# Patient Record
Sex: Female | Born: 1935 | Race: White | Hispanic: No | Smoking: Former smoker
Health system: Southern US, Community
[De-identification: ages and names within clinical notes are randomized; demographics above are authoritative.]

## PROBLEM LIST (undated history)

## (undated) ENCOUNTER — Ambulatory Visit: Admission: EM

## (undated) DIAGNOSIS — H269 Unspecified cataract: Secondary | ICD-10-CM

## (undated) DIAGNOSIS — G709 Myoneural disorder, unspecified: Secondary | ICD-10-CM

## (undated) DIAGNOSIS — G8929 Other chronic pain: Secondary | ICD-10-CM

## (undated) DIAGNOSIS — Z8719 Personal history of other diseases of the digestive system: Secondary | ICD-10-CM

## (undated) DIAGNOSIS — M549 Dorsalgia, unspecified: Secondary | ICD-10-CM

## (undated) DIAGNOSIS — J302 Other seasonal allergic rhinitis: Secondary | ICD-10-CM

## (undated) DIAGNOSIS — I878 Other specified disorders of veins: Secondary | ICD-10-CM

## (undated) DIAGNOSIS — R0600 Dyspnea, unspecified: Secondary | ICD-10-CM

## (undated) DIAGNOSIS — Z8711 Personal history of peptic ulcer disease: Secondary | ICD-10-CM

## (undated) DIAGNOSIS — R1310 Dysphagia, unspecified: Secondary | ICD-10-CM

## (undated) DIAGNOSIS — B379 Candidiasis, unspecified: Secondary | ICD-10-CM

## (undated) DIAGNOSIS — R609 Edema, unspecified: Secondary | ICD-10-CM

## (undated) DIAGNOSIS — R600 Localized edema: Secondary | ICD-10-CM

## (undated) DIAGNOSIS — E039 Hypothyroidism, unspecified: Secondary | ICD-10-CM

## (undated) DIAGNOSIS — I10 Essential (primary) hypertension: Secondary | ICD-10-CM

## (undated) DIAGNOSIS — IMO0001 Reserved for inherently not codable concepts without codable children: Secondary | ICD-10-CM

## (undated) DIAGNOSIS — J439 Emphysema, unspecified: Secondary | ICD-10-CM

## (undated) DIAGNOSIS — M62838 Other muscle spasm: Secondary | ICD-10-CM

## (undated) DIAGNOSIS — I499 Cardiac arrhythmia, unspecified: Secondary | ICD-10-CM

## (undated) DIAGNOSIS — J45909 Unspecified asthma, uncomplicated: Secondary | ICD-10-CM

## (undated) DIAGNOSIS — M2550 Pain in unspecified joint: Secondary | ICD-10-CM

## (undated) DIAGNOSIS — M47817 Spondylosis without myelopathy or radiculopathy, lumbosacral region: Secondary | ICD-10-CM

## (undated) DIAGNOSIS — E7800 Pure hypercholesterolemia, unspecified: Secondary | ICD-10-CM

## (undated) DIAGNOSIS — I4891 Unspecified atrial fibrillation: Secondary | ICD-10-CM

## (undated) DIAGNOSIS — K589 Irritable bowel syndrome without diarrhea: Secondary | ICD-10-CM

## (undated) DIAGNOSIS — H9190 Unspecified hearing loss, unspecified ear: Secondary | ICD-10-CM

## (undated) DIAGNOSIS — K219 Gastro-esophageal reflux disease without esophagitis: Secondary | ICD-10-CM

## (undated) DIAGNOSIS — Z9289 Personal history of other medical treatment: Secondary | ICD-10-CM

## (undated) DIAGNOSIS — C439 Malignant melanoma of skin, unspecified: Secondary | ICD-10-CM

## (undated) DIAGNOSIS — D860 Sarcoidosis of lung: Secondary | ICD-10-CM

## (undated) DIAGNOSIS — J189 Pneumonia, unspecified organism: Secondary | ICD-10-CM

## (undated) DIAGNOSIS — M1990 Unspecified osteoarthritis, unspecified site: Secondary | ICD-10-CM

## (undated) HISTORY — PX: TONSILLECTOMY: SUR1361

## (undated) HISTORY — DX: Essential (primary) hypertension: I10

## (undated) HISTORY — PX: CHOLECYSTECTOMY: SHX55

## (undated) HISTORY — DX: Unspecified osteoarthritis, unspecified site: M19.90

## (undated) HISTORY — PX: COLONOSCOPY: SHX174

## (undated) HISTORY — PX: MELANOMA EXCISION: SHX5266

## (undated) HISTORY — PX: ESOPHAGOGASTRODUODENOSCOPY: SHX1529

## (undated) HISTORY — DX: Pure hypercholesterolemia, unspecified: E78.00

## (undated) HISTORY — PX: TOTAL HIP ARTHROPLASTY: SHX124

## (undated) HISTORY — DX: Sarcoidosis of lung: D86.0

## (undated) HISTORY — DX: Spondylosis without myelopathy or radiculopathy, lumbosacral region: M47.817

## (undated) HISTORY — PX: ROTATOR CUFF REPAIR: SHX139

## (undated) HISTORY — PX: OTHER SURGICAL HISTORY: SHX169

## (undated) HISTORY — DX: Malignant melanoma of skin, unspecified: C43.9

## (undated) HISTORY — DX: Unspecified atrial fibrillation: I48.91

---

## 1980-11-03 HISTORY — PX: ABDOMINAL HYSTERECTOMY: SHX81

## 1984-11-03 HISTORY — PX: APPENDECTOMY: SHX54

## 1994-11-03 HISTORY — PX: ROTATOR CUFF REPAIR: SHX139

## 1995-11-04 HISTORY — PX: BREAST BIOPSY: SHX20

## 1999-08-01 ENCOUNTER — Other Ambulatory Visit: Admission: RE | Admit: 1999-08-01 | Discharge: 1999-08-01 | Payer: Self-pay | Admitting: Obstetrics and Gynecology

## 1999-10-04 HISTORY — PX: LIPOMA EXCISION: SHX5283

## 2000-06-29 ENCOUNTER — Encounter: Payer: Self-pay | Admitting: Orthopedic Surgery

## 2000-06-29 ENCOUNTER — Ambulatory Visit (HOSPITAL_COMMUNITY): Admission: RE | Admit: 2000-06-29 | Discharge: 2000-06-29 | Payer: Self-pay | Admitting: Orthopedic Surgery

## 2000-08-05 ENCOUNTER — Other Ambulatory Visit: Admission: RE | Admit: 2000-08-05 | Discharge: 2000-08-05 | Payer: Self-pay | Admitting: Obstetrics and Gynecology

## 2000-10-03 HISTORY — PX: TOTAL HIP ARTHROPLASTY: SHX124

## 2000-10-14 ENCOUNTER — Inpatient Hospital Stay (HOSPITAL_COMMUNITY): Admission: RE | Admit: 2000-10-14 | Discharge: 2000-10-19 | Payer: Self-pay | Admitting: Orthopedic Surgery

## 2000-10-14 ENCOUNTER — Encounter: Payer: Self-pay | Admitting: Orthopedic Surgery

## 2000-11-05 ENCOUNTER — Ambulatory Visit (HOSPITAL_COMMUNITY): Admission: RE | Admit: 2000-11-05 | Discharge: 2000-11-05 | Payer: Self-pay | Admitting: Orthopedic Surgery

## 2001-02-12 ENCOUNTER — Encounter: Payer: Self-pay | Admitting: Orthopedic Surgery

## 2001-02-12 ENCOUNTER — Encounter: Admission: RE | Admit: 2001-02-12 | Discharge: 2001-02-12 | Payer: Self-pay | Admitting: Orthopedic Surgery

## 2001-04-26 ENCOUNTER — Encounter: Payer: Self-pay | Admitting: Internal Medicine

## 2001-04-26 ENCOUNTER — Encounter: Admission: RE | Admit: 2001-04-26 | Discharge: 2001-04-26 | Payer: Self-pay | Admitting: Internal Medicine

## 2001-04-29 ENCOUNTER — Encounter: Payer: Self-pay | Admitting: Internal Medicine

## 2001-04-29 ENCOUNTER — Encounter: Admission: RE | Admit: 2001-04-29 | Discharge: 2001-04-29 | Payer: Self-pay | Admitting: Internal Medicine

## 2001-05-03 DIAGNOSIS — D860 Sarcoidosis of lung: Secondary | ICD-10-CM

## 2001-05-03 HISTORY — PX: LUNG BIOPSY: SHX232

## 2001-05-03 HISTORY — DX: Sarcoidosis of lung: D86.0

## 2001-05-07 ENCOUNTER — Encounter: Payer: Self-pay | Admitting: Thoracic Surgery

## 2001-05-07 ENCOUNTER — Encounter (INDEPENDENT_AMBULATORY_CARE_PROVIDER_SITE_OTHER): Payer: Self-pay | Admitting: Specialist

## 2001-05-07 ENCOUNTER — Ambulatory Visit (HOSPITAL_COMMUNITY): Admission: RE | Admit: 2001-05-07 | Discharge: 2001-05-07 | Payer: Self-pay | Admitting: Thoracic Surgery

## 2001-06-01 ENCOUNTER — Encounter: Admission: RE | Admit: 2001-06-01 | Discharge: 2001-06-01 | Payer: Self-pay | Admitting: Thoracic Surgery

## 2001-06-01 ENCOUNTER — Encounter: Payer: Self-pay | Admitting: Thoracic Surgery

## 2001-08-03 ENCOUNTER — Encounter: Payer: Self-pay | Admitting: Thoracic Surgery

## 2001-08-03 ENCOUNTER — Encounter: Admission: RE | Admit: 2001-08-03 | Discharge: 2001-08-03 | Payer: Self-pay | Admitting: Thoracic Surgery

## 2001-08-09 ENCOUNTER — Other Ambulatory Visit: Admission: RE | Admit: 2001-08-09 | Discharge: 2001-08-09 | Payer: Self-pay | Admitting: Obstetrics and Gynecology

## 2001-08-30 ENCOUNTER — Encounter: Admission: RE | Admit: 2001-08-30 | Discharge: 2001-08-30 | Payer: Self-pay | Admitting: Internal Medicine

## 2001-08-30 ENCOUNTER — Encounter: Payer: Self-pay | Admitting: Internal Medicine

## 2001-09-23 ENCOUNTER — Encounter: Admission: RE | Admit: 2001-09-23 | Discharge: 2001-09-23 | Payer: Self-pay | Admitting: Internal Medicine

## 2001-09-23 ENCOUNTER — Encounter: Payer: Self-pay | Admitting: Internal Medicine

## 2001-11-02 ENCOUNTER — Encounter: Admission: RE | Admit: 2001-11-02 | Discharge: 2001-11-02 | Payer: Self-pay | Admitting: Thoracic Surgery

## 2001-11-02 ENCOUNTER — Encounter: Payer: Self-pay | Admitting: Thoracic Surgery

## 2002-01-27 ENCOUNTER — Encounter: Admission: RE | Admit: 2002-01-27 | Discharge: 2002-01-27 | Payer: Self-pay | Admitting: Thoracic Surgery

## 2002-01-27 ENCOUNTER — Encounter: Payer: Self-pay | Admitting: Thoracic Surgery

## 2002-04-29 ENCOUNTER — Encounter: Payer: Self-pay | Admitting: Orthopedic Surgery

## 2002-04-29 ENCOUNTER — Encounter: Admission: RE | Admit: 2002-04-29 | Discharge: 2002-04-29 | Payer: Self-pay | Admitting: Orthopedic Surgery

## 2002-06-28 ENCOUNTER — Ambulatory Visit (HOSPITAL_COMMUNITY): Admission: RE | Admit: 2002-06-28 | Discharge: 2002-06-28 | Payer: Self-pay | Admitting: Orthopedic Surgery

## 2002-06-28 ENCOUNTER — Encounter: Payer: Self-pay | Admitting: Orthopedic Surgery

## 2003-03-29 ENCOUNTER — Ambulatory Visit (HOSPITAL_COMMUNITY): Admission: RE | Admit: 2003-03-29 | Discharge: 2003-03-29 | Payer: Self-pay | Admitting: Orthopedic Surgery

## 2003-03-29 ENCOUNTER — Encounter: Payer: Self-pay | Admitting: Orthopedic Surgery

## 2003-08-08 ENCOUNTER — Encounter: Payer: Self-pay | Admitting: Internal Medicine

## 2003-08-08 ENCOUNTER — Encounter: Admission: RE | Admit: 2003-08-08 | Discharge: 2003-08-08 | Payer: Self-pay | Admitting: Internal Medicine

## 2003-11-04 HISTORY — PX: OTHER SURGICAL HISTORY: SHX169

## 2003-12-13 ENCOUNTER — Encounter: Admission: RE | Admit: 2003-12-13 | Discharge: 2003-12-13 | Payer: Self-pay | Admitting: Orthopedic Surgery

## 2005-04-14 ENCOUNTER — Encounter: Admission: RE | Admit: 2005-04-14 | Discharge: 2005-04-14 | Payer: Self-pay | Admitting: Orthopedic Surgery

## 2005-06-03 HISTORY — PX: TOTAL HIP ARTHROPLASTY: SHX124

## 2005-06-27 ENCOUNTER — Inpatient Hospital Stay (HOSPITAL_COMMUNITY): Admission: RE | Admit: 2005-06-27 | Discharge: 2005-07-02 | Payer: Self-pay | Admitting: Orthopedic Surgery

## 2006-01-13 ENCOUNTER — Encounter: Admission: RE | Admit: 2006-01-13 | Discharge: 2006-01-13 | Payer: Self-pay | Admitting: Neurological Surgery

## 2006-07-31 ENCOUNTER — Encounter: Admission: RE | Admit: 2006-07-31 | Discharge: 2006-07-31 | Payer: Self-pay | Admitting: Surgery

## 2006-12-02 ENCOUNTER — Encounter: Admission: RE | Admit: 2006-12-02 | Discharge: 2006-12-02 | Payer: Self-pay | Admitting: Orthopedic Surgery

## 2007-07-28 ENCOUNTER — Encounter: Admission: RE | Admit: 2007-07-28 | Discharge: 2007-07-28 | Payer: Self-pay | Admitting: Orthopedic Surgery

## 2008-03-17 ENCOUNTER — Encounter
Admission: RE | Admit: 2008-03-17 | Discharge: 2008-06-15 | Payer: Self-pay | Admitting: Physical Medicine & Rehabilitation

## 2008-03-21 ENCOUNTER — Ambulatory Visit: Payer: Self-pay | Admitting: Physical Medicine & Rehabilitation

## 2008-04-13 ENCOUNTER — Ambulatory Visit: Payer: Self-pay | Admitting: Physical Medicine & Rehabilitation

## 2008-05-11 ENCOUNTER — Ambulatory Visit: Payer: Self-pay | Admitting: Physical Medicine & Rehabilitation

## 2008-06-14 ENCOUNTER — Encounter
Admission: RE | Admit: 2008-06-14 | Discharge: 2008-09-12 | Payer: Self-pay | Admitting: Physical Medicine & Rehabilitation

## 2008-06-15 ENCOUNTER — Ambulatory Visit: Payer: Self-pay | Admitting: Physical Medicine & Rehabilitation

## 2008-07-13 ENCOUNTER — Ambulatory Visit: Payer: Self-pay | Admitting: Physical Medicine & Rehabilitation

## 2008-08-08 ENCOUNTER — Ambulatory Visit: Payer: Self-pay | Admitting: Physical Medicine & Rehabilitation

## 2008-09-05 ENCOUNTER — Ambulatory Visit: Payer: Self-pay | Admitting: Physical Medicine & Rehabilitation

## 2008-10-09 ENCOUNTER — Encounter
Admission: RE | Admit: 2008-10-09 | Discharge: 2009-01-07 | Payer: Self-pay | Admitting: Physical Medicine & Rehabilitation

## 2008-10-10 ENCOUNTER — Ambulatory Visit: Payer: Self-pay | Admitting: Physical Medicine & Rehabilitation

## 2008-11-07 ENCOUNTER — Ambulatory Visit: Payer: Self-pay | Admitting: Physical Medicine & Rehabilitation

## 2008-12-05 ENCOUNTER — Ambulatory Visit: Payer: Self-pay | Admitting: Physical Medicine & Rehabilitation

## 2009-01-08 ENCOUNTER — Encounter
Admission: RE | Admit: 2009-01-08 | Discharge: 2009-04-08 | Payer: Self-pay | Admitting: Physical Medicine & Rehabilitation

## 2009-01-09 ENCOUNTER — Ambulatory Visit: Payer: Self-pay | Admitting: Physical Medicine & Rehabilitation

## 2009-02-09 ENCOUNTER — Ambulatory Visit: Payer: Self-pay | Admitting: Physical Medicine & Rehabilitation

## 2009-03-05 ENCOUNTER — Ambulatory Visit: Payer: Self-pay | Admitting: Physical Medicine & Rehabilitation

## 2009-04-03 ENCOUNTER — Encounter
Admission: RE | Admit: 2009-04-03 | Discharge: 2009-07-02 | Payer: Self-pay | Admitting: Physical Medicine & Rehabilitation

## 2009-04-06 ENCOUNTER — Ambulatory Visit: Payer: Self-pay | Admitting: Physical Medicine & Rehabilitation

## 2009-05-17 ENCOUNTER — Ambulatory Visit: Payer: Self-pay | Admitting: Physical Medicine & Rehabilitation

## 2009-06-07 ENCOUNTER — Ambulatory Visit: Payer: Self-pay | Admitting: Physical Medicine & Rehabilitation

## 2009-07-06 ENCOUNTER — Encounter
Admission: RE | Admit: 2009-07-06 | Discharge: 2009-10-03 | Payer: Self-pay | Admitting: Physical Medicine & Rehabilitation

## 2009-07-10 ENCOUNTER — Ambulatory Visit: Payer: Self-pay | Admitting: Physical Medicine & Rehabilitation

## 2009-08-08 ENCOUNTER — Ambulatory Visit: Payer: Self-pay | Admitting: Physical Medicine & Rehabilitation

## 2009-09-05 ENCOUNTER — Ambulatory Visit: Payer: Self-pay | Admitting: Physical Medicine & Rehabilitation

## 2009-10-03 ENCOUNTER — Encounter
Admission: RE | Admit: 2009-10-03 | Discharge: 2009-10-31 | Payer: Self-pay | Admitting: Physical Medicine & Rehabilitation

## 2009-10-04 ENCOUNTER — Ambulatory Visit: Payer: Self-pay | Admitting: Physical Medicine & Rehabilitation

## 2009-10-31 ENCOUNTER — Ambulatory Visit: Payer: Self-pay | Admitting: Physical Medicine & Rehabilitation

## 2009-11-21 ENCOUNTER — Encounter
Admission: RE | Admit: 2009-11-21 | Discharge: 2010-02-19 | Payer: Self-pay | Admitting: Physical Medicine & Rehabilitation

## 2009-11-28 ENCOUNTER — Ambulatory Visit: Payer: Self-pay | Admitting: Physical Medicine & Rehabilitation

## 2009-12-27 ENCOUNTER — Ambulatory Visit: Payer: Self-pay | Admitting: Physical Medicine & Rehabilitation

## 2010-01-28 ENCOUNTER — Ambulatory Visit: Payer: Self-pay | Admitting: Physical Medicine & Rehabilitation

## 2010-02-20 ENCOUNTER — Encounter
Admission: RE | Admit: 2010-02-20 | Discharge: 2010-05-21 | Payer: Self-pay | Admitting: Physical Medicine & Rehabilitation

## 2010-02-27 ENCOUNTER — Ambulatory Visit: Payer: Self-pay | Admitting: Physical Medicine & Rehabilitation

## 2010-03-27 ENCOUNTER — Ambulatory Visit: Payer: Self-pay | Admitting: Physical Medicine & Rehabilitation

## 2010-04-12 ENCOUNTER — Ambulatory Visit: Payer: Self-pay | Admitting: Physical Medicine & Rehabilitation

## 2010-05-07 ENCOUNTER — Ambulatory Visit: Payer: Self-pay | Admitting: Physical Medicine & Rehabilitation

## 2010-05-15 ENCOUNTER — Encounter
Admission: RE | Admit: 2010-05-15 | Discharge: 2010-05-15 | Payer: Self-pay | Admitting: Physical Medicine & Rehabilitation

## 2010-06-05 ENCOUNTER — Encounter
Admission: RE | Admit: 2010-06-05 | Discharge: 2010-09-03 | Payer: Self-pay | Admitting: Physical Medicine & Rehabilitation

## 2010-06-10 ENCOUNTER — Ambulatory Visit: Payer: Self-pay | Admitting: Physical Medicine & Rehabilitation

## 2010-07-12 ENCOUNTER — Ambulatory Visit: Payer: Self-pay | Admitting: Physical Medicine & Rehabilitation

## 2010-08-12 ENCOUNTER — Ambulatory Visit: Payer: Self-pay | Admitting: Physical Medicine & Rehabilitation

## 2010-09-18 ENCOUNTER — Encounter
Admission: RE | Admit: 2010-09-18 | Discharge: 2010-10-31 | Payer: Self-pay | Source: Home / Self Care | Attending: Physical Medicine & Rehabilitation | Admitting: Physical Medicine & Rehabilitation

## 2010-09-24 ENCOUNTER — Ambulatory Visit: Payer: Self-pay | Admitting: Physical Medicine & Rehabilitation

## 2010-11-06 ENCOUNTER — Encounter
Admission: RE | Admit: 2010-11-06 | Discharge: 2010-12-03 | Payer: Self-pay | Source: Home / Self Care | Attending: Physical Medicine & Rehabilitation | Admitting: Physical Medicine & Rehabilitation

## 2010-11-08 ENCOUNTER — Ambulatory Visit
Admission: RE | Admit: 2010-11-08 | Discharge: 2010-11-08 | Payer: Self-pay | Source: Home / Self Care | Attending: Physical Medicine & Rehabilitation | Admitting: Physical Medicine & Rehabilitation

## 2010-12-02 ENCOUNTER — Ambulatory Visit
Admission: RE | Admit: 2010-12-02 | Discharge: 2010-12-02 | Payer: Self-pay | Source: Home / Self Care | Attending: Physical Medicine & Rehabilitation | Admitting: Physical Medicine & Rehabilitation

## 2010-12-31 ENCOUNTER — Encounter: Payer: MEDICARE | Attending: Physical Medicine & Rehabilitation

## 2010-12-31 ENCOUNTER — Ambulatory Visit (HOSPITAL_BASED_OUTPATIENT_CLINIC_OR_DEPARTMENT_OTHER): Payer: MEDICARE | Admitting: Physical Medicine & Rehabilitation

## 2010-12-31 DIAGNOSIS — M67919 Unspecified disorder of synovium and tendon, unspecified shoulder: Secondary | ICD-10-CM | POA: Insufficient documentation

## 2010-12-31 DIAGNOSIS — IMO0002 Reserved for concepts with insufficient information to code with codable children: Secondary | ICD-10-CM | POA: Insufficient documentation

## 2010-12-31 DIAGNOSIS — M47817 Spondylosis without myelopathy or radiculopathy, lumbosacral region: Secondary | ICD-10-CM | POA: Insufficient documentation

## 2010-12-31 DIAGNOSIS — M25519 Pain in unspecified shoulder: Secondary | ICD-10-CM | POA: Insufficient documentation

## 2010-12-31 DIAGNOSIS — M719 Bursopathy, unspecified: Secondary | ICD-10-CM

## 2010-12-31 DIAGNOSIS — M48061 Spinal stenosis, lumbar region without neurogenic claudication: Secondary | ICD-10-CM

## 2011-02-13 ENCOUNTER — Encounter: Payer: MEDICARE | Attending: Physical Medicine & Rehabilitation

## 2011-02-13 ENCOUNTER — Ambulatory Visit (HOSPITAL_BASED_OUTPATIENT_CLINIC_OR_DEPARTMENT_OTHER): Payer: MEDICARE | Admitting: Physical Medicine & Rehabilitation

## 2011-02-13 DIAGNOSIS — M67919 Unspecified disorder of synovium and tendon, unspecified shoulder: Secondary | ICD-10-CM | POA: Insufficient documentation

## 2011-02-13 DIAGNOSIS — M719 Bursopathy, unspecified: Secondary | ICD-10-CM | POA: Insufficient documentation

## 2011-02-13 DIAGNOSIS — M47817 Spondylosis without myelopathy or radiculopathy, lumbosacral region: Secondary | ICD-10-CM | POA: Insufficient documentation

## 2011-02-13 DIAGNOSIS — M25519 Pain in unspecified shoulder: Secondary | ICD-10-CM | POA: Insufficient documentation

## 2011-02-13 DIAGNOSIS — IMO0002 Reserved for concepts with insufficient information to code with codable children: Secondary | ICD-10-CM | POA: Insufficient documentation

## 2011-02-13 DIAGNOSIS — M19029 Primary osteoarthritis, unspecified elbow: Secondary | ICD-10-CM

## 2011-02-27 NOTE — Procedures (Signed)
NAMEBRIZZA, Jenna [name]              ACCOUNT NO.:  [id number]  MEDICAL RECORD NO.:  [id number]           PATIENT TYPE:  O  LOCATION:  [facility]                          FACILITY:  [facility]  PHYSICIAN:  Erick Colace, M.D.DATE OF BIRTH:  02/25/1933  DATE OF PROCEDURE: DATE OF DISCHARGE:                              OPERATIVE REPORT  PROCEDURE:  Left intra-articular injection under ultrasound guidance.  INDICATION:  Left shoulder OA.  She has a history of multiple left shoulder procedures with prior rotator cuff repair as well as acromioclavicular resection.  Her pain persists despite medication management including narcotic analgesics.  She has had variable success with shoulder injections in the past.  Informed consent was obtained after describing risks and benefits of the procedure with the patient.  These include bleeding, bruising and infection.  She elects to proceed and has given written consent.  She placed in a seated position.  Posterior lateral approach utilized. Area inferior to the spinous scapula was scanned laterally.  Glenohumeral joint identified.  Area marked and prepped with chlorhexidine.  Then entered with a 22-gauge echo block needle under continuous ultrasound guidance, 1% lidocaine infiltrated into the skin and subcu tissues in route to joint space.  Once needle approximated joint in a oblique view, 1 mL of 40 mg/mL Depo-Medrol and 4 mL of 1% lidocaine were injected. The patient tolerated the procedure well.  Postprocedure instructions given and she will follow up in 3 weeks.     Erick Colace, M.D. Electronically Signed    AEK/MEDQ  D:  02/13/2011 13:17:37  T:  02/14/2011 00:23:56  Job:  [id number]  cc:   Dyke Brackett, M.D. Fax: [phone]

## 2011-03-18 ENCOUNTER — Ambulatory Visit: Payer: MEDICARE | Admitting: Physical Medicine & Rehabilitation

## 2011-03-18 ENCOUNTER — Encounter: Payer: MEDICARE | Attending: Physical Medicine & Rehabilitation

## 2011-03-18 DIAGNOSIS — G8928 Other chronic postprocedural pain: Secondary | ICD-10-CM

## 2011-03-18 DIAGNOSIS — M67919 Unspecified disorder of synovium and tendon, unspecified shoulder: Secondary | ICD-10-CM | POA: Insufficient documentation

## 2011-03-18 DIAGNOSIS — M719 Bursopathy, unspecified: Secondary | ICD-10-CM | POA: Insufficient documentation

## 2011-03-18 DIAGNOSIS — M48061 Spinal stenosis, lumbar region without neurogenic claudication: Secondary | ICD-10-CM

## 2011-03-18 DIAGNOSIS — M25519 Pain in unspecified shoulder: Secondary | ICD-10-CM | POA: Insufficient documentation

## 2011-03-18 DIAGNOSIS — M47817 Spondylosis without myelopathy or radiculopathy, lumbosacral region: Secondary | ICD-10-CM | POA: Insufficient documentation

## 2011-03-18 DIAGNOSIS — IMO0002 Reserved for concepts with insufficient information to code with codable children: Secondary | ICD-10-CM | POA: Insufficient documentation

## 2011-03-18 NOTE — Assessment & Plan Note (Signed)
A 77 year old female last seen by me on August 08, 2008.  She has a  history of lumbar spondylosis without myelopathy with facet syndrome in  the lumbar area.  She had extended relief after bilateral lumbar medial  branch blocks.  She has improved with her overall activity tolerance.  She has had pain around 4/10 for the most part over the last month, but  had a couple of spikes in the 6/10 range  about 2 days, where she  attributes it to weather.  She is independent with all her self-care and  mobility.  Her Oswestry disability index today was 28%.   Her pain level with activity is around 3/10.  In terms of interference  scores, I believe it remains good.  She can walk 20 minutes at a time.  She can climb steps.  She drives.  She is retired.   PHYSICAL EXAMINATION:  VITAL SIGNS:  Her blood pressure is 129/56, pulse  82, respiratory rate 20, O2 sat 98% on room air.  GENERAL:  Well-developed, well-nourished female in no acute distress.  Orientation x3.  Affect is alert.  Gait is normal.  EXTREMITIES:  Without edema.  She has normal strength in bilateral hip  flexion, knee extension, and ankle dorsiflexion.  She has good range of  motion in bilateral lower extremities.  Deep tendon reflexes are 1+ in  bilateral lower extremities.  She has some mild tenderness to palpation  of lumbar paraspinals.  She has good lumbar range of motion.  Forward  flexion and extension gets to about 50% range and mild pain with  extension end range.   IMPRESSION:  1. Lumbar spondylosis without myelopathy.  She has a history of      prolonged efficacy with medial branch blocks.  She has been on a      stable dose of Demerol 50 mg t.i.d. without signs of narcotic abuse      or misuse.  Her activity level has improved overall after      treatment.   PLAN:  We will go ahead and see her back in another month.  Difficult to  predict how long the medical branch blocks will work, they are actually  exceeding usual  duration of the fact.   Monitor signs and symptoms of substance abuse or misuse.      Erick Colace, M.D.  Electronically Signed     AEK/MedQ  D:  09/05/2008 13:40:55  T:  09/06/2008 02:15:58  Job #:  [id number]   cc:   Dyke Brackett, M.D.  Fax: [phone]

## 2011-03-18 NOTE — Procedures (Signed)
Jenna [name], Jenna [name]              ACCOUNT NO.:  [id number]   MEDICAL RECORD NO.:  [id number]          PATIENT TYPE:  REC   LOCATION:  [facility]                          FACILITY:  [facility]   PHYSICIAN:  Erick Colace, M.D.DATE OF BIRTH:  Jul 04, 1933   DATE OF PROCEDURE:  05/11/2008  DATE OF DISCHARGE:                               OPERATIVE REPORT   PROCEDURE:  Bilateral L5 dorsal ramus injection, bilateral L4 medial  branch block, and bilateral L3 medial branch block under lumbar and  sacral fluoroscopic guidance.   INDICATION:  Lumbar spondylosis without myelopathy, axial back pain  which is only partially responsive to narcotic analgesics and limits her  self-care and mobility.   Last injection April 13, 2008, gave a 100% relief of pain x1 week.  Pain  does increase during the day from a moderate level to a moderately  severe level.   Informed consent obtained after describing risks and benefits of the  procedure with the patient include bleeding, bruising, infection, loss  of bowel or bladder function, temporary or permanent paralysis.  She  elects to proceed and has given written consent.  The patient placed  prone on fluoroscopy table.  Betadine prep, sterile drape, 25-gauge a  inch and half needle was used to anesthetize the skin and subcu tissue  with 1% lidocaine x2 mL at each of 6 sites.  Then, a 22-gauge 3.5 inch  spinal needle was inserted first targeting the left S1, SAP, sacral ala  junction.  Bone contact made confirmed with lateral imaging.  Omnipaque  180 x 0.5 mL demonstrated no intravascular uptake and 0.5 mL solution  containing 1 mL of 4 mg/mL dexamethasone and 2 mL of 2% MPF lidocaine.  In the left L5 transverse process, SAP junction targeted, bone contact  made confirmed with lateral imaging.  Omnipaque 180 x 0.5 mL  demonstrated no intravascular uptake and 0.5 mL of the dexamethasone  lidocaine solution was injected.  Then, the left L4 SAP transverse  process  junction targeted, bone contact made, confirmed with lateral  imaging.  Omnipaque 180 x 0.5 mL demonstrated no intravascular uptake  and 0.5 mL of dexamethasone lidocaine solution was injected.  The same  procedure was repeated at the corresponding levels on the right side  using the same needle, injection technique, and injection solution.  The  patient tolerated this procedure well.  Pre- and post-injection vital  stable.  Postinjection instructions given.  We will schedule  radiofrequency neurotomy pending results of medial branch block.  Prescription written for Demerol, Phenergan, as well as some p.r.n.  Flexeril.      Erick Colace, M.D.  Electronically Signed     AEK/MEDQ  D:  05/11/2008 13:53:36  T:  05/12/2008 02:39:43  Job:  [id number]

## 2011-03-18 NOTE — Assessment & Plan Note (Signed)
A 77 year old female last seen on July 13, 2008, history of lumbar  spondylosis without myelopathy with facet syndrome in the lumbar area.  She is having extended relief after bilateral L5 dorsal ramus injection  and bilateral L4 medial branch block and bilateral L3 medial branch  block under fluoroscopic guidance.  She has improved overall in her  activity tolerance.  She is having a bad day today.  Her usual pain is  in the 2-3 range, the worst she has had in the last month was 4/10.  She  is going on a treadmill 30-40 minutes at a time.  She is independent  with all of her self-care and mobility.   She is following up with Dr. Madelon Lips in regards to left shoulder pain.   VITAL SIGNS:  Her blood pressure is 145/66, pulse 84, respirations 20,  and O2 sat 98% on room air.  GENERAL:  No acute distress.  Orientation x3.  Affect is alert.  Gait is  normal.   She has tenderness to palpation bilateral L4 and L5 paraspinal muscles.  She has good forward flexion, but extension causes pain in the lumbar  area.  She has normal sensation and normal lower extremity deep tendon  reflexes.   Gait is normal.   IMPRESSION:  Lumbar spondylosis, myelopathy, prolonged response to  medial branch block about 3 months post injection.  I would imagine that  it starting wearing off soon.  I will see her back in another month or  so, monitor for signs of recurrence.      Erick Colace, M.D.  Electronically Signed     AEK/MedQ  D:  08/08/2008 14:31:06  T:  08/09/2008 03:41:49  Job #:  [id number]   cc:   Dyke Brackett, M.D.  Fax: [phone]

## 2011-03-18 NOTE — Assessment & Plan Note (Signed)
A 77 year old female last seen by me on September 05, 2008, with history  of lumbar spondylosis without myelopathy with facet syndrome of the  lumbar area.  She had extended relief after bilateral lumbar medial  branch blocks last performed in July 2009.  She has had some headache  pain as well as some neck pain on the right side.  She has some pain  when she tries to sleep.  Overall, she has charted her pain scores and  really staying in the 4-5/10 range and some days in the 3/10 range.   CURRENT MEDICATIONS:  Tramadol 50 mg p.o. t.i.d.   She has no signs of narcotic abuse or misuse.  The level of activity has  improved after treatment.   Her pain has flared up a bit with prolonged waking.  She has had some  left shoulder pain and has recently undergone shoulder injection per Dr.  Madelon Lips.  She is able to climb steps.  She is able to drive.   REVIEW OF SYSTEMS:  Otherwise negative.  She has no numbness or tingling  in the hands.  She has no bowel or bladder dysfunction.   PHYSICAL EXAMINATION:  VITAL SIGNS:  Blood pressure 132/80, pulse 82,  respiratory rate 18, and O2 sats 100% on room air.  GENERAL:  In no acute distress.  Orientation x3.  Affect is alert.  Gait  is normal.  EXTREMITIES:  Without edema.  Deep tendon reflexes are normal in the  upper extremities.  Normal strength in the upper extremities.  NECK:  Range of motion 75% range forward flexion and extension, 30%  range lateral bending and rotation.   She has mild tenderness to palpation of the cervical paraspinals  posteriorly.  Lumbar range of motion, there is mild tenderness above the  lumbosacral junction.   Lower extremity strength is normal.  Gait is normal.  Lumbar spine range  of motion is good in forward flexion, but reduced in extension to 25%  range.   IMPRESSION:  1. Lumbar spondylosis without myelopathy.  Prolonged effect of medial      branch blocks.  No need for re-injection at this point.  2. Neck  pain.  No signs of radicular component.  Continue Flexeril for      this on an intermittent basis.  Should this progress or cause more      functional disturbance, consider MRI and possible cervical medial      branch blocks.   Discussed with the patient and she is in agreement with this plan.  I  have written the prescription for her Demerol 30 mg p.o. t.i.d., #30,  for 68-month supply.  I will see her back in 1 month.      Erick Colace, M.D.  Electronically Signed     AEK/MedQ  D:  10/10/2008 13:32:56  T:  10/11/2008 01:03:02  Job #:  [id number]   cc:   Dyke Brackett, M.D.  Fax: [phone]

## 2011-03-18 NOTE — Assessment & Plan Note (Signed)
HISTORY OF PRESENT ILLNESS:  [name] follows up today.  She is here for  back pain.  She has had some recurrence of her back pain and thigh pain,  feels like her lumbar medial branch blocks are finally wearing off.  They were performed in July 2009.  She has had some left shoulder pain,  is considering some surgery, we will discuss this with Dr. Madelon Lips.   Her current pain meds include Demerol 50 p.o. t.i.d.  She takes this in  conjunction with Phenergan 25 t.i.d.   She has had no new medical problems in the interval time.  Her pain is  in the 5-6/10 range.  Her sleep is good.  Pain is worse with walking,  standing, improves with rest and medications.  Relief meds is fair.  She  can walk 20-30 minutes at a time.  She climbs steps.  She drives.   PHYSICAL EXAMINATION:  VITAL SIGNS:  Blood pressure 138/64, pulse 87,  respirations 18, O2 sat 98% room air.  GENERAL:  In no acute stress.  Orientation x3.  Affect is bright.  Gait  is normal.   IMPRESSION:  Lumbar spondylosis without myelopathy causing chronic pain,  having recurrence after a prolonged effect from medial branch block.  We  will schedule for repeat medial branch block.  Continue Demerol 50  t.i.d.      Erick Colace, M.D.  Electronically Signed     AEK/MedQ  D:  02/09/2009 17:17:01  T:  02/10/2009 08:12:52  Job #:  [id number]   cc:   Dyke Brackett, M.D.  Fax: [phone]

## 2011-03-18 NOTE — Assessment & Plan Note (Signed)
Ms. Galano follows up today.  She is here for back pain.  She has a  history of this.  She has no evidence of radicular component.  She has  had a prolonged effect after lumbar medial branch blocks performed in  July 2009.  She has had no change in her status other than some  exacerbation of pain in the similar area due to having to clean up her  mother-in-law's apartment after her mother-in-law passed away.  Her  average pain is 4/10, currently 2.  She can walk 30-40 minutes at a  time, climb steps and drives.   Current pain medicines include Demerol 50 t.i.d., which she has been on  without signs of abuse or misuse.   Her blood pressure is 136/78, pulse 80, respirations 18, and O2 sat 99%  on room air.  Orientation x3.  Affect bright.  Gait is normal.   Her back has some tenderness to palpation, but this is more in the T7-T8  region in the paraspinal muscles mainly on the left side.  Her lower  extremity strength is normal.  Deep tendon reflexes are normal.  Lower  extremity range of motion is normal.   IMPRESSION:  1. Lumbosacral spondylosis without myelopathy.  2. Left parascapular myofascial pain syndrome.   PLAN:  1. We will continue Demerol.  2. We will hold off on further injections, but we will reassess in 1      month given that I would expect her medial branch block started      wearing off.  3. Instructed in Thera-Band exercises for the upper extremity scapular      to stabilize the muscles.      Erick Colace, M.D.  Electronically Signed     AEK/MedQ  D:  01/09/2009 16:25:53  T:  01/10/2009 03:09:03  Job #:  [id number]

## 2011-03-18 NOTE — Assessment & Plan Note (Signed)
Jenna [name] returns today.  She underwent bilateral L5 dorsal ramus  injection, bilateral L4 medial branch block, bilateral L3 medial branch  block under fluoroscopic guidance for her lumbar facet mediated pain.  She had partial relief with this set of injections.  However, still  feels stiff in her low back.  Last year when she underwent injections,  she had monthly x2 and this resulted in a 7-8 months duration of effect.  Her average pain is about 4.  She can walk 30-40 minutes at a time.  She  climbs steps.  She drives.  She is retired.  Her 4/10 pain relief is  with medication.   Her current pain meds; Demerol 50 t.i.d. which she takes with Phenergan  25 b.i.d.   PHYSICAL EXAMINATION:  VITAL SIGNS:  Blood pressure 155/80, pulse 90,  respirations 16, and O2 sat 98% on room air.  GENERAL:  In no acute stress.  Mood and affect appropriate.  BACK:  Mild tenderness to palpation in lumbar paraspinals.  She has good  forward flexion, however, extension is limited to about 50% with normal  range accompanied by pain with extension.  Lower extremity strength and  range of motion are normal.  Deep tendon reflexes in lower extremities  are normal as is sensation.   IMPRESSION:  Lumbosacral spondylosis without myelopathy.  She has had  partial relief with medial branch blocks.  We will repeat x1.  She has  had a long duration of effect last year with a repeat injection.  Should  she not respond that she did last year, I would recommend radiofrequency  neurotomy first on the right than on the left side.      Erick Colace, M.D.  Electronically Signed     AEK/MedQ  D:  04/06/2009 15:21:27  T:  04/07/2009 09:25:58  Job #:  [id number]   cc:   Dyke Brackett, M.D.  Fax: [phone]

## 2011-03-18 NOTE — Assessment & Plan Note (Signed)
Ms. Delgrande is a 77 year old female who I last saw on June 15, 2008.  She  has a history of lumbar spondylosis without myelopathy.  She has a facet  syndrome in the lumbar area with relief that has been rather extended  after bilateral L5 dorsal ramus injections, bilateral L4 medial branch  block, and bilateral L3 medial branch block under fluoroscopic guidance.  Her last injection was done on May 11, 2008, and she has had continued  relief.  She has gone back to playing some golf, although this does  aggravate her pain.   Her medication usage is somewhat less than 3 times per day on the  Demerol 50 mg.   Her Oswestry disability index today is 26% which is in the moderate  disability range.   Her walking tolerance is 30-45 minutes.   PHYSICAL EXAMINATION:  GENERAL:  No acute distress.  Mood and affect  appropriate.  VITAL SIGNS:  Blood pressure 147/73, pulse 80, respiratory rate 18, and  O2 sat 98% on room air.  Her deep tendon reflexes are normal except for decreased left S1.  Sensory exam is normal in bilateral lower extremities.  Range of motion  is good in bilateral hips, knees, and ankles.  EXTREMITIES:  Without edema.   IMPRESSION:  Lumbar spondylosis without myelopathy, prolonged response  to medial branch block.  She is about 2 months since her last test.  We  will see her back in a month and monitor relief cycling.  We can  certainly repeat this again if need be.  Should she have increasing  lower extremity pain or sciatic type discomfort, we cannot repeat  epidural steroid injection.      Erick Colace, M.D.  Electronically Signed     AEK/MedQ  D:  07/13/2008 14:06:19  T:  07/14/2008 00:30:19  Job #:  [id number]   cc:   Dyke Brackett, M.D.  Fax: [phone]

## 2011-03-18 NOTE — Procedures (Signed)
Jenna [name], Jenna [name]              ACCOUNT NO.:  [id number]   MEDICAL RECORD NO.:  [id number]          PATIENT TYPE:  REC   LOCATION:  [facility]                          FACILITY:  [facility]   PHYSICIAN:  Erick Colace, M.D.DATE OF BIRTH:  [birth year]   DATE OF PROCEDURE:  03/05/2009  DATE OF DISCHARGE:                               OPERATIVE REPORT   This is bilateral L5 dorsal ramus injection, bilateral L4 medial branch  block, bilateral L3 medial branch block under fluoroscopic guidance.   INDICATIONS:  Lumbar facet mediated pain.  She has had good results with  prior medial branch blocks.  She has had long-lasting relief as well.  Her pain is only partially responsive to medication management including  narcotic analgesics and interferes with self-care.   Informed consent was obtained after describing risks and benefits of the  procedure with the patient.  These include bleeding, bruising,  infection.  She elects to proceed and has given written consent.  The  patient placed prone on fluoroscopy table.  Betadine prep, sterile drape  25-gauge inch and half needle was used to anesthetize the skin and  subcutaneous tissue 1% lidocaine x2 mL at each of six sites.  Then a 22  gauge 3.5 inch spinal needle was inserted under fluoroscopic guidance  first starting left S1 SAP sacral junction, bone contact made, confirmed  with lateral imaging.  Omnipaque 180 x 0.5 mL demonstrated no  intravascular uptake, then 0.5 mL of solution containing 1 mL 4 mg/mL  dexamethasone and 2 mL 2% MPF lidocaine were injected and left L5 SAP  transverse process junction targeted, bone contact made, confirmed with  lateral imaging.  Omnipaque 180 under live fluoro demonstrated no  intravascular uptake and 0.5 mL dexamethasone lidocaine solution was  injected at the left L4 SAP transverse process junction targeted, bone  contact made, confirmed with lateral imaging.  Omnipaque 180 under live  fluoro demonstrated  no intravascular uptake then 0.5 mL of dexamethasone  lidocaine solution was injected.  The same procedure was repeated on the  right side.  The same needle was injected and technique.  The patient  tolerated the procedure well.  Pre and post injection vitals stable.  Post injection instructions given.      Erick Colace, M.D.  Electronically Signed     AEK/MEDQ  D:  03/05/2009 17:16:16  T:  03/06/2009 07:23:16  Job:  [id number]

## 2011-03-18 NOTE — Group Therapy Note (Signed)
REQUESTING PHYSICIAN:  Dr. Madelon Lips.   REASON FOR CONSULTATION:  Left shoulder pain.   CHIEF COMPLAINT:  Back pain greater than shoulder pain greater than neck  pain.   HISTORY:  Jenna [name] is a pleasant 77 year old female who is a retired  Engineer, civil (consulting) who has had chronic pain in her shoulders as well as her low back  area. She has had orthopedic care and multiple operative procedures for  problems related to her rotator cuff. Despite interventions, she has had  persistent pain left greater than right. She has had hip pain as well,  but this has been well managed with bilateral total hip replacement. In  addition, she has had back pain, insidious onset, that she has had  evaluated by neurosurgery. She has had lumbar injections as well in  2005. She had a synovial cyst right L5-S1 facet. She had a MRI revealing  multi-level degenerative disease as well as advanced osteoarthritis, L5-  S1. She has had attempts at aspiration of the cyst. In terms of her  medications, she has multiple drug allergies and really has just  tolerated Demerol. She has been getting this through Dr. Candise Bowens  office. However, there was an episode where she had been getting some  through a dentist, Dr. Orvan Falconer and Dr. Halina Andreas earlier this year.   In terms of her shoulder pain, she has been given subdeltoid injections  every four months or so. She rates her average pain as 5/10, sharp,  intermittent aching activity at a moderate level. Her back and leg are  worse during the daytime. When she means by leg is actually bilateral  thighs. Shoulder problems bother her more at night when she tries to  sleep and affect her when she does not take her Demerol. Her sleep is  overall good with medications. Inactivity tends to aggravate some of her  back pain. Some extensor exercises have also been painful for her. She  has done well with pacer her activities, heat for the shoulder as well  as her medications, and resting for her  back. She can walk 30-40 minutes  on the treadmill. She climbs steps. She drives. She was last employed in  1984.   Her past surgical history:  1. Left rotator cuff repair October 09, 1980.  2. Arthroscopy left shoulder February 18, 1995.  3. Right rotator cuff repair July 29, 1995.  4. Left rotator cuff repair October 1998.  5. Left total hip October 09, 2008.  6. Total abdominal hysterectomy April 25, 1981.  7. Cholecystectomy and appendectomy June 04, 1985.   SOCIAL HISTORY:  Married to her husband, Mayford Knife. She drinks one glass  of wine with dinner.   FAMILY HISTORY:  Is heart disease, diabetes, high blood pressure.   CURRENT MEDICATIONS:  Include:  1. Levothyroxine.  2. Maxzide.  3. Protonix.  4. Singulair.  5. Calcium.  6. Multivitamin.  7. Aspirin 81 mg.  8. Vitamin D.  9. Estradiol patch.  10.Flonase.  11.Zyrtec.  12.Levsin.  13.Demerol 50 t.i.d.  14.Over-the-counter ibuprofen 200 mg that she takes up to 3 times a      day.   DRUG ALLERGIES:  INCLUDE CODEINE, DARVON, TYLENOL, VICODIN, ULTRAM, ALL  CAUSING SHORTNESS OF BREATH, WEAKNESS AND NAUSEA. DECONGESTANTS SUCH AS  SUDAFED CAUSE NERVOUSNESS. DALMANE CAUSED CONFUSION AND DISORIENTATION.  VIBRAMYCIN CAUSED A RASH. BIAXIN AND KEFLEX CAUSED GI UPSET.   Blood pressure 147/68, pulse 90, respirations 18, O2 saturation 97% on  room air.  Well-developed, well-nourished, elderly  female in no acute distress.  Orientation x3. Affect is bright and alert.  Gait is normal. She has approximately 90% of her shoulder abduction in  forward flexion, only some end-rage limitations. Her motor strength is  5/5 bilateral deltoid, biceps, triceps, grips, as well as hip flexors,  knee extensors, ankle dorsi flexors. Her deep tendon reflexes are normal  bilateral biceps, triceps, brachialis radialis, knee and ankle. Her  coordination is normal. Her extremities show no evidence of edema. She  has good peripheral pulses. Her neck  range of motion is normal. Spine  range of motion is reduced with extension to about 25% range, forward  flexion is 50% range, extension is more painful than flexion. Her back  has no evidence of scoliosis. She has normal tone and range of motion  bilateral upper and lower extremities. No tenderness to palpation  bilateral upper or lower extremities. She does have decreased sensation  left axillary nerve dermatome corresponding to the C6 spinal nerve root  level. Left side only.  The remainder of dermatomes in the upper and  lower extremities are normal.   REVIEW OF IMAGING STUDIES:  MRI last done, left shoulder:  Artifact  limited exam, possible full thickness insertional distal supraspinatus  tear with fatty atrophy. However, the patient does have a negative drop-  arm test. She had severe glenohumeral and AC joint osteoarthritis noted  as well as a subscapularis strain; this was performed July 28, 2007. On December 02, 2006, she had MRI of the right shoulder showing no  rotator cuff tear, chronic avulsion fracture of right greater tuberosity  - mildly displaced, moderately advanced glenohumeral and AC joint  degenerative changes. MRI of the lumbar spine dated January 13, 2006  revealed anterolisthesis L5 on S1 0.6 cm, broad-based disk bulge L2-3 on  the right, facet arthropathy, small facet joint effusion at this level,  mild narrowing at the right L2-3 foramen, left foramen open at L3-4.  There is advanced facet arthropathy, broad-based disk bulge, ligamentum  flavum thickening, facet arthropathy, bulging disk showing moderate to  severe right foraminal narrowing but no significant change compared to  prior study performed December 13, 2003. She also has anterolisthesis  with bulge at L4-5, severe facet arthropathy, moderate central canal  stenosis at L5-S1, severe facet arthropathy, ligamentum flavum  thickening with moderate severe central canal stenosis.   IMPRESSION:  1. Left  shoulder pain, multifactorial. She does have chronic rotator      cuff changes. In addition, she has some numbness over that area,      question whether she may have a neuropathic component. This could      be due to some cervical spinal stenosis versus some more of an      axillary neuropathy associated with multiple surgical procedures.      In either case, I think it is reasonable to trial Lyrica. She had      tried some Neurontin before but had some side effects with this.      Will start a very small dose. Need to monitor.  2. In terms of her low back, she basically has significant facet      arthropathy. May benefit from medial branch block. Discussed with      patient rationale. Will schedule.  May also consider optimizing her      home exercise program.   I will see her back for medial branch block. Will also check how she is  doing with the Lyrica  at that time. Will check urine drug screen and  make sure there are no disclosed opiates indicating multiple prescribers  given her prior history. Discussed this with patient. She does have a  new prescription for Demerol, so she should be good for at least a month  or so with what she has.   Thanks for this interesting consultation. Keep you apprised of her  progress.      Erick Colace, M.D.  Electronically Signed     AEK/MedQ  D:  03/21/2008 17:03:45  T:  03/21/2008 18:40:34  Job #:  [id number]   cc:   Dyke Brackett, M.D.  Fax: [phone]   [place], M.D.  Dentistry

## 2011-03-18 NOTE — Procedures (Signed)
NAMECYBIL, Jenna [name]              ACCOUNT NO.:  [id number]   MEDICAL RECORD NO.:  [id number]           PATIENT TYPE:   LOCATION:                                 FACILITY:   PHYSICIAN:  Erick Colace, M.D.DATE OF BIRTH:  1933-02-02   DATE OF PROCEDURE:  04/13/2008  DATE OF DISCHARGE:                               OPERATIVE REPORT   PROCEDURE:  Bilateral L5 dorsal ramus injection, bilateral L4 medial  branch block, and bilateral L3 medial branch block.   INDICATIONS:  Lumbar spondylosis without myelopathy, axial back pain.   Informed consent obtained after describing the risks and benefits of the  procedure with the patient.  These include bleeding, bruising,  infection, loss of bowel and bladder function, and temporary or  permanent paralysis.  She elects to proceed and has given written  consent.  The patient placed prone on fluoroscopy table.  Betadine prep,  sterile drape.  A 25-gauge 1-1/2 inch needle was used to anesthetize the  skin and subcutaneous tissue with 1% lidocaine of 2 mL and a 22-gauge 3-  1/2 inch spinal needle was inserted, first starting in the left S1 SAP  sacroiliac junction, bone contact made, confirmed with lateral imaging.  Omnipaque 180 x 0.5 mL demonstrated no intravascular uptake, then 0.5 mL  of normal solution containing 1 mL of 4 mL dexamethasone and 2 mL of 2%  MPF lidocaine.  Then the left L5 SAP transverse process junction target  bone contact made confirmed with lateral imaging.  Omnipaque 180 x 0.5  mL demonstrated no intravascular uptake and 0.5 mL of dexamethasone-  lidocaine solution injected.  Then the left L4 SAP transverse process  junction, target bone contact made, confirmed with lateral imaging.  Omnipaque 180 x 0.5 mL demonstrated no intravascular uptake and 0.5 mL  of dexamethasone-lidocaine solution was injected.  The same procedure  was repeated at the corresponding levels on the right side using the  same needle injecting  technique.  The patient tolerated the procedure  well.  Pre and post injection vitals stable.  Post injection  instructions given.  I will see her back in 1 month to repeat.   Pre-injection pain level 4/10, post injection 0/10.      Erick Colace, M.D.  Electronically Signed     AEK/MEDQ  D:  04/13/2008 14:29:31  T:  04/14/2008 05:38:45  Job:  [id number]

## 2011-03-18 NOTE — Assessment & Plan Note (Signed)
Jenna [name] returns today.  I last saw her approximately a month ago.  She  has a history of lumbar spondylosis without myelopathy with good and  prolonged effect from lumbar medial branch blocks around 6 months  duration, continues to have relief.  Her Demerol pill count was 26.  The  last prescription October 10, 2008.  She is appropriate with counts, in  fact prior taking a bit less than 3 times per day.  She could walk 30  minutes at times.  She climbs steps.  She drives.   CURRENT MEDICAL HISTORY:  Had a URI last couple of weeks, pulled a  muscle in her back coughing, but this has improved.  She points to an  area just next to her latissimus dorsi on the right side at the mid  axillary line.   REVIEW OF SYSTEMS:  Otherwise, negative.  Her Oswestry disability index  score today, 32%.   EXAMINATION:  GENERAL:  In no acute distress.  Mood and affect  appropriate.  Orientation is x3.  Gait is normal.  NEUROLOGIC:  She has no edema in the lower extremities.  She has normal  deep tendon reflexes bilateral upper and lower extremities.  Normal  strength in bilateral upper and lower extremities.  Her lumbar range of  motion, mild tenderness at the lumbosacral junction, but range of motion  is full.  Neck range of motion is full.   IMPRESSION:  1. Lumbar spondylosis without myelopathy, prolonged effect medial      branch block, no need for any injection at this time.  2. Neck pain.  Likely cervical spondylosis, but certainly no radicular      component.  No need for further workup at this time.   PLAN:  We will continue Demerol 50 mg 1 p.o. t.i.d., #90 for 1 month  supply.  Nursing visit times in 1 month and I will see her back in 2  months should she refers some back pain that has increased, we will  repeat medial branch blocks.      Erick Colace, M.D.  Electronically Signed     AEK/MedQ  D:  11/07/2008 16:21:29  T:  11/08/2008 06:15:53  Job #:  [id number]

## 2011-03-18 NOTE — Assessment & Plan Note (Signed)
A 77 year old female who I last saw on May 11, 2008.  She underwent  bilateral L5 dorsal ramus injection, bilateral L4 medial branch block,  bilateral L3 medial branch block, and has had some injection-related  soreness after that, however.  She has really been doing well over the  last 2 weeks.  She, in fact, has graphed her pain for me and while she  was started out in the 5-6 range, she is really down to a 2-3 range on  average.  She does continue to take the Demerol 50 mg 3 times a day;  however, has not had to take any Flexeril.  She remains active.  She is  planning to go the beach with her husband and play some golf.  She can  walk 30 minutes at a time and in fact has resumed doing her treadmill.  Her sleep is good.  Her pain is worse with walking and standing, but not  consistently so.  Relief from meds is fair-to-good.  She has occasional  numbness and tingling in her legs.  The remainder of review of systems  is negative.   Pill count reveals 12 tablets left of her Demerol 50-mg prescription  last done on May 11, 2008.  She is, therefore, taking somewhat less than  3 times a day.  Her Phenergan, she has 53 left out of her 60.  She  states she takes these on a more occasional basis.   She recently had a left shoulder injection per Dr. Madelon Lips.   PHYSICAL EXAMINATION:  VITAL SIGNS:  Blood pressure 135/80, pulse 83,  respirations 18, and O2 sat 99% on room air.  GENERAL:  In no acute distress.  Oriented x3.  Affect is good.  Gait is  normal.  MUSCULOSKELETAL:  Strength in the lower extremities is 5/5 in the hip  flexors, knee extensors, and ankle dorsiflexors.  She has good internal  and external rotation in bilateral hips.  No pain with range of motion  of the knees or ankles.  Deep tendon reflexes are normal.  Gait is  normal.  Lumbar spine range of motion is normal; however, she has pain  with extension in the spine.   IMPRESSION:  Lumbar spondylosis without myelopathy.   She has a facet  syndrome and has had relief with medial branch blocks.  A somewhat  paradoxical response last time, where she actually felt worse before she  got better.  The time before, she had 100% relief, which was rather  immediate and lasted for a solid week.  Given that she has had improving  functional status, I would not proceed on to the radiofrequency; in  fact, I may see how long the medial branch blocks last for her.  I will  see her back in another month.  I will refill her Demerol 50 mg t.i.d.  She has had no signs of aberrant drug behavior or request for early  refills and, in fact, is taking somewhat less than prescribed.      Erick Colace, M.D.  Electronically Signed     AEK/MedQ  D:  06/15/2008 13:41:25  T:  06/16/2008 07:34:15  Job #:  [id number]   cc:   Dyke Brackett, M.D.  Fax: [phone]

## 2011-03-19 NOTE — Assessment & Plan Note (Signed)
CHIEF COMPLAINT:  Back and shoulder pain.  Jenna [name] returns today, I last saw her on February 13, 2011.  She had an intraarticular injection of left shoulder under ultrasound guidance. She has very good results with this, pain free per 10 days and now has less than her usual pain 1 month post.  She has had previous non-guided injections which were not helpful for her.  She has had pain that was only partially relieved with narcotic analgesics.  In terms of her back she is doing quite well.  Her last back injection was a transforaminal lumbar epidural steroid injection on July 12, 2010.  Average pain is 3  currently this is with medications.  Her medications consist of Demerol 50 t.i.d. in combination with Phenergan 12.5 t.i.d.  She can walk 30 minutes at a time.  She climbs steps.  She drives.  She is independent with all of her self care.  PHYSICAL EXAMINATION:  VITAL SIGNS:  Her blood pressure 141/86, pulse 68, respirations 18, and O2 sat 97% on room air. GENERAL:  No acute distress.  Mood and affect appropriate.  Her gait is normal.  Her back has no tenderness to palpation.  Her left shoulder has positive impingement testing at 90 degrees.  She has difficulty reaching across, abducting the shoulder as well.  She has a history of AC joint clavicular resection.  IMPRESSION: 1. Chronic postoperative pain.  She has had multiple shoulder     surgeries.  She has had some good relief with her left ultrasound-     guided shoulder injection where she has not had previous relief     with prior non-guided injections. 2. Spinal stenosis, moderately severe, progressive but at this point     without radicular symptoms.  She had a flare-up of radicular pain     last summer, but this improved with conservative care.  I will see her back in two months and nursing visit 1 in month.  I have given her Flexeril prescription that she takes very occasional basis 45 tablets lasting her about a  year.     Erick Colace, M.D. Electronically Signed    AEK/MedQ D:  03/18/2011 13:42:30  T:  03/19/2011 00:44:05  Job #:  [id number]  cc:   Dyke Brackett, M.D. Fax: [phone]

## 2011-03-21 NOTE — Discharge Summary (Signed)
Jenna [name]. [facility]  Patient:    Jenna [name], Jenna [name]                     MRN: [id number] Adm. Date:  [id number] Disc. Date: [id number] Attending:  Teena Dunk Dictator:   Arnoldo Morale, P.A.                           Discharge Summary  ADMISSION DIAGNOSES: 1. Osteoarthritis left hip. 2. Painful right knee. 3. Hypothyroidism. 4. Gastroesophageal reflux disease. 5. Hiatal hernia. 6. Hypercholesterolemia. 7. History of peptic ulcer disease. 8. Meralgia paresthetica.  DISCHARGE DIAGNOSES:  1. Post-hemorrhagic anemia.  2. Tape blisters.  3. Seroma left hip.  4. Osteoarthritis left hip.  5. Painful right knee.  6. Hypothyroidism.  7. Gastroesophageal reflux disease.  8. Hiatal hernia.  9. Hypercholesterolemia. 10. History of peptic ulcer disease. 11. Meralgia paresthetica.  SURGICAL PROCEDURE:  On October 14, 2000, [name] underwent a left total hip arthroplasty by Dr. Marcie Mowers.  COMPLICATIONS:  None.  CONSULTATIONS: 1. Pharmacy consult for Coumadin therapy October 14, 2000. 2. Physical therapy and rehab medicine consult October 15, 2000. 3. Occupational therapy consult October 16, 2000.  HISTORY OF PRESENT ILLNESS: This 77 year old white female presented to Dr. Madelon Lips with a history of intermittent left hip pain since 1991.  She has a history of meralgia paresthetica in the left leg treated in the past with Cortisone injections and had done well until the last year when she had increasing left groin pain.  Pain now is fairly constant, and very sharp in the left groin and buttock with radiation into the thigh and lower leg.  It increases with walking and decreases with Mepergan Fortis.  It awakens her at night and she does have to use a cane p.r.n.  These are a failure with conservative measures to control a pain.  She is presenting for a left total hip arthroplasty.  HOSPITAL COURSE: Jenna [name] tolerated her surgical  procedure well without immediate postoperative complications. She was subsequently transferred to 4700.  On postop day one, T max was 100.7, vital signs stable.  Pain was well controlled with current meds.  Her dressing to the left hip was intact without drainage and her leg was neurovascularly intact.  Her hemoglobin was 7.9 with hematocrit of 22.6 and she was subsequently transfused with two units of packed red blood cells.  She was started on therapy for protocol.  On postop day two, she was feeling better.  T max was 102.4, vital signs stable.  Left hip incision well approximated with staples without drainage and leg neurovascularly intact.  There were two tape blisters noted on her buttocks just proximal to her incision.  Hemoglobin had increased to 10 with hematocrit of 28.1.  She was switched to p.o. pain meds and temperature was monitored.  Postop day three, T max was 99.2, vitals stable.  Hemoglobin 9.4, hematocrit 27.2. She still had a moderate amount of serous drainage from the left hip but otherwise no change.  Postop day four, T max was 98.4, hemoglobin 8.9, hematocrit 25.3.  Serous drainage continued from the left hip and she was doing well with therapy.  On December 17, T max was 99.4, pain well controlled with p.o. Methergan Fortis.  Left hip incision remained intact with staples but moderate serous drainage.  Leg is neurovascularly intact.  She was believed ready for discharge home and was  discharged home that day.  DISCHARGE INSTRUCTIONS: 1. She is to resume all prehospitalization medications and diet except for the    enteric coated aspirin, Celebrex and ibuprofen. 2. New meds include Mepergan Fortis 1-2 tablets p.o. q.4.h. p.r.n. for pain    #50 with no refill, Keflex 500 mg 1 tablet p.o. q.i.d. #28 with no refill,    and Coumadin 1 tablet p.o. q d with the dose determined per pharmacy. 3. She is to be out of bed, partial weight bearing 50% on the left and left     leg with the use of the walker.  Dressing to the left is supposed to be    changed twice a day and p.r.n. and the area cleaned with Betadine and dry    dressing applied. 4. She is to have home health physical therapy and RN arranged per [facility]. 5. She is to follow-up with Dr. Madelon Lips in the office on December 24 and is to    call [phone] to set up that appointment. 6. She is to notify Dr. Madelon Lips if temperature greater than 101.5, chills,    pain unrelieved by pain meds, or foul smelling drainage from the wound. 7. Additional medications include:  Ferrous sulfate 325 mg p.o. b.i.d., Colace    100 mg p.o. b.i.d., and Senokot S 2 tablets p.o. q.h.s. p.r.n. consultation.  She stated that she could understand these instructions and was subsequently discharged home.  LABORATORY AND ACCESSORY DATA:  X-ray taken of her left hip October 14, 2000 showed a satisfactory total hip replacement.  Chest x-ray done at that time showed mild hyperinflation but no active disease.  On December 7, hemoglobin 10.2, hematocrit 29.9.  On December 13, hemoglobin 7.9, hematocrit 22.6.  On December 14, hemoglobin 10, hematocrit 28.1.  On December 15, hemoglobin 9.4, hematocrit 27.2.  On December 16, white count 7.1, hemoglobin 8.9, hematocrit 25.3, and platelets 290,000.  On December 7, her PT was 12.47 with an INR of 0.9 and PTT of 33.  On December 17, her PT had increased to 20.4 with an INR of 2.3.  All other laboratory studies are within normal limits. DD:  11/04/00 TD:  11/04/00 Job: [id number]

## 2011-03-21 NOTE — Discharge Summary (Signed)
Jenna [name], Jenna [name]              ACCOUNT NO.:  [id number]   MEDICAL RECORD NO.:  [id number]          PATIENT TYPE:  INP   LOCATION:  5033                         FACILITY:  [facility]   PHYSICIAN:  Dyke Brackett, M.D.    DATE OF BIRTH:  1933-01-05   DATE OF ADMISSION:  06/27/2005  DATE OF DISCHARGE:  07/02/2005                                 DISCHARGE SUMMARY   ADMISSION DIAGNOSES:  1.  Right hip pain with arthritic changes on MRI.  2.  History of thyroid disease.  3.  History of hiatal hernia.  4.  History of peptic ulcer disease.  5.  History of sarcoidosis.  6.  History of irritable bowel syndrome.  7.  History of lower back pain.   DISCHARGE DIAGNOSES:  1.  Status post right total hip arthroplasty.  2.  Acute blood loss anemia secondary to the surgery with blood transfusion.  3.  Hypokalemia, potassium replaced.  4.  Right hip hematoma.  5.  History of thyroid disease.  6.  History of hiatal hernia.  7.  History of peptic ulcer disease.  8.  History of sarcoidosis.  9.  History of irritable bowel syndrome.  10. History of chronic lower back pain.   HISTORY OF PRESENT ILLNESS:  The patient is a 77 year old white female with  a history of sharp right groin pain for the past three years. The patient  states that the pain is a sharp, stabbing pain in the right groin that does  not radiate up or down the right lower extremity. She has pain in the right  hip with ambulation. She denies any mechanical symptoms. MRI of the right  hip shows loss of joint space and cartilage irregularities with subchondral  cyst of the anterior acetabular roof measuring 1.3 cm in diameter.   ALLERGIES:  CODEINE, OXYCODONE, DARVON, VICODIN, TYLENOL, SUDAFED, MOTRIN,  DALMANE, NSAIDs, PRAVACHOL, ZOCOR, KEFLEX, BIAXIN.   MEDICATIONS:  1.  Levothyroxine 125 mcg p.o. daily.  2.  Nexium 40 mg p.o. q.h.s.  3.  Lipitor 20 mg p.o. q.p.m.  4.  Estradiol patch, two patches each week.  5.  Multivitamin  one tab daily.  6.  Calcium 600 mg p.o. b.i.d.  7.  Aspirin 8 1mg  p.o. daily.  8.  Librax 2.5 mg p.o. b.i.d. p.r.n.  9.  Mepergan Fortis one tablet p.o. b.i.d.   SURGICAL PROCEDURE:  The patient was taken to the operating room on June 27, 2005, by Dr. Madelon Lips, assisted by Legrand Pitts. Duffy, P.A.  The patient was  placed under general anesthesia and underwent a right total hip  arthroplasty. The following components were used: Small stature size 13.5  femoral component; an apex hole eliminator; acetabular liner lipped, 25 mm,  outside diameter 50 mm; size 55-mm acetabular cuff, and a 20-mm plus 5  femoral head. The patient tolerated the procedure well and returned to the  recovery room in good and stable condition.   CONSULTATIONS:  The following consultations were obtained while the patient  was hospitalized: PT, OT, and case management.   HOSPITAL COURSE:  On postoperative  day one, the patient's vital signs were  stable. H&H 9.4/26.9. The patient was switched from Arixtra from Lovenox  which was covered by insurance.   On postoperative day two, the patient was afebrile, vital signs were stable.  Hemoglobin was 9.4, hematocrit 26.9. The patient was asymptomatic.   On postoperative day three, the patient's T-max was 101.3, H&H 8.4 and 23.8.  Patient with increased heart rate and respiratory rate, no dizziness. The  patient deferred transfusion. H&H to be monitored. The right hip wound was  benign with minimal bleeding at that time.   On postoperative day four, the patient was feeling weak, dizzy when first  getting up out of bed. H&H 7.5 and 21.5.  Therefore, the patient was  transfused two units of packed red blood cells. Potassium was 3.1 and  therefore potassium was replaced. MRI of the hip revealed hematoma,  otherwise wound benign __________ to the right hip.   On postoperative day five, the patient was overall felt much improved,  status post two units of packed red blood  cells, T-max 100.3, vital signs  stable, H&H 9.9 and 29.4, potassium increased to 3.5.  The right hip had  mild edema, ecchymosis, minimal serosanguineous drainage. The patient was  progressing well with physical therapy. It was felt that the patient's  Lovenox could be held and resumed on no July 03, 2005, at 10 a.m. The  patient was discharged later that afternoon and she remained stable.   LABORATORY DATA:  Routine labs on admission showed CBC with all values  within normal limits. Coags on admission revealed all values within normal  limits. Routine chemistries revealed all values within normal limits on  admission. Hepatic enzymes revealed all values within normal limits on  admission. UA negative on admission.   Two-view of hip dated June 27, 2005, postoperatively showed well seated  components of right total hip prosthesis. No complicating features. EKG  dated June 23, 2005, showed a  normal sinus rhythm with heart rate of 81  beats per minute, PR interval of 130 milliseconds, PRT axis at 63.6.   MEDICATIONS:  1.  Mepergan Fortis 50/725 mg one to two q.4-6h.  2.  Lovenox 40 mg one injection at 10 a.m. times nine days.  3.  Robaxin 500 mg one to two tablets q.6-8h. for spasm.  4.  Iron 325 mg one tab per day times 28 days.  5.  Resume home meds, except for aspirin while on Lovenox; once finished      with Lovenox resume aspirin.   DIET:  No restrictions.   ACTIVITY:  The patient is partial weightbearing, 50%, right hip and walker.   WOUND CARE:  The patient may shower after two days if no drainage from wound  site, to keep the wound clean and dry, change dressing daily. Call if  temperature greater than 101, foul smelling drainage, or pain not controlled  with pain medications.   FOLLOWUP:  The patient is to follow up with Dr. Madelon Lips in approximately nine days after discharge. The patient is to call [phone] for an  appointment. The patient is to follow up with Dr.  Su Hilt, primary care  physician, in 10-14 days postoperative for anemia and hypokalemia.   SPECIAL INSTRUCTIONS:  1.  Knee immobilizer on __________ bed.  2.  Home health PT, [facility].      Richardean Canal, Arnetha Courser, M.D.  Electronically Signed    GC/MEDQ  D:  09/15/2005  T:  09/15/2005  Job:  [id number]   cc:   Antony Madura, M.D.  Fax: [phone]

## 2011-03-21 NOTE — Op Note (Signed)
[place]  Patient:    Jenna [name], Jenna [name]                     MRN: [id number] Adm. Date:  [id number] Attending:  Cameron Proud CC:         Antony Madura, M.D.   Operative Report  PREOPERATIVE DIAGNOSIS:  Mediastinal and hilar adenopathy.  POSTOPERATIVE DIAGNOSIS:  Mediastinal and hilar adenopathy.  OPERATION PERFORMED:  Fiberoptic bronchoscopy and mediastinoscopy.  SURGEON:  D. Karle Plumber, M.D.  ANESTHESIA:  General anesthesia.  INDICATION:  This patient developed right lower lobe pneumonia and CT scan showed marked mediastinal and hilar adenopathy.  A right infrahilar mass was favored.  DESCRIPTION OF PROCEDURE:  After general anesthesia, the fiberoptic bronchoscope was passed through the endotracheal tube.  The carina was in the midline.  The left upper lobe and left lower lobe orifices were normal.  The right main stem/right upper lobe were normal, although there was some pus in the right upper lobe.  Bronchus intermedius also was normal, with the right middle lobe and right lower lobe orifices normal.  No endobronchial tumor was seen.  Washings and cultures were taken from this area.  The fiberoptic bronchoscope was removed.  The anterior neck was prepped and draped in the usual sterile manner.  A transverse incision was made above the sternal notch and carried down with electrocautery through subcutaneous tissue and fascia. The pretracheal fascia was entered and biopsies of 4-R, 7 and 4-L nodes were done.  Frozen section revealed an inflammatory node.  No tumor was seen. Strap muscles were closed with 2-0 Vicryl and the subcutaneous tissue with 3-0 Vicryl.  Steri-Strips were applied.  Patient was returned to recovery room in stable condition. DD:  05/07/01 TD:  05/07/01 Job: [id number]

## 2011-03-21 NOTE — Op Note (Signed)
Jenna [name], Jenna [name]              ACCOUNT NO.:  [id number]   MEDICAL RECORD NO.:  [id number]          PATIENT TYPE:  INP   LOCATION:  2899                         FACILITY:  [facility]   PHYSICIAN:  Dyke Brackett, M.D.    DATE OF BIRTH:  [birth year]   DATE OF PROCEDURE:  06/27/2005  DATE OF DISCHARGE:                                 OPERATIVE REPORT   PREOPERATIVE DIAGNOSIS:  Osteoarthritis, right hip.   POSTOPERATIVE DIAGNOSIS:  Osteoarthritis, right hip.   OPERATION PERFORMED:  Right total hip replacement, AML, small stature, 13.5  mm with + 5 mm neck length, 28 mm hip ball, 50 mm Pinnacle acetabular cup.   SURGEON:  Dyke Brackett, M.D.   ASSISTANT:  Legrand Pitts. Duffy, P. A.   ESTIMATED BLOOD LOSS:  Blood loss was approximately 150 mL.   DESCRIPTION OF OPERATION:  The patient was sterilely prepped and draped in  the lateral position.  Posterior approach to the hip was made.  There was  careful splitting of the iliotibial band and external rotators with careful  protection of the sciatic nerve.  The acetabulum was inspected and it was  moderately worn as was then head with significant osteoarthritis noted.  A  __________  cut was made, which was later revised to about one fingerbreadth  above the lesser trochanter.  This was followed by reaming the acetabulum up  to 49 mm to accept a 50 mm cup.  The cup liner was placed.  Attention was  next directed back to the femur, which was progressively reamed and rasped  up to a 13 mm size for a 13.5 mm stem.  A small stature was more appropriate  on this hip and it was one size above on the diameter compared to the  opposite hip, which was done several years prior.   The bony chips were irrigated.  The femoral stem was next placed.  Prior to  this the apex __________  screw was placed through the metal cup followed by  the permanent cup with the offset at about 10 o'clock.  Again a trial  reduction was carried out with the final stem with a  trial neck length at  1.5 and  plus 5, and the plus 5 had excellent stability, good restoration of  leg length and no tendency for dislocation.   The wound was again irrigated.  The iliotibial band was closed with #1 Ti-  Cron.  The capsule was approximated with #1 Ti-Cron, the subcutaneous tissue  with 0 and 2-0 Vicryl, and skin with staples,   The patient was taken to the recovery room in stable condition.      Dyke Brackett, M.D.  Electronically Signed    WDC/MEDQ  D:  06/27/2005  T:  06/28/2005  Job:  [id number]

## 2011-03-21 NOTE — Op Note (Signed)
[place]  Patient:    Jenna [name], Jenna [name]                     MRN: [id number] Proc. Date: 10/14/00 Adm. Date:  [id number] Attending:  Teena Dunk                           Operative Report  PREOPERATIVE DIAGNOSIS:  Osteoarthritis, left hip.  POSTOPERATIVE DIAGNOSIS:  Osteoarthritis, left hip.  OPERATION PERFORMED:  Left total hip replacement (Depuy AML 12 mm standard femur with 8.5 mm neck length, 28 mm hip ball with 50 mm 10 degree DuraLock [place] acetabular liner.  SURGEON:  Sharlot Gowda., M.D.  ANESTHESIA:  General.  ASSISTANT:  Dick R. Tresa Res, M.D.  ESTIMATED BLOOD LOSS:  Approximately 150 cc.  DESCRIPTION OF PROCEDURE:  Sterile prep and drape, lateral position. Posterior approach to the hip made with careful identification of the sciatic nerve.  The knee was kept flexed during the whole procedure to minimize any traction on the nerve. The iliotibial band was split as were the short external rotators of the hip.  Capsulotomy was made.  Capsulotomy later repairred with #1 Ethibond.  Hip showed rather severe wear uniformly.  It was delivered out of the acetabulum, reamed up to accept a 12 mm prosthesis followed by rasping to the same size standard.  Attention was next directed to the acetabulum which was reamed up to a 49 to accept a 50 mm acetabulum 1 mm underreaming and 0.5 mm underreaming on the femur.  A portion of the anterior lip of the acetabulum was removed due to impingement from this on the prosthesis.  The acetabulum was reamed to approximately 45 to 50 degrees of abduction with 10 to 15 degrees of anteversion.  Trial cup was placed.  A 48 bottomed out easily, a 50 did not.  A final cup was placed for a trial liner followed by trial reduction using the rasp deemed to be most appropriate with stability and maintenance of the leg lengths at 8.5 mm neck length.  Final components were next inserted with an  Apex hole eliminator on the acetabulum following with a 10 degree lip as well as a +8.5 mm neck length after irrigation of all bony surfaces.  Stability was deemed to be excellent. Closure was effected with T capsulotomy closed with #1 Ethibond on the fascia, #1 Panacryl on the deep subcutaneous tissues, 2-0 Vicryl in the subcutaneous tissues, skin with a stapling device, Marcaine with epinephrine in the skin. Hemovac drain placed deep to the fascia.  A lightly compressive sterile dressing applied.  The patient was taken to the recovery room in stable condition. DD:  10/14/00 TD:  10/14/00 Job: [id number]

## 2011-03-21 NOTE — H&P (Signed)
Jenna [name], Jenna [name]              ACCOUNT NO.:  [id number]   MEDICAL RECORD NO.:  [id number]          PATIENT TYPE:  INP   LOCATION:                               FACILITY:  [facility]   PHYSICIAN:  Dyke Brackett, M.D.    DATE OF BIRTH:  Jun 17, 1933   DATE OF ADMISSION:  06/27/2005  DATE OF DISCHARGE:                                HISTORY & PHYSICAL   HISTORY OF PRESENT ILLNESS:  The patient is a 77 year old white female with  a history of sharp right groin pain for the last 3 years.  The patient  states that she has a sharp stabbing pain in the right groin that does not  radiate anywhere.  It is present with ambulation.  She denies any mechanical  symptoms.  MRI findings are consistent with loss of joint space and  __________ cartilage irregularities with subchondral cysts of the anterior  acetabular roof measuring 1.3 cm in diameter.   ALLERGIES:  1.  CODEINE.  2.  OXYCODONE.  3.  DARVON.  4.  VICODIN.  5.  TYLENOL.  6.  SUDAFED.  7.  ULTRAM.  8.  DALMANE.  9.  NSAID'S.  10. PRAVACHOL.  11. ZOCOR.  12. KEFLEX.  13. BIAXIN.   CURRENT MEDICATIONS:  1.  Levothyroxine 125 mcg p.o. daily.  2.  Nexium 40 mg p.o. q.h.s.  3.  Lipitor 20 mg p.o. q.p.m.  4.  Estradiol patch, two patches q week.  5.  Multivitamins one tablet a day.  6.  Calcium 600 mg p.o. b.i.d.  7.  Aspirin 81 mg p.o. daily.  8.  Librax 2.5 mg p.o. b.i.d. p.r.n.  9.  Mepergan fortis one tablet p.o. b.i.d.   MEDICAL HISTORY:  1.  Thyroid disease.  2.  Hiatal hernia.  3.  Peptic ulcer disease.  4.  History of pulmonary sarcoidosis.  5.  History of irritable bowel.  6.  History of chronic back pain.   SURGICAL PROCEDURES:  The patient has had numerous surgical procedures  including -  1.  Tonsillectomy.  2.  Rotator cuff surgeries multiple times, right and left shoulders.  3.  Cholecystectomy.  4.  Appendectomy.  5.  Biopsy of breast mass.  6.  Lipoma removal.  7.  Hip replacements.   The patient  denies any complications of the above-mentioned surgical  procedures.   SOCIAL HISTORY:  The patient is a 77 year old white female who denies any  history of smoking.  She does have one glass of wine a day.  She is married.  She has one grown son.  She lives in a [place] house.  She is a retired  Astronomer. Secondary school teacher.   FAMILY PHYSICIAN:  Antony Madura, M.D. at [phone].   FAMILY MEDICAL HISTORY:  Mother is alive at 35 years of age with a history  of cardiac disease and emphysema.  Father is deceased at the age of 51 from  a myocardial infarction.  One brother is alive with diabetes.   REVIEW OF SYSTEMS:  Positive for glasses and contact lenses.  She does have  some shortness  of breath with exertion, which she relates to her physical  conditioning and the history of pulmonary sarcoidosis.  She does have  reflux, which is fairly well controlled presently.   PHYSICAL EXAMINATION:  VITAL SIGNS:  Height is 5 feet, 3 inches.  Weight is  150 pounds.  Blood pressure is 136/88, pulse 80 and regular, respirations  12.  The patient is afebrile.  GENERAL:  This is a healthy-appearing 77 year old white female who ambulates  fairly easily.  Gets easily on and off the exam table.  HEENT:  Head was normocephalic.  Pupils were slightly constricted.  Extraocular movements intact.  Sclerae are anicteric.  External ears were  without deformities.  Gross hearing was intact.  NECK:  Supple.  No palpable lymphadenopathy.  The thyroid region was  nontender.  The patient had good range of motion of her cervical spine  without any difficulty or tenderness.  CHEST:  Lung sounds were clear and equal and bilaterally.  No wheezes,  rales, or rhonchi.  HEART:  Regular rate and rhythm.  No murmurs, rubs, or gallops.  ABDOMEN:  Soft, nontender.  Bowel sounds present.  The CVA region was  nontender.  EXTREMITIES:  The upper extremities were symmetrically sized and shaped.  She had good range of motion of her  right shoulder, elbow, and wrist.  Motor  strength was 5/5.  Left shoulder - she was rather slow with extension and  abduction of the shoulder region.  It was slightly sore but she had good  range of motion of her elbows and wrists.  Lower extremities - right and  left hip had full extension, flexion up to 120 degrees with 20 degrees of  internal and external rotation without any mechanical symptoms or complaints  of soreness.  Bilateral knees were symmetrical.  No signs of erythema or  ecchymosis.  No effusions.  She had full extension and flexion.  Calves were  nontender.  The ankles were symmetrical with good dorsi and plantar flexion.  Peripheral vascular - carotid pulses were 2+, no bruits.  Radial pulses were  2+.  Dorsalis pedis pulses were 2+.  She had no edema or venous stasis  changes in the lower extremities.  NEUROLOGIC:  The patient was conscious, alert, and appropriate.  __________.  She had no gross neurologic defects noted.  BREAST/RECTAL/GU EXAMS:  Deferred at this time.   IMPRESSION:  1.  Arthritic changes on MRI with a history of right hip pain.  2.  History of thyroid disease.  3.  History of hiatal hernia.  4.  History of peptic ulcer disease.  5.  History of sarcoidosis.  6.  History of irritable bowel syndrome.  7.  History of chronic lower back pain.   PLAN:  The patient will undergo all other routine labs and tests prior to  having a right total hip arthroplasty by Dr. Madelon Lips at [facility]  on June 27, 2005.      Jamelle Rushing, Arnetha Courser, M.D.  Electronically Signed    RWK/MEDQ  D:  06/16/2005  T:  06/16/2005  Job:  [id number]

## 2011-03-21 NOTE — Assessment & Plan Note (Signed)
HISTORY OF PRESENT ILLNESS:  A 77 year old female with lumbar  spondylosis without myelopathy.  She has had good results again after  medial branch blocks performed on May 17, 2009.  She is approximately 2  months post without any recurrence of her thigh or back pain.  Her pain  is down to about a 4/10, interferes with activity at a 2/10 level.  Sleep is good.  Pain improves with rest and medications.  Relief from  meds is good.   CURRENT PAIN MEDICATIONS:  1. Demerol 50 t.i.d.  2. She takes Phenergan 25 b.i.d. with this.   FUNCTIONAL STATUS:  Independent.  She is able to climb steps.  She is  able to drive.   PHYSICAL EXAMINATION:  Her blood pressure is 142/71, pulse 76,  respirations 18, O2 sat 98% on room air.  A well-developed, well-  nourished female in no acute distress.  Orientation x3.  Affect is  bright, alert.  Gait is normal.  Extremities without edema.  Coordination normal.  Sensation is normal.  Gait is normal.   IMPRESSION:  Lumbosacral spondylosis without myelopathy.  She still has  some pain with hyperextension, but overall is doing well after medial  branch blocks.   PLAN:  1. We will continue current pain medications.  2. I will see her back in about 3 months.  Nursing visit in 1 month.      Should her back pain recur, would repeat medial branch blocks.      Erick Colace, M.D.  Electronically Signed     AEK/MedQ  D:  07/10/2009 14:22:10  T:  07/11/2009 05:07:59  Job #:  [id number]   cc:   Dyke Brackett, M.D.  Fax: [phone]

## 2011-04-16 ENCOUNTER — Encounter: Payer: 59 | Attending: Physical Medicine & Rehabilitation | Admitting: Neurosurgery

## 2011-04-16 DIAGNOSIS — M67919 Unspecified disorder of synovium and tendon, unspecified shoulder: Secondary | ICD-10-CM | POA: Insufficient documentation

## 2011-04-16 DIAGNOSIS — M545 Low back pain: Secondary | ICD-10-CM

## 2011-04-16 DIAGNOSIS — M25529 Pain in unspecified elbow: Secondary | ICD-10-CM

## 2011-04-16 DIAGNOSIS — M719 Bursopathy, unspecified: Secondary | ICD-10-CM | POA: Insufficient documentation

## 2011-04-16 DIAGNOSIS — IMO0002 Reserved for concepts with insufficient information to code with codable children: Secondary | ICD-10-CM | POA: Insufficient documentation

## 2011-04-16 DIAGNOSIS — M47817 Spondylosis without myelopathy or radiculopathy, lumbosacral region: Secondary | ICD-10-CM | POA: Insufficient documentation

## 2011-04-16 DIAGNOSIS — M25519 Pain in unspecified shoulder: Secondary | ICD-10-CM | POA: Insufficient documentation

## 2011-04-17 NOTE — Assessment & Plan Note (Signed)
Jenna [name] follows up today for back and shoulder pain.  She has been seeing Dr. Wynn Banker for this for sometime.  She has had intra-articular injections in left shoulder ultrasound-guided and is planning on repeating that in another month.  She states right now her back pain is not too bad.  She rates her pain at 2-3, but her shoulders tends to hurt a little more.  Activity levels anywhere from 0-4.  Her sleep patterns are good.  Pain is worse during the daytime in the evening.  She walks without assistance.  She walks 3- 4 minutes at time.  She climbs steps and drives.  REVIEW OF SYSTEMS:  Notable for difficulties described above, otherwise within normal limits.  PAST MEDICAL HISTORY:  Unchanged.  SOCIAL HISTORY:  Unchanged.  FAMILY HISTORY:  Unchanged.  PHYSICAL EXAMINATION:  VITAL SIGNS:  Her blood pressure is 145/59, pulse 73, respirations 18, and O2 saturation is 98% on room air. NEUROLOGIC:  Motor strength is good in the upper and lower extremities. She is little weak in the left shoulder due to multiple surgeries.  Her sensation appears to be intact.  Constitutionally, she is within normal limits.  She is alert and oriented x3.  Her affect is bright.  Her gait is normal.  Her Oswestry score today is 34.  IMPRESSION: 1. Chronic postoperative pain in the shoulder. 2. Spinal stenosis.  PLAN: 1. We will go ahead and refill her Demerol 50 mg one p.o. t.i.d. 90     with no refill.  There is some question whether Dr. Wynn Banker want     to reduce that to b.i.d. but the patient states that there was a     misunderstanding and I had been straightened out.  I will leave     that to Dr. Wynn Banker in 1 month. 2. She will return in 1 month to see Dr. Wynn Banker for an injection of     the shoulder under ultrasound guidance.  Her questions were     encouraged and answered.     Sharion Grieves L. Blima Dessert Electronically Signed    RLW/MedQ D:  04/16/2011 14:30:08  T:  04/17/2011  05:56:29  Job #:  [id number]

## 2011-05-05 ENCOUNTER — Encounter (HOSPITAL_BASED_OUTPATIENT_CLINIC_OR_DEPARTMENT_OTHER): Payer: Self-pay | Admitting: Physical Medicine & Rehabilitation

## 2011-05-05 ENCOUNTER — Encounter: Payer: Medicare Other | Attending: Physical Medicine & Rehabilitation

## 2011-05-05 DIAGNOSIS — M67919 Unspecified disorder of synovium and tendon, unspecified shoulder: Secondary | ICD-10-CM | POA: Insufficient documentation

## 2011-05-05 DIAGNOSIS — M19029 Primary osteoarthritis, unspecified elbow: Secondary | ICD-10-CM

## 2011-05-05 DIAGNOSIS — M25519 Pain in unspecified shoulder: Secondary | ICD-10-CM | POA: Insufficient documentation

## 2011-05-05 DIAGNOSIS — M47817 Spondylosis without myelopathy or radiculopathy, lumbosacral region: Secondary | ICD-10-CM | POA: Insufficient documentation

## 2011-05-05 DIAGNOSIS — M719 Bursopathy, unspecified: Secondary | ICD-10-CM | POA: Insufficient documentation

## 2011-05-05 DIAGNOSIS — IMO0002 Reserved for concepts with insufficient information to code with codable children: Secondary | ICD-10-CM | POA: Insufficient documentation

## 2011-05-05 NOTE — Procedures (Signed)
Jenna [name], Jenna [name]              ACCOUNT NO.:  [id number]  MEDICAL RECORD NO.:  [id number]           PATIENT TYPE:  O  LOCATION:  TPC                          FACILITY:  [facility]  PHYSICIAN:  Erick Colace, M.D.DATE OF BIRTH:  1933-04-26  DATE OF PROCEDURE:  05/05/2011 DATE OF DISCHARGE:                              OPERATIVE REPORT  PROCEDURE:  Ultrasound-guided left intra-articular shoulder injection.  INDICATION:  Left shoulder OA, history of multiple rotator cuff procedures, last injection done on February 13, 2011.  Her pain persist despite medication management including narcotic analgesics.  She has had good success with guided intra-articular injection in the left shoulder, but no success with a blinded injections.  Informed consent was obtained after describing risks and benefits of the procedure with the patient.  These include bleeding, bruising, and infection.  She elects to proceed and has given written consent.  The patient placed in a seated position.  Posterolateral approach utilized area inferior to the spinous process of the scapula was scanned laterally.  Glenohumeral joint identified, area marked and prepped with Betadine.  1% lidocaine infiltrated into skin and subcu tissues.  Under direct visualization, 22-gauge echo block needle 50 mm inserted into the left shoulder joint.  1 mL of 40 mg/mL Depo-Medrol and 4 mL of 1% lidocaine injected into the joint after negative drawback for blood. The patient tolerated the procedure well.  Postprocedure instructions given.  I will see her back in about 4-6 weeks for followup.     Erick Colace, M.D. Electronically Signed    AEK/MEDQ  D:  05/05/2011 14:32:52  T:  05/05/2011 17:23:40  Job:  [id number]

## 2011-05-15 ENCOUNTER — Encounter: Payer: MEDICARE | Admitting: Physical Medicine & Rehabilitation

## 2011-06-16 ENCOUNTER — Ambulatory Visit: Payer: 59 | Admitting: Physical Medicine & Rehabilitation

## 2011-06-16 ENCOUNTER — Encounter: Payer: 59 | Attending: Physical Medicine & Rehabilitation

## 2011-06-16 DIAGNOSIS — M719 Bursopathy, unspecified: Secondary | ICD-10-CM | POA: Insufficient documentation

## 2011-06-16 DIAGNOSIS — M25519 Pain in unspecified shoulder: Secondary | ICD-10-CM | POA: Insufficient documentation

## 2011-06-16 DIAGNOSIS — M47817 Spondylosis without myelopathy or radiculopathy, lumbosacral region: Secondary | ICD-10-CM

## 2011-06-16 DIAGNOSIS — M75100 Unspecified rotator cuff tear or rupture of unspecified shoulder, not specified as traumatic: Secondary | ICD-10-CM

## 2011-06-16 DIAGNOSIS — IMO0002 Reserved for concepts with insufficient information to code with codable children: Secondary | ICD-10-CM | POA: Insufficient documentation

## 2011-06-16 DIAGNOSIS — M67919 Unspecified disorder of synovium and tendon, unspecified shoulder: Secondary | ICD-10-CM | POA: Insufficient documentation

## 2011-06-16 NOTE — Assessment & Plan Note (Signed)
CHIEF COMPLAINT:  Back pain, increasing.  HISTORY:  A 77 year old female who I saw on May 05, 2011, for left shoulder injection under ultrasound guidance.  She did get good results with this, although perhaps not quite as good as she did on February 13, 2011.  She has had no falls, no other injuries.  Her back hurts mainly on the right side.  It does not radiate into the right leg.  Her average pain is 4/10, but currently 6.  Pain is described as sharp, intermittent aching, worsening when standing especially on the right leg.  Pain interferes with activity with a moderate level.  Sleep is good.  Pain is worse with walking, standing; improves with rest, medications, injections.  Relief from meds is good.  She can walk 30 minutes at a time.  She can climbs steps.  She drives.  Her current pain medicines: 1. Demerol 50 mg t.i.d. 2. Promethazine 25 b.i.d.  Oswestry score 38% which compares with 36% on May 05, 2011.  PHYSICAL EXAMINATION:  MUSCULOSKELETAL:  She has decreased abduction, left shoulder compared to right side as well as decreased forward flexion, gets about 90 degrees on each of those on the left side and 135 on the right.  She has normal sensation in the upper and lower extremities.  Negative straight leg raising test.  Deep tendon reflexes are mildly reduced, bilateral ankles even with facilitation and normal at the knees.  Upper extremity reflexes are sluggish, 1+ biceps, triceps, brachioradialis bilaterally.  Back has mild tenderness to palpation, lumbosacral junction right side.  She has pain which is worsened with extension and once again on the right side primarily.  IMPRESSION: 1. Lumbar spondylosis.  She has had very good results with medial     branch blocks.  We will need to repeat right side.  She has had the     last set done greater than 1 year ago. 2. Shoulder pain, left chronic rotator cuff tear as well as     osteoarthritis, has had some good relief with  ultrasound-guided     injection, but will not repeat at this point, can repeat perhaps in     October.  Discussed with patient, agrees with plan.  We will     continue the Demerol.     Erick Colace, M.D. Electronically Signed    AEK/MedQ D:  06/16/2011 12:27:59  T:  06/16/2011 21:19:35  Job #:  [id number]

## 2011-07-08 ENCOUNTER — Encounter: Payer: 59 | Attending: Physical Medicine & Rehabilitation

## 2011-07-08 ENCOUNTER — Ambulatory Visit (HOSPITAL_BASED_OUTPATIENT_CLINIC_OR_DEPARTMENT_OTHER): Payer: 59 | Admitting: Physical Medicine & Rehabilitation

## 2011-07-08 DIAGNOSIS — M75100 Unspecified rotator cuff tear or rupture of unspecified shoulder, not specified as traumatic: Secondary | ICD-10-CM

## 2011-07-08 DIAGNOSIS — M47817 Spondylosis without myelopathy or radiculopathy, lumbosacral region: Secondary | ICD-10-CM | POA: Insufficient documentation

## 2011-07-08 DIAGNOSIS — M7520 Bicipital tendinitis, unspecified shoulder: Secondary | ICD-10-CM

## 2011-07-08 DIAGNOSIS — M67919 Unspecified disorder of synovium and tendon, unspecified shoulder: Secondary | ICD-10-CM | POA: Insufficient documentation

## 2011-07-08 DIAGNOSIS — M25519 Pain in unspecified shoulder: Secondary | ICD-10-CM | POA: Insufficient documentation

## 2011-07-08 DIAGNOSIS — IMO0002 Reserved for concepts with insufficient information to code with codable children: Secondary | ICD-10-CM | POA: Insufficient documentation

## 2011-07-08 DIAGNOSIS — M719 Bursopathy, unspecified: Secondary | ICD-10-CM | POA: Insufficient documentation

## 2011-07-08 NOTE — Assessment & Plan Note (Signed)
Ms. Pesch is complaining of increasing right shoulder pain.  She has had one surgery on this shoulder.  She has had multiple shoulder surgeries for rotator cuff repair on the left.  She has had 3/10 pain with her narcotic analgesic.  She can walk 30 minutes at time.  She can climb steps.  She drives.  Her main problem is reaching behind or getting a shirt on.  Her back is doing much better to the point where she does not feel like she needs medial branch blocks.  PHYSICAL EXAMINATION:  VITAL SIGNS:  Her blood pressure 158/78, pulse 66, respirations 18, O2 sat 98% on room air. GENERAL:  No acute distress.  Mood and affect appropriate. MUSCULOSKELETAL:  Her back has no tenderness to palpation.  Her right shoulder is positive for pain with external rotation, appears to be mainly in the bicipital groove area and no clear-cut impingement sign. She has no tenderness over the [facility] joint, no tenderness over the trapezius, no tenderness over the scapula area.  On the left side, she continues to have positive impingement sign.  She has atrophic cuff muscles.  Neck has no pain with range of motion.  IMPRESSION: 1. Left rotator cuff syndrome. 2. Right biceps tenosynovitis. 3. Left glenohumeral arthritis with crepitus.  PLAN: 1. We will inject the left shoulder under ultrasound guidance. 2. Voltaren gel to the right biceps tendon if not much better with     this, we will scan limited shoulder or no do scan on the right     shoulder as well at next visit.     Erick Colace, M.D. Electronically Signed    AEK/MedQ D:  07/08/2011 16:07:02  T:  07/08/2011 21:45:54  Job #:  [id number]

## 2011-07-28 ENCOUNTER — Ambulatory Visit: Payer: 59 | Admitting: Physical Medicine & Rehabilitation

## 2011-07-31 ENCOUNTER — Ambulatory Visit (HOSPITAL_BASED_OUTPATIENT_CLINIC_OR_DEPARTMENT_OTHER): Payer: 59 | Admitting: Physical Medicine & Rehabilitation

## 2011-07-31 DIAGNOSIS — M7520 Bicipital tendinitis, unspecified shoulder: Secondary | ICD-10-CM

## 2011-07-31 NOTE — Procedures (Signed)
NAMEDESEREA, BORDLEY              ACCOUNT NO.:  [id number]  MEDICAL RECORD NO.:  [id number]           PATIENT TYPE:  O  LOCATION:  [facility]                          FACILITY:  [facility]  PHYSICIAN:  Erick Colace, M.D.DATE OF BIRTH:  1932/11/15  DATE OF PROCEDURE: DATE OF DISCHARGE:                              OPERATIVE REPORT  REASON FOR VISIT:  Right shoulder pain.  PROCEDURE:  Right biceps tendon sheath injection long head of the biceps.  INDICATIONS:  Right bicipital tendonitis.  Informed consent was obtained after describing risks and benefits of the procedure with the patient.  These include bleeding, bruising and infection.  She elects to proceed and has given written consent.  The patient placed in a seated position area.  Pre-scant fluid around the biceps tendon sheath was noted.  Area marked and prepped with Betadine under direct ultrasound imaging 22-gauge, 40-mm echo block needle was inserted under direct visualization into the bicipital groove.  A 1 mL of 40 mg/mL Depo-Medrol and 3 mL of 1% lidocaine injected.  The patient tolerated the procedure well.  Postprocedure instructions given.  Return in 1 month possible injection of the left shoulder.  If this was not particularly helpful, would need to do a full shoulder scan.     Erick Colace, M.D. Electronically Signed    AEK/MEDQ  D:  07/31/2011 12:30:00  T:  07/31/2011 12:48:13  Job:  [id number]

## 2011-08-15 ENCOUNTER — Encounter: Payer: Medicare Other | Attending: Physical Medicine & Rehabilitation | Admitting: Neurosurgery

## 2011-08-15 DIAGNOSIS — M67919 Unspecified disorder of synovium and tendon, unspecified shoulder: Secondary | ICD-10-CM

## 2011-08-15 DIAGNOSIS — M719 Bursopathy, unspecified: Secondary | ICD-10-CM | POA: Insufficient documentation

## 2011-08-15 DIAGNOSIS — IMO0002 Reserved for concepts with insufficient information to code with codable children: Secondary | ICD-10-CM | POA: Insufficient documentation

## 2011-08-15 DIAGNOSIS — M25519 Pain in unspecified shoulder: Secondary | ICD-10-CM | POA: Insufficient documentation

## 2011-08-15 DIAGNOSIS — M25529 Pain in unspecified elbow: Secondary | ICD-10-CM

## 2011-08-15 DIAGNOSIS — M47817 Spondylosis without myelopathy or radiculopathy, lumbosacral region: Secondary | ICD-10-CM | POA: Insufficient documentation

## 2011-08-15 NOTE — Assessment & Plan Note (Signed)
This is a patient who was seen for right shoulder pain.  She is followed by Dr. Wynn Banker and he injected her shoulder a couple weeks ago.  Since that time, the patient has had a fall which she states that she did not feel like it really aggravated her current problem.  She did reach quickly for an item in her cabinet and felt a "pop in her right proximal biceps area."  She is now bruised distally and her biceps appears to be retracting head of the humerus.  I tend to believe she has torn proximal biceps tendon.  She has a limited range of motion.  She rates her average pain at an 8.  It is a sharp pain.  General activity level is 0- 6.  Pain is worse during the day.  Sleep patterns are good.  Some activities aggravate.  Rest, medication tend to help.  Mobility, she is independent.  Functionally, she is spine.  REVIEW OF SYSTEMS:  Notable for difficulties described above, otherwise without any other difficulty.  PAST MEDICAL HISTORY, SOCIAL HISTORY AND FAMILY HISTORY:  Unchanged.  PHYSICAL EXAM:  Her blood pressure 170/78, pulse 71, respirations 16, O2 sats 98 on room air.  Motor strength of the lower extremities good. Left upper extremity is about 4+5, right extremity is very limited.  She is having quite a bit of exquisite pain there.  Sensation is intact. Constitutional, she is within normal limits.  She has got normal gait. She is alert and oriented x3.  ASSESSMENT:  History of bilateral shoulder surgeries, osteoarthritis as well as torn rotator cuffs, most recently right biceps tendinitis with injection and acutely question right proximal biceps tendon rupture.  PLAN: 1. She will continue her current medications.  She just got those     filled.  No prescriptions were given today.  She will continue on     Demerol 50 mg t.i.d. as needed, Phenergan, Flexeril and Voltaren     gel. 2. I did speak with Dr. Loleta Books of [facility] who is     going to see her in acute  care clinic today.  We will hold off on     MRI and he will make decision of diagnostics if he needs.  We will     see her back here in 1 month.  Her questions were encouraged and     answered.     Anis Degidio L. Blima Dessert Electronically Signed    RLW/MedQ D:  08/15/2011 14:57:20  T:  08/15/2011 20:45:11  Job #:  [id number]

## 2011-09-02 ENCOUNTER — Ambulatory Visit (HOSPITAL_BASED_OUTPATIENT_CLINIC_OR_DEPARTMENT_OTHER): Payer: 59 | Admitting: Physical Medicine & Rehabilitation

## 2011-09-02 DIAGNOSIS — M19029 Primary osteoarthritis, unspecified elbow: Secondary | ICD-10-CM

## 2011-09-02 DIAGNOSIS — S43429A Sprain of unspecified rotator cuff capsule, initial encounter: Secondary | ICD-10-CM

## 2011-09-03 NOTE — Assessment & Plan Note (Signed)
HISTORY:  This is a patient Dr. Wynn Banker, has been seen for shoulder pain bilaterally.  She was seen last month by me, she has some right shoulder issues as well as some biceps problems and I thought she had a tear.  We referred her over to Dr. Beverely Low, [facility], who evaluated and agreed that she may very well have a biceps tear, although he is not going to try surgically repaired.  He did inject and she states she is feeling better with that though she does still have some spasms with lot of use of that arm.  She also asked about her right shoulder injection today.  She states that Dr. Wynn Banker talked to her about reinject in that shoulder but she feels like it is also somewhat better and she would like to hold off until it will be fine.  She rates her average pain is 3, it is intermittent and aching pain.  General activity level is 0-1.  Pain is worse during the day and evening.  Sleep pattern is good.  Pain is worse with standing and activity.  Medications and injections helped.  She can walk without assistance about 30 minutes at a time.  She climbs steps and drives.  She is retired.  REVIEW OF SYSTEMS:  Notable for difficulties described above, otherwise within normal limits.  No aberrant behaviors.  Pill counts correct. Last UDS was consistent.  PAST MEDICAL HISTORY, SOCIAL HISTORY, AND FAMILY HISTORY:  Unchanged.  PHYSICAL EXAMINATION:  VITAL SIGNS:  Blood pressure 128/73, pulse 68, respirations 14, and O2 sats 98 on room air. MUSCULOSKELETAL:  Motor strength and sensation are intact in upper extremities.  We did not really confrontationally test her due to the fact that she possibly has a new tear on the right side, the pain she is having in the left due to more aggravate.  Sensation is intact. CONSTITUTIONAL:  She is within normal limits.  She is alert and oriented x3.  She has normal gait.  ASSESSMENT:  Right shoulder impingement syndrome/osteoarthritis  with a history of surgeries.  Has seen Dr. [name] and Dr. Beverely Low.  PLAN: 1. We are going to start her on some Relafen 500 mg 1 p.o. once a day     to b.i.d. 60 with 3 refills 2. Refill her Demerol 50 mg 1 p.o. t.i.d. 90 with no refill.  Questions were encouraged and answered.  We will see her back in 3 or 4 weeks, and if she needs injection at that time I will take care for her, she is in agreement with this plan.     Jenna [name] L. Blima Dessert     ______________________________ Erick Colace, M.D.   RLW/MedQ D:  09/02/2011 14:28:37  T:  09/02/2011 23:57:19  Job #:  [id number]

## 2011-09-29 ENCOUNTER — Encounter: Payer: Medicare Other | Admitting: Neurosurgery

## 2011-10-06 ENCOUNTER — Encounter: Payer: Medicare Other | Attending: Neurosurgery | Admitting: Neurosurgery

## 2011-10-06 DIAGNOSIS — G894 Chronic pain syndrome: Secondary | ICD-10-CM

## 2011-10-06 DIAGNOSIS — M25519 Pain in unspecified shoulder: Secondary | ICD-10-CM | POA: Insufficient documentation

## 2011-10-06 DIAGNOSIS — M259 Joint disorder, unspecified: Secondary | ICD-10-CM | POA: Insufficient documentation

## 2011-10-06 DIAGNOSIS — M25529 Pain in unspecified elbow: Secondary | ICD-10-CM

## 2011-10-06 DIAGNOSIS — M719 Bursopathy, unspecified: Secondary | ICD-10-CM | POA: Insufficient documentation

## 2011-10-06 DIAGNOSIS — M67919 Unspecified disorder of synovium and tendon, unspecified shoulder: Secondary | ICD-10-CM | POA: Insufficient documentation

## 2011-10-06 DIAGNOSIS — M19029 Primary osteoarthritis, unspecified elbow: Secondary | ICD-10-CM | POA: Insufficient documentation

## 2011-10-07 NOTE — Procedures (Signed)
Jenna [name], [name]              ACCOUNT NO.:  [id number]  MEDICAL RECORD NO.:  [id number]           PATIENT TYPE:  O  LOCATION:  [facility]                          FACILITY:  [facility]  PHYSICIAN:  Erick Colace, M.D.DATE OF BIRTH:  15-Dec-1932  DATE OF PROCEDURE:  10/06/2011 DATE OF DISCHARGE:                              OPERATIVE REPORT  HISTORY OF PRESENT ILLNESS:  This is a patient of Dr. Wynn Banker who is seen today for left shoulder pain.  She is asking for an injection.  She does have a history of shoulder problems and surgeries, was most recently evaluated by Dr. Malon Kindle for her right biceps tendon problem.  Average pain is 3-5 sharp aching type pain.  General activity level is 1-3.  Pain is worse during the day.  Sleep patterns are good. Standing and activity aggravate.  Rest, medication, and injections help. She walks without assistance.  She cannot drive.  She walk up 30 minutes at a time.  She is retired.  REVIEW OF SYSTEMS:  Notable for difficulties described above, otherwise, within normal limits.  PAST MEDICAL HISTORY:  Unchanged.  SOCIAL HISTORY:  Unchanged.  FAMILY HISTORY:  Unchanged.  PHYSICAL EXAMINATION:  Blood pressure is 136/52, pulse 63, respirations 16, O2 sats 100 on room air.  Motor strength is somewhat diminished in upper extremities due to her shoulder pain.  Sensation is intact. Constitutionally, she is within normal limits.  She is alert and oriented x3.  ASSESSMENT: 1. History of left rotator cuff syndrome. 2. Right biceps tenosynovitis. 3. Left humeral arthritis with crepitus.  PLAN:  After informed consent and the risks and benefits were explained to the patient to include infection, bleeding, and possible nerve damage, I injected the posterior aspect of her AC joint on the left side with 3 mL of lidocaine and 1 mL of 40 mg of Depo-Medrol.  She tolerated it well.  There is no blood drawback.  She knows to ice the area tonight and not  use her arm a whole lot.  I did give her refill for Demerol 50 mg 1 p.o. t.i.d. 90 with no refill.  Dr. Wynn Banker will see her in 1 month to discuss possible epidural steroid injection.  She is in agreement with this plan.     Jenna [name] L. Jenna [name]     ______________________________ Erick Colace, M.D.   RLW/MEDQ  D:  10/06/2011 14:06:11  T:  10/07/2011 02:07:58  Job:  [id number]

## 2011-11-11 ENCOUNTER — Ambulatory Visit (HOSPITAL_BASED_OUTPATIENT_CLINIC_OR_DEPARTMENT_OTHER): Payer: Medicare Other | Admitting: Physical Medicine & Rehabilitation

## 2011-11-11 ENCOUNTER — Encounter: Payer: Medicare Other | Attending: Physical Medicine & Rehabilitation

## 2011-11-11 DIAGNOSIS — M47817 Spondylosis without myelopathy or radiculopathy, lumbosacral region: Secondary | ICD-10-CM | POA: Insufficient documentation

## 2011-11-11 DIAGNOSIS — M67919 Unspecified disorder of synovium and tendon, unspecified shoulder: Secondary | ICD-10-CM | POA: Insufficient documentation

## 2011-11-11 DIAGNOSIS — M25519 Pain in unspecified shoulder: Secondary | ICD-10-CM | POA: Insufficient documentation

## 2011-11-11 DIAGNOSIS — IMO0002 Reserved for concepts with insufficient information to code with codable children: Secondary | ICD-10-CM | POA: Insufficient documentation

## 2011-11-11 DIAGNOSIS — M719 Bursopathy, unspecified: Secondary | ICD-10-CM | POA: Insufficient documentation

## 2011-11-11 NOTE — Assessment & Plan Note (Signed)
This 78 year old female with lumbar spondylosis.  She has had some increasing pain over the last couple weeks and it really started getting worse after she helped her mother move.  She has her pain earlier in the day about 4/10, but by the evening about 6/10.  Her Oswestry is unchanged at 34%.  She has pain that interferes with activity at a moderate level.  Sleep is good.  Pain improves with rest and medications.  Relief from meds is good.  She can walk 20-30 minutes at a time.  She climbs steps.  She drives.  REVIEW OF SYSTEMS:  Her shoulder pain is doing pretty well.  She has seen Dr. Ranell Patrick in the past.  No operative treatment for any type of biceps tear.  No other interventional procedures planned.  MEDICATIONS:  Demerol 50 mg t.i.d. as well as Phenergan 25 b.i.d.  PHYSICAL EXAMINATION:  GENERAL:  No acute distress.  Mood and affect are appropriate. VITAL SIGNS:  Weight is 155 pounds, height 5 feet 2 inches, blood pressure 132/80, pulse 74, respiratory rate is 16 and O2 sat 100% on room air. BACK:  Has some tenderness to palpation in the lumbosacral junction. EXTREMITIES:  She has negative straight leg raising test.  Her shoulder range of motion is normal on the right side.  On the left side, she lacks about 10 degrees from full forward flexion and abduction.  Upper extremity strength is normal.  There is no evidence of a retracted biceps on the right side.  No evidence of ecchymoses.  Her sensation is normal in the upper extremities. Gait is normal.  IMPRESSION: 1. Lumbar spondylosis.  She has pain with extension greater than with     flexion.  No radicular symptomatology.  I think she would benefit     from repeat medial branch block.  Last one was done about a year     and half ago 2. Shoulder pain postoperative chronic left upper extremity and on the     right upper extremity, she has a history of a bicipital tendinitis     as well as biceps tear in addition to rotator  cuff tear.  PLAN: 1. We will repeat injection in the low back as above. 2. Continue her Demerol 50 mg t.i.d. 3. Continue Phenergan 25 b.i.d.     Erick Colace, M.D. Electronically Signed    AEK/MedQ D:  11/11/2011 14:08:01  T:  11/11/2011 22:39:58  Job #:  [id number]

## 2011-11-27 ENCOUNTER — Encounter (HOSPITAL_BASED_OUTPATIENT_CLINIC_OR_DEPARTMENT_OTHER): Payer: Medicare Other | Admitting: Physical Medicine & Rehabilitation

## 2011-11-27 DIAGNOSIS — M47817 Spondylosis without myelopathy or radiculopathy, lumbosacral region: Secondary | ICD-10-CM

## 2011-11-27 NOTE — Procedures (Signed)
[name], Jenna [name]              ACCOUNT NO.:  [id number]  MEDICAL RECORD NO.:  [id number]           PATIENT TYPE:  O  LOCATION:  [facility]                          FACILITY:  [facility]  PHYSICIAN:  Erick Colace, M.D.DATE OF BIRTH:  05-28-1933  DATE OF PROCEDURE:  11/27/2011 DATE OF DISCHARGE:                              OPERATIVE REPORT  REASON FOR VISIT:  Back pain.  PROCEDURE:  Bilateral L5 dorsal ramus injection, bilateral L4 and L3 medial branch blocks.  INDICATION:  Lumbar spondylosis.  She has pain that only partially responsive to medication management including narcotic analgesics and interferes with activities.  Her pain score is currently at a 4.  She has pain that increases with walking and standing.  She is currently sitting.  Informed consent was obtained after describing risks and benefits of the procedure with the patient.  These include bleeding, bruising and infection.  She elects to proceed and has given written consent.  The patient placed prone on fluoroscopy table.  Betadine prep, sterile drape, 25-gauge inch and half needle was used to anesthetize the skin and subcu tissue, 1% lidocaine x2 mL.  Then, a 22-gauge, 3.5-inch spinal needle was inserted under fluoroscopic guidance starting at the left S1 SAP transverse process junction, bone contact made, confirmed with lateral imaging.  Omnipaque 180 x 0.5 mL demonstrated no intravascular uptake.  Then, 0.5 mL of solution containing 1 mL of 4 mg/mL dexamethasone and 3 mL of 1% lidocaine were injected.  Then, the left L5 SAP transverse process junction targeted, bone contact made.  Omnipaque 180 x 0.5 mL demonstrated no intravascular uptake.  Then, 0.5 mL of the dexamethasone lidocaine solution was injected.  Then, the left L4 SAP transverse process junction targeted, bone contact made.  Omnipaque 180 x 0.5 mL demonstrated no intravascular uptake.  Then, 0.5 mL of the dexamethasone lidocaine solution was  injected.  The same procedure was repeated on the right side using same needle, injectate and technique. The patient tolerated the procedure well.  Pre and post injection vitals stable.  Post injection instructions given.  She is to follow up in 2-3 weeks.  I will see how she did with this injection and see whether she needs to proceed on to a radiofrequency procedure.     Erick Colace, M.D. Electronically Signed    AEK/MEDQ  D:  11/27/2011 13:37:31  T:  11/27/2011 18:49:17  Job:  [id number]

## 2011-12-16 ENCOUNTER — Encounter: Payer: Medicare Other | Attending: Physical Medicine & Rehabilitation

## 2011-12-16 ENCOUNTER — Ambulatory Visit (HOSPITAL_BASED_OUTPATIENT_CLINIC_OR_DEPARTMENT_OTHER): Payer: Medicare Other | Admitting: Physical Medicine & Rehabilitation

## 2011-12-16 DIAGNOSIS — M67919 Unspecified disorder of synovium and tendon, unspecified shoulder: Secondary | ICD-10-CM | POA: Insufficient documentation

## 2011-12-16 DIAGNOSIS — M25519 Pain in unspecified shoulder: Secondary | ICD-10-CM | POA: Insufficient documentation

## 2011-12-16 DIAGNOSIS — M47817 Spondylosis without myelopathy or radiculopathy, lumbosacral region: Secondary | ICD-10-CM

## 2011-12-16 DIAGNOSIS — IMO0002 Reserved for concepts with insufficient information to code with codable children: Secondary | ICD-10-CM | POA: Insufficient documentation

## 2011-12-16 DIAGNOSIS — M19029 Primary osteoarthritis, unspecified elbow: Secondary | ICD-10-CM

## 2011-12-16 DIAGNOSIS — M719 Bursopathy, unspecified: Secondary | ICD-10-CM | POA: Insufficient documentation

## 2011-12-19 NOTE — Assessment & Plan Note (Signed)
REASON FOR VISIT:  Left shoulder pain and back pain.  A 78 year old female who has had good response with lumbar medial branch blocks bilateral L5, L4, and L3, last performed on November 27, 2011. She had her prior medial branch blocks done on April 12, 2010 and it did last quite a long time.  She has noticed not only her back pain improved, but also her thigh pain.  Her pain level last time was 4-6, and now she is at 3.  Her walking tolerance is 20-30 minutes.  She can climb steps.  She can drive.  She is independent with her ADLs.  PHYSICAL EXAMINATION:  VITAL SIGNS:  Blood pressure 130/57, pulse 70, respiratory rate is 14, O2 saturation 97% on room air, weight 155 pounds, height 5 feet 2-1/2 inches. GENERAL:  No acute distress.  Orientation x3.  Affect, alert.  Gait is normal. MUSCULOSKELETAL:  Her lumbar spine range of motion is normal with exception of extension, which is accompanied by pain.  It gets about 50% of normal range.  Her hip flexor, knee extensor, ankle dorsiflexor strength is normal.  Deep tendon reflexes are reduced on left lower extremity at the knee and ankle, normal on the right at the knee and reduced in the right ankle.  Gait is normal.  Mood and affect are appropriate.  IMPRESSION: 1. Lumbar spondylosis without myelopathy. 2. Shoulder pain, bilateral, status post total shoulder replacement on     the right and has osteoarthritis on the left with rotator cuff     tear.  PLAN:  We will see her back in 2 months, nursing visit at 1 month.  She is to call if her injection wears off on her low back area to repeat if we need to or consider radiofrequency.     Erick Colace, M.D. Electronically Signed    AEK/MedQ D:  12/16/2011 13:56:23  T:  12/16/2011 15:03:29  Job #:  [id number]

## 2012-01-14 ENCOUNTER — Encounter: Payer: Medicare Other | Attending: Physical Medicine & Rehabilitation | Admitting: *Deleted

## 2012-01-14 ENCOUNTER — Encounter: Payer: Self-pay | Admitting: *Deleted

## 2012-01-14 VITALS — BP 144/70 | HR 71 | Resp 18 | Ht 62.5 in | Wt 156.0 lb

## 2012-01-14 DIAGNOSIS — M25529 Pain in unspecified elbow: Secondary | ICD-10-CM

## 2012-01-14 DIAGNOSIS — M25519 Pain in unspecified shoulder: Secondary | ICD-10-CM | POA: Insufficient documentation

## 2012-01-14 DIAGNOSIS — G8928 Other chronic postprocedural pain: Secondary | ICD-10-CM

## 2012-01-14 DIAGNOSIS — M47817 Spondylosis without myelopathy or radiculopathy, lumbosacral region: Secondary | ICD-10-CM | POA: Insufficient documentation

## 2012-01-14 DIAGNOSIS — Z9889 Other specified postprocedural states: Secondary | ICD-10-CM | POA: Insufficient documentation

## 2012-01-14 DIAGNOSIS — G8929 Other chronic pain: Secondary | ICD-10-CM | POA: Insufficient documentation

## 2012-01-14 MED ORDER — MEPERIDINE HCL 50 MG PO TABS: 50.0000 mg | ORAL_TABLET | Freq: Three times a day (TID) | ORAL | Status: DC

## 2012-01-14 NOTE — Progress Notes (Signed)
Continued c/o left shoulder pain. Contemplating surgery, although she knows her rotator cuff tear is unrepairable. Has been doing some extra activity helping her mother in assisted living. Voiced no questions for MD. Opioid Informed Consent signed today.

## 2012-02-05 ENCOUNTER — Encounter: Payer: Self-pay | Admitting: Physical Medicine & Rehabilitation

## 2012-02-10 ENCOUNTER — Encounter: Payer: Medicare Other | Attending: Physical Medicine & Rehabilitation

## 2012-02-10 ENCOUNTER — Encounter: Payer: Self-pay | Admitting: Physical Medicine & Rehabilitation

## 2012-02-10 ENCOUNTER — Ambulatory Visit (HOSPITAL_BASED_OUTPATIENT_CLINIC_OR_DEPARTMENT_OTHER): Payer: Medicare Other | Admitting: Physical Medicine & Rehabilitation

## 2012-02-10 VITALS — BP 128/56 | HR 74 | Resp 16 | Ht 62.5 in | Wt 153.0 lb

## 2012-02-10 DIAGNOSIS — IMO0002 Reserved for concepts with insufficient information to code with codable children: Secondary | ICD-10-CM | POA: Insufficient documentation

## 2012-02-10 DIAGNOSIS — M67919 Unspecified disorder of synovium and tendon, unspecified shoulder: Secondary | ICD-10-CM | POA: Insufficient documentation

## 2012-02-10 DIAGNOSIS — M47816 Spondylosis without myelopathy or radiculopathy, lumbar region: Secondary | ICD-10-CM | POA: Insufficient documentation

## 2012-02-10 DIAGNOSIS — M75101 Unspecified rotator cuff tear or rupture of right shoulder, not specified as traumatic: Secondary | ICD-10-CM | POA: Insufficient documentation

## 2012-02-10 DIAGNOSIS — M719 Bursopathy, unspecified: Secondary | ICD-10-CM | POA: Insufficient documentation

## 2012-02-10 DIAGNOSIS — M47817 Spondylosis without myelopathy or radiculopathy, lumbosacral region: Secondary | ICD-10-CM

## 2012-02-10 DIAGNOSIS — G8929 Other chronic pain: Secondary | ICD-10-CM | POA: Insufficient documentation

## 2012-02-10 DIAGNOSIS — M25519 Pain in unspecified shoulder: Secondary | ICD-10-CM | POA: Insufficient documentation

## 2012-02-10 DIAGNOSIS — M12519 Traumatic arthropathy, unspecified shoulder: Secondary | ICD-10-CM

## 2012-02-10 DIAGNOSIS — M12811 Other specific arthropathies, not elsewhere classified, right shoulder: Secondary | ICD-10-CM

## 2012-02-10 DIAGNOSIS — G8928 Other chronic postprocedural pain: Secondary | ICD-10-CM

## 2012-02-10 MED ORDER — MEPERIDINE HCL 50 MG PO TABS: 50.0000 mg | ORAL_TABLET | Freq: Three times a day (TID) | ORAL | Status: DC

## 2012-02-10 NOTE — Progress Notes (Signed)
  Subjective:    Patient ID: Jenna [name], female    DOB: 1933-01-10, 78 y.o.   MRN: [id number]  HPI  Left shoulder is hurting more than right shoulder. Last shoulder injection was in December of 2012. Back pain is still doing relatively well after medial branch blocks performed in January of 2013. Pain Inventory Average Pain 4 Pain Right Now 3 My pain is intermittent and aching  In the last 24 hours, has pain interfered with the following? General activity 2 Relation with others 0 Enjoyment of life 2 What TIME of day is your pain at its worst? Daytime and Evening Sleep (in general) Good  Pain is worse with: walking, standing and some activites Pain improves with: rest, medication and injections Relief from Meds: 7  Mobility walk without assistance how many minutes can you walk? 30 ability to climb steps?  yes do you drive?  yes  Function retired  Neuro/Psych No problems in this area  Prior Studies Any changes since last visit?  no  Physicians involved in your care Any changes since last visit?  no  Review of Systems  Constitutional: Negative.   HENT: Negative.   Eyes: Negative.   Respiratory: Negative.   Cardiovascular: Negative.   Gastrointestinal: Negative.   Genitourinary: Negative.   Musculoskeletal: Negative.   Skin: Negative.   Neurological: Negative.   Hematological: Negative.   Psychiatric/Behavioral: Negative.        Objective:   Physical Exam  Constitutional: She appears well-developed and well-nourished.  HENT:  Head: Normocephalic and atraumatic.  Eyes: Conjunctivae are normal. Pupils are equal, round, and reactive to light.  Neck: Normal range of motion.  Musculoskeletal:       Right shoulder: She exhibits decreased range of motion and pain. She exhibits no tenderness.       Left shoulder: She exhibits decreased range of motion and pain. She exhibits no tenderness.       Cervical back: Normal.       Lumbar back: She exhibits  decreased range of motion.       Back has decreased range of motion with lumbar extension Shoulder has positive impingement sign bilaterally at 90. Also pain with a.c. joint adduction on the left side.  Psychiatric: She has a normal mood and affect. Her behavior is normal. Judgment and thought content normal.          Assessment & Plan:  And. Lumbar spondylosis without evidence of radiculopathy. No further medial branch blocks needed at this time but can be repeated if needed in the next month or 2.  2. Chronic shoulder pain status post open rotator cuff surgery times 2 on the left and x1 on the right. She's also had a right bicipital tendon tear in the past. Will hold off on any further injections at this time but if shoulder pain flares up we can do this again either with or without ultrasound guidance

## 2012-02-10 NOTE — Patient Instructions (Signed)
Call for lumbar medial branch block if back pain starts to flareup again

## 2012-03-18 ENCOUNTER — Encounter: Payer: Medicare Other | Attending: Physical Medicine & Rehabilitation

## 2012-03-18 ENCOUNTER — Ambulatory Visit (HOSPITAL_BASED_OUTPATIENT_CLINIC_OR_DEPARTMENT_OTHER): Payer: Medicare Other | Admitting: Physical Medicine & Rehabilitation

## 2012-03-18 ENCOUNTER — Encounter: Payer: Self-pay | Admitting: Physical Medicine & Rehabilitation

## 2012-03-18 VITALS — BP 122/53 | HR 62 | Resp 14 | Ht 62.0 in | Wt 152.0 lb

## 2012-03-18 DIAGNOSIS — IMO0002 Reserved for concepts with insufficient information to code with codable children: Secondary | ICD-10-CM | POA: Insufficient documentation

## 2012-03-18 DIAGNOSIS — M719 Bursopathy, unspecified: Secondary | ICD-10-CM | POA: Insufficient documentation

## 2012-03-18 DIAGNOSIS — M47817 Spondylosis without myelopathy or radiculopathy, lumbosacral region: Secondary | ICD-10-CM | POA: Insufficient documentation

## 2012-03-18 DIAGNOSIS — M67919 Unspecified disorder of synovium and tendon, unspecified shoulder: Secondary | ICD-10-CM | POA: Insufficient documentation

## 2012-03-18 DIAGNOSIS — M25519 Pain in unspecified shoulder: Secondary | ICD-10-CM | POA: Insufficient documentation

## 2012-03-18 DIAGNOSIS — M47816 Spondylosis without myelopathy or radiculopathy, lumbar region: Secondary | ICD-10-CM

## 2012-03-18 MED ORDER — MEPERIDINE HCL 50 MG PO TABS: 50.0000 mg | ORAL_TABLET | Freq: Three times a day (TID) | ORAL | Status: DC

## 2012-03-18 NOTE — Patient Instructions (Signed)
Facet Block A facet block is an injection procedure used to numb nerves near a spinal joint (facet). The injection usually includes a medicine like Novacaine (anesthetic) and a steroid medicine (similar to cortisone). The injections are made directly into the facet joint of the back. They are used for patients with several types of neck or back pain problems (such as worsening arthritis or persistent pain after surgery) that have not been helped with anti-inflammatory medications, exercise programs, physical therapy, and other forms of pain management. Multiple injections may be needed depending on how many joints are involved.  A facet block procedure can be helpful with diagnosis as well as providing therapeutic pain relief. One of three things may happen after the procedure:  The pain does not go away. This can mean that the pain is probably not coming from blocked facet joints. This information is helpful with diagnosis.   The pain goes away and stays away for a few hours but the original pain comes back and does not get better again. This information is also helpful with diagnosis. It can mean that pain is probably coming from the joints; but the steroid was not helpful for longer term pain control.   The pain goes away after the block, then returns later that day, and then gets better again over the next few days. This can mean that the block was helpful for pain control and the steroid had a longer lasting effect.  If there is good, lasting benefit from the injections, the block may be repeated from 3 to 5 times. If there is good relief but it is only of short-term benefit, other procedures (such as radiofrequency lesioning) may be considered.  Note: The procedure cannot be performed if you have an active infection, a lesion on or near the area of injection, flu, cold, fever, very high blood pressure or if you are on blood thinners. Please make your doctor aware of any of these conditions. This is  for your safety!  LET YOUR CAREGIVER KNOW ABOUT:   Allergies.   Medications taken including herbs, eye drops, over the counter medications, and creams   Use of steroids (by mouth or creams).   Possible pregnancy, if applicable.   Previous problems with anesthetics or Novocaine.   History of blood clots.   History of bleeding or blood problems.   Previous surgery, particularly of the neck and/or back   Other health problems.  RISKS AND COMPLICATIONS These are very uncommon but include:  Bleeding.   Injury to a nerve near the injection site.   Weakness or numbness in areas controlled by nerves near the injection site.   Infection.   Pain at the site of the injection.   Temporary fluid retention in those who are prone to this problem.   Allergic reaction to anesthetics or medicines used during the procedure.  Diabetics may have a temporary increase in their blood sugar after any surgical procedure, especially if steroids are used. Stinging/burning of the numbing medicine is the most uncomfortable part of the procedure; however every person's response to any procedure is individual.  BEFORE THE PROCEDURE   Your caregiver will provide instructions about stopping any medication before the procedure.   Unless advised otherwise, if the injections are in your neck, you may take your medications as usual with a sip of water but do not eat or drink for 6 hours before the procedure.   Unless advised otherwise, you may eat, drink and take your medications as   usual on the day of the procedure (both before and after) if the injections are to be in your lower back.   There is no other specific preparation necessary unless advised otherwise.  PROCEDURE After checking your blood pressure, the procedure will be done in the x-ray (fluoroscopy) room while lying on your stomach. For procedures in the neck, an intravenous line is usually started. The back is then cleansed with an antiseptic  soap. Sterile drapes are placed in this area. The skin is numbed with a local anesthetic. This is felt as a stinging or burning sensation. Using x-ray guidance, needles are then advanced to the appropriate locations. Once the needles are in the proper location, the anesthetic and steroid is injected through the needles and the needles are removed. The skin is then cleansed and bandages are applied. Blood pressure will be checked again, and you will be discharged to leave with your ride after your caregiver says it is okay to go.  AFTER THE PROCEDURE  You may not drive for the remainder of the day after your procedure. An adult must be present to drive you home or to go with you in a taxi or on public transportation. The procedure will be canceled if you do not have a responsible adult with you! This is for your safety.  HOME CARE INSTRUCTIONS   The bandages noted above can be removed on the morning after the procedure.   Resume medications according to your caregiver's instructions.   No heat is to be used near or over the injected area(s) for the remainder of the day.   No tub bath or soaking in water (such as a pool, jacuzzi, etc.) for the remainder of the day.   Some local tenderness may be experienced for a couple of days after the injection. Using an ice pack three or four times a day will help this.   Keep track of the amount of pain relief as well as how long the pain relief lasted.  SEEK MEDICAL CARE IF:   There is drainage from the injection site.   Pain is not controlled with medications prescribed.   There is significant bleeding or swelling.  SEEK IMMEDIATE MEDICAL CARE IF:   You develop a fever of 101 F (38.3 C) or greater.   Worsening pain, swelling, and/or red streaking develops in the skin around the injection site.   Severe pain develops and cannot be controlled with medications prescribed.   You develop any headache, stiff neck, nausea, vomiting, or your eyes become  very sensitive to light.   Weakness or paralysis develops in arms or legs not present before the procedure.   You develop difficulty urinating or difficulty breathing.  Document Released: 03/11/2007 Document Revised: 10/09/2011 Document Reviewed: 03/01/2009 [employer] Patient Information 2012 [employer]. 

## 2012-03-18 NOTE — Progress Notes (Signed)
  PROCEDURE RECORD [facility]   Name: JANNIS ATKINS DOB:06-14-1933 MRN: [id number]  Date:03/18/2012  Physician: Claudette Laws, MD    Nurse/CMA: Kelli Churn  Allergies:  Allergies  Allergen Reactions  . Codeine Shortness Of Breath  . Darvon Shortness Of Breath  . Tramadol Shortness Of Breath  . Tylenol (Acetaminophen) Shortness Of Breath  . Vicodin (Hydrocodone-Acetaminophen) Shortness Of Breath  . Dalmane (Flurazepam Hcl) Other (See Comments)    confusion  . Cephalexin Nausea Only  . Clarithromycin Nausea Only  . Doxycycline Calcium Rash    Consent Signed: no  Is patient diabetic? no  CBG today?   Pregnant: no LMP: No LMP recorded. Patient has had a hysterectomy. (age 78-55)  Anticoagulants: no Anti-inflammatory: no Antibiotics: no  Procedure: Medial Branch Block  Position: Prone Start Time:  10:57 am End Time: 11:05 am  Fluoro Time: 19  RN/CMA Brenen Beigel Tychelle Purkey    Time 10:38 am 11:10 am    BP 122/53 136/57    Pulse 62 67    Respirations 14 14    O2 Sat 97 97    S/S 6 6    Pain Level 3 3     D/C home with Bill-husband, [name], D/C instructions reviewed, and sits independently.

## 2012-03-18 NOTE — Progress Notes (Signed)
Right lumbar L3, L4 medial branch blocks and L5 dorsal ramus injection under fluoroscopic guidance  Indication: Right Lumbar pain which is not relieved by medication management or other conservative care and interfering with self-care and mobility.  Informed consent was obtained after describing risks and benefits of the procedure with the patient, this includes bleeding, bruising, infection, paralysis and medication side effects. The patient wishes to proceed and has given written consent. The patient was placed in a prone position. The lumbar area was marked and prepped with Betadine. One ML of 1% lidocaine was injected into each of 3 areas into the skin and subcutaneous tissue. Then a 22-gauge 3.5 inch spinal needle was inserted targeting the junction of the Right S1 superior articular process and sacral ala junction. Needle was advanced under fluoroscopic guidance. Bone contact was made. Omnipaque 180 was injected x0.5 mL demonstrating no intravascular uptake. Then a solution containing one ML of 4 mg per mL dexamethasone and 3 mL of 2% MPF lidocaine was injected x0.5 mL. Then the Right L5 superior articular process in transverse process junction was targeted. Bone contact was made. Omnipaque 180 was injected x0.5 mL demonstrating no intravascular uptake. Then a solution containing one ML of 4 mg per mL dexamethasone and 3 mL of 2% MPF lidocaine was injected x0.5 mL. Then the Right L4 superior articular process in transverse process junction was targeted. Bone contact was made. Omnipaque 180 was injected x0.5 mL demonstrating no intravascular uptake. Then a solution containing one ML of 4 mg per mL dexamethasone and 3 mL of 2% MPF lidocaine was injected x0.5 mL Patient tolerated procedure well. Post procedure instructions were given. Please refer to post procedure form. 

## 2012-04-20 ENCOUNTER — Encounter: Payer: Self-pay | Admitting: Physical Medicine and Rehabilitation

## 2012-04-20 ENCOUNTER — Encounter: Payer: Medicare Other | Attending: Physical Medicine & Rehabilitation | Admitting: Physical Medicine and Rehabilitation

## 2012-04-20 VITALS — BP 137/71 | HR 66 | Resp 14 | Ht 62.0 in | Wt 152.0 lb

## 2012-04-20 DIAGNOSIS — M47817 Spondylosis without myelopathy or radiculopathy, lumbosacral region: Secondary | ICD-10-CM | POA: Insufficient documentation

## 2012-04-20 DIAGNOSIS — M25519 Pain in unspecified shoulder: Secondary | ICD-10-CM | POA: Insufficient documentation

## 2012-04-20 DIAGNOSIS — G8929 Other chronic pain: Secondary | ICD-10-CM | POA: Insufficient documentation

## 2012-04-20 DIAGNOSIS — M545 Low back pain, unspecified: Secondary | ICD-10-CM | POA: Insufficient documentation

## 2012-04-20 DIAGNOSIS — M25512 Pain in left shoulder: Secondary | ICD-10-CM

## 2012-04-20 MED ORDER — CYCLOBENZAPRINE HCL 5 MG PO TABS: 5.0000 mg | ORAL_TABLET | Freq: Every day | ORAL | Status: DC | PRN

## 2012-04-20 MED ORDER — MEPERIDINE HCL 50 MG PO TABS: 50.0000 mg | ORAL_TABLET | Freq: Three times a day (TID) | ORAL | Status: DC

## 2012-04-20 NOTE — Patient Instructions (Addendum)
Continue with exercising. Continue with medication.Start walking program.

## 2012-04-20 NOTE — Progress Notes (Signed)
Subjective:    Patient ID: Jenna [name], female    DOB: 1932-12-12, 78 y.o.   MRN: [id number]  HPI The patient complains about chronic low back pain which radiates into right leg intermittendly.   The problem has  improved.  MBB, has given her relief about 40% or more. Her shoulder pain is unchanged.  Pain Inventory Average Pain 4 Pain Right Now 2 My pain is intermittent, sharp and aching  In the last 24 hours, has pain interfered with the following? General activity 3 Relation with others 0 Enjoyment of life 2 What TIME of day is your pain at its worst? day, evening Sleep (in general) Good  Pain is worse with: standing and some activites Pain improves with: rest, pacing activities and medication Relief from Meds: 7  Mobility walk without assistance how many minutes can you walk? 30 ability to climb steps?  yes do you drive?  yes Do you have any goals in this area?  yes  Function retired Do you have any goals in this area?  no  Neuro/Psych No problems in this area  Prior Studies Any changes since last visit?  no  Physicians involved in your care Any changes since last visit?  no   History reviewed. No pertinent family history. History   Social History  . Marital Status: Married    Spouse Name: N/A    Number of Children: N/A  . Years of Education: N/A   Social History Main Topics  . Smoking status: Former Games developer  . Smokeless tobacco: Former Neurosurgeon    Quit date: 01/14/1984  . Alcohol Use: 2.5 oz/week    5 drink(s) per week     occ wine w/ supper  . Drug Use: No  . Sexually Active: None   Other Topics Concern  . None   Social History Narrative  . None   History reviewed. No pertinent past surgical history. Past Medical History  Diagnosis Date  . Lumbosacral spondylosis without myelopathy   . Pain in joint, upper arm   . Other chronic postoperative pain   . Spinal stenosis, lumbar region, without neurogenic claudication   . Osteoarthritis     BP 137/71  Pulse 66  Resp 14  Ht 5\' 2"  (1.575 m)  Wt 152 lb (68.947 kg)  BMI 27.80 kg/m2     Review of Systems  All other systems reviewed and are negative.       Objective:   Physical Exam  Symmetric normal motor tone is noted throughout. Normal muscle bulk. Muscle testing reveals 5/5 muscle strength of the upper extremity, and 5/5 of the lower extremity. Full range of motion in upper and lower extremities, except external rotation in abduction of the left shoulder, abduction 80, external rotation 20 degrees. ROM of spine is  restricted. Fine motor movements are normal in both hands. Sensory is intact and symmetric to light touch, pinprick and proprioception. DTR in the upper and lower extremity are present and symmetric 2+. No clonus is noted.  Patient arises from chair without difficulty. Narrow based gait with normal arm swing bilateral .      Assessment & Plan:  This is a 78 year old female  with 1.Lumbar spondylosis without evidence of radiculopathy. S/p medial branch blocks,  got 40% or more relief after the injections.   2. Chronic shoulder pain status post open rotator cuff surgery times 2 on the left and x1 on the right. She's also had a right bicipital tendon tear in  the past. Showed and practice exercises for her shoulders and neck at this visit.   Plan : Patient should do those exercises we practice at home, she also showed try to continue with her walking program. We discussed that she showed get new walking shoes, and also might consider getting a new mattress. Follow up in in one month.

## 2012-04-26 ENCOUNTER — Telehealth: Payer: Self-pay | Admitting: *Deleted

## 2012-04-26 NOTE — Telephone Encounter (Signed)
Scheduled for an appointment on 05/10/12. She is wanting to know if she can have her L shoulder injected at that appointment.

## 2012-04-27 NOTE — Telephone Encounter (Signed)
Yes,

## 2012-04-27 NOTE — Telephone Encounter (Signed)
Pt husband aware of Dr. Wynn Banker approval.

## 2012-05-10 ENCOUNTER — Encounter: Payer: Self-pay | Admitting: Physical Medicine & Rehabilitation

## 2012-05-10 ENCOUNTER — Ambulatory Visit (HOSPITAL_BASED_OUTPATIENT_CLINIC_OR_DEPARTMENT_OTHER): Payer: Medicare Other | Admitting: Physical Medicine & Rehabilitation

## 2012-05-10 ENCOUNTER — Encounter: Payer: Medicare Other | Attending: Physical Medicine & Rehabilitation

## 2012-05-10 VITALS — BP 135/62 | HR 67 | Resp 16 | Ht 62.5 in | Wt 150.0 lb

## 2012-05-10 DIAGNOSIS — IMO0002 Reserved for concepts with insufficient information to code with codable children: Secondary | ICD-10-CM | POA: Insufficient documentation

## 2012-05-10 DIAGNOSIS — M25519 Pain in unspecified shoulder: Secondary | ICD-10-CM | POA: Insufficient documentation

## 2012-05-10 DIAGNOSIS — M12519 Traumatic arthropathy, unspecified shoulder: Secondary | ICD-10-CM

## 2012-05-10 DIAGNOSIS — M719 Bursopathy, unspecified: Secondary | ICD-10-CM | POA: Insufficient documentation

## 2012-05-10 DIAGNOSIS — M12811 Other specific arthropathies, not elsewhere classified, right shoulder: Secondary | ICD-10-CM

## 2012-05-10 DIAGNOSIS — M47817 Spondylosis without myelopathy or radiculopathy, lumbosacral region: Secondary | ICD-10-CM | POA: Insufficient documentation

## 2012-05-10 DIAGNOSIS — M67919 Unspecified disorder of synovium and tendon, unspecified shoulder: Secondary | ICD-10-CM | POA: Insufficient documentation

## 2012-05-10 DIAGNOSIS — M75101 Unspecified rotator cuff tear or rupture of right shoulder, not specified as traumatic: Secondary | ICD-10-CM

## 2012-05-10 MED ORDER — MEPERIDINE HCL 50 MG PO TABS: 50.0000 mg | ORAL_TABLET | Freq: Three times a day (TID) | ORAL | Status: DC

## 2012-05-10 NOTE — Patient Instructions (Signed)
Joint Injection Care After Refer to this sheet in the next few days. These instructions provide you with information on caring for yourself after you have had a joint injection. Your caregiver also may give you more specific instructions. Your treatment has been planned according to current medical practices, but problems sometimes occur. Call your caregiver if you have any problems or questions after your procedure. After any type of joint injection, it is not uncommon to experience:  Soreness, swelling, or bruising around the injection site.   Mild numbness, tingling, or weakness around the injection site caused by the numbing medicine used before or with the injection.  It also is possible to experience the following effects associated with the specific agent after injection:  Iodine-based contrast agents:   Allergic reaction (itching, hives, widespread redness, and swelling beyond the injection site).   Corticosteroids (These effects are rare.):   Allergic reaction.   Increased blood sugar levels (If you have diabetes and you notice that your blood sugar levels have increased, notify your caregiver).   Increased blood pressure levels.   Mood swings.   Hyaluronic acid in the use of viscosupplementation.   Temporary heat or redness.   Temporary rash and itching.   Increased fluid accumulation in the injected joint.  These effects all should resolve within a day after your procedure.  HOME CARE INSTRUCTIONS  Limit yourself to light activity the day of your procedure. Avoid lifting heavy objects, bending, stooping, or twisting.   Take prescription or over-the-counter pain medication as directed by your caregiver.   You may apply ice to your injection site to reduce pain and swelling the day of your procedure. Ice may be applied 3 to 4 times:   Put ice in a plastic bag.   Place a towel between your skin and the bag.   Leave the ice on for no longer than 15 to 20 minutes  each time.  SEEK IMMEDIATE MEDICAL CARE IF:   Pain and swelling get worse rather than better or extend beyond the injection site.   Numbness does not go away.   Blood or fluid continues to leak from the injection site.   You have chest pain.   You have swelling of your face or tongue.   You have trouble breathing or you become dizzy.   You develop a fever, chills, or severe tenderness at the injection site that last longer than 1 day.  MAKE SURE YOU:  Understand these instructions.   Watch your condition.   Get help right away if you are not doing well or if you get worse.  Document Released: 07/03/2011 Document Revised: 10/09/2011 Document Reviewed: 07/03/2011 [employer] Patient Information 2012 [employer]. 

## 2012-05-10 NOTE — Progress Notes (Signed)
  Subjective:    Patient ID: Jenna [name], female    DOB: 02/16/1933, 78 y.o.   MRN: [id number] Pain Inventory Average Pain 3 Pain Right Now 2 My pain is intermittent and aching  In the last 24 hours, has pain interfered with the following? General activity 1 Relation with others 0 Enjoyment of life 1 What TIME of day is your pain at its worst? daytime & evening Sleep (in general) Good  Pain is worse with: standing and some activites Pain improves with: rest and medication Relief from Meds: 7  Mobility walk with assistance how many minutes can you walk? 20-30 ability to climb steps?  yes do you drive?  yes Do you have any goals in this area?  yes  Function Do you have any goals in this area?  no  Neuro/Psych No problems in this area  Prior Studies Any changes since last visit?  no  Physicians involved in your care Any changes since last visit?  no   No family history on file. History   Social History  . Marital Status: Married    Spouse Name: N/A    Number of Children: N/A  . Years of Education: N/A   Social History Main Topics  . Smoking status: Former Games developer  . Smokeless tobacco: Former Neurosurgeon    Quit date: 01/14/1984  . Alcohol Use: 2.5 oz/week    5 drink(s) per week     occ wine w/ supper  . Drug Use: No  . Sexually Active: None   Other Topics Concern  . None   Social History Narrative  . None   History reviewed. No pertinent past surgical history. Past Medical History  Diagnosis Date  . Lumbosacral spondylosis without myelopathy   . Pain in joint, upper arm   . Other chronic postoperative pain   . Spinal stenosis, lumbar region, without neurogenic claudication   . Osteoarthritis    BP 135/62  Pulse 67  Resp 16  Ht 5' 2.5" (1.588 m)  Wt 150 lb (68.04 kg)  BMI 27.00 kg/m2  SpO2 100%   HPI    Review of Systems  Constitutional: Negative.   HENT: Negative.   Eyes: Negative.   Respiratory: Negative.   Cardiovascular: Negative.    Gastrointestinal: Negative.   Genitourinary: Negative.   Musculoskeletal: Positive for back pain and arthralgias.  Skin: Negative.   Neurological: Negative.  Negative for weakness.  Hematological: Negative.   Psychiatric/Behavioral: Negative.        Objective:   Physical Exam        Assessment & Plan:  1. Left shoulder pain with osteoarthritis  Ultrasound guided left shoulder joint injection Informed consent was obtained after describing risks and benefits of the procedure with the patient these include bleeding bruising and infection she elects to proceed and has given written consent. Patient placed in a supine position and then rotated slightly  Into a right lateral decubitus position with the arm in internal rotation. Linear transducer utilized. Humerus and acromion identified. 25-gauge inch needle was inserted into the joint space. Images saved. 1 cc of 40 November cc Depo-Medrol 4 cc 1% lidocaine injected. Patient tolerated procedure well.

## 2012-06-08 ENCOUNTER — Encounter
Payer: Medicare Other | Attending: Physical Medicine and Rehabilitation | Admitting: Physical Medicine and Rehabilitation

## 2012-06-08 ENCOUNTER — Encounter: Payer: Self-pay | Admitting: Physical Medicine and Rehabilitation

## 2012-06-08 VITALS — BP 133/71 | HR 67 | Resp 14 | Ht 62.0 in | Wt 151.0 lb

## 2012-06-08 DIAGNOSIS — M25512 Pain in left shoulder: Secondary | ICD-10-CM

## 2012-06-08 DIAGNOSIS — M25519 Pain in unspecified shoulder: Secondary | ICD-10-CM

## 2012-06-08 DIAGNOSIS — M47817 Spondylosis without myelopathy or radiculopathy, lumbosacral region: Secondary | ICD-10-CM | POA: Insufficient documentation

## 2012-06-08 DIAGNOSIS — M542 Cervicalgia: Secondary | ICD-10-CM

## 2012-06-08 MED ORDER — METHOCARBAMOL 500 MG PO TABS: 500.0000 mg | ORAL_TABLET | Freq: Three times a day (TID) | ORAL | Status: AC

## 2012-06-08 MED ORDER — MEPERIDINE HCL 50 MG PO TABS: 50.0000 mg | ORAL_TABLET | Freq: Three times a day (TID) | ORAL | Status: DC

## 2012-06-08 NOTE — Progress Notes (Signed)
Subjective:    Patient ID: Jenna [name], female    DOB: Sep 21, 1933, 78 y.o.   MRN: [id number]  HPI The patient complains about chronic low back pain which radiates into right leg intermittendly.  The problem has improved. MBB, has given her relief about 40% or more. Her shoulder pain is unchanged, it did not improve after the injection at her last visit.She reports that she will see Dr. Ranell Patrick for an evaluation of her left shoulder at the end of this month.  Pain Inventory Average Pain 3 Pain Right Now 6 My pain is sharp and aching  In the last 24 hours, has pain interfered with the following? General activity 2 Relation with others 0 Enjoyment of life 3 What TIME of day is your pain at its worst? day and night Sleep (in general) Good  Pain is worse with: standing and some activites Pain improves with: rest, medication and injections Relief from Meds: 7  Mobility walk without assistance how many minutes can you walk? 30-40 ability to climb steps?  yes do you drive?  yes Do you have any goals in this area?  yes  Function retired Do you have any goals in this area?  no  Neuro/Psych No problems in this area  Prior Studies Any changes since last visit?  no  Physicians involved in your care Any changes since last visit?  no   History reviewed. No pertinent family history. History   Social History  . Marital Status: Married    Spouse Name: N/A    Number of Children: N/A  . Years of Education: N/A   Social History Main Topics  . Smoking status: Former Games developer  . Smokeless tobacco: Former Neurosurgeon    Quit date: 01/14/1984  . Alcohol Use: 2.5 oz/week    5 drink(s) per week     occ wine w/ supper  . Drug Use: No  . Sexually Active: None   Other Topics Concern  . None   Social History Narrative  . None   History reviewed. No pertinent past surgical history. Past Medical History  Diagnosis Date  . Lumbosacral spondylosis without myelopathy   . Pain in  joint, upper arm   . Other chronic postoperative pain   . Spinal stenosis, lumbar region, without neurogenic claudication   . Osteoarthritis    BP 133/71  Pulse 67  Resp 14  Ht 5\' 2"  (1.575 m)  Wt 151 lb (68.493 kg)  BMI 27.62 kg/m2  SpO2 98%     Review of Systems  Musculoskeletal: Positive for myalgias, back pain and arthralgias.  All other systems reviewed and are negative.       Objective:   Physical Exam  Constitutional: She is oriented to person, place, and time. She appears well-developed and well-nourished.  HENT:  Head: Normocephalic.  Neck: Neck supple.  Musculoskeletal: She exhibits tenderness.  Neurological: She is alert and oriented to person, place, and time.  Skin: Skin is warm and dry.  Psychiatric: She has a normal mood and affect.    Symmetric normal motor tone is noted throughout. Normal muscle bulk. Muscle testing reveals 5/5 muscle strength of the upper extremity, and 5/5 of the lower extremity. Full range of motion in upper and lower extremities, except external rotation in abduction of the left shoulder, abduction 80, external rotation 20 degrees. ROM of spine is restricted. Fine motor movements are normal in both hands.  Sensory is intact and symmetric to light touch, pinprick and proprioception.  DTR  in the upper and lower extremity are present and symmetric 2+. No clonus is noted.  Patient arises from chair without difficulty. Narrow based gait with normal arm swing bilateral .        Assessment & Plan:  This is a 78 year old female with  1.Lumbar spondylosis without evidence of radiculopathy. S/p medial branch blocks, got 40% or more relief after the injections.  2. Chronic shoulder pain status post open rotator cuff surgery times 2 on the left and x1 on the right. She's also had a right bicipital tendon tear in the past. Not improved after left shoulder injection at last visit. Showed and practice exercises for her shoulders and neck at this  visit.  3. Left sided neck pain, with increased muscle tone in her left trapezius Plan :  Patient should do those exercises we practice at home, she also showed try to continue with her walking program. Prescribed Robaxin, patient states, that she is a little hesitant to take flexeril with the demerol, it makes her a little drowsy. Educated patient, that she should not take the robaxin and the flexeril.She should d/c the flexeril while trying the Robaxin. We discussed that she should get new walking shoes, and also might consider getting a new mattress.  Follow up in in one month.

## 2012-06-08 NOTE — Patient Instructions (Signed)
Continue with exercising , continue with walking on your treadmill.

## 2012-07-07 ENCOUNTER — Encounter: Payer: Self-pay | Admitting: Physical Medicine and Rehabilitation

## 2012-07-07 ENCOUNTER — Encounter
Payer: Medicare Other | Attending: Physical Medicine and Rehabilitation | Admitting: Physical Medicine and Rehabilitation

## 2012-07-07 VITALS — BP 135/58 | HR 68 | Resp 16 | Ht 62.0 in | Wt 149.0 lb

## 2012-07-07 DIAGNOSIS — M47812 Spondylosis without myelopathy or radiculopathy, cervical region: Secondary | ICD-10-CM

## 2012-07-07 DIAGNOSIS — M25519 Pain in unspecified shoulder: Secondary | ICD-10-CM | POA: Insufficient documentation

## 2012-07-07 DIAGNOSIS — M47817 Spondylosis without myelopathy or radiculopathy, lumbosacral region: Secondary | ICD-10-CM | POA: Insufficient documentation

## 2012-07-07 DIAGNOSIS — G8929 Other chronic pain: Secondary | ICD-10-CM | POA: Insufficient documentation

## 2012-07-07 DIAGNOSIS — M542 Cervicalgia: Secondary | ICD-10-CM | POA: Insufficient documentation

## 2012-07-07 MED ORDER — MEPERIDINE HCL 50 MG PO TABS: 50.0000 mg | ORAL_TABLET | Freq: Three times a day (TID) | ORAL | Status: DC

## 2012-07-07 NOTE — Progress Notes (Signed)
Subjective:    Patient ID: Jenna [name], female    DOB: 1932/11/21, 78 y.o.   MRN: [id number]  HPI The patient complains about chronic low back pain which radiates into right leg intermittendly.  The problem has improved. MBB, has given her relief about 40% or more. Her shoulder pain is unchanged, it did not improve after the injection at her last visit.She reports that she saw Dr. Ranell Patrick, he x-rayed her cervical spine which showed spondylitic changes.He is considering ESI into her lower C-spine.  Pain Inventory Average Pain 3 Pain Right Now 2 My pain is intermittent and aching  In the last 24 hours, has pain interfered with the following? General activity 2 Relation with others 0 Enjoyment of life 3 What TIME of day is your pain at its worst? daytime Sleep (in general) Good  Pain is worse with: standing and some activites Pain improves with: rest, medication and injections Relief from Meds: 8  Mobility walk without assistance ability to climb steps?  yes do you drive?  yes  Function Do you have any goals in this area?  no  Neuro/Psych No problems in this area  Prior Studies Any changes since last visit?  no  Physicians involved in your care Any changes since last visit?  no   History reviewed. No pertinent family history. History   Social History  . Marital Status: Married    Spouse Name: N/A    Number of Children: N/A  . Years of Education: N/A   Social History Main Topics  . Smoking status: Former Games developer  . Smokeless tobacco: Former Neurosurgeon    Quit date: 01/14/1984  . Alcohol Use: 2.5 oz/week    5 drink(s) per week     occ wine w/ supper  . Drug Use: No  . Sexually Active: None   Other Topics Concern  . None   Social History Narrative  . None   History reviewed. No pertinent past surgical history. Past Medical History  Diagnosis Date  . Lumbosacral spondylosis without myelopathy   . Pain in joint, upper arm   . Other chronic postoperative  pain   . Spinal stenosis, lumbar region, without neurogenic claudication   . Osteoarthritis    BP 135/58  Pulse 68  Resp 16  Ht 5\' 2"  (1.575 m)  Wt 149 lb (67.586 kg)  BMI 27.25 kg/m2  SpO2 98%      Review of Systems  Constitutional: Negative.   HENT: Positive for neck stiffness.   Eyes: Negative.   Respiratory: Negative.   Cardiovascular: Negative.   Gastrointestinal: Negative.   Genitourinary: Negative.   Musculoskeletal: Positive for back pain.  Skin: Negative.   Neurological: Negative.   Hematological: Negative.   Psychiatric/Behavioral: Negative.        Objective:   Physical Exam Constitutional: She is oriented to person, place, and time. She appears well-developed and well-nourished.  HENT:  Head: Normocephalic.  Neck: Neck supple.  Musculoskeletal: She exhibits tenderness.  Neurological: She is alert and oriented to person, place, and time.  Skin: Skin is warm and dry.  Psychiatric: She has a normal mood and affect.   Symmetric normal motor tone is noted throughout. Normal muscle bulk. Muscle testing reveals 5/5 muscle strength of the upper extremity, and 5/5 of the lower extremity. Full range of motion in upper and lower extremities, except external rotation in abduction of the left shoulder, abduction 80, external rotation 20 degrees. ROM of spine is restricted. Fine motor movements are normal  in both hands.  Sensory is intact and symmetric to light touch, pinprick and proprioception.  DTR in the upper and lower extremity are present and symmetric 2+. No clonus is noted.  Patient arises from chair without difficulty. Narrow based gait with normal arm swing bilateral .        Assessment & Plan:  This is a 78 year old female with  1.Lumbar spondylosis without evidence of radiculopathy. S/p medial branch blocks, got 40% or more relief after the injections.  2. Chronic shoulder pain status post open rotator cuff surgery times 2 on the left and x1 on the  right. She's also had a right bicipital tendon tear in the past. Not improved after left shoulder injection at last visit. Showed and practice exercises for her shoulders and neck at this visit.  3. Left sided neck pain, X-ray showed spondylosis of C-spine, with increased muscle tone in her left trapezius , Dr. Ranell Patrick is considering epidural injections into lower C-spine. Plan :  Patient should do those exercises we practice at home, she also should try to continue with her walking program. Showed patients some exercises to improve her posture. Consider PT with modalities for her neck and shoulder at next visit. Follow up in in one month.

## 2012-07-07 NOTE — Patient Instructions (Signed)
Try applying heat to your neck, keep a good posture.

## 2012-07-28 ENCOUNTER — Other Ambulatory Visit: Payer: Self-pay | Admitting: Dermatology

## 2012-08-03 ENCOUNTER — Encounter: Payer: Self-pay | Admitting: Physical Medicine and Rehabilitation

## 2012-08-03 ENCOUNTER — Encounter
Payer: Medicare Other | Attending: Physical Medicine and Rehabilitation | Admitting: Physical Medicine and Rehabilitation

## 2012-08-03 VITALS — BP 137/67 | HR 71 | Resp 14 | Ht 62.0 in | Wt 152.0 lb

## 2012-08-03 DIAGNOSIS — M47812 Spondylosis without myelopathy or radiculopathy, cervical region: Secondary | ICD-10-CM | POA: Insufficient documentation

## 2012-08-03 DIAGNOSIS — M47817 Spondylosis without myelopathy or radiculopathy, lumbosacral region: Secondary | ICD-10-CM | POA: Insufficient documentation

## 2012-08-03 DIAGNOSIS — M542 Cervicalgia: Secondary | ICD-10-CM | POA: Insufficient documentation

## 2012-08-03 DIAGNOSIS — M79609 Pain in unspecified limb: Secondary | ICD-10-CM | POA: Insufficient documentation

## 2012-08-03 DIAGNOSIS — G8929 Other chronic pain: Secondary | ICD-10-CM | POA: Insufficient documentation

## 2012-08-03 DIAGNOSIS — M47816 Spondylosis without myelopathy or radiculopathy, lumbar region: Secondary | ICD-10-CM

## 2012-08-03 DIAGNOSIS — M545 Low back pain, unspecified: Secondary | ICD-10-CM | POA: Insufficient documentation

## 2012-08-03 DIAGNOSIS — M25519 Pain in unspecified shoulder: Secondary | ICD-10-CM | POA: Insufficient documentation

## 2012-08-03 MED ORDER — MEPERIDINE HCL 50 MG PO TABS: 50.0000 mg | ORAL_TABLET | Freq: Three times a day (TID) | ORAL | Status: DC

## 2012-08-03 NOTE — Patient Instructions (Signed)
Continue with correcting your posture , continue with your walking program.

## 2012-08-03 NOTE — Progress Notes (Signed)
Subjective:    Patient ID: Jenna [name], female    DOB: [birth year], 78 y.o.   MRN: [id number]  HPI The patient complains about chronic low back pain which radiates into right leg intermittendly.  The problem has improved. MBB, has given her relief about 40% or more. Her shoulder pain is unchanged, it did not improve after the injection at her last visit.She reports that she saw Dr. Ranell Patrick, he x-rayed her cervical spine which showed spondylitic changes.He ordered an ESI into her lower C-spine, which will be done by Dr. Marvia Pickles tomorrow.  Pain Inventory Average Pain 5 Pain Right Now 5 My pain is sharp and aching  In the last 24 hours, has pain interfered with the following? General activity 4 Relation with others 0 Enjoyment of life 3 What TIME of day is your pain at its worst? morning and evening Sleep (in general) Good  Pain is worse with: walking, standing and some activites Pain improves with: rest, medication and injections Relief from Meds: 6  Mobility walk without assistance how many minutes can you walk? 20-25 ability to climb steps?  yes do you drive?  yes Do you have any goals in this area?  yes  Function retired Do you have any goals in this area?  no  Neuro/Psych No problems in this area  Prior Studies x-rays CT/MRI  Physicians involved in your care Any changes since last visit?  no   History reviewed. No pertinent family history. History   Social History  . Marital Status: Married    Spouse Name: N/A    Number of Children: N/A  . Years of Education: N/A   Social History Main Topics  . Smoking status: Former Games developer  . Smokeless tobacco: Former Neurosurgeon    Quit date: 01/14/1984  . Alcohol Use: 2.5 oz/week    5 drink(s) per week     occ wine w/ supper  . Drug Use: No  . Sexually Active: None   Other Topics Concern  . None   Social History Narrative  . None   History reviewed. No pertinent past surgical history. Past Medical History    Diagnosis Date  . Lumbosacral spondylosis without myelopathy   . Pain in joint, upper arm   . Other chronic postoperative pain   . Spinal stenosis, lumbar region, without neurogenic claudication   . Osteoarthritis    BP 137/67  Pulse 71  Resp 14  Ht 5\' 2"  (1.575 m)  Wt 152 lb (68.947 kg)  BMI 27.80 kg/m2  SpO2 100%     Review of Systems  HENT: Positive for neck pain.   Musculoskeletal: Positive for myalgias, back pain and arthralgias.       Objective:   Physical Exam Constitutional: She is oriented to person, place, and time. She appears well-developed and well-nourished.  HENT:  Head: Normocephalic.  Neck: Neck supple.  Musculoskeletal: She exhibits tenderness.  Neurological: She is alert and oriented to person, place, and time.  Skin: Skin is warm and dry.  Psychiatric: She has a normal mood and affect.  Symmetric normal motor tone is noted throughout. Normal muscle bulk. Muscle testing reveals 5/5 muscle strength of the upper extremity, and 5/5 of the lower extremity. Full range of motion in upper and lower extremities, except external rotation in abduction of the left shoulder, abduction 80, external rotation 20 degrees. ROM of spine is restricted. Fine motor movements are normal in both hands.  Sensory is intact and symmetric to light touch, pinprick and  proprioception.  DTR in the upper and lower extremity are present and symmetric 2+. No clonus is noted.  Patient arises from chair without difficulty. Narrow based gait with normal arm swing bilateral .        Assessment & Plan:  This is a 78 year old female with  1.Lumbar spondylosis without evidence of radiculopathy. S/p medial branch blocks, got 40% or more relief after the injections.  2. Chronic shoulder pain status post open rotator cuff surgery times 2 on the left and x1 on the right. She's also had a right bicipital tendon tear in the past. Not improved after left shoulder injection at last visit. Showed  and practice exercises for her shoulders and neck at this visit.  3. Left sided neck pain, X-ray showed spondylosis of C-spine, with increased muscle tone in her left trapezius , Dr. Ranell Patrick is considering epidural injections into lower C-spine, she will have an ESI in her C-spine tomorrow.  Plan :  Patient should do those exercises we practice at home, she also should try to continue with her walking program. Showed patients some exercises to improve her posture. Consider PT with modalities for her neck and shoulder at next visit, after injection.  Follow up in in one month.

## 2012-08-04 NOTE — Addendum Note (Signed)
Addended by: Judd Gaudier on: 08/04/2012 08:42 AM   Modules accepted: Orders

## 2012-08-10 ENCOUNTER — Telehealth: Payer: Self-pay | Admitting: *Deleted

## 2012-08-10 NOTE — Telephone Encounter (Signed)
Pt aware that she shouldn't mix ETOH and narcotics. She admits to drinking the night before her appointment.

## 2012-08-10 NOTE — Telephone Encounter (Signed)
Inconsistent UDS. Positive for ETOH, pt needs counseling on drinking alcohol while on narcotics.  LM for pt to call back.

## 2012-08-31 ENCOUNTER — Encounter
Payer: Medicare Other | Attending: Physical Medicine and Rehabilitation | Admitting: Physical Medicine and Rehabilitation

## 2012-08-31 ENCOUNTER — Encounter: Payer: Self-pay | Admitting: Physical Medicine and Rehabilitation

## 2012-08-31 VITALS — BP 140/62 | HR 71 | Resp 18 | Ht 62.0 in | Wt 151.0 lb

## 2012-08-31 DIAGNOSIS — M47812 Spondylosis without myelopathy or radiculopathy, cervical region: Secondary | ICD-10-CM

## 2012-08-31 DIAGNOSIS — M25519 Pain in unspecified shoulder: Secondary | ICD-10-CM

## 2012-08-31 DIAGNOSIS — M47817 Spondylosis without myelopathy or radiculopathy, lumbosacral region: Secondary | ICD-10-CM

## 2012-08-31 DIAGNOSIS — M47816 Spondylosis without myelopathy or radiculopathy, lumbar region: Secondary | ICD-10-CM

## 2012-08-31 DIAGNOSIS — M542 Cervicalgia: Secondary | ICD-10-CM | POA: Insufficient documentation

## 2012-08-31 DIAGNOSIS — M545 Low back pain, unspecified: Secondary | ICD-10-CM | POA: Insufficient documentation

## 2012-08-31 DIAGNOSIS — G8929 Other chronic pain: Secondary | ICD-10-CM | POA: Insufficient documentation

## 2012-08-31 DIAGNOSIS — M79609 Pain in unspecified limb: Secondary | ICD-10-CM | POA: Insufficient documentation

## 2012-08-31 MED ORDER — MEPERIDINE HCL 50 MG PO TABS: 50.0000 mg | ORAL_TABLET | Freq: Three times a day (TID) | ORAL | Status: DC

## 2012-08-31 NOTE — Patient Instructions (Signed)
Try to be as active as tolerated, continue with keeping a good posture and continue with your exercises.

## 2012-08-31 NOTE — Progress Notes (Signed)
HPI The patient complains about chronic low back pain which radiates into right leg intermittendly.  The problem has improved. MBB, has given her relief about 40% or more. Her shoulder pain is unchanged, it did not improve after the injection at her last visit.She reports that she saw Dr. Ranell Patrick, he x-rayed her cervical spine which showed spondylitic changes.He ordered an ESI into her lower C-spine,C7-T1 which was done by Dr. Marvia Pickles and gave her about 50% relief. She will have a repeat injection in 2 weeks.    Subjective:  Cervical ESI by Dr Ethelene Hal helpful to neck and shoulder pain Pain Inventory Average Pain 3 Pain Right Now 2 My pain is sharp and aching  In the last 24 hours, has pain interfered with the following? General activity 2 Relation with others 0 Enjoyment of life 2 What TIME of day is your pain at its worst? daytime and evening Sleep (in general) Good  Pain is worse with: standing and some activites Pain improves with: rest, medication and injections Relief from Meds: 8  Mobility walk without assistance how many minutes can you walk? 30 ability to climb steps?  yes do you drive?  yes Do you have any goals in this area?  yes  Function retired Do you have any goals in this area?  no  Neuro/Psych No problems in this area  Prior Studies Any changes since last visit?  no  Physicians involved in your care Any changes since last visit?  no   No family history on file. History   Social History  . Marital Status: Married    Spouse Name: N/A    Number of Children: N/A  . Years of Education: N/A   Social History Main Topics  . Smoking status: Former Games developer  . Smokeless tobacco: Former Neurosurgeon    Quit date: 01/14/1984  . Alcohol Use: 2.5 oz/week    5 drink(s) per week     occ wine w/ supper  . Drug Use: No  . Sexually Active: None   Other Topics Concern  . None   Social History Narrative  . None   History reviewed. No pertinent past surgical  history. Past Medical History  Diagnosis Date  . Lumbosacral spondylosis without myelopathy   . Pain in joint, upper arm   . Other chronic postoperative pain   . Spinal stenosis, lumbar region, without neurogenic claudication   . Osteoarthritis    BP 140/62  Pulse 71  Resp 18  Ht 5\' 2"  (1.575 m)  Wt 151 lb (68.493 kg)  BMI 27.62 kg/m2  SpO2 98%     Patient ID: Jenna [name], female    DOB: 1932/11/23, 78 y.o.   MRN: [id number]  HPI    Review of Systems  Constitutional: Negative.   HENT: Negative.   Eyes: Negative.   Respiratory: Negative.   Cardiovascular: Negative.   Gastrointestinal: Negative.   Genitourinary: Negative.   Musculoskeletal: Positive for myalgias and back pain.  Skin: Negative.   Neurological: Negative.   Hematological: Negative.   Psychiatric/Behavioral: Negative.        Objective:   Physical Exam Constitutional: She is oriented to person, place, and time. She appears well-developed and well-nourished.  HENT:  Head: Normocephalic.  Neck: Neck supple.  Musculoskeletal: She exhibits tenderness.  Neurological: She is alert and oriented to person, place, and time.  Skin: Skin is warm and dry.  Psychiatric: She has a normal mood and affect.  Symmetric normal motor tone is noted throughout. Normal  muscle bulk. Muscle testing reveals 5/5 muscle strength of the upper extremity, and 5/5 of the lower extremity. Full range of motion in upper and lower extremities, except external rotation in abduction of the left shoulder, abduction 80, external rotation 20 degrees. ROM of spine is restricted. Fine motor movements are normal in both hands.  Sensory is intact and symmetric to light touch, pinprick and proprioception.  DTR in the upper and lower extremity are present and symmetric 2+. No clonus is noted.  Patient arises from chair without difficulty. Narrow based gait with normal arm swing bilateral .        Assessment & Plan:  This is a 78 year old  female with  1.Lumbar spondylosis without evidence of radiculopathy. S/p medial branch blocks, got 40% or more relief after the injections.  2. Chronic shoulder pain status post open rotator cuff surgery times 2 on the left and x1 on the right. She's also had a right bicipital tendon tear in the past. Not improved after left shoulder injection at last visit. Showed and practice exercises for her shoulders and neck at this visit.  3. Left sided neck pain, X-ray showed spondylosis of C-spine, with increased muscle tone in her left trapezius , epidural injection into lower C-spine,C7-T1, with 50% relief, she will have a repeat ESI in her C-spine in 2 weeks.   Plan :  Patient should do those exercises we practiced, at home, she also should try to continue with her walking program. Showed patients some exercises to improve her posture. Consider PT with modalities for her neck and shoulder at next visit, after next  injection.  Follow up in in one month.

## 2012-09-28 ENCOUNTER — Encounter: Payer: Self-pay | Admitting: Physical Medicine and Rehabilitation

## 2012-09-28 ENCOUNTER — Encounter
Payer: Medicare Other | Attending: Physical Medicine and Rehabilitation | Admitting: Physical Medicine and Rehabilitation

## 2012-09-28 VITALS — BP 149/74 | HR 72 | Resp 14 | Ht 62.0 in | Wt 152.4 lb

## 2012-09-28 DIAGNOSIS — M545 Low back pain, unspecified: Secondary | ICD-10-CM | POA: Insufficient documentation

## 2012-09-28 DIAGNOSIS — M19019 Primary osteoarthritis, unspecified shoulder: Secondary | ICD-10-CM

## 2012-09-28 DIAGNOSIS — M47812 Spondylosis without myelopathy or radiculopathy, cervical region: Secondary | ICD-10-CM

## 2012-09-28 DIAGNOSIS — M12819 Other specific arthropathies, not elsewhere classified, unspecified shoulder: Secondary | ICD-10-CM

## 2012-09-28 DIAGNOSIS — M47817 Spondylosis without myelopathy or radiculopathy, lumbosacral region: Secondary | ICD-10-CM

## 2012-09-28 DIAGNOSIS — M25519 Pain in unspecified shoulder: Secondary | ICD-10-CM | POA: Insufficient documentation

## 2012-09-28 DIAGNOSIS — M47816 Spondylosis without myelopathy or radiculopathy, lumbar region: Secondary | ICD-10-CM

## 2012-09-28 DIAGNOSIS — M79609 Pain in unspecified limb: Secondary | ICD-10-CM | POA: Insufficient documentation

## 2012-09-28 DIAGNOSIS — M542 Cervicalgia: Secondary | ICD-10-CM | POA: Insufficient documentation

## 2012-09-28 DIAGNOSIS — G8929 Other chronic pain: Secondary | ICD-10-CM | POA: Insufficient documentation

## 2012-09-28 MED ORDER — MEPERIDINE HCL 50 MG PO TABS: 50.0000 mg | ORAL_TABLET | Freq: Three times a day (TID) | ORAL | Status: DC

## 2012-09-28 NOTE — Progress Notes (Signed)
Subjective:    Patient ID: Jenna [name], female    DOB: 10-12-1933, 78 y.o.   MRN: [id number]  HPI The patient complains about chronic low back pain which radiates into right leg intermittendly.  The problem has improved. MBB, has given her relief about 40% or more. Her shoulder pain is unchanged, it did not improve after the injection at her last visit.She reports that she saw Dr. Ranell Patrick, he x-rayed her cervical spine which showed spondylitic changes.He ordered an ESI into her lower C-spine,C7-T1 which was done by Dr. Marvia Pickles and gave her about 50% relief. She had a repeat injection 2 weeks ago, which did not have the same positive effect as the first one.   Pain Inventory Average Pain 4 Pain Right Now 4 My pain is intermittent, sharp and aching  In the last 24 hours, has pain interfered with the following? General activity 3 Relation with others 0 Enjoyment of life 2 What TIME of day is your pain at its worst? daytime and evening Sleep (in general) Good  Pain is worse with: walking, standing and some activites Pain improves with: rest, medication and injections Relief from Meds: 7  Mobility walk without assistance how many minutes can you walk? 30 ability to climb steps?  yes do you drive?  yes Do you have any goals in this area?  yes  Function retired Do you have any goals in this area?  no  Neuro/Psych No problems in this area  Prior Studies Any changes since last visit?  no  Physicians involved in your care Any changes since last visit?  no   History reviewed. No pertinent family history. History   Social History  . Marital Status: Married    Spouse Name: N/A    Number of Children: N/A  . Years of Education: N/A   Social History Main Topics  . Smoking status: Former Games developer  . Smokeless tobacco: Former Neurosurgeon    Quit date: 01/14/1984  . Alcohol Use: 2.5 oz/week    5 drink(s) per week     Comment: occ wine w/ supper  . Drug Use: No  . Sexually Active:  None   Other Topics Concern  . None   Social History Narrative  . None   History reviewed. No pertinent past surgical history. Past Medical History  Diagnosis Date  . Lumbosacral spondylosis without myelopathy   . Pain in joint, upper arm   . Other chronic postoperative pain   . Spinal stenosis, lumbar region, without neurogenic claudication   . Osteoarthritis    BP 149/74  Pulse 72  Resp 14  Ht 5\' 2"  (1.575 m)  Wt 152 lb 6.4 oz (69.128 kg)  BMI 27.87 kg/m2  SpO2 99%    Review of Systems  HENT: Positive for neck pain.   Musculoskeletal: Positive for back pain.  All other systems reviewed and are negative.       Objective:   Physical Exam Constitutional: She is oriented to person, place, and time. She appears well-developed and well-nourished.  HENT:  Head: Normocephalic.  Neck: Neck supple.  Musculoskeletal: She exhibits tenderness.  Neurological: She is alert and oriented to person, place, and time.  Skin: Skin is warm and dry.  Psychiatric: She has a normal mood and affect.  Symmetric normal motor tone is noted throughout. Normal muscle bulk. Muscle testing reveals 5/5 muscle strength of the upper extremity, and 5/5 of the lower extremity. Full range of motion in upper and lower extremities, except external  rotation in abduction of the left shoulder, abduction 80, external rotation 20 degrees. ROM of spine is restricted. Fine motor movements are normal in both hands.  Sensory is intact and symmetric to light touch, pinprick and proprioception.  DTR in the upper and lower extremity are present and symmetric 2+. No clonus is noted.  Patient arises from chair without difficulty. Narrow based gait with normal arm swing bilateral .        Assessment & Plan:  This is a 78 year old female with  1.Lumbar spondylosis without evidence of radiculopathy. S/p medial branch blocks, got 40% or more relief after the injections.  2. Chronic shoulder pain status post open  rotator cuff surgery times 2 on the left and x1 on the right. She's also had a right bicipital tendon tear in the past. Not improved after left shoulder injection at last visit. Showed and practice exercises for her shoulders and neck at this visit.  3. Left sided neck pain, X-ray showed spondylosis of C-spine, with increased muscle tone in her left trapezius , epidural injection into lower C-spine,C7-T1, with 50% relief, she will have a repeat ESI in her C-spine in 2 weeks.  Plan :  Patient should do those exercises we practiced, at home, she also should try to continue with her walking program. Showed patients some exercises to improve her posture. Consider PT with modalities for her neck and shoulder at next visit, after next injection. Educated patient about interaction of alcohol and narcotics, and that she should not drink alcohol, when she is taking narcotics. Follow up in in 6 weeks.

## 2012-09-28 NOTE — Patient Instructions (Signed)
Continue with staying as active as tolerated. You could try Arnica cream for your muscle and joint pain.

## 2012-11-08 ENCOUNTER — Encounter: Payer: Self-pay | Admitting: Physical Medicine and Rehabilitation

## 2012-11-08 ENCOUNTER — Encounter
Payer: Medicare Other | Attending: Physical Medicine and Rehabilitation | Admitting: Physical Medicine and Rehabilitation

## 2012-11-08 VITALS — BP 127/71 | HR 70 | Resp 14 | Ht 62.0 in | Wt 152.0 lb

## 2012-11-08 DIAGNOSIS — M25519 Pain in unspecified shoulder: Secondary | ICD-10-CM | POA: Insufficient documentation

## 2012-11-08 DIAGNOSIS — G8929 Other chronic pain: Secondary | ICD-10-CM | POA: Insufficient documentation

## 2012-11-08 DIAGNOSIS — M47816 Spondylosis without myelopathy or radiculopathy, lumbar region: Secondary | ICD-10-CM

## 2012-11-08 DIAGNOSIS — M47817 Spondylosis without myelopathy or radiculopathy, lumbosacral region: Secondary | ICD-10-CM

## 2012-11-08 DIAGNOSIS — M47812 Spondylosis without myelopathy or radiculopathy, cervical region: Secondary | ICD-10-CM

## 2012-11-08 MED ORDER — MEPERIDINE HCL 50 MG PO TABS: 50.0000 mg | ORAL_TABLET | Freq: Three times a day (TID) | ORAL | Status: DC

## 2012-11-08 NOTE — Progress Notes (Signed)
Subjective:    Patient ID: Jenna [name], female    DOB: 1933/05/23, 79 y.o.   MRN: [id number]  HPI The patient complains about chronic low back pain which radiates into right leg intermittendly.  The problem has improved. MBB, has given her relief about 40% or more. Although she complains about mild low back pain after bending forward to pick something up. Her shoulder pain is unchanged, it did not improve after the injection at her last visit.She reports that she saw Dr. Ranell Patrick, he x-rayed her cervical spine which showed spondylitic changes.He ordered an ESI into her lower C-spine,C7-T1 which was done by Dr. Marvia Pickles and gave her about 50% relief. She had a repeat injection 2 weeks ago, which did not have the same positive effect as the first one.   Pain Inventory Average Pain 6 Pain Right Now 6 My pain is sharp and aching  In the last 24 hours, has pain interfered with the following? General activity 7 Relation with others 2 Enjoyment of life 7 What TIME of day is your pain at its worst? day and evening Sleep (in general) Good  Pain is worse with: bending, standing, some activites and getting up and down Pain improves with: rest and medication Relief from Meds: 5  Mobility walk without assistance how many minutes can you walk? 20-30 Do you have any goals in this area?  yes  Function retired Do you have any goals in this area?  no  Neuro/Psych No problems in this area  Prior Studies Any changes since last visit?  no  Physicians involved in your care Any changes since last visit?  no   History reviewed. No pertinent family history. History   Social History  . Marital Status: Married    Spouse Name: N/A    Number of Children: N/A  . Years of Education: N/A   Social History Main Topics  . Smoking status: Former Games developer  . Smokeless tobacco: Former Neurosurgeon    Quit date: 01/14/1984  . Alcohol Use: 2.5 oz/week    5 drink(s) per week     Comment: occ wine w/ supper    . Drug Use: No  . Sexually Active: None   Other Topics Concern  . None   Social History Narrative  . None   History reviewed. No pertinent past surgical history. Past Medical History  Diagnosis Date  . Lumbosacral spondylosis without myelopathy   . Pain in joint, upper arm   . Other chronic postoperative pain   . Spinal stenosis, lumbar region, without neurogenic claudication   . Osteoarthritis    BP 127/71  Pulse 70  Resp 14  Ht 5\' 2"  (1.575 m)  Wt 152 lb (68.947 kg)  BMI 27.80 kg/m2  SpO2 99%     Review of Systems  HENT: Positive for neck pain.   Musculoskeletal: Positive for back pain.  All other systems reviewed and are negative.       Objective:   Physical Exam Constitutional: She is oriented to person, place, and time. She appears well-developed and well-nourished.  HENT:  Head: Normocephalic.  Neck: Neck supple.  Musculoskeletal: She exhibits tenderness.  Neurological: She is alert and oriented to person, place, and time.  Skin: Skin is warm and dry.  Psychiatric: She has a normal mood and affect.  Symmetric normal motor tone is noted throughout. Normal muscle bulk. Muscle testing reveals 5/5 muscle strength of the upper extremity, and 5/5 of the lower extremity. Full range of motion in  upper and lower extremities, except external rotation in abduction of the left shoulder, abduction 80, external rotation 20 degrees. ROM of spine is restricted. Fine motor movements are normal in both hands.  Sensory is intact and symmetric to light touch, pinprick and proprioception.  DTR in the upper and lower extremity are present and symmetric 2+. No clonus is noted.  Patient arises from chair without difficulty. Narrow based gait with normal arm swing bilateral .        Assessment & Plan:  This is a 79 year old female with  1.Lumbar spondylosis without evidence of radiculopathy. S/p medial branch blocks, got 40% or more relief after the injections. Mild  exacerbation after bending forward to pick up something, might consider repeat MBB if it gets worse. 2. Chronic shoulder pain status post open rotator cuff surgery times 2 on the left and x1 on the right. She's also had a right bicipital tendon tear in the past. Not improved after left shoulder injection at last visit. Showed and practice exercises for her shoulders and neck at this visit.  3. Left sided neck pain, X-ray showed spondylosis of C-spine, with increased muscle tone in her left trapezius , epidural injection into lower C-spine,C7-T1, with 50% relief, had one repeat ESI in her C-spine, which helped also, but not quite as much as the first injection.  Plan :  Patient should do those exercises we practiced, at home, she also should try to continue with her walking program. Showed patients some exercises to improve her posture. Consider PT with modalities for her neck and shoulder at next visit, after next injection. Educated patient about interaction of alcohol and narcotics, and that she should not drink alcohol, when she is taking narcotics.  Follow up in in 6 weeks.

## 2012-11-08 NOTE — Patient Instructions (Signed)
Try to stay as active as tolerated, start slowly to walk on your treadmill, progress as pain permits.

## 2012-11-15 ENCOUNTER — Telehealth: Payer: Self-pay

## 2012-11-15 NOTE — Telephone Encounter (Signed)
Yes , she had MBB in the past, we considered this at the last visit, please let her know that she should not have more than 6 injections per year

## 2012-11-15 NOTE — Telephone Encounter (Signed)
Patient is calling requesting a back injection. Is this ok to schedule?  Please advise.

## 2012-12-09 ENCOUNTER — Ambulatory Visit (HOSPITAL_BASED_OUTPATIENT_CLINIC_OR_DEPARTMENT_OTHER): Payer: Medicare Other | Admitting: Physical Medicine & Rehabilitation

## 2012-12-09 ENCOUNTER — Encounter: Payer: Medicare Other | Attending: Physical Medicine and Rehabilitation

## 2012-12-09 ENCOUNTER — Encounter: Payer: Self-pay | Admitting: Physical Medicine & Rehabilitation

## 2012-12-09 VITALS — BP 131/67 | HR 69 | Resp 14 | Ht 62.0 in | Wt 153.0 lb

## 2012-12-09 DIAGNOSIS — G8929 Other chronic pain: Secondary | ICD-10-CM | POA: Insufficient documentation

## 2012-12-09 DIAGNOSIS — M25519 Pain in unspecified shoulder: Secondary | ICD-10-CM | POA: Insufficient documentation

## 2012-12-09 DIAGNOSIS — M47816 Spondylosis without myelopathy or radiculopathy, lumbar region: Secondary | ICD-10-CM

## 2012-12-09 DIAGNOSIS — M47817 Spondylosis without myelopathy or radiculopathy, lumbosacral region: Secondary | ICD-10-CM | POA: Insufficient documentation

## 2012-12-09 DIAGNOSIS — M79609 Pain in unspecified limb: Secondary | ICD-10-CM | POA: Insufficient documentation

## 2012-12-09 DIAGNOSIS — M545 Low back pain, unspecified: Secondary | ICD-10-CM | POA: Insufficient documentation

## 2012-12-09 DIAGNOSIS — M542 Cervicalgia: Secondary | ICD-10-CM | POA: Insufficient documentation

## 2012-12-09 MED ORDER — MEPERIDINE HCL 50 MG PO TABS
50.0000 mg | ORAL_TABLET | Freq: Three times a day (TID) | ORAL | Status: DC
Start: 2012-12-09 — End: 2013-01-10

## 2012-12-09 NOTE — Progress Notes (Signed)
  PROCEDURE RECORD [facility]   Name: Jenna [name] DOB:1932-11-12 MRN: [id number]  Date:12/09/2012  Physician: Claudette Laws, MD    Nurse/CMA: Kelli Churn, CMA  Allergies:  Allergies  Allergen Reactions  . Codeine Shortness Of Breath  . Darvon Shortness Of Breath  . Tramadol Shortness Of Breath  . Tylenol (Acetaminophen) Shortness Of Breath  . Vicodin (Hydrocodone-Acetaminophen) Shortness Of Breath  . Dalmane (Flurazepam Hcl) Other (See Comments)    confusion  . Cephalexin Nausea Only  . Clarithromycin Nausea Only  . Doxycycline Calcium Rash    Consent Signed: yes  Is patient diabetic? no    Pregnant: no LMP: No LMP recorded. Patient has had a hysterectomy. (age 4-55)  Anticoagulants: no Anti-inflammatory: no Antibiotics: no  Procedure: medial branch block  Position: Prone Start Time:  1234 End Time:  1250 Fluoro Time: 44  RN/CMA Levens,CMA Levens, CMA    Time 1221 1254    BP 131/67 145/64    Pulse 69 72    Respirations 14 14    O2 Sat 98 98    S/S 6 6    Pain Level 3/10 3/10     D/C home with Bill-husband, [name], D/C instructions reviewed, and sits independently.

## 2012-12-09 NOTE — Progress Notes (Signed)
Bilateral back pain right greater than left. Some radiation toward the hip. Last medial branch blocks were very helpful on the right side performed in May of 2013. Pain has been increasing once again and now affecting left side as well  Bilateral Lumbar L3, L4  medial branch blocks and L 5 dorsal ramus injection under fluoroscopic guidance  Indication: Lumbar pain which is not relieved by medication management or other conservative care and interfering with self-care and mobility.  Informed consent was obtained after describing risks and benefits of the procedure with the patient, this includes bleeding, infection, paralysis and medication side effects.  The patient wishes to proceed and has given written consent.  The patient was placed in prone position.  The lumbar area was marked and prepped with Betadine.  One mL of 1% lidocaine was injected into each of 6 areas into the skin and subcutaneous tissue.  Then a 22-gauge 3.5 spinal needle was inserted targeting the junction of the left S1 superior articular process and sacral ala junction. Needle was advanced under fluoroscopic guidance.  Bone contact was made.  Omnipaque 180 was injected x 0.5 mL demonstrating no intravascular uptake.  Then a solution containing one mL of 4 mg per mL dexamethasone and 3 mL of 2% MPF lidocaine was injected x 0.5 mL.  Then the left L5 superior articular process in transverse process junction was targeted.  Bone contact was made.  Omnipaque 180 was injected x 0.5 mL demonstrating no intravascular uptake. Then a solution containing one mL of 4 mg per mL dexamethasone and 3 mL of 2% MPF lidocaine was injected x 0.5 mL.  Then the left L4 superior articular process in transverse process junction was targeted.  Bone contact was made.  Omnipaque 180 was injected x 0.5 mL demonstrating no intravascular uptake.  Then a solution containing one mL of 4 mg per mL dexamethasone and 3 mL if 2% MPF lidocaine was injected x 0.5 mL.  This same  procedure was performed on the right side using the same needle, technique and injectate.  Patient tolerated procedure well.  Post procedure instructions were given.

## 2012-12-09 NOTE — Patient Instructions (Addendum)
Facet Block:Last injections done May 2013, L3,4,5, Today we did Bilateral L3-4-5 A facet block is an injection procedure used to numb nerves near a spinal joint (facet). The injection usually includes a medicine like Novacaine (anesthetic) and a steroid medicine (similar to cortisone). The injections are made directly into the facet joint of the back. They are used for patients with several types of neck or back pain problems (such as worsening arthritis or persistent pain after surgery) that have not been helped with anti-inflammatory medications, exercise programs, physical therapy, and other forms of pain management. Multiple injections may be needed depending on how many joints are involved.  A facet block procedure can be helpful with diagnosis as well as providing therapeutic pain relief. One of three things may happen after the procedure:  The pain does not go away. This can mean that the pain is probably not coming from blocked facet joints. This information is helpful with diagnosis.  The pain goes away and stays away for a few hours but the original pain comes back and does not get better again. This information is also helpful with diagnosis. It can mean that pain is probably coming from the joints; but the steroid was not helpful for longer term pain control.  The pain goes away after the block, then returns later that day, and then gets better again over the next few days. This can mean that the block was helpful for pain control and the steroid had a longer lasting effect. If there is good, lasting benefit from the injections, the block may be repeated from 3 to 5 times. If there is good relief but it is only of short-term benefit, other procedures (such as radiofrequency lesioning) may be considered.  Note: The procedure cannot be performed if you have an active infection, a lesion on or near the area of injection, flu, cold, fever, very high blood pressure or if you are on blood thinners.  Please make your doctor aware of any of these conditions. This is for your safety!  LET YOUR CAREGIVER KNOW ABOUT:   Allergies.  Medications taken including herbs, eye drops, over the counter medications, and creams  Use of steroids (by mouth or creams).  Possible pregnancy, if applicable.  Previous problems with anesthetics or Novocaine.  History of blood clots.  History of bleeding or blood problems.  Previous surgery, particularly of the neck and/or back  Other health problems. RISKS AND COMPLICATIONS These are very uncommon but include:  Bleeding.  Injury to a nerve near the injection site.  Weakness or numbness in areas controlled by nerves near the injection site.  Infection.  Pain at the site of the injection.  Temporary fluid retention in those who are prone to this problem.  Allergic reaction to anesthetics or medicines used during the procedure. Diabetics may have a temporary increase in their blood sugar after any surgical procedure, especially if steroids are used. Stinging/burning of the numbing medicine is the most uncomfortable part of the procedure; however every person's response to any procedure is individual.  BEFORE THE PROCEDURE   Your caregiver will provide instructions about stopping any medication before the procedure.  Unless advised otherwise, if the injections are in your neck, you may take your medications as usual with a sip of water but do not eat or drink for 6 hours before the procedure.  Unless advised otherwise, you may eat, drink and take your medications as usual on the day of the procedure (both before and after)  if the injections are to be in your lower back.  There is no other specific preparation necessary unless advised otherwise. PROCEDURE After checking your blood pressure, the procedure will be done in the x-ray (fluoroscopy) room while lying on your stomach. For procedures in the neck, an intravenous line is usually started.  The back is then cleansed with an antiseptic soap. Sterile drapes are placed in this area. The skin is numbed with a local anesthetic. This is felt as a stinging or burning sensation. Using x-ray guidance, needles are then advanced to the appropriate locations. Once the needles are in the proper location, the anesthetic and steroid is injected through the needles and the needles are removed. The skin is then cleansed and bandages are applied. Blood pressure will be checked again, and you will be discharged to leave with your ride after your caregiver says it is okay to go.  AFTER THE PROCEDURE  You may not drive for the remainder of the day after your procedure. An adult must be present to drive you home or to go with you in a taxi or on public transportation. The procedure will be canceled if you do not have a responsible adult with you! This is for your safety.  HOME CARE INSTRUCTIONS   The bandages noted above can be removed on the morning after the procedure.  Resume medications according to your caregiver's instructions.  No heat is to be used near or over the injected area(s) for the remainder of the day.  No tub bath or soaking in water (such as a pool, jacuzzi, etc.) for the remainder of the day.  Some local tenderness may be experienced for a couple of days after the injection. Using an ice pack three or four times a day will help this.  Keep track of the amount of pain relief as well as how long the pain relief lasted. SEEK MEDICAL CARE IF:   There is drainage from the injection site.  Pain is not controlled with medications prescribed.  There is significant bleeding or swelling. SEEK IMMEDIATE MEDICAL CARE IF:   You develop a fever of 101 F (38.3 C) or greater.  Worsening pain, swelling, and/or red streaking develops in the skin around the injection site.  Severe pain develops and cannot be controlled with medications prescribed.  You develop any headache, stiff neck,  nausea, vomiting, or your eyes become very sensitive to light.  Weakness or paralysis develops in arms or legs not present before the procedure.  You develop difficulty urinating or difficulty breathing. Document Released: 03/11/2007 Document Revised: 01/12/2012 Document Reviewed: 03/01/2009 [facility] Patient Information 2013 [place], [place].

## 2012-12-18 ENCOUNTER — Other Ambulatory Visit: Payer: Self-pay

## 2012-12-19 ENCOUNTER — Other Ambulatory Visit: Payer: Self-pay | Admitting: Physical Medicine & Rehabilitation

## 2012-12-21 ENCOUNTER — Ambulatory Visit: Payer: Medicare Other | Admitting: Physical Medicine and Rehabilitation

## 2013-01-04 ENCOUNTER — Ambulatory Visit: Payer: Medicare Other | Admitting: Physical Medicine & Rehabilitation

## 2013-01-10 ENCOUNTER — Encounter: Payer: Self-pay | Admitting: Physical Medicine & Rehabilitation

## 2013-01-10 ENCOUNTER — Encounter: Payer: Medicare Other | Attending: Physical Medicine and Rehabilitation

## 2013-01-10 ENCOUNTER — Ambulatory Visit (HOSPITAL_BASED_OUTPATIENT_CLINIC_OR_DEPARTMENT_OTHER): Payer: Medicare Other | Admitting: Physical Medicine & Rehabilitation

## 2013-01-10 VITALS — BP 121/66 | HR 73 | Resp 14 | Ht 62.0 in | Wt 151.0 lb

## 2013-01-10 DIAGNOSIS — M542 Cervicalgia: Secondary | ICD-10-CM | POA: Insufficient documentation

## 2013-01-10 DIAGNOSIS — M47812 Spondylosis without myelopathy or radiculopathy, cervical region: Secondary | ICD-10-CM

## 2013-01-10 DIAGNOSIS — M47817 Spondylosis without myelopathy or radiculopathy, lumbosacral region: Secondary | ICD-10-CM

## 2013-01-10 DIAGNOSIS — M79609 Pain in unspecified limb: Secondary | ICD-10-CM | POA: Insufficient documentation

## 2013-01-10 DIAGNOSIS — M25519 Pain in unspecified shoulder: Secondary | ICD-10-CM | POA: Insufficient documentation

## 2013-01-10 DIAGNOSIS — G8928 Other chronic postprocedural pain: Secondary | ICD-10-CM

## 2013-01-10 DIAGNOSIS — M12519 Traumatic arthropathy, unspecified shoulder: Secondary | ICD-10-CM

## 2013-01-10 DIAGNOSIS — G8929 Other chronic pain: Secondary | ICD-10-CM | POA: Insufficient documentation

## 2013-01-10 DIAGNOSIS — M545 Low back pain, unspecified: Secondary | ICD-10-CM | POA: Insufficient documentation

## 2013-01-10 MED ORDER — MEPERIDINE HCL 50 MG PO TABS: 50.0000 mg | ORAL_TABLET | Freq: Three times a day (TID) | ORAL | Status: DC

## 2013-01-10 NOTE — Progress Notes (Signed)
Subjective:    Patient ID: Jenna [name], female    DOB: 11-15-1932, 79 y.o.   MRN: [id number]  HPI Right and Left hip pain improved after B MBB. Complains of left sided neck and shoulder pain. Some tingling in that area. Pain occurs when she's been reading for prolonged protime. She's had some results with cervical epidural in the past PMH:  Bilateral THA Pain Inventory Average Pain 2 Pain Right Now 5 My pain is intermittent, sharp and aching  In the last 24 hours, has pain interfered with the following? General activity 1 Relation with others 0 Enjoyment of life 1 What TIME of day is your pain at its worst? day and evening Sleep (in general) Good  Pain is worse with: standing and some activites Pain improves with: rest, medication and injections Relief from Meds: 8  Mobility walk without assistance how many minutes can you walk? 20-30 ability to climb steps?  yes do you drive?  yes Do you have any goals in this area?  yes  Function retired Do you have any goals in this area?  no  Neuro/Psych No problems in this area  Prior Studies Any changes since last visit?  no  Physicians involved in your care Any changes since last visit?  no   History reviewed. No pertinent family history. History   Social History  . Marital Status: Married    Spouse Name: N/A    Number of Children: N/A  . Years of Education: N/A   Social History Main Topics  . Smoking status: Former Games developer  . Smokeless tobacco: Former Neurosurgeon    Quit date: 01/14/1984  . Alcohol Use: 2.5 oz/week    5 drink(s) per week     Comment: occ wine w/ supper  . Drug Use: No  . Sexually Active: None   Other Topics Concern  . None   Social History Narrative  . None   History reviewed. No pertinent past surgical history. Past Medical History  Diagnosis Date  . Lumbosacral spondylosis without myelopathy   . Pain in joint, upper arm   . Other chronic postoperative pain   . Spinal stenosis, lumbar  region, without neurogenic claudication   . Osteoarthritis    BP 121/66  Pulse 73  Resp 14  Ht 5\' 2"  (1.575 m)  Wt 151 lb (68.493 kg)  BMI 27.61 kg/m2  SpO2 96%     Review of Systems  Musculoskeletal: Positive for back pain.  All other systems reviewed and are negative.       Objective:   Physical Exam  Nursing note and vitals reviewed. Constitutional: She is oriented to person, place, and time. She appears well-developed and well-nourished.  HENT:  Head: Normocephalic and atraumatic.  Eyes: Conjunctivae and EOM are normal. Pupils are equal, round, and reactive to light.  Neck: Normal range of motion.  Musculoskeletal:       Right shoulder: She exhibits decreased range of motion.       Left shoulder: She exhibits decreased range of motion.       Cervical back: She exhibits tenderness.  Atrophy left shoulder girdle  Tenderness left trapezius upper fibers  Neurological: She is alert and oriented to person, place, and time. She has normal strength. She displays atrophy. No sensory deficit. She exhibits normal muscle tone. Coordination and gait normal.  Reflex Scores:      Tricep reflexes are 2+ on the right side and 2+ on the left side.  Bicep reflexes are 0 on the right side and 2+ on the left side.      Brachioradialis reflexes are 0 on the right side and 2+ on the left side. Psychiatric: She has a normal mood and affect.          Assessment & Plan:  1. Lumbar spondylosis with good results following medial branch blocks  2. Left shoulder pain as well as neck pain. Overall appears to be radicular since it is based on neck position rather than left arm use Recommend repeat cervical epidural injection If she does not respond to the cervical epidural injection I would recommend further workup. I'll see her back for that. 3. Chronic pain syndrome we'll see PA next month to refill Demerol 50 mg 3 times a day, No signs of aberrant drug behavior, discussed risks and  benefits of Demerol in a geriatric patient. She is not exhibiting any signs of confusion or other adverse reactions. She has medication allergies to other commonly used narcotic analgesics.

## 2013-01-10 NOTE — Patient Instructions (Signed)
Please get another Cervical epidural with Dr.Ramos If it doesn't help you make an appointment to see me to reevaluate You'll see Jenna [name] next month for your medications If your back starts hurting you again we can repeat the medial branch block

## 2013-02-08 ENCOUNTER — Encounter
Payer: Medicare Other | Attending: Physical Medicine and Rehabilitation | Admitting: Physical Medicine and Rehabilitation

## 2013-02-08 ENCOUNTER — Encounter: Payer: Self-pay | Admitting: Physical Medicine and Rehabilitation

## 2013-02-08 VITALS — BP 156/55 | HR 75 | Resp 16 | Ht 62.5 in | Wt 149.2 lb

## 2013-02-08 DIAGNOSIS — M47812 Spondylosis without myelopathy or radiculopathy, cervical region: Secondary | ICD-10-CM | POA: Insufficient documentation

## 2013-02-08 DIAGNOSIS — M47817 Spondylosis without myelopathy or radiculopathy, lumbosacral region: Secondary | ICD-10-CM | POA: Insufficient documentation

## 2013-02-08 DIAGNOSIS — G8929 Other chronic pain: Secondary | ICD-10-CM | POA: Insufficient documentation

## 2013-02-08 DIAGNOSIS — M25519 Pain in unspecified shoulder: Secondary | ICD-10-CM | POA: Insufficient documentation

## 2013-02-08 MED ORDER — MEPERIDINE HCL 50 MG PO TABS: 50.0000 mg | ORAL_TABLET | Freq: Three times a day (TID) | ORAL | Status: DC

## 2013-02-08 NOTE — Patient Instructions (Signed)
Continue with your exercise program and posture training.

## 2013-02-08 NOTE — Progress Notes (Signed)
Subjective:    Patient ID: Jenna [name], female    DOB: July 23, 1933, 79 y.o.   MRN: [id number]  HPI The patient complains about chronic low back pain which radiates into right leg intermittendly.  The problem has improved. MBB, has given her great relief . Her shoulder pain is unchanged, it did not improve after the injection at her last visit.She reports that she will follow up with Dr. Ranell Patrick. He also x-rayed her cervical spine which showed spondylitic changes.He ordered an ESI into her lower C-spine,C7-T1 which was done by Dr. Marvia Pickles and gave her about 50% relief. She had another injection 1 weeks ago, which did not give her much relief yet. Her neck pain , and intermittend numbness in the left side of her neck has not improved.  Pain Inventory Average Pain 2 Pain Right Now 2 My pain is intermittent, sharp and aching  In the last 24 hours, has pain interfered with the following? General activity 1 Relation with others 0 Enjoyment of life 1 What TIME of day is your pain at its worst? day,evening time Sleep (in general) Good  Pain is worse with: standing and some activites Pain improves with: rest, medication and injections Relief from Meds: 8  Mobility walk without assistance how many minutes can you walk? 30 min. ability to climb steps?  yes do you drive?  yes Do you have any goals in this area?  no  Function retired  Neuro/Psych No problems in this area  Prior Studies Any changes since last visit?  no  Physicians involved in your care Any changes since last visit?  no   History reviewed. No pertinent family history. History   Social History  . Marital Status: Married    Spouse Name: N/A    Number of Children: N/A  . Years of Education: N/A   Social History Main Topics  . Smoking status: Former Games developer  . Smokeless tobacco: Former Neurosurgeon    Quit date: 01/14/1984  . Alcohol Use: 2.5 oz/week    5 drink(s) per week     Comment: occ wine w/ supper  . Drug  Use: No  . Sexually Active: None   Other Topics Concern  . None   Social History Narrative  . None   History reviewed. No pertinent past surgical history. Past Medical History  Diagnosis Date  . Lumbosacral spondylosis without myelopathy   . Pain in joint, upper arm   . Other chronic postoperative pain   . Spinal stenosis, lumbar region, without neurogenic claudication   . Osteoarthritis    BP 156/55  Pulse 75  Resp 16  Ht 5' 2.5" (1.588 m)  Wt 149 lb 3.2 oz (67.677 kg)  BMI 26.84 kg/m2  SpO2 98%     Review of Systems  All other systems reviewed and are negative.       Objective:   Physical Exam Constitutional: She is oriented to person, place, and time. She appears well-developed and well-nourished.  HENT:  Head: Normocephalic.  Neck: Neck supple.  Musculoskeletal: She exhibits tenderness.  Neurological: She is alert and oriented to person, place, and time.  Skin: Skin is warm and dry.  Psychiatric: She has a normal mood and affect.  Symmetric normal motor tone is noted throughout. Normal muscle bulk. Muscle testing reveals 5/5 muscle strength of the upper extremity, and 5/5 of the lower extremity. Full range of motion in upper and lower extremities, except external rotation in abduction of the left shoulder, abduction 85, external  rotation 30 degrees. ROM of spine is restricted, rotation to the right 50 degrees, to the left 45 degrees. Fine motor movements are normal in both hands.  Sensory is intact and symmetric to light touch, pinprick and proprioception.  DTR in the upper and lower extremity are present and symmetric 1+. No clonus is noted.  Patient arises from chair without difficulty. Narrow based gait with normal arm swing bilateral .        Assessment & Plan:  This is a 79 year old female with  1.Lumbar spondylosis without evidence of radiculopathy. S/p medial branch blocks, got 40% or more relief after the injections. Mild exacerbation after bending  forward to pick up something, might consider repeat MBB if it gets worse.  2. Chronic shoulder pain status post open rotator cuff surgery times 2 on the left and x1 on the right. She's also had a right bicipital tendon tear in the past. Not improved after left shoulder injection at last visit. Showed and practice exercises for her shoulders and neck at this visit. She wants to follow up with Dr. Ranell Patrick for her left shoulder pain and neck symptoms, the last cervical ESI did not give her much relief, she would like to talk to Dr. Ranell Patrick about a possible referral to a neurosurgeon. 3. Left sided neck pain, X-ray showed spondylosis of C-spine, with increased muscle tone in her left trapezius , epidural injection into lower C-spine,C7-T1, with 50% relief, had one repeat ESI in her C-spine, which helped also, but not quite as much as the first injection. the last cervical ESI, one week ago, did not give her much relief, she would like to talk to Dr. Ranell Patrick about a possible referral to a neurosurgeon. Plan :  Patient should do those exercises we practiced, at home, she also should try to continue with her walking program. Showed patients some exercises to improve her posture. Consider PT with modalities for her neck and shoulder.  Follow up in in 6 weeks.

## 2013-03-08 ENCOUNTER — Encounter: Payer: Self-pay | Admitting: Physical Medicine and Rehabilitation

## 2013-03-08 ENCOUNTER — Encounter
Payer: Medicare Other | Attending: Physical Medicine and Rehabilitation | Admitting: Physical Medicine and Rehabilitation

## 2013-03-08 VITALS — BP 131/45 | HR 66 | Resp 14 | Ht 62.0 in | Wt 151.0 lb

## 2013-03-08 DIAGNOSIS — M542 Cervicalgia: Secondary | ICD-10-CM

## 2013-03-08 DIAGNOSIS — G8929 Other chronic pain: Secondary | ICD-10-CM | POA: Insufficient documentation

## 2013-03-08 DIAGNOSIS — M47817 Spondylosis without myelopathy or radiculopathy, lumbosacral region: Secondary | ICD-10-CM

## 2013-03-08 DIAGNOSIS — M25519 Pain in unspecified shoulder: Secondary | ICD-10-CM | POA: Insufficient documentation

## 2013-03-08 DIAGNOSIS — M47812 Spondylosis without myelopathy or radiculopathy, cervical region: Secondary | ICD-10-CM

## 2013-03-08 MED ORDER — MEPERIDINE HCL 50 MG PO TABS: 50.0000 mg | ORAL_TABLET | Freq: Three times a day (TID) | ORAL | Status: DC

## 2013-03-08 NOTE — Patient Instructions (Signed)
Try to stay as active as tolerated, try to keep a good posture.

## 2013-03-08 NOTE — Progress Notes (Signed)
Subjective:    Patient ID: Jenna [name], female    DOB: October 06, 1933, 79 y.o.   MRN: [id number]  HPI The patient complains about chronic low back pain which radiates into right leg intermittendly.  The problem has improved. MBB, has given her great relief . Her shoulder pain is unchanged, it did not improve after the injection at her last visit.She has followed up with Dr. Ranell Patrick. He also x-rayed her cervical spine which showed moderate to severe spondylitic changes.He ordered an ESI into her lower C-spine,C7-T1 which was done by Dr. Marvia Pickles and gave her about 50% relief. She had another injection , which did not give her much relief . Her neck pain , and intermittend numbness in the left side of her neck has improved, but since yesterday it has flared up some. The patient states, that she is applying heat, and is taking her muscle relaxant, which has helped.  Pain Inventory Average Pain 3 Pain Right Now 3 My pain is intermittent and aching  In the last 24 hours, has pain interfered with the following? General activity 3 Relation with others 0 Enjoyment of life 2 What TIME of day is your pain at its worst? day and evening Sleep (in general) Fair  Pain is worse with: standing and some activites Pain improves with: rest, medication and injections Relief from Meds: 7  Mobility walk without assistance how many minutes can you walk? 30 ability to climb steps?  yes do you drive?  yes Do you have any goals in this area?  yes  Function retired Do you have any goals in this area?  no  Neuro/Psych No problems in this area  Prior Studies Any changes since last visit?  no  Physicians involved in your care Any changes since last visit?  no   History reviewed. No pertinent family history. History   Social History  . Marital Status: Married    Spouse Name: N/A    Number of Children: N/A  . Years of Education: N/A   Social History Main Topics  . Smoking status: Former Games developer   . Smokeless tobacco: Former Neurosurgeon    Quit date: 01/14/1984  . Alcohol Use: 2.5 oz/week    5 drink(s) per week     Comment: occ wine w/ supper  . Drug Use: No  . Sexually Active: None   Other Topics Concern  . None   Social History Narrative  . None   History reviewed. No pertinent past surgical history. Past Medical History  Diagnosis Date  . Lumbosacral spondylosis without myelopathy   . Pain in joint, upper arm   . Other chronic postoperative pain   . Spinal stenosis, lumbar region, without neurogenic claudication   . Osteoarthritis    BP 131/45  Pulse 66  Resp 14  Ht 5\' 2"  (1.575 m)  Wt 151 lb (68.493 kg)  BMI 27.61 kg/m2  SpO2 99%     Review of Systems  HENT: Positive for neck pain.   Musculoskeletal: Positive for back pain.  All other systems reviewed and are negative.       Objective:   Physical Exam  Constitutional: She is oriented to person, place, and time. She appears well-developed and well-nourished.  HENT:  Head: Normocephalic.  Neck: Neck supple.  Musculoskeletal: She exhibits tenderness.  Neurological: She is alert and oriented to person, place, and time.  Skin: Skin is warm and dry.  Psychiatric: She has a normal mood and affect.  Symmetric normal motor tone  is noted throughout. Normal muscle bulk. Muscle testing reveals 5/5 muscle strength of the upper extremity, and 5/5 of the lower extremity. Full range of motion in upper and lower extremities, except external rotation in abduction of the left shoulder, abduction 85, external rotation 30 degrees. ROM of spine is restricted, rotation to the right 50 degrees, to the left 45 degrees. Fine motor movements are normal in both hands.  Sensory is intact and symmetric to light touch, pinprick and proprioception.  DTR in the upper and lower extremity are present and symmetric 1+. No clonus is noted.  Patient arises from chair without difficulty. Narrow based gait with normal arm swing bilateral  .       Assessment & Plan:  This is a 79 year old female with  1.Lumbar spondylosis without evidence of radiculopathy. S/p medial branch blocks, got 40% or more relief after the injections.Stable at this point. 2. Chronic shoulder pain status post open rotator cuff surgery times 2 on the left and x1 on the right. She's also had a right bicipital tendon tear in the past. Not improved after left shoulder injection at last visit. Patient is doing exercises for her shoulders and neck, and also posture training.  3. Left sided neck pain, X-ray showed spondylosis of C-spine, with increased muscle tone in her left trapezius . MRI of C-spine showed moderate to severe spondylosis, with spinal stenosis and foraminal stenosis at multiple levels, also spondylolisthesis, report is in her chart. Patient had epidural injection into lower C-spine,C7-T1, with 50% relief, had one repeat ESI in her C-spine, which helped also, but not quite as much as the first injection. The last cervical ESI, did not give her much relief, she is thinking about a possible referral to a neurosurgeon, she has seen Dr. Danielle Dess in the past. If the recent flare up of her neck pain will not decrease , she might call us for a referral. Plan :  Patient should do those exercises we practiced, at home, she also should try to continue with her walking program. Showed patients some exercises to improve her posture.   Follow up in in 6 weeks.

## 2013-03-31 ENCOUNTER — Telehealth: Payer: Self-pay | Admitting: *Deleted

## 2013-03-31 NOTE — Telephone Encounter (Signed)
[facility] FORM ON YOUR DESK TO FILL OUT FOR PATIENT MEDICAITON ESTRADIOL.

## 2013-03-31 NOTE — Telephone Encounter (Signed)
Forms are completed and being sent back to you.

## 2013-04-05 ENCOUNTER — Telehealth: Payer: Self-pay | Admitting: *Deleted

## 2013-04-05 NOTE — Telephone Encounter (Signed)
PA APPROVAL RECEIVED FROM [facility].  FOR ESTRADIOL PATCH. PHARMACY NOTIFIED AND PATIENT NOTIFIED.

## 2013-04-19 ENCOUNTER — Encounter: Payer: Self-pay | Admitting: Physical Medicine and Rehabilitation

## 2013-04-19 ENCOUNTER — Encounter
Payer: Medicare Other | Attending: Physical Medicine and Rehabilitation | Admitting: Physical Medicine and Rehabilitation

## 2013-04-19 VITALS — BP 138/50 | HR 61 | Resp 16 | Ht 62.0 in | Wt 151.0 lb

## 2013-04-19 DIAGNOSIS — M79609 Pain in unspecified limb: Secondary | ICD-10-CM | POA: Insufficient documentation

## 2013-04-19 DIAGNOSIS — M25519 Pain in unspecified shoulder: Secondary | ICD-10-CM | POA: Insufficient documentation

## 2013-04-19 DIAGNOSIS — M542 Cervicalgia: Secondary | ICD-10-CM

## 2013-04-19 DIAGNOSIS — M47812 Spondylosis without myelopathy or radiculopathy, cervical region: Secondary | ICD-10-CM | POA: Insufficient documentation

## 2013-04-19 DIAGNOSIS — R209 Unspecified disturbances of skin sensation: Secondary | ICD-10-CM | POA: Insufficient documentation

## 2013-04-19 DIAGNOSIS — M47817 Spondylosis without myelopathy or radiculopathy, lumbosacral region: Secondary | ICD-10-CM | POA: Insufficient documentation

## 2013-04-19 DIAGNOSIS — Z79899 Other long term (current) drug therapy: Secondary | ICD-10-CM

## 2013-04-19 DIAGNOSIS — Z5181 Encounter for therapeutic drug level monitoring: Secondary | ICD-10-CM

## 2013-04-19 DIAGNOSIS — G8929 Other chronic pain: Secondary | ICD-10-CM | POA: Insufficient documentation

## 2013-04-19 MED ORDER — MEPERIDINE HCL 50 MG PO TABS: 50.0000 mg | ORAL_TABLET | Freq: Three times a day (TID) | ORAL | Status: DC

## 2013-04-19 NOTE — Progress Notes (Signed)
Subjective:    Patient ID: Jenna [name], female    DOB: 05-29-1933, 79 y.o.   MRN: [id number]  HPI The patient complains about chronic low back pain which radiates into right leg intermittendly.  The problem has improved. MBB, has given her great relief . Her shoulder pain is unchanged, it did not improve after the injection at her last visit.She has followed up with Dr. Ranell Patrick. He also x-rayed her cervical spine which showed moderate to severe spondylitic changes.He ordered an ESI into her lower C-spine,C7-T1 which was done by Dr. Marvia Pickles and gave her about 50% relief. She had another injection , which did not give her much relief . Her neck pain , and intermittend numbness in the left side of her neck has improved, but since yesterday it has flared up some. The patient states, that she is applying heat, and is taking her muscle relaxant, which has helped.  Pain Inventory Average Pain 3 Pain Right Now 2 My pain is intermittent and aching  In the last 24 hours, has pain interfered with the following? General activity 2 Relation with others 0 Enjoyment of life 2 What TIME of day is your pain at its worst? day,evening Sleep (in general) Good  Pain is worse with: walking, standing and some activites Pain improves with: rest, medication and injections Relief from Meds: 7  Mobility walk without assistance how many minutes can you walk? 20-30 Do you have any goals in this area?  yes  Function retired  Neuro/Psych No problems in this area  Prior Studies Any changes since last visit?  no  Physicians involved in your care Any changes since last visit?  no   History reviewed. No pertinent family history. History   Social History  . Marital Status: Married    Spouse Name: N/A    Number of Children: N/A  . Years of Education: N/A   Social History Main Topics  . Smoking status: Former Games developer  . Smokeless tobacco: Former Neurosurgeon    Quit date: 01/14/1984  . Alcohol Use: 2.5  oz/week    5 drink(s) per week     Comment: occ wine w/ supper  . Drug Use: No  . Sexually Active: None   Other Topics Concern  . None   Social History Narrative  . None   Past Surgical History  Procedure Laterality Date  . Rotator cuff repair Left 1981  . Abdominal hysterectomy    . Cholecystectomy    . Appendectomy    . Total hip arthroplasty Left 2001  . Total hip arthroplasty Right 2006  . Rotator cuff repair Right 1998   Past Medical History  Diagnosis Date  . Lumbosacral spondylosis without myelopathy   . Pain in joint, upper arm   . Other chronic postoperative pain   . Spinal stenosis, lumbar region, without neurogenic claudication   . Osteoarthritis    BP 138/50  Pulse 61  Resp 16  Ht 5\' 2"  (1.575 m)  Wt 151 lb (68.493 kg)  BMI 27.61 kg/m2  SpO2 99%     Review of Systems  All other systems reviewed and are negative.       Objective:   Physical Exam Constitutional: She is oriented to person, place, and time. She appears well-developed and well-nourished.  HENT:  Head: Normocephalic.  Neck: Neck supple.  Musculoskeletal: She exhibits tenderness.  Neurological: She is alert and oriented to person, place, and time.  Skin: Skin is warm and dry.  Psychiatric: She has  a normal mood and affect.  Symmetric normal motor tone is noted throughout. Normal muscle bulk. Muscle testing reveals 5/5 muscle strength of the upper extremity, and 5/5 of the lower extremity. Full range of motion in upper and lower extremities, except external rotation in abduction of the left shoulder, abduction 85, external rotation 30 degrees. ROM of spine is restricted, rotation to the right 50 degrees, to the left 45 degrees. Fine motor movements are normal in both hands.  Sensory is intact and symmetric to light touch, pinprick and proprioception.  DTR in the upper and lower extremity are present and symmetric 1+. No clonus is noted.  Patient arises from chair without difficulty.  Narrow based gait with normal arm swing bilateral         Assessment & Plan:  This is a 79 year old female with  1.Lumbar spondylosis without evidence of radiculopathy. S/p medial branch blocks, got 40% or more relief after the injections.Stable at this point.  2. Chronic shoulder pain status post open rotator cuff surgery times 2 on the left and x1 on the right. She's also had a right bicipital tendon tear in the past. Not improved after left shoulder injection at last visit. Patient is doing exercises for her shoulders and neck, and also posture training.  3. Left sided neck pain, X-ray showed spondylosis of C-spine, with increased muscle tone in her left trapezius .  MRI of C-spine showed moderate to severe spondylosis, with spinal stenosis and foraminal stenosis at multiple levels, also spondylolisthesis, report is in her chart.  Patient had epidural injection into lower C-spine,C7-T1, with 50% relief, had one repeat ESI in her C-spine, which helped also, but not quite as much as the first injection. The last cervical ESI, did not give her much relief, she is thinking about a possible referral to a neurosurgeon, she has seen Dr. Danielle Dess in the past. If the recent flare up of her neck pain will not decrease , she might call us for a referral. She has been referred to Dr. Danielle Dess by Dr. Marvia Pickles , and has an appointment on 06/02/2013. Plan :  Patient should do those exercises we practiced, at home, she also should try to continue with her walking program. Showed patients some exercises to improve her posture.  Follow up in in 6 weeks.

## 2013-04-19 NOTE — Patient Instructions (Signed)
Continue with your exercise program and keeping a good posture

## 2013-04-20 ENCOUNTER — Encounter: Payer: Self-pay | Admitting: *Deleted

## 2013-04-28 ENCOUNTER — Other Ambulatory Visit: Payer: Self-pay | Admitting: Gastroenterology

## 2013-04-28 DIAGNOSIS — R1310 Dysphagia, unspecified: Secondary | ICD-10-CM

## 2013-04-29 ENCOUNTER — Other Ambulatory Visit: Payer: Medicare Other

## 2013-05-02 ENCOUNTER — Ambulatory Visit
Admission: RE | Admit: 2013-05-02 | Discharge: 2013-05-02 | Disposition: A | Discharge status: Home / Self Care | Payer: Medicare Other | Source: Ambulatory Visit | Attending: Gastroenterology | Admitting: Gastroenterology

## 2013-05-02 DIAGNOSIS — R1310 Dysphagia, unspecified: Secondary | ICD-10-CM

## 2013-05-03 ENCOUNTER — Ambulatory Visit: Payer: Self-pay | Admitting: Nurse Practitioner

## 2013-05-16 ENCOUNTER — Encounter: Payer: Self-pay | Admitting: Nurse Practitioner

## 2013-05-16 ENCOUNTER — Ambulatory Visit (INDEPENDENT_AMBULATORY_CARE_PROVIDER_SITE_OTHER): Payer: Medicare Other | Admitting: Nurse Practitioner

## 2013-05-16 VITALS — BP 126/62 | HR 72 | Resp 12 | Ht 62.5 in | Wt 152.4 lb

## 2013-05-16 DIAGNOSIS — Z01419 Encounter for gynecological examination (general) (routine) without abnormal findings: Secondary | ICD-10-CM

## 2013-05-16 DIAGNOSIS — N952 Postmenopausal atrophic vaginitis: Secondary | ICD-10-CM

## 2013-05-16 MED ORDER — ESTRADIOL 0.025 MG/24HR TD PTTW: 1.0000 | MEDICATED_PATCH | TRANSDERMAL | Status: DC

## 2013-05-16 NOTE — Patient Instructions (Signed)

## 2013-05-16 NOTE — Progress Notes (Signed)
79 y.o. G1P1 Married Caucasian Fe here for annual exam.  Recent problems with spinal stenosis and affecting nerves and pain in back of neck.   MRI was done and will see  Dr. Danielle Dess on 7/30.  Cervical epidural with minimal help. No other GYN issues.  No LMP recorded. Patient has had a hysterectomy.          Sexually active: yes  The current method of family planning is status post hysterectomy.    Exercising: no  The patient does not participate in regular exercise at present. Smoker:  no  Health Maintenance: Pap:   MMG:  02/2013 normal Colonoscopy:  06/2003 BMD:   02/2013 normal TDaP:  PCP maintains tetanus.   Labs: PCP maintains all labs and urine.    reports that she has quit smoking. She quit smokeless tobacco use about 29 years ago. She reports that she drinks about 3.0 ounces of alcohol per week. She reports that she does not use illicit drugs.  Past Medical History  Diagnosis Date  . Lumbosacral spondylosis without myelopathy   . Pain in joint, upper arm   . Other chronic postoperative pain   . Spinal stenosis, lumbar region, without neurogenic claudication   . Osteoarthritis   . Melanoma     on left leg  . Thyroid disease     hypothyroidism  . Hypercholesteremia   . Cervical spinal stenosis     Past Surgical History  Procedure Laterality Date  . Rotator cuff repair Left 1981  . Abdominal hysterectomy    . Cholecystectomy    . Appendectomy    . Total hip arthroplasty Left 2001  . Total hip arthroplasty Right 2006  . Rotator cuff repair Right 1998  . Vaginal delivery    . Rotator cuff  Bilateral 1996/1998    repair  . Lipoma excision Left 10/1999    left thigh  . Total hip arthroplasty Left     10/2000  . Lung biopsy  05/2001  . Breast biopsy Right 1997  . Lumbosacral cyst  2005    Current Outpatient Prescriptions  Medication Sig Dispense Refill  . aspirin 81 MG tablet Take 160 mg by mouth daily.      [name] atenolol (TENORMIN) 25 MG tablet Take 25 mg by mouth  daily.      [name] atorvastatin (LIPITOR) 40 MG tablet Take 40 mg by mouth daily.      . Biotin 1 MG CAPS Take by mouth.      . calcium carbonate (OS-CAL) 600 MG TABS Take 600 mg by mouth 2 (two) times daily.      . cholecalciferol (VITAMIN D) 1000 UNITS tablet Take 1,000 Units by mouth daily.      [name] estradiol (VIVELLE-DOT) 0.025 MG/24HR Place 1 patch onto the skin once a week.      . fluticasone (FLONASE) 50 MCG/ACT nasal spray Place 2 sprays into the nose 2 (two) times daily.      . hyoscyamine (LEVSIN SL) 0.125 MG SL tablet Place 0.25 mg under the tongue every 4 (four) hours as needed.      [name] ibuprofen (ADVIL,MOTRIN) 200 MG tablet Take 400 mg by mouth 2 (two) times daily.      [name] levothyroxine (SYNTHROID, LEVOTHROID) 137 MCG tablet Take 137 mcg by mouth daily.      . meperidine (DEMEROL) 50 MG tablet Take 1 tablet (50 mg total) by mouth 3 (three) times daily.  90 tablet  0  . methocarbamol (ROBAXIN) 500  MG tablet Take 250 mg by mouth 3 (three) times daily as needed.      . Multiple Vitamin (MULTIVITAMIN) tablet Take 1 tablet by mouth daily.      [name] omeprazole (PRILOSEC) 40 MG capsule Take 40 mg by mouth daily.      . promethazine (PHENERGAN) 25 MG tablet TAKE 1 TABLET BY MOUTH TWICE DAILY  60 tablet  5  . triamterene-hydrochlorothiazide (MAXZIDE) 75-50 MG per tablet Take 1 tablet by mouth daily.       No current facility-administered medications for this visit.    History reviewed. No pertinent family history.  ROS:  Pertinent items are noted in HPI.  Otherwise, a comprehensive ROS was negative.  Exam:   BP 126/62  Pulse 72  Resp 12  Ht 5' 2.5" (1.588 m)  Wt 152 lb 6.4 oz (69.128 kg)  BMI 27.41 kg/m2 Height: 5' 2.5" (158.8 cm)  Ht Readings from Last 3 Encounters:  05/16/13 5' 2.5" (1.588 m)  04/19/13 5\' 2"  (1.575 m)  03/08/13 5\' 2"  (1.575 m)    General appearance: alert, cooperative and appears stated age Head: Normocephalic, without obvious abnormality, atraumatic Neck: no  adenopathy, supple, symmetrical, trachea midline and thyroid normal to inspection and palpation Lungs: clear to auscultation bilaterally Breasts: normal appearance, no masses or tenderness Heart: regular rate and rhythm Abdomen: soft, non-tender; no masses,  no organomegaly Extremities: extremities normal, atraumatic, no cyanosis or edema Skin: Skin color, texture, turgor normal. No rashes or lesions Lymph nodes: Cervical, supraclavicular, and axillary nodes normal. No abnormal inguinal nodes palpated Neurologic: Grossly normal   Pelvic: External genitalia:  no lesions              Urethra:  normal appearing urethra with no masses, tenderness or lesions              Bartholin's and Skene's: normal                 Vagina: normal appearing vagina with normal color and discharge, no lesions              Cervix: absent              Pap taken: no Bimanual Exam:  Uterus:  uterus absent              Adnexa: no mass, fullness, tenderness               Rectovaginal: Confirms               Anus:  normal sphincter tone, no lesions  A:  Well Woman with normal exam  S/P TAH/ BSO 1982 on ERT tapering dose  Chronic neck pain - DDD seeing pain specialist  IBS, hypercholesterolemia, HTN, hypothyroid  P:   Pap smear as per guidelines   Mammogram due 02/2014  Refill on Vivelle dot 0.25 mg 2 times a week - she is only taking 1 per week most  of time - aware to come off med's if she needs to have surgery to reduce risk for  DVT  Counseled on potential risk such as CVA, DVT, cancer, etc.  counseled on breast self exam, adequate intake of calcium and vitamin D,   diet and exercise, Kegel's exercises return annually or prn  An After Visit Summary was printed and given to the patient.

## 2013-05-17 ENCOUNTER — Telehealth: Payer: Self-pay | Admitting: Nurse Practitioner

## 2013-05-17 MED ORDER — ESTRADIOL 10 MCG VA TABS: 10.0000 ug | ORAL_TABLET | VAGINAL | Status: DC

## 2013-05-17 MED ORDER — ESTRADIOL 0.025 MG/24HR TD PTTW: 1.0000 | MEDICATED_PATCH | TRANSDERMAL | Status: DC

## 2013-05-17 NOTE — Telephone Encounter (Signed)
Patient was called back and explained that an error was made and she would like for me to call the other patient and see if she has her form.  I think this is reasonable and will call Ms. Ingle to make sure is she has the wrong form.  I then will call patient back and try to further clarify.    She did want a refill on Vagifem as well which I did not refill yesterday since not on med list - refill order put through today.

## 2013-05-17 NOTE — Telephone Encounter (Signed)
Patient states she received an AVS with another patient's health information given to her by "the girl who put her in took her vitals and put her in the exam room."  Does not want to speak with nurse, wants to speak with Shirlyn Goltz only. Please update office manager after speaking with the patient. Please request again that the patient return the incorrect AVS to our office. Ms. Badon AVS was not printed yesterday, so the other patient did not get hers.

## 2013-05-17 NOTE — Telephone Encounter (Signed)
Patient pharmacy called to give request per patient of Estradiol patch 0.25mg  to apply once weekly. Approval for this given per  Shirlyn Goltz. [facility] Rx sent to [facility].

## 2013-05-19 NOTE — Progress Notes (Signed)
Encounter reviewed by Dr. Lavena Loretto Silva.  

## 2013-06-03 ENCOUNTER — Encounter
Payer: Medicare Other | Attending: Physical Medicine and Rehabilitation | Admitting: Physical Medicine and Rehabilitation

## 2013-06-03 ENCOUNTER — Encounter: Payer: Self-pay | Admitting: Physical Medicine and Rehabilitation

## 2013-06-03 VITALS — BP 148/72 | HR 65 | Resp 15 | Ht 62.0 in | Wt 149.0 lb

## 2013-06-03 DIAGNOSIS — M47812 Spondylosis without myelopathy or radiculopathy, cervical region: Secondary | ICD-10-CM | POA: Insufficient documentation

## 2013-06-03 DIAGNOSIS — M47817 Spondylosis without myelopathy or radiculopathy, lumbosacral region: Secondary | ICD-10-CM | POA: Insufficient documentation

## 2013-06-03 DIAGNOSIS — G8929 Other chronic pain: Secondary | ICD-10-CM | POA: Insufficient documentation

## 2013-06-03 DIAGNOSIS — M25519 Pain in unspecified shoulder: Secondary | ICD-10-CM | POA: Insufficient documentation

## 2013-06-03 MED ORDER — MEPERIDINE HCL 50 MG PO TABS: 50.0000 mg | ORAL_TABLET | Freq: Three times a day (TID) | ORAL | Status: DC

## 2013-06-03 NOTE — Progress Notes (Signed)
Subjective:    Patient ID: Jenna [name], female    DOB: 12-Jul-1933, 79 y.o.   MRN: [id number]  HPI The patient complains about chronic low back pain which radiates into right leg intermittendly.  The problem has improved. MBB, has given her great relief . Her shoulder pain is unchanged, it did not improve after the injection at her last visit.She has followed up with Dr. Ranell Patrick. He also x-rayed her cervical spine which showed moderate to severe spondylitic changes.He ordered an ESI into her lower C-spine,C7-T1 which was done by Dr. Marvia Pickles and gave her about 50% relief. She had another injection , which did not give her much relief . Her neck pain , and intermittend numbness in the left side of her neck has been stable. The patient states, that she is applying heat, and is taking her muscle relaxant, which has helped. She reports that she has followed up with Dr. Danielle Dess, who ordered a new MRI.  Pain Inventory Average Pain 3 Pain Right Now na My pain is intermittent and aching  In the last 24 hours, has pain interfered with the following? General activity 3 Relation with others 0 Enjoyment of life 3 What TIME of day is your pain at its worst? day, evening Sleep (in general) Fair  Pain is worse with: walking, standing and some activites Pain improves with: rest, medication and injections Relief from Meds: 7  Mobility walk without assistance how many minutes can you walk? 20 ability to climb steps?  yes do you drive?  yes Do you have any goals in this area?  yes  Function retired Do you have any goals in this area?  yes  Neuro/Psych numbness tingling  Prior Studies Any changes since last visit?  no  Physicians involved in your care Any changes since last visit?  no   Family History  Problem Relation Age of Onset  . Stroke Mother   . Heart attack Father   . Diabetes Brother   . Heart disease Brother    History   Social History  . Marital Status: Married   Spouse Name: N/A    Number of Children: N/A  . Years of Education: N/A   Social History Main Topics  . Smoking status: Former Smoker -- 1.00 packs/day for 15 years    Quit date: 11/03/1973  . Smokeless tobacco: Never Used  . Alcohol Use: 3.0 oz/week    6 drink(s) per week     Comment: occ wine w/ supper  . Drug Use: No  . Sexually Active: Yes    Birth Control/ Protection: Surgical     Comment: hysterectomy   Other Topics Concern  . None   Social History Narrative  . None   Past Surgical History  Procedure Laterality Date  . Rotator cuff repair Left 1996; 1981  . Cholecystectomy    . Total hip arthroplasty Left 10/2000  . Total hip arthroplasty Right 06/2005  . Rotator cuff repair Right 1996  . Vaginal delivery    . Rotator cuff  Bilateral 1996/1998    repair  . Lipoma excision Left 10/1999    left thigh  . Total hip arthroplasty Left     10/2000  . Lung biopsy  05/2001  . Lumbosacral cyst  2005  . Appendectomy  1986    along with chole  . Breast biopsy Right 1997    sclerosing adenosis -Dr. Ezzard Standing  . Abdominal hysterectomy  1982    BSO   Past Medical History  Diagnosis Date  . Lumbosacral spondylosis without myelopathy   . Pain in joint, upper arm   . Other chronic postoperative pain   . Spinal stenosis, lumbar region, without neurogenic claudication   . Osteoarthritis   . Melanoma     on left leg  . Thyroid disease     hypothyroidism  . Hypercholesteremia   . Cervical spinal stenosis   . Hypertension   . Sarcoidosis of lung 05/2001    resolved within a year   BP 148/72  Pulse 65  Resp 15  Ht 5\' 2"  (1.575 m)  Wt 149 lb (67.586 kg)  BMI 27.25 kg/m2  SpO2 97%     Review of Systems  Neurological: Positive for numbness.       Tingling  All other systems reviewed and are negative.       Objective:   Physical Exam  Constitutional: She is oriented to person, place, and time. She appears well-developed and well-nourished.  HENT:  Head:  Normocephalic.  Neck: Neck supple.  Musculoskeletal: She exhibits tenderness.  Neurological: She is alert and oriented to person, place, and time.  Skin: Skin is warm and dry.  Psychiatric: She has a normal mood and affect.  Symmetric normal motor tone is noted throughout. Normal muscle bulk. Muscle testing reveals 5/5 muscle strength of the upper extremity, and 5/5 of the lower extremity. Full range of motion in upper and lower extremities, except external rotation in abduction of the left shoulder, abduction 85, external rotation 30 degrees. ROM of spine is restricted, rotation to the right 50 degrees, to the left 45 degrees. Fine motor movements are normal in both hands.  Sensory is intact and symmetric to light touch, pinprick and proprioception.  DTR in the upper and lower extremity are present and symmetric 1+. No clonus is noted.  Patient arises from chair without difficulty. Narrow based gait with normal arm swing bilateral        Assessment & Plan:  This is a 79 year old female with  1.Lumbar spondylosis without evidence of radiculopathy. S/p medial branch blocks, got 40% or more relief after the injections.Stable at this point.  2. Chronic shoulder pain status post open rotator cuff surgery times 2 on the left and x1 on the right. She's also had a right bicipital tendon tear in the past. Not improved after left shoulder injection at last visit. Patient is doing exercises for her shoulders and neck, and also posture training.  3. Left sided neck pain, X-ray showed spondylosis of C-spine, with increased muscle tone in her left trapezius .  MRI of C-spine showed moderate to severe spondylosis, with spinal stenosis and foraminal stenosis at multiple levels, also spondylolisthesis, report is in her chart.  Patient had epidural injection into lower C-spine,C7-T1, with 50% relief, had one repeat ESI in her C-spine, which helped also, but not quite as much as the first injection. The last  cervical ESI, did not give her much relief. She has been referred to Dr. Danielle Dess by Dr. Marvia Pickles , and had an appointment on 06/02/2013.Dr. Danielle Dess ordered a new MRI, and the patient stated he might consider surgery.  Plan :  Patient should do those exercises we practiced, at home, she also should try to continue with her walking program and continue with exercises to improve her posture.  Follow up in in 6 weeks.

## 2013-06-03 NOTE — Patient Instructions (Signed)
Try to continue with keeping a good posture.

## 2013-06-07 ENCOUNTER — Other Ambulatory Visit: Payer: Self-pay | Admitting: Neurological Surgery

## 2013-06-07 DIAGNOSIS — M5000 Cervical disc disorder with myelopathy, unspecified cervical region: Secondary | ICD-10-CM

## 2013-06-08 ENCOUNTER — Other Ambulatory Visit: Payer: Self-pay

## 2013-06-10 ENCOUNTER — Telehealth: Payer: Self-pay | Admitting: *Deleted

## 2013-06-10 NOTE — Telephone Encounter (Signed)
Jenna [name] is calling saying that she had a flare up Sunday and would like to know if she can be scheduled for a back injection.

## 2013-06-10 NOTE — Telephone Encounter (Signed)
Yes may schedule as soon as possible for lumbar medial branch blocks

## 2013-06-14 ENCOUNTER — Ambulatory Visit
Admission: RE | Admit: 2013-06-14 | Discharge: 2013-06-14 | Disposition: A | Discharge status: Home / Self Care | Payer: Medicare Other | Source: Ambulatory Visit | Attending: Neurological Surgery | Admitting: Neurological Surgery

## 2013-06-14 DIAGNOSIS — M5000 Cervical disc disorder with myelopathy, unspecified cervical region: Secondary | ICD-10-CM

## 2013-07-05 ENCOUNTER — Ambulatory Visit: Payer: Medicare Other | Admitting: Physical Medicine and Rehabilitation

## 2013-07-12 ENCOUNTER — Encounter: Payer: Self-pay | Admitting: Physical Medicine & Rehabilitation

## 2013-07-12 ENCOUNTER — Encounter: Payer: Medicare Other | Attending: Physical Medicine and Rehabilitation

## 2013-07-12 ENCOUNTER — Ambulatory Visit (HOSPITAL_BASED_OUTPATIENT_CLINIC_OR_DEPARTMENT_OTHER): Payer: Medicare Other | Admitting: Physical Medicine & Rehabilitation

## 2013-07-12 VITALS — BP 81/56 | HR 74 | Resp 14 | Ht 62.0 in | Wt 149.0 lb

## 2013-07-12 DIAGNOSIS — M47812 Spondylosis without myelopathy or radiculopathy, cervical region: Secondary | ICD-10-CM | POA: Insufficient documentation

## 2013-07-12 DIAGNOSIS — M47817 Spondylosis without myelopathy or radiculopathy, lumbosacral region: Secondary | ICD-10-CM

## 2013-07-12 DIAGNOSIS — G8929 Other chronic pain: Secondary | ICD-10-CM | POA: Insufficient documentation

## 2013-07-12 DIAGNOSIS — M47816 Spondylosis without myelopathy or radiculopathy, lumbar region: Secondary | ICD-10-CM

## 2013-07-12 DIAGNOSIS — R209 Unspecified disturbances of skin sensation: Secondary | ICD-10-CM | POA: Insufficient documentation

## 2013-07-12 DIAGNOSIS — M79609 Pain in unspecified limb: Secondary | ICD-10-CM | POA: Insufficient documentation

## 2013-07-12 DIAGNOSIS — M25519 Pain in unspecified shoulder: Secondary | ICD-10-CM | POA: Insufficient documentation

## 2013-07-12 MED ORDER — METHOCARBAMOL 500 MG PO TABS: 250.0000 mg | ORAL_TABLET | Freq: Three times a day (TID) | ORAL | Status: DC | PRN

## 2013-07-12 MED ORDER — MEPERIDINE HCL 50 MG PO TABS: 50.0000 mg | ORAL_TABLET | Freq: Three times a day (TID) | ORAL | Status: DC

## 2013-07-12 NOTE — Progress Notes (Signed)
Bilateral back pain right greater than left. Some radiation toward the hip. Last medial branch blocks were very helpful on the right side performed in May of 2013. Pain has been increasing once again and now affecting left side as well  Bilateral Lumbar L3, L4  medial branch blocks and L 5 dorsal ramus injection under fluoroscopic guidance  Indication: Lumbar pain which is not relieved by medication management or other conservative care and interfering with self-care and mobility.  Informed consent was obtained after describing risks and benefits of the procedure with the patient, this includes bleeding, infection, paralysis and medication side effects.  The patient wishes to proceed and has given written consent.  The patient was placed in prone position.  The lumbar area was marked and prepped with Betadine.  One mL of 1% lidocaine was injected into each of 6 areas into the skin and subcutaneous tissue.  Then a 22-gauge 3.5 spinal needle was inserted targeting the junction of the left S1 superior articular process and sacral ala junction. Needle was advanced under fluoroscopic guidance.  Bone contact was made.  Omnipaque 180 was injected x 0.5 mL demonstrating no intravascular uptake.  Then a solution containing one mL of 4 mg per mL dexamethasone and 3 mL of 2% MPF lidocaine was injected x 0.5 mL.  Then the left L5 superior articular process in transverse process junction was targeted.  Bone contact was made.  Omnipaque 180 was injected x 0.5 mL demonstrating no intravascular uptake. Then a solution containing one mL of 4 mg per mL dexamethasone and 3 mL of 2% MPF lidocaine was injected x 0.5 mL.  Then the left L4 superior articular process in transverse process junction was targeted.  Bone contact was made.  Omnipaque 180 was injected x 0.5 mL demonstrating no intravascular uptake.  Then a solution containing one mL of 4 mg per mL dexamethasone and 3 mL if 2% MPF lidocaine was injected x 0.5 mL.  This same  procedure was performed on the right side using the same needle, technique and injectate.  Patient tolerated procedure well.  Post procedure instructions were given.

## 2013-07-12 NOTE — Patient Instructions (Signed)

## 2013-07-12 NOTE — Progress Notes (Signed)
  PROCEDURE RECORD [facility]   Name: Jenna [name] DOB:31-Aug-1933 MRN: [id number]  Date:07/12/2013  Physician: Claudette Laws, MD    Nurse/CMA: Kelli Churn, CMA  Allergies:  Allergies  Allergen Reactions  . Codeine Shortness Of Breath  . Darvon Shortness Of Breath  . Tylenol [Acetaminophen] Shortness Of Breath  . Ultram [Tramadol] Shortness Of Breath  . Vicodin [Hydrocodone-Acetaminophen] Shortness Of Breath  . Dalmane [Flurazepam Hcl] Other (See Comments)    confusion  . Cephalexin Nausea Only  . Clarithromycin Nausea Only  . Doxycycline Calcium Rash    Consent Signed: yes  Is patient diabetic? no    Pregnant: no LMP: No LMP recorded. Patient has had a hysterectomy. (age 60-55)  Anticoagulants: no Anti-inflammatory: yes (ibuprofen last night) Antibiotics: no  Procedure: Medial branch block  Position: Prone Start Time:  105 End Time: 115  Fluoro Time: 28  RN/CMA Levens, CMA Levens, CMA    Time 1243 119    BP 81/56 152/74    Pulse 74 67    Respirations 14 14    O2 Sat 94 100    S/S 6 6    Pain Level 4/10 1/10     D/C home with Husband Chrissie Noa, [name], D/C instructions reviewed, and sits independently.

## 2013-07-13 ENCOUNTER — Telehealth: Payer: Self-pay

## 2013-07-13 NOTE — Telephone Encounter (Signed)
Patient called to see where her RX for robaxin was e scribed at. Told patient the RX was at [facility] and if she had anymore questions to just give [place] a call back.

## 2013-08-09 ENCOUNTER — Encounter
Payer: Medicare Other | Attending: Physical Medicine and Rehabilitation | Admitting: Physical Medicine and Rehabilitation

## 2013-08-09 ENCOUNTER — Encounter: Payer: Self-pay | Admitting: Physical Medicine and Rehabilitation

## 2013-08-09 VITALS — BP 139/62 | HR 60 | Resp 14 | Ht 62.5 in | Wt 148.0 lb

## 2013-08-09 DIAGNOSIS — M25519 Pain in unspecified shoulder: Secondary | ICD-10-CM | POA: Insufficient documentation

## 2013-08-09 DIAGNOSIS — M545 Low back pain, unspecified: Secondary | ICD-10-CM | POA: Insufficient documentation

## 2013-08-09 DIAGNOSIS — M47812 Spondylosis without myelopathy or radiculopathy, cervical region: Secondary | ICD-10-CM

## 2013-08-09 DIAGNOSIS — Q762 Congenital spondylolisthesis: Secondary | ICD-10-CM | POA: Insufficient documentation

## 2013-08-09 DIAGNOSIS — M4802 Spinal stenosis, cervical region: Secondary | ICD-10-CM | POA: Insufficient documentation

## 2013-08-09 DIAGNOSIS — M47817 Spondylosis without myelopathy or radiculopathy, lumbosacral region: Secondary | ICD-10-CM | POA: Insufficient documentation

## 2013-08-09 DIAGNOSIS — G8929 Other chronic pain: Secondary | ICD-10-CM | POA: Insufficient documentation

## 2013-08-09 MED ORDER — MEPERIDINE HCL 50 MG PO TABS: 50.0000 mg | ORAL_TABLET | Freq: Three times a day (TID) | ORAL | Status: DC

## 2013-08-09 NOTE — Patient Instructions (Addendum)
Stay as active as tolerated, follow up with Dr. Danielle Dess for your weakness in your arms/ deltoid muscle bilateral

## 2013-08-09 NOTE — Progress Notes (Signed)
Subjective:    Patient ID: Jenna [name], female    DOB: 1933/04/23, 79 y.o.   MRN: [id number]  HPI The patient complains about chronic low back pain which radiates into right leg intermittendly.  The problem has improved. MBB, has given her great relief . Her shoulder pain is unchanged, it did not improve after the injection at her last visit.She has followed up with Dr. Ranell Patrick. He also x-rayed her cervical spine which showed moderate to severe spondylitic changes.He ordered an ESI into her lower C-spine,C7-T1 which was done by Dr. Marvia Pickles and gave her about 50% relief. She had another injection , which did not give her much relief . Her neck pain , and intermittend numbness in the left side of her neck has been stable. The patient states, that she is applying heat, and is taking her muscle relaxant, which has helped. She reports that she has followed up with Dr. Danielle Dess, who ordered a new MRI of her C-spine, which did show severe spondylosis and stenosis. The patient states, that he would consider surgery, if her Sx get more severe.Today the patient reports that her arms feel weaker and heavier. She also reports that the last injections she received into her lumbar spine, by Dr. Doroteo Bradford did not give her any relief. She states that she had relief of 30 % for about 3 days, after that her Sx were almost worse than before the injections.  Pain Inventory Average Pain 5 Pain Right Now 5 My pain is intermittent, sharp and aching  In the last 24 hours, has pain interfered with the following? General activity 5 Relation with others 2 Enjoyment of life 6 What TIME of day is your pain at its worst? day, evening and night Sleep (in general) Fair  Pain is worse with: walking, standing and some activites Pain improves with: rest, pacing activities and medication Relief from Meds: 6  Mobility walk without assistance how many minutes can you walk? 10-20 ability to climb steps?  yes do you drive?   yes Do you have any goals in this area?  yes  Function retired Do you have any goals in this area?  no  Neuro/Psych numbness  Prior Studies Any changes since last visit?  no  Physicians involved in your care Any changes since last visit?  no   Family History  Problem Relation Age of Onset  . Stroke Mother   . Heart attack Father   . Diabetes Brother   . Heart disease Brother    History   Social History  . Marital Status: Married    Spouse Name: N/A    Number of Children: N/A  . Years of Education: N/A   Social History Main Topics  . Smoking status: Former Smoker -- 1.00 packs/day for 15 years    Quit date: 11/03/1973  . Smokeless tobacco: Never Used  . Alcohol Use: 3.0 oz/week    6 drink(s) per week     Comment: occ wine w/ supper  . Drug Use: No  . Sexual Activity: Yes    Birth Control/ Protection: Surgical     Comment: hysterectomy   Other Topics Concern  . None   Social History Narrative  . None   Past Surgical History  Procedure Laterality Date  . Rotator cuff repair Left 1996; 1981  . Cholecystectomy    . Total hip arthroplasty Left 10/2000  . Total hip arthroplasty Right 06/2005  . Rotator cuff repair Right 1996  . Vaginal delivery    .  Rotator cuff  Bilateral 1996/1998    repair  . Lipoma excision Left 10/1999    left thigh  . Total hip arthroplasty Left     10/2000  . Lung biopsy  05/2001  . Lumbosacral cyst  2005  . Appendectomy  1986    along with chole  . Breast biopsy Right 1997    sclerosing adenosis -Dr. Ezzard Standing  . Abdominal hysterectomy  1982    BSO   Past Medical History  Diagnosis Date  . Lumbosacral spondylosis without myelopathy   . Pain in joint, upper arm   . Other chronic postoperative pain   . Spinal stenosis, lumbar region, without neurogenic claudication   . Osteoarthritis   . Melanoma     on left leg  . Thyroid disease     hypothyroidism  . Hypercholesteremia   . Cervical spinal stenosis   . Hypertension   .  Sarcoidosis of lung 05/2001    resolved within a year   BP 139/62  Pulse 60  Resp 14  Ht 5' 2.5" (1.588 m)  Wt 148 lb (67.132 kg)  BMI 26.62 kg/m2  SpO2 97%     Review of Systems  HENT: Positive for neck pain.   Musculoskeletal: Positive for back pain.  Neurological: Positive for numbness.  All other systems reviewed and are negative.       Objective:   Physical Exam Constitutional: She is oriented to person, place, and time. She appears well-developed and well-nourished.  HENT:  Head: Normocephalic.  Neck: Neck supple.  Musculoskeletal: She exhibits tenderness.  Neurological: She is alert and oriented to person, place, and time.  Skin: Skin is warm and dry.  Psychiatric: She has a normal mood and affect.  Symmetric normal motor tone is noted throughout. Normal muscle bulk. Muscle testing reveals 5/5 muscle strength of the upper extremity, and 5/5 of the lower extremity, except deltoid on the left 3/5, deltoid on the right 4-/5. Full range of motion in upper and lower extremities, except external rotation in abduction of the left shoulder, abduction 85, external rotation 30 degrees. ROM of spine is restricted, rotation to the right 50 degrees, to the left 45 degrees. Fine motor movements are normal in both hands.  Sensory is intact and symmetric to light touch, pinprick and proprioception.  DTR in the upper and lower extremity are present and symmetric 1+. No clonus is noted.  Patient arises from chair without difficulty. Narrow based gait with normal arm swing bilateral         Assessment & Plan:  This is a 79 year old female with  1.Lumbar spondylosis without evidence of radiculopathy. S/p medial branch blocks, got 40% or more relief after the injections usually, unfortunately the last injections, which she received on 07/12/13, by Dr. Doroteo Bradford did not work that well, she had about 30 % relief for about 3 days, after that the Sx were almost worse. 2. Chronic shoulder  pain status post open rotator cuff surgery times 2 on the left and x1 on the right. She's also had a right bicipital tendon tear in the past. Not improved after left shoulder injection at last visit. Patient is doing exercises for her shoulders and neck, and also posture training.  3. Left sided neck pain, X-ray showed spondylosis of C-spine, with increased muscle tone in her left trapezius .  MRI of C-spine showed moderate to severe spondylosis, with spinal stenosis and foraminal stenosis at multiple levels, also spondylolisthesis :  1. Multilevel cervical  spondylolisthesis with advanced disc and  facet degeneration.  2. Subsequent multilevel cervical spinal stenosis with spinal cord  mass effect, maximal at C3-C4. No associated spinal cord signal  abnormality identified.  3. Subsequent multilevel cervical neural foraminal stenosis,  severe at the bilateral C4, right C5, bilateral C6, bilateral C7,  and to a lesser extent bilateral C8 nerve levels.  4. Chronic degenerative marrow changes in the cervical spine.  Suspect marrow edema at C3-C4 also is degenerative.  Patient had epidural injection into lower C-spine,C7-T1, with 50% relief, had one repeat ESI in her C-spine, which helped also, but not quite as much as the first injection. The last cervical ESI, did not give her much relief. She has been referred to Dr. Danielle Dess by Dr. Marvia Pickles , and had an appointment on 06/02/2013. The patient states, that Dr. Danielle Dess would consider surgery, if her Sx get more severe.Today she is complaining about weakness in both of her arms, more in the right. This has started about 2-3 weeks ago. I advised her to follow up with Dr. Danielle Dess to discuss those new Sx. Asap, to make sure she does not develop permanent symptoms   Plan :  Patient should do those exercises we practiced, at home, she also should try to continue with her walking program and continue with exercises to improve her posture, as tolerated.  Follow up in  in 6 weeks.

## 2013-09-08 ENCOUNTER — Other Ambulatory Visit: Payer: Self-pay

## 2013-09-13 ENCOUNTER — Encounter
Payer: Medicare Other | Attending: Physical Medicine and Rehabilitation | Admitting: Physical Medicine and Rehabilitation

## 2013-09-13 ENCOUNTER — Encounter: Payer: Self-pay | Admitting: Physical Medicine and Rehabilitation

## 2013-09-13 VITALS — BP 144/75 | HR 72 | Resp 14 | Ht 62.0 in | Wt 147.0 lb

## 2013-09-13 DIAGNOSIS — G8929 Other chronic pain: Secondary | ICD-10-CM | POA: Insufficient documentation

## 2013-09-13 DIAGNOSIS — G894 Chronic pain syndrome: Secondary | ICD-10-CM

## 2013-09-13 DIAGNOSIS — M47812 Spondylosis without myelopathy or radiculopathy, cervical region: Secondary | ICD-10-CM

## 2013-09-13 DIAGNOSIS — M47817 Spondylosis without myelopathy or radiculopathy, lumbosacral region: Secondary | ICD-10-CM | POA: Insufficient documentation

## 2013-09-13 DIAGNOSIS — Z5181 Encounter for therapeutic drug level monitoring: Secondary | ICD-10-CM

## 2013-09-13 DIAGNOSIS — M25519 Pain in unspecified shoulder: Secondary | ICD-10-CM | POA: Insufficient documentation

## 2013-09-13 DIAGNOSIS — Z79899 Other long term (current) drug therapy: Secondary | ICD-10-CM

## 2013-09-13 DIAGNOSIS — M4310 Spondylolisthesis, site unspecified: Secondary | ICD-10-CM | POA: Insufficient documentation

## 2013-09-13 DIAGNOSIS — M5030 Other cervical disc degeneration, unspecified cervical region: Secondary | ICD-10-CM | POA: Insufficient documentation

## 2013-09-13 MED ORDER — MEPERIDINE HCL 50 MG PO TABS: 50.0000 mg | ORAL_TABLET | Freq: Three times a day (TID) | ORAL | Status: DC

## 2013-09-13 MED ORDER — PROMETHAZINE HCL 25 MG PO TABS: ORAL_TABLET | ORAL | Status: DC

## 2013-09-13 MED ORDER — METHOCARBAMOL 500 MG PO TABS: 250.0000 mg | ORAL_TABLET | Freq: Three times a day (TID) | ORAL | Status: DC | PRN

## 2013-09-13 NOTE — Progress Notes (Signed)
Subjective:    Patient ID: Jenna [name], female    DOB: Sep 21, 1933, 79 y.o.   MRN: [id number]  HPI The patient complains about chronic low back pain which radiates into right leg intermittendly.  The problem has improved. MBB, has given her great relief . Her shoulder pain is unchanged, it did not improve after the injection at her last visit.She has followed up with Dr. Ranell Patrick. He also x-rayed her cervical spine which showed moderate to severe spondylitic changes.He ordered an ESI into her lower C-spine,C7-T1 which was done by Dr. Marvia Pickles and gave her about 50% relief. She had another injection , which did not give her much relief . Her neck pain , and intermittend numbness in the left side of her neck has been stable. The patient states, that she is applying heat, and is taking her muscle relaxant, which has helped. She reports that she has followed up with Dr. Danielle Dess, who ordered a new MRI of her C-spine, which did show severe spondylosis and stenosis. The patient states, that he would consider surgery, if her Sx get more severe. She also reports that the last injections she received into her lumbar spine, by Dr. Doroteo Bradford did not give her any relief. She states that she had relief of 30 % for about 3 days, after that her Sx were almost worse than before the injections.   Pain Inventory Average Pain 3 Pain Right Now 1 My pain is intermittent and aching  In the last 24 hours, has pain interfered with the following? General activity 2 Relation with others 0 Enjoyment of life 1 What TIME of day is your pain at its worst? daytime and evening Sleep (in general) Fair  Pain is worse with: walking, standing and some activites Pain improves with: rest and medication Relief from Meds: 7  Mobility walk without assistance how many minutes can you walk? 10 ability to climb steps?  yes do you drive?  yes  Function retired  Neuro/Psych weakness numbness trouble walking  Prior  Studies Any changes since last visit?  no  Physicians involved in your care Any changes since last visit?  no   Family History  Problem Relation Age of Onset  . Stroke Mother   . Heart attack Father   . Diabetes Brother   . Heart disease Brother    History   Social History  . Marital Status: Married    Spouse Name: N/A    Number of Children: N/A  . Years of Education: N/A   Social History Main Topics  . Smoking status: Former Smoker -- 1.00 packs/day for 15 years    Quit date: 11/03/1973  . Smokeless tobacco: Never Used  . Alcohol Use: 3.0 oz/week    6 drink(s) per week     Comment: occ wine w/ supper  . Drug Use: No  . Sexual Activity: Yes    Birth Control/ Protection: Surgical     Comment: hysterectomy   Other Topics Concern  . None   Social History Narrative  . None   Past Surgical History  Procedure Laterality Date  . Rotator cuff repair Left 1996; 1981  . Cholecystectomy    . Total hip arthroplasty Left 10/2000  . Total hip arthroplasty Right 06/2005  . Rotator cuff repair Right 1996  . Vaginal delivery    . Rotator cuff  Bilateral 1996/1998    repair  . Lipoma excision Left 10/1999    left thigh  . Total hip arthroplasty Left  10/2000  . Lung biopsy  05/2001  . Lumbosacral cyst  2005  . Appendectomy  1986    along with chole  . Breast biopsy Right 1997    sclerosing adenosis -Dr. Ezzard Standing  . Abdominal hysterectomy  1982    BSO   Past Medical History  Diagnosis Date  . Lumbosacral spondylosis without myelopathy   . Pain in joint, upper arm   . Other chronic postoperative pain   . Spinal stenosis, lumbar region, without neurogenic claudication   . Osteoarthritis   . Melanoma     on left leg  . Thyroid disease     hypothyroidism  . Hypercholesteremia   . Cervical spinal stenosis   . Hypertension   . Sarcoidosis of lung 05/2001    resolved within a year   There were no vitals taken for this visit.   Review of Systems   Musculoskeletal: Positive for back pain, gait problem and neck pain.  Neurological: Positive for weakness and numbness.  All other systems reviewed and are negative.  BP 144/75  P 72  R 14 WT 147 lb HT 62" O2 sat 100%     Objective:   Physical Exam Constitutional: She is oriented to person, place, and time. She appears well-developed and well-nourished.  HENT:  Head: Normocephalic.  Neck: Neck supple.  Musculoskeletal: She exhibits tenderness.  Neurological: She is alert and oriented to person, place, and time.  Skin: Skin is warm and dry.  Psychiatric: She has a normal mood and affect.  Symmetric normal motor tone is noted throughout. Normal muscle bulk. Muscle testing reveals 5/5 muscle strength of the upper extremity, and 5/5 of the lower extremity, except deltoid on the left 3/5, deltoid on the right 4-/5. Full range of motion in upper and lower extremities, except external rotation in abduction of the left shoulder, abduction 85, external rotation 30 degrees. ROM of spine is restricted, rotation to the right 50 degrees, to the left 45 degrees. Fine motor movements are normal in both hands.  Sensory is intact and symmetric to light touch, pinprick and proprioception.  DTR in the upper and lower extremity are present and symmetric 1+. No clonus is noted.  Patient arises from chair without difficulty. Narrow based gait with normal arm swing bilateral         Assessment & Plan:  This is a 79 year old female with  1.Lumbar spondylosis without evidence of radiculopathy. S/p medial branch blocks, got 40% or more relief after the injections usually, unfortunately the last injections, which she received on 07/12/13, by Dr. Doroteo Bradford did not work that well, she had about 30 % relief for about 3 days, after that the Sx were almost worse.  2. Chronic shoulder pain status post open rotator cuff surgery times 2 on the left and x1 on the right. She's also had a right bicipital tendon tear in the  past. Not improved after left shoulder injection at last visit. Patient is doing exercises for her shoulders and neck, and also posture training.  3. Left sided neck pain, X-ray showed spondylosis of C-spine, with increased muscle tone in her left trapezius .  MRI of C-spine showed moderate to severe spondylosis, with spinal stenosis and foraminal stenosis at multiple levels, also spondylolisthesis :  1. Multilevel cervical spondylolisthesis with advanced disc and  facet degeneration.  2. Subsequent multilevel cervical spinal stenosis with spinal cord  mass effect, maximal at C3-C4. No associated spinal cord signal  abnormality identified.  3. Subsequent  multilevel cervical neural foraminal stenosis,  severe at the bilateral C4, right C5, bilateral C6, bilateral C7,  and to a lesser extent bilateral C8 nerve levels.  4. Chronic degenerative marrow changes in the cervical spine.  Suspect marrow edema at C3-C4 also is degenerative.  Patient had epidural injection into lower C-spine,C7-T1, with 50% relief, had one repeat ESI in her C-spine, which helped also, but not quite as much as the first injection. The last cervical ESI, did not give her much relief. She has been referred to Dr. Danielle Dess by Dr. Marvia Pickles , and had an appointment on 06/02/2013. The patient states, that Dr. Danielle Dess would consider surgery, if her Sx get more severe.Today she is complaining about weakness in both of her arms, more in the right. This has started about 2-3 weeks ago. I advised her to follow up with Dr. Danielle Dess to discuss those new Sx. Asap,which she did, they still decided to wait with surgery at this point. Plan :  Patient should do those exercises we practiced, at home, she also should try to continue with her walking program and continue with exercises to improve her posture, as tolerated.  Follow up in in 6 weeks.

## 2013-09-13 NOTE — Patient Instructions (Signed)
Stay as active as tolerated. 

## 2013-09-20 ENCOUNTER — Ambulatory Visit: Payer: Medicare Other | Admitting: Physical Medicine and Rehabilitation

## 2013-10-21 ENCOUNTER — Encounter: Payer: Self-pay | Admitting: Physical Medicine & Rehabilitation

## 2013-10-21 ENCOUNTER — Encounter: Payer: Medicare Other | Attending: Physical Medicine and Rehabilitation

## 2013-10-21 ENCOUNTER — Ambulatory Visit (HOSPITAL_BASED_OUTPATIENT_CLINIC_OR_DEPARTMENT_OTHER): Payer: Medicare Other | Admitting: Physical Medicine & Rehabilitation

## 2013-10-21 VITALS — BP 135/66 | HR 76 | Resp 14 | Ht 62.0 in | Wt 144.0 lb

## 2013-10-21 DIAGNOSIS — M47817 Spondylosis without myelopathy or radiculopathy, lumbosacral region: Secondary | ICD-10-CM

## 2013-10-21 DIAGNOSIS — G8929 Other chronic pain: Secondary | ICD-10-CM | POA: Insufficient documentation

## 2013-10-21 DIAGNOSIS — M25519 Pain in unspecified shoulder: Secondary | ICD-10-CM | POA: Insufficient documentation

## 2013-10-21 DIAGNOSIS — R209 Unspecified disturbances of skin sensation: Secondary | ICD-10-CM | POA: Insufficient documentation

## 2013-10-21 DIAGNOSIS — M47812 Spondylosis without myelopathy or radiculopathy, cervical region: Secondary | ICD-10-CM | POA: Insufficient documentation

## 2013-10-21 DIAGNOSIS — M12519 Traumatic arthropathy, unspecified shoulder: Secondary | ICD-10-CM

## 2013-10-21 DIAGNOSIS — M47816 Spondylosis without myelopathy or radiculopathy, lumbar region: Secondary | ICD-10-CM

## 2013-10-21 DIAGNOSIS — M12811 Other specific arthropathies, not elsewhere classified, right shoulder: Secondary | ICD-10-CM

## 2013-10-21 DIAGNOSIS — G8928 Other chronic postprocedural pain: Secondary | ICD-10-CM

## 2013-10-21 DIAGNOSIS — M79609 Pain in unspecified limb: Secondary | ICD-10-CM | POA: Insufficient documentation

## 2013-10-21 DIAGNOSIS — M5412 Radiculopathy, cervical region: Secondary | ICD-10-CM

## 2013-10-21 MED ORDER — MEPERIDINE HCL 50 MG PO TABS: 50.0000 mg | ORAL_TABLET | Freq: Two times a day (BID) | ORAL | Status: DC

## 2013-10-21 NOTE — Patient Instructions (Signed)
Reduce demerol to twice a day

## 2013-10-21 NOTE — Progress Notes (Signed)
Subjective:    Patient ID: Jenna [name], female    DOB: Jan 06, 1933, 79 y.o.   MRN: [id number] Bilateral Lumbar L3, L4 medial branch blocks and L 5 dorsal ramus injection under fluoroscopic guidance 07/12/2013 HPI Pain with standing.  Had a delayed improvement with the medial branch blocks but has continued benefit Neck pain and Right thumb numbness Set of 3 cervical epidurals with Dr Roseanna Rainbow Demerol twice a day  Pain Inventory Average Pain 3 Pain Right Now 1 My pain is intermittent and aching  In the last 24 hours, has pain interfered with the following? General activity 3 Relation with others 0 Enjoyment of life 2 What TIME of day is your pain at its worst? day and evening Sleep (in general) Fair  Pain is worse with: walking and standing Pain improves with: rest and medication Relief from Meds: 6  Mobility walk without assistance how many minutes can you walk? 20 ability to climb steps?  yes do you drive?  yes Do you have any goals in this area?  yes  Function retired Do you have any goals in this area?  no  Neuro/Psych weakness numbness  Prior Studies Any changes since last visit?  no MRI of C-spine showed moderate to severe spondylosis, with spinal stenosis and foraminal stenosis at multiple levels, also spondylolisthesis :  1. Multilevel cervical spondylolisthesis with advanced disc and  facet degeneration.  2. Subsequent multilevel cervical spinal stenosis with spinal cord  mass effect, maximal at C3-C4. No associated spinal cord signal  abnormality identified.  3. Subsequent multilevel cervical neural foraminal stenosis,  severe at the bilateral C4, right C5, bilateral C6, bilateral C7,  and to a lesser extent bilateral C8 nerve levels.  4. Chronic degenerative marrow changes in the cervical spine.  Suspect marrow edema at C3-C4 also is degenerative.   Physicians involved in your care Any changes since last visit?  no   Family History    Problem Relation Age of Onset  . Stroke Mother   . Heart attack Father   . Diabetes Brother   . Heart disease Brother    History   Social History  . Marital Status: Married    Spouse Name: N/A    Number of Children: N/A  . Years of Education: N/A   Social History Main Topics  . Smoking status: Former Smoker -- 1.00 packs/day for 15 years    Quit date: 11/03/1973  . Smokeless tobacco: Never Used  . Alcohol Use: 3.0 oz/week    6 drink(s) per week     Comment: occ wine w/ supper  . Drug Use: No  . Sexual Activity: Yes    Birth Control/ Protection: Surgical     Comment: hysterectomy   Other Topics Concern  . None   Social History Narrative  . None   Past Surgical History  Procedure Laterality Date  . Rotator cuff repair Left 1996; 1981  . Cholecystectomy    . Total hip arthroplasty Left 10/2000  . Total hip arthroplasty Right 06/2005  . Rotator cuff repair Right 1996  . Vaginal delivery    . Rotator cuff  Bilateral 1996/1998    repair  . Lipoma excision Left 10/1999    left thigh  . Total hip arthroplasty Left     10/2000  . Lung biopsy  05/2001  . Lumbosacral cyst  2005  . Appendectomy  1986    along with chole  . Breast biopsy Right 1997  sclerosing adenosis -Dr. Ezzard Standing  . Abdominal hysterectomy  1982    BSO   Past Medical History  Diagnosis Date  . Lumbosacral spondylosis without myelopathy   . Pain in joint, upper arm   . Other chronic postoperative pain   . Spinal stenosis, lumbar region, without neurogenic claudication   . Osteoarthritis   . Melanoma     on left leg  . Thyroid disease     hypothyroidism  . Hypercholesteremia   . Cervical spinal stenosis   . Hypertension   . Sarcoidosis of lung 05/2001    resolved within a year   BP 135/66  Pulse 76  Resp 14  Ht 5\' 2"  (1.575 m)  Wt 144 lb (65.318 kg)  BMI 26.33 kg/m2  SpO2 97%     Review of Systems  Musculoskeletal: Positive for neck pain.  Neurological: Positive for weakness and  numbness.  All other systems reviewed and are negative.       Objective:   Physical Exam  Nursing note and vitals reviewed. Constitutional: She is oriented to person, place, and time. She appears well-developed and well-nourished.  HENT:  Head: Normocephalic and atraumatic.  Eyes: Conjunctivae and EOM are normal. Pupils are equal, round, and reactive to light.  Neurological: She is alert and oriented to person, place, and time.  Psychiatric: She has a normal mood and affect.   Decreased C6 sensation on the right side only. Her strength is 5 over 5 Bi lateral biceps triceps grip as well as arm external rotators as well as bilateral lower extremities. Deltoids are limited with range of motion secondary to glenohumeral arthritic changes and rotator cuff tears.         Assessment & Plan:  1. Chronic lumbar pain lumbar spondylosis improved after medial branch blocks. We'll continue Demerol but reduced to twice a day with eventual goal of discontinuing 2. Chronic bilateral shoulder pain secondary to rotator cuff arthropathy 3. Cervical stenosis causing right C6 radiculopathy. Also may be contributing to shoulder pain as well  Overall seems to be improving but still a significant issue next month would start gabapentin

## 2013-10-25 ENCOUNTER — Ambulatory Visit: Payer: Medicare Other

## 2013-11-29 ENCOUNTER — Ambulatory Visit: Payer: Medicare Other | Admitting: Physical Medicine & Rehabilitation

## 2013-12-01 ENCOUNTER — Ambulatory Visit (HOSPITAL_BASED_OUTPATIENT_CLINIC_OR_DEPARTMENT_OTHER): Payer: Medicare Other | Admitting: Physical Medicine & Rehabilitation

## 2013-12-01 ENCOUNTER — Encounter: Payer: Self-pay | Admitting: Physical Medicine & Rehabilitation

## 2013-12-01 ENCOUNTER — Encounter: Payer: Medicare Other | Attending: Physical Medicine and Rehabilitation

## 2013-12-01 VITALS — BP 138/79 | HR 67 | Resp 14 | Ht 62.0 in | Wt 148.0 lb

## 2013-12-01 DIAGNOSIS — M47812 Spondylosis without myelopathy or radiculopathy, cervical region: Secondary | ICD-10-CM

## 2013-12-01 DIAGNOSIS — M47816 Spondylosis without myelopathy or radiculopathy, lumbar region: Secondary | ICD-10-CM

## 2013-12-01 DIAGNOSIS — M12811 Other specific arthropathies, not elsewhere classified, right shoulder: Secondary | ICD-10-CM

## 2013-12-01 DIAGNOSIS — M75101 Unspecified rotator cuff tear or rupture of right shoulder, not specified as traumatic: Secondary | ICD-10-CM

## 2013-12-01 DIAGNOSIS — M47817 Spondylosis without myelopathy or radiculopathy, lumbosacral region: Secondary | ICD-10-CM | POA: Insufficient documentation

## 2013-12-01 DIAGNOSIS — M19019 Primary osteoarthritis, unspecified shoulder: Secondary | ICD-10-CM

## 2013-12-01 DIAGNOSIS — G8929 Other chronic pain: Secondary | ICD-10-CM | POA: Insufficient documentation

## 2013-12-01 DIAGNOSIS — M25519 Pain in unspecified shoulder: Secondary | ICD-10-CM | POA: Insufficient documentation

## 2013-12-01 DIAGNOSIS — M12812 Other specific arthropathies, not elsewhere classified, left shoulder: Secondary | ICD-10-CM

## 2013-12-01 DIAGNOSIS — R209 Unspecified disturbances of skin sensation: Secondary | ICD-10-CM | POA: Insufficient documentation

## 2013-12-01 DIAGNOSIS — M75102 Unspecified rotator cuff tear or rupture of left shoulder, not specified as traumatic: Secondary | ICD-10-CM

## 2013-12-01 DIAGNOSIS — M79609 Pain in unspecified limb: Secondary | ICD-10-CM | POA: Insufficient documentation

## 2013-12-01 MED ORDER — MEPERIDINE HCL 50 MG PO TABS: 50.0000 mg | ORAL_TABLET | Freq: Two times a day (BID) | ORAL | Status: DC

## 2013-12-01 NOTE — Progress Notes (Signed)
Subjective:    Patient ID: Jenna [name], female    DOB: [birth year], 80 y.o.   MRN: [id number]  HPI Bilateral Lumbar L3, L4 medial branch blocks and L 5 dorsal ramus injection under fluoroscopic guidance  07/12/2013 Starting to feel achy again Chronic left hip pain.  calcium deposit noted on the previous x-ray at orthopedic surgeons office, has been able to sleep on her back or on the right hip instead  Sees neurosurgery for cervical stenosis Pain Inventory Average Pain 3 Pain Right Now 3 My pain is intermittent and aching  In the last 24 hours, has pain interfered with the following? General activity 2 Relation with others 0 Enjoyment of life 3 What TIME of day is your pain at its worst? day and evening  Sleep (in general) Good  Pain is worse with: standing and some activites Pain improves with: rest, medication and injections Relief from Meds: 7  Mobility walk without assistance how many minutes can you walk? 20 ability to climb steps?  yes do you drive?  yes Do you have any goals in this area?  yes  Function retired Do you have any goals in this area?  no  Neuro/Psych weakness numbness tingling  Prior Studies Any changes since last visit?  no  Physicians involved in your care Any changes since last visit?  no   Family History  Problem Relation Age of Onset  . Stroke Mother   . Heart attack Father   . Diabetes Brother   . Heart disease Brother    History   Social History  . Marital Status: Married    Spouse Name: N/A    Number of Children: N/A  . Years of Education: N/A   Social History Main Topics  . Smoking status: Former Smoker -- 1.00 packs/day for 15 years    Quit date: 11/03/1973  . Smokeless tobacco: Never Used  . Alcohol Use: 3.0 oz/week    6 drink(s) per week     Comment: occ wine w/ supper  . Drug Use: No  . Sexual Activity: Yes    Birth Control/ Protection: Surgical     Comment: hysterectomy   Other Topics Concern  . None     Social History Narrative  . None   Past Surgical History  Procedure Laterality Date  . Rotator cuff repair Left 1996; 1981  . Cholecystectomy    . Total hip arthroplasty Left 10/2000  . Total hip arthroplasty Right 06/2005  . Rotator cuff repair Right 1996  . Vaginal delivery    . Rotator cuff  Bilateral 1996/1998    repair  . Lipoma excision Left 10/1999    left thigh  . Total hip arthroplasty Left     10/2000  . Lung biopsy  05/2001  . Lumbosacral cyst  2005  . Appendectomy  1986    along with chole  . Breast biopsy Right 1997    sclerosing adenosis -Dr. Lucia Gaskins  . Abdominal hysterectomy  1982    BSO   Past Medical History  Diagnosis Date  . Lumbosacral spondylosis without myelopathy   . Pain in joint, upper arm   . Other chronic postoperative pain   . Spinal stenosis, lumbar region, without neurogenic claudication   . Osteoarthritis   . Melanoma     on left leg  . Thyroid disease     hypothyroidism  . Hypercholesteremia   . Cervical spinal stenosis   . Hypertension   . Sarcoidosis of lung 05/2001  resolved within a year   BP 138/79  Pulse 67  Resp 14  Ht 5\' 2"  (1.575 m)  Wt 148 lb (67.132 kg)  BMI 27.06 kg/m2  SpO2 98%  Opioid Risk Score:   Fall Risk Score: Moderate Fall Risk (6-13 points) (patient educated/handout declined)   Review of Systems  Musculoskeletal: Positive for back pain.  Neurological: Positive for weakness and numbness.  All other systems reviewed and are negative.       Objective:   Physical Exam  Nursing note and vitals reviewed. Constitutional: She is oriented to person, place, and time. She appears well-developed and well-nourished.  Musculoskeletal:  Pain with extension lumbar  No pain to palpation lumbar  Neurological: She is alert and oriented to person, place, and time. She has normal strength.  Psychiatric: She has a normal mood and affect.          Assessment & Plan:  1. Lumbar spondylosis with chronic low  back pain. Starting to have recurrent symptoms. Last lumbar injection in September 2014. Will schedule for another injection in one month but if pain is stable compared to this visit may not needed quite yet  Rec:  Walking 52min per day  Will continue Demerol 50 mg twice a day. Was on TID dosing Overall goal would be to reduce this medication. We discussed other alternatives including Butrans patch  2. Cervical stenosis will followup with neurosurgeon on this. Has some numbness in the thumbs no balance problems.  Instructed pt to contact NS if increased weakness of arms or legs or if balance problems occur.

## 2013-12-01 NOTE — Patient Instructions (Signed)
Please call neurosurgery for a early appointment should the arms and hands get increasingly weak

## 2014-01-04 ENCOUNTER — Other Ambulatory Visit (HOSPITAL_COMMUNITY): Payer: Self-pay | Admitting: Gastroenterology

## 2014-01-04 DIAGNOSIS — R1310 Dysphagia, unspecified: Secondary | ICD-10-CM

## 2014-01-05 ENCOUNTER — Encounter: Payer: Medicare Other | Attending: Physical Medicine and Rehabilitation

## 2014-01-05 ENCOUNTER — Ambulatory Visit: Payer: Medicare Other | Admitting: Physical Medicine & Rehabilitation

## 2014-01-05 DIAGNOSIS — M47817 Spondylosis without myelopathy or radiculopathy, lumbosacral region: Secondary | ICD-10-CM | POA: Insufficient documentation

## 2014-01-05 DIAGNOSIS — M79609 Pain in unspecified limb: Secondary | ICD-10-CM | POA: Insufficient documentation

## 2014-01-05 DIAGNOSIS — M25519 Pain in unspecified shoulder: Secondary | ICD-10-CM | POA: Insufficient documentation

## 2014-01-05 DIAGNOSIS — M47812 Spondylosis without myelopathy or radiculopathy, cervical region: Secondary | ICD-10-CM | POA: Insufficient documentation

## 2014-01-05 DIAGNOSIS — R209 Unspecified disturbances of skin sensation: Secondary | ICD-10-CM | POA: Insufficient documentation

## 2014-01-05 DIAGNOSIS — G8929 Other chronic pain: Secondary | ICD-10-CM | POA: Insufficient documentation

## 2014-01-09 ENCOUNTER — Ambulatory Visit (HOSPITAL_COMMUNITY)
Admission: RE | Admit: 2014-01-09 | Discharge: 2014-01-09 | Disposition: A | Discharge status: Home / Self Care | Payer: Medicare Other | Source: Ambulatory Visit | Attending: Gastroenterology | Admitting: Gastroenterology

## 2014-01-09 DIAGNOSIS — R1310 Dysphagia, unspecified: Secondary | ICD-10-CM | POA: Insufficient documentation

## 2014-01-09 NOTE — Procedures (Addendum)
Objective Swallowing Evaluation: Modified Barium Swallowing Study  Patient Details  Name: Jenna [name] MRN: [id number] Date of Birth: 06-18-1933  Today's Date: 01/09/2014 Time: 8416-6063 SLP Time Calculation (min): 25 min  Past Medical History:  Past Medical History  Diagnosis Date  . Lumbosacral spondylosis without myelopathy   . Pain in joint, upper arm   . Other chronic postoperative pain   . Spinal stenosis, lumbar region, without neurogenic claudication   . Osteoarthritis   . Melanoma     on left leg  . Thyroid disease     hypothyroidism  . Hypercholesteremia   . Cervical spinal stenosis   . Hypertension   . Sarcoidosis of lung 05/2001    resolved within a year   Past Surgical History:  Past Surgical History  Procedure Laterality Date  . Rotator cuff repair Left 1996; 1981  . Cholecystectomy    . Total hip arthroplasty Left 10/2000  . Total hip arthroplasty Right 06/2005  . Rotator cuff repair Right 1996  . Vaginal delivery    . Rotator cuff  Bilateral 1996/1998    repair  . Lipoma excision Left 10/1999    left thigh  . Total hip arthroplasty Left     10/2000  . Lung biopsy  05/2001  . Lumbosacral cyst  2005  . Appendectomy  1986    along with chole  . Breast biopsy Right 1997    sclerosing adenosis -Dr. Lucia Gaskins  . Abdominal hysterectomy  1982    BSO   HPI:  80 yo female referred by GI MD for MBS due to pt complaint of choking sensation with "thin liquids" sensing it "go down the wrong way" causing her "airway to close" per pt.  Pt report occurrence x1 year with worsening recently.  She reports choking episode where she became "foggy" and was worried she may pass out.  Pt PMH + for arthritis, spinal stenosis cervical with cord compression, (right arm and hand weakness, numbness right hand), GERD, hiatal hernia, esophageal stricture requiring stretching- per pt stretching was a "long time ago".  Medication list inclues several including Prilosec, Flonase,  Phenergan.   Cervical spine imaging:  Subsequent multilevel cervical spinal stenosis with spinal cord mass effect, maximal at C3-C4. No associated spinal cord signal abnormality identified,   Subsequent multilevel cervical neural foraminal stenosis, severe at the bilateral C4, right C5, bilateral C6, bilateral C7, and to a lesser extent bilateral C8 nerve levels, Chronic degenerative marrow changes in the cervical spine, Suspect marrow edema at C3-C4 also is degenerative.     Assessment / Plan / Recommendation Clinical Impression  Clinical impression: Pt with functional oropharyngeal swallow without aspiration or laryngeal penetration nor significant stasis.  Pt appeared with minimal stasis x2 with pudding and nectar liquids below UES that effectively cleared with cued dry swallows.    Pt did not demonstrate choking sensation during testing.  Question if pt's symptoms may be consistent with spasm *? laryngo vs cricopharyngeus.  Educated pt to findings and reviewed possible measures to use to dissipate her "choking" sensation when it occurs.  Pt does report choking is worse with water/tea and less with thicker or warm drinks.    Pt did appear with cervical spine issues as seen on previous imaging, ? If this may contribute to her dysphagia symptoms.    Pt with referrant sensation of stasis to pharynx when actually appeared in distal esophagus x1.    Thanks for this referral.      Treatment Recommendation  No treatment recommended at this time    Diet Recommendation Regular;Thin liquid   Liquid Administration via: Straw;Cup Medication Administration: Whole meds with liquid Supervision: Patient able to self feed Compensations: Slow rate;Small sips/bites (intermittent dry swallow, consider consumption of warm or room temp liquids) Postural Changes and/or Swallow Maneuvers: Seated upright 90 degrees;Upright 30-60 min after meal    Other  Recommendations Oral Care Recommendations: Oral care BID    Follow Up Recommendations    n/a        General Date of Onset: 01/09/14 HPI: 80 yo female referred by GI MD for MBS due to pt complaint of choking sensation with "thin liquids" sensing it "go down the wrong way" causing her "airway to close" per pt.  Pt report occurrence x1 year with worsening recently.  She reports choking episode where she became "foggy" and was worried she may pass out.  Pt PMH + for arthritis, spinal stenosis cervical with cord compression, (right arm and hand weakness, numbness right hand), GERD, hiatal hernia, esophageal stricture requiring stretching- per pt stretching was a "long time ago".  Medication list inclues several including Prilosec, Flonase, Phenergan.   Cervical spine imaging:  Subsequent multilevel cervical spinal stenosis with spinal cord mass effect, maximal at C3-C4. No associated spinal cord signal abnormality identified,   Subsequent multilevel cervical neural foraminal stenosis, severe at the bilateral C4, right C5, bilateral C6, bilateral C7, and to a lesser extent bilateral C8 nerve levels, Chronic degenerative marrow changes in the cervical spine, Suspect marrow edema at C3-C4 also is degenerative. Type of Study: Modified Barium Swallowing Study Reason for Referral: Objectively evaluate swallowing function Previous Swallow Assessment: esophagram WNL 04/2013 Diet Prior to this Study: Regular;Thin liquids Temperature Spikes Noted: No Respiratory Status: Room air Behavior/Cognition: Alert;Cooperative;Pleasant mood Oral Cavity - Dentition: Adequate natural dentition Oral Motor / Sensory Function: Impaired - see Bedside swallow eval Self-Feeding Abilities: Able to feed self Patient Positioning: Upright in chair Baseline Vocal Quality: Clear Volitional Cough: Strong Volitional Swallow: Able to elicit Anatomy: Within functional limits    Reason for Referral Objectively evaluate swallowing function   Oral Phase Oral Preparation/Oral Phase Oral Phase:  WFL Oral - Nectar Oral - Nectar Cup: Within functional limits Oral - Thin Oral - Thin Cup: Within functional limits Oral - Thin Straw: Within functional limits Oral - Solids Oral - Puree: Within functional limits Oral - Regular: Within functional limits Oral - Pill: Within functional limits   Pharyngeal Phase Pharyngeal Phase Pharyngeal Phase: Within functional limits Pharyngeal - Nectar Pharyngeal - Nectar Cup: Within functional limits Pharyngeal - Thin Pharyngeal - Thin Cup: Within functional limits Pharyngeal - Thin Straw: Within functional limits Pharyngeal - Solids Pharyngeal - Puree: Within functional limits Pharyngeal - Regular: Within functional limits Pharyngeal - Pill: Within functional limits  Cervical Esophageal Phase    GO    Cervical Esophageal Phase Cervical Esophageal Phase: Impaired Cervical Esophageal Phase - Nectar Nectar Cup: Prominent cricopharyngeal segment Cervical Esophageal Phase - Thin Thin Cup: Prominent cricopharyngeal segment Thin Straw: Prominent cricopharyngeal segment Cervical Esophageal Phase - Solids Puree: Prominent cricopharyngeal segment Regular: Prominent cricopharyngeal segment Pill: Prominent cricopharyngeal segment Cervical Esophageal Phase - Comment Cervical Esophageal Comment: Appearance of trace stasis below UES x2 with pudding and nectar thick liquid - cleared adequately with dry swallow, barium tablet taken with thin liquid appeared to easily clear through esophagus.      Functional Assessment Tool Used: MBS, clinical judgement Functional Limitations: Swallowing Swallow Current Status ([id number]): At least 1 percent but  less than 20 percent impaired, limited or restricted Swallow Goal Status [phone]): At least 1 percent but less than 20 percent impaired, limited or restricted Swallow Discharge Status [phone]): At least 1 percent but less than 20 percent impaired, limited or restricted    Luanna Salk, [facility]  SLP [phone]

## 2014-01-16 ENCOUNTER — Telehealth: Payer: Self-pay

## 2014-01-16 MED ORDER — PROMETHAZINE HCL 25 MG PO TABS: ORAL_TABLET | ORAL | Status: DC

## 2014-01-16 MED ORDER — MEPERIDINE HCL 50 MG PO TABS: 50.0000 mg | ORAL_TABLET | Freq: Two times a day (BID) | ORAL | Status: DC

## 2014-01-16 NOTE — Telephone Encounter (Signed)
Patient is requesting a refill on Demerol and phenergan. Phenergan escribed to pharmacy. Demerol RX printed for Dr. Letta Pate to sign. Will contact patient when ready for pick up.

## 2014-01-16 NOTE — Telephone Encounter (Signed)
Patient aware her demerol rx is ready for pick up.

## 2014-01-23 ENCOUNTER — Other Ambulatory Visit: Payer: Self-pay | Admitting: Physical Medicine & Rehabilitation

## 2014-02-07 ENCOUNTER — Ambulatory Visit (HOSPITAL_BASED_OUTPATIENT_CLINIC_OR_DEPARTMENT_OTHER): Payer: Medicare Other | Admitting: Physical Medicine & Rehabilitation

## 2014-02-07 ENCOUNTER — Encounter: Payer: Medicare Other | Attending: Physical Medicine and Rehabilitation

## 2014-02-07 ENCOUNTER — Encounter: Payer: Self-pay | Admitting: Physical Medicine & Rehabilitation

## 2014-02-07 VITALS — BP 138/50 | HR 64 | Resp 14 | Ht 62.0 in | Wt 148.0 lb

## 2014-02-07 DIAGNOSIS — M47817 Spondylosis without myelopathy or radiculopathy, lumbosacral region: Secondary | ICD-10-CM | POA: Insufficient documentation

## 2014-02-07 DIAGNOSIS — M47812 Spondylosis without myelopathy or radiculopathy, cervical region: Secondary | ICD-10-CM | POA: Insufficient documentation

## 2014-02-07 DIAGNOSIS — G8929 Other chronic pain: Secondary | ICD-10-CM | POA: Insufficient documentation

## 2014-02-07 DIAGNOSIS — R209 Unspecified disturbances of skin sensation: Secondary | ICD-10-CM | POA: Insufficient documentation

## 2014-02-07 DIAGNOSIS — M47816 Spondylosis without myelopathy or radiculopathy, lumbar region: Secondary | ICD-10-CM

## 2014-02-07 DIAGNOSIS — M25519 Pain in unspecified shoulder: Secondary | ICD-10-CM | POA: Insufficient documentation

## 2014-02-07 DIAGNOSIS — M79609 Pain in unspecified limb: Secondary | ICD-10-CM | POA: Insufficient documentation

## 2014-02-07 MED ORDER — MEPERIDINE HCL 50 MG PO TABS: 50.0000 mg | ORAL_TABLET | Freq: Two times a day (BID) | ORAL | Status: DC

## 2014-02-07 NOTE — Patient Instructions (Signed)

## 2014-02-07 NOTE — Progress Notes (Signed)
  PROCEDURE RECORD [facility]   Name: Jenna [name] DOB:[birth year] MRN: [id number]  Date:02/07/2014  Physician: Alysia Penna, MD    Nurse/CMA: Redgie Grayer  Allergies:  Allergies  Allergen Reactions  . Codeine Shortness Of Breath  . Darvon Shortness Of Breath  . Tylenol [Acetaminophen] Shortness Of Breath  . Ultram [Tramadol] Shortness Of Breath  . Vicodin [Hydrocodone-Acetaminophen] Shortness Of Breath  . Dalmane [Flurazepam Hcl] Other (See Comments)    confusion  . Cephalexin Nausea Only  . Clarithromycin Nausea Only  . Doxycycline Calcium Rash    Consent Signed: yes  Is patient diabetic? no    Pregnant: no LMP: No LMP recorded. Patient has had a hysterectomy. (age 39-55)  Anticoagulants: no Anti-inflammatory: no Antibiotics: no  Procedure: Medial Branch Block  Position: Prone Start Time:  1111 End Time: 1124  Fluoro Time: 37  RN/CMA Levens,CMA Levens,CMA    Time 1050 1128    BP 138/50 132/72    Pulse 64 68    Respirations 14 14    O2 Sat 99 85    S/S 6 6    Pain Level 3/10 0/10     D/C home with Husband Bill, [name], D/C instructions reviewed, and sits independently.

## 2014-02-07 NOTE — Progress Notes (Signed)
Bilateral back pain right greater than left. Some radiation toward the hip. Last medial branch blocks were very helpful on the right side performed in 07/12/2013. Pain has been increasing once again and now affecting left side as well  Bilateral Lumbar L3, L4  medial branch blocks and L 5 dorsal ramus injection under fluoroscopic guidance  Indication: Lumbar pain which is not relieved by medication management or other conservative care and interfering with self-care and mobility.  Informed consent was obtained after describing risks and benefits of the procedure with the patient, this includes bleeding, infection, paralysis and medication side effects.  The patient wishes to proceed and has given written consent.  The patient was placed in prone position.  The lumbar area was marked and prepped with Betadine.  One mL of 1% lidocaine was injected into each of 6 areas into the skin and subcutaneous tissue.  Then a 22-gauge 3.5 spinal needle was inserted targeting the junction of the left S1 superior articular process and sacral ala junction. Needle was advanced under fluoroscopic guidance.  Bone contact was made.  Omnipaque 180 was injected x 0.5 mL demonstrating no intravascular uptake.  Then a solution containing one mL of 4 mg per mL dexamethasone and 3 mL of 2% MPF lidocaine was injected x 0.5 mL.  Then the left L5 superior articular process in transverse process junction was targeted.  Bone contact was made.  Omnipaque 180 was injected x 0.5 mL demonstrating no intravascular uptake. Then a solution containing one mL of 4 mg per mL dexamethasone and 3 mL of 2% MPF lidocaine was injected x 0.5 mL.  Then the left L4 superior articular process in transverse process junction was targeted.  Bone contact was made.  Omnipaque 180 was injected x 0.5 mL demonstrating no intravascular uptake.  Then a solution containing one mL of 4 mg per mL dexamethasone and 3 mL if 2% MPF lidocaine was injected x 0.5 mL.  This same  procedure was performed on the right side using the same needle, technique and injectate.  Patient tolerated procedure well.  Post procedure instructions were given.

## 2014-03-21 ENCOUNTER — Ambulatory Visit (HOSPITAL_BASED_OUTPATIENT_CLINIC_OR_DEPARTMENT_OTHER): Payer: Medicare Other | Admitting: Physical Medicine & Rehabilitation

## 2014-03-21 ENCOUNTER — Encounter: Payer: Self-pay | Admitting: Physical Medicine & Rehabilitation

## 2014-03-21 ENCOUNTER — Encounter: Payer: Medicare Other | Attending: Physical Medicine and Rehabilitation

## 2014-03-21 VITALS — BP 152/79 | HR 90 | Resp 14 | Ht 62.5 in | Wt 150.0 lb

## 2014-03-21 DIAGNOSIS — M79609 Pain in unspecified limb: Secondary | ICD-10-CM | POA: Insufficient documentation

## 2014-03-21 DIAGNOSIS — M47817 Spondylosis without myelopathy or radiculopathy, lumbosacral region: Secondary | ICD-10-CM

## 2014-03-21 DIAGNOSIS — M47812 Spondylosis without myelopathy or radiculopathy, cervical region: Secondary | ICD-10-CM | POA: Insufficient documentation

## 2014-03-21 DIAGNOSIS — M48061 Spinal stenosis, lumbar region without neurogenic claudication: Secondary | ICD-10-CM

## 2014-03-21 DIAGNOSIS — R209 Unspecified disturbances of skin sensation: Secondary | ICD-10-CM | POA: Insufficient documentation

## 2014-03-21 DIAGNOSIS — M19019 Primary osteoarthritis, unspecified shoulder: Secondary | ICD-10-CM

## 2014-03-21 DIAGNOSIS — M12811 Other specific arthropathies, not elsewhere classified, right shoulder: Secondary | ICD-10-CM

## 2014-03-21 DIAGNOSIS — G8928 Other chronic postprocedural pain: Secondary | ICD-10-CM

## 2014-03-21 DIAGNOSIS — M75101 Unspecified rotator cuff tear or rupture of right shoulder, not specified as traumatic: Principal | ICD-10-CM

## 2014-03-21 DIAGNOSIS — M75102 Unspecified rotator cuff tear or rupture of left shoulder, not specified as traumatic: Principal | ICD-10-CM

## 2014-03-21 DIAGNOSIS — M25519 Pain in unspecified shoulder: Secondary | ICD-10-CM | POA: Insufficient documentation

## 2014-03-21 DIAGNOSIS — G8929 Other chronic pain: Secondary | ICD-10-CM | POA: Insufficient documentation

## 2014-03-21 DIAGNOSIS — M47816 Spondylosis without myelopathy or radiculopathy, lumbar region: Secondary | ICD-10-CM

## 2014-03-21 DIAGNOSIS — M12812 Other specific arthropathies, not elsewhere classified, left shoulder: Principal | ICD-10-CM

## 2014-03-21 MED ORDER — MEPERIDINE HCL 50 MG PO TABS: 50.0000 mg | ORAL_TABLET | Freq: Two times a day (BID) | ORAL | Status: DC

## 2014-03-21 NOTE — Progress Notes (Signed)
Subjective:    Patient ID: Jenna [name], female    DOB: 07/23/1933, 80 y.o.   MRN: [id number]  HPI Bilateral L3-L4 medial branch blocks in bilateral L5 dorsal ramus injection performed 02/07/2014. Patient had relief immediately after the procedure but this was not prolonged at all. Has had previous good relief that lasted several months after injections performed September 2014 as well as February 2014. Patient does not feel like the pain has shifted to a new location. No numbness tingling or shooting pains into the feet. Reviewed last MRI from 2011 which showed facet arthropathy at L4-5 and L5-S1 levels. Pain Inventory Average Pain 5 Pain Right Now 3 My pain is intermittent, sharp and aching  In the last 24 hours, has pain interfered with the following? General activity 4 Relation with others 4 Enjoyment of life 4 What TIME of day is your pain at its worst? day and evening Sleep (in general) Fair  Pain is worse with: walking, standing and some activites Pain improves with: rest, pacing activities and medication Relief from Meds: 4  Mobility walk without assistance how many minutes can you walk? 10-15 ability to climb steps?  yes do you drive?  yes Do you have any goals in this area?  yes  Function retired Do you have any goals in this area?  no  Neuro/Psych weakness numbness trouble walking  Prior Studies Any changes since last visit?  no  Physicians involved in your care Any changes since last visit?  no   Family History  Problem Relation Age of Onset  . Stroke Mother   . Heart attack Father   . Diabetes Brother   . Heart disease Brother    History   Social History  . Marital Status: Married    Spouse Name: N/A    Number of Children: N/A  . Years of Education: N/A   Social History Main Topics  . Smoking status: Former Smoker -- 1.00 packs/day for 15 years    Quit date: 11/03/1973  . Smokeless tobacco: Never Used  . Alcohol Use: 3.0 oz/week   6 drink(s) per week     Comment: occ wine w/ supper  . Drug Use: No  . Sexual Activity: Yes    Birth Control/ Protection: Surgical     Comment: hysterectomy   Other Topics Concern  . None   Social History Narrative  . None   Past Surgical History  Procedure Laterality Date  . Rotator cuff repair Left 1996; 1981  . Cholecystectomy    . Total hip arthroplasty Left 10/2000  . Total hip arthroplasty Right 06/2005  . Rotator cuff repair Right 1996  . Vaginal delivery    . Rotator cuff  Bilateral 1996/1998    repair  . Lipoma excision Left 10/1999    left thigh  . Total hip arthroplasty Left     10/2000  . Lung biopsy  05/2001  . Lumbosacral cyst  2005  . Appendectomy  1986    along with chole  . Breast biopsy Right 1997    sclerosing adenosis -Dr. Lucia Gaskins  . Abdominal hysterectomy  1982    BSO   Past Medical History  Diagnosis Date  . Lumbosacral spondylosis without myelopathy   . Pain in joint, upper arm   . Other chronic postoperative pain   . Spinal stenosis, lumbar region, without neurogenic claudication   . Osteoarthritis   . Melanoma     on left leg  . Thyroid disease  hypothyroidism  . Hypercholesteremia   . Cervical spinal stenosis   . Hypertension   . 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  Neurological: Positive for weakness and numbness.  All other systems reviewed and are negative.      Objective:   Physical Exam  Normal lumbar flexion Limited lumbar extension to 25% of normal limited lateral bending to 50% of normal Hip range of motion is normal Negative straight leg raising Normal strength in both lower  extremities Normal sensation to pinprick in both lower extremities Mood and affect are appropriate General no acute distress  Nursing notes and vital signs reviewed  Ambulates without evidence of foot drop      Assessment & Plan:  1.  lumbar spondylosis chronic low back pain. She has had good relief with medial branch blocks L3-4 as well as L5 dorsal ramus injection done on multiple occasions with results lasting 6 months or more at times. Her last set of injections was performed 02/07/2014 which gave a shorter term relief hours rather than months. We discussed that she still has justification for radiofrequency ablation of the same levels. She does have facet joint arthritis at corresponding levels noted on MRI as well as on fluoroscopy imaging. She would like to think about this. She would like to try another set of medial branch blocks  We also discussed her Demerol. This was reduced to twice a day several months ago since she was doing quite well with her pain however her pain has crept up over time. If the shoulder injection is not particularly helpful for her pain consider going back up to 3 times per day.  2.  Left postoperative shoulder pain history of osteoarthritis. Did not do well with a palpation guided shoulder injection at the orthopedic office. Has had previous relief with left ultrasound guided shoulder injection. Will do this today  Ultrasound guided left shoulder joint injection Informed consent was obtained after describing risks and benefits of the procedure with the patient these include bleeding bruising and infection she elects to proceed and has given written consent. Patient placed in a supine position and then rotated slightly  Into a right lateral decubitus position with the arm in internal rotation. Linear transducer utilized. Humerus and acromion identified. 25-gauge inch needle was inserted into the joint space. Images saved. 1 cc of 40 November cc Depo-Medrol 4 cc  1% lidocaine injected. Patient tolerated procedure well.

## 2014-03-21 NOTE — Patient Instructions (Signed)

## 2014-04-07 ENCOUNTER — Encounter: Payer: Self-pay | Admitting: Physical Medicine & Rehabilitation

## 2014-04-19 ENCOUNTER — Other Ambulatory Visit: Payer: Self-pay

## 2014-04-19 ENCOUNTER — Encounter: Payer: Medicare Other | Admitting: Registered Nurse

## 2014-04-19 MED ORDER — MEPERIDINE HCL 50 MG PO TABS: 50.0000 mg | ORAL_TABLET | Freq: Two times a day (BID) | ORAL | Status: DC

## 2014-04-19 NOTE — Telephone Encounter (Signed)
Per Johnette printed Demerol RX for patient to pickup due to air being out in clinic. Patient scheduled to see Dr. Letta Pate in July.

## 2014-05-16 ENCOUNTER — Ambulatory Visit (HOSPITAL_BASED_OUTPATIENT_CLINIC_OR_DEPARTMENT_OTHER): Payer: Medicare Other | Admitting: Physical Medicine & Rehabilitation

## 2014-05-16 ENCOUNTER — Encounter: Payer: Medicare Other | Attending: Physical Medicine and Rehabilitation

## 2014-05-16 ENCOUNTER — Encounter: Payer: Self-pay | Admitting: Physical Medicine & Rehabilitation

## 2014-05-16 VITALS — BP 143/55 | HR 83 | Resp 16 | Ht 62.0 in | Wt 146.0 lb

## 2014-05-16 DIAGNOSIS — M79609 Pain in unspecified limb: Secondary | ICD-10-CM | POA: Diagnosis not present

## 2014-05-16 DIAGNOSIS — M75102 Unspecified rotator cuff tear or rupture of left shoulder, not specified as traumatic: Secondary | ICD-10-CM

## 2014-05-16 DIAGNOSIS — M47816 Spondylosis without myelopathy or radiculopathy, lumbar region: Secondary | ICD-10-CM

## 2014-05-16 DIAGNOSIS — M19019 Primary osteoarthritis, unspecified shoulder: Secondary | ICD-10-CM

## 2014-05-16 DIAGNOSIS — M47812 Spondylosis without myelopathy or radiculopathy, cervical region: Secondary | ICD-10-CM | POA: Diagnosis not present

## 2014-05-16 DIAGNOSIS — M75101 Unspecified rotator cuff tear or rupture of right shoulder, not specified as traumatic: Secondary | ICD-10-CM

## 2014-05-16 DIAGNOSIS — G8928 Other chronic postprocedural pain: Secondary | ICD-10-CM

## 2014-05-16 DIAGNOSIS — R209 Unspecified disturbances of skin sensation: Secondary | ICD-10-CM | POA: Diagnosis not present

## 2014-05-16 DIAGNOSIS — M47817 Spondylosis without myelopathy or radiculopathy, lumbosacral region: Secondary | ICD-10-CM | POA: Insufficient documentation

## 2014-05-16 DIAGNOSIS — M12811 Other specific arthropathies, not elsewhere classified, right shoulder: Secondary | ICD-10-CM

## 2014-05-16 DIAGNOSIS — M12812 Other specific arthropathies, not elsewhere classified, left shoulder: Secondary | ICD-10-CM

## 2014-05-16 DIAGNOSIS — G8929 Other chronic pain: Secondary | ICD-10-CM | POA: Insufficient documentation

## 2014-05-16 DIAGNOSIS — M25519 Pain in unspecified shoulder: Secondary | ICD-10-CM | POA: Insufficient documentation

## 2014-05-16 MED ORDER — MEPERIDINE HCL 50 MG PO TABS: 50.0000 mg | ORAL_TABLET | Freq: Two times a day (BID) | ORAL | Status: DC

## 2014-05-16 NOTE — Progress Notes (Signed)
Subjective:    Patient ID: Jenna [name], female    DOB: [birth year], 80 y.o.   MRN: [id number]  HPI Left shoulder pain better since injection  Pain in back ok Arm and thumb/ index finger numbness Sees Dr Ellene Route for this Pain Inventory Average Pain 3 Pain Right Now 2 My pain is intermittent and aching  In the last 24 hours, has pain interfered with the following? General activity 3 Relation with others 1 Enjoyment of life 2 What TIME of day is your pain at its worst? daytime, evening Sleep (in general) Fair  Pain is worse with: standing and some activites Pain improves with: rest, pacing activities and medication Relief from Meds: 5  Mobility walk without assistance how many minutes can you walk? 10-15 ability to climb steps?  yes do you drive?  yes transfers alone Do you have any goals in this area?  yes  Function retired Do you have any goals in this area?  no  Neuro/Psych weakness numbness  Prior Studies Any changes since last visit?  no  Physicians involved in your care Any changes since last visit?  no   Family History  Problem Relation Age of Onset  . Stroke Mother   . Heart attack Father   . Diabetes Brother   . Heart disease Brother    History   Social History  . Marital Status: Married    Spouse Name: N/A    Number of Children: N/A  . Years of Education: N/A   Social History Main Topics  . Smoking status: Former Smoker -- 1.00 packs/day for 15 years    Quit date: 11/03/1973  . Smokeless tobacco: Never Used  . Alcohol Use: 3.0 oz/week    6 drink(s) per week     Comment: occ wine w/ supper  . Drug Use: No  . Sexual Activity: Yes    Birth Control/ Protection: Surgical     Comment: hysterectomy   Other Topics Concern  . None   Social History Narrative  . None   Past Surgical History  Procedure Laterality Date  . Rotator cuff repair Left 1996; 1981  . Cholecystectomy    . Total hip arthroplasty Left 10/2000  . Total hip  arthroplasty Right 06/2005  . Rotator cuff repair Right 1996  . Vaginal delivery    . Rotator cuff  Bilateral 1996/1998    repair  . Lipoma excision Left 10/1999    left thigh  . Total hip arthroplasty Left     10/2000  . Lung biopsy  05/2001  . Lumbosacral cyst  2005  . Appendectomy  1986    along with chole  . Breast biopsy Right 1997    sclerosing adenosis -Dr. Lucia Gaskins  . Abdominal hysterectomy  1982    BSO   Past Medical History  Diagnosis Date  . Lumbosacral spondylosis without myelopathy   . Pain in joint, upper arm   . Other chronic postoperative pain   . Spinal stenosis, lumbar region, without neurogenic claudication   . Osteoarthritis   . Melanoma     on left leg  . Thyroid disease     hypothyroidism  . Hypercholesteremia   . Cervical spinal stenosis   . Hypertension   . 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  Neurological: Positive for weakness and numbness.  All other systems reviewed and are negative.      Objective:   Physical Exam  Nursing note and vitals reviewed. Constitutional: She appears well-developed and well-nourished.  HENT:  Head: Normocephalic and atraumatic.  Eyes: Conjunctivae and EOM are normal. Pupils are equal, round, and reactive to light.  Psychiatric: She has a normal mood and affect.    Pain with right lateral bending Reduced Lt touch R C6 > L C6 dermatome Reduced Shoulder ROM ext rotation > Int rotation Reduced c spine ROM Gait without spasticity or imbalance Mood/affect approp    Assessment & Plan:  1.  Chronic low back pain with lumbar spondylosis.  Medial branch blocks 02/07/2014 still  effective, monitor for worsening  2.  Cervical stenosis with radiculitis followed by NS, no sign of myelopathy  3.  Chronic bilateral shoulder pain >3 mo response to image guided injection but not to palpation guided Left side performed 03/21/2014

## 2014-06-16 ENCOUNTER — Encounter: Payer: Medicare Other | Attending: Physical Medicine and Rehabilitation | Admitting: Registered Nurse

## 2014-06-16 ENCOUNTER — Encounter: Payer: Self-pay | Admitting: Registered Nurse

## 2014-06-16 VITALS — BP 125/72 | HR 65 | Resp 13 | Wt 148.0 lb

## 2014-06-16 DIAGNOSIS — M75102 Unspecified rotator cuff tear or rupture of left shoulder, not specified as traumatic: Secondary | ICD-10-CM

## 2014-06-16 DIAGNOSIS — M47817 Spondylosis without myelopathy or radiculopathy, lumbosacral region: Secondary | ICD-10-CM | POA: Diagnosis not present

## 2014-06-16 DIAGNOSIS — G8929 Other chronic pain: Secondary | ICD-10-CM | POA: Insufficient documentation

## 2014-06-16 DIAGNOSIS — Z79899 Other long term (current) drug therapy: Secondary | ICD-10-CM

## 2014-06-16 DIAGNOSIS — Z5181 Encounter for therapeutic drug level monitoring: Secondary | ICD-10-CM

## 2014-06-16 DIAGNOSIS — M12811 Other specific arthropathies, not elsewhere classified, right shoulder: Secondary | ICD-10-CM

## 2014-06-16 DIAGNOSIS — M79609 Pain in unspecified limb: Secondary | ICD-10-CM | POA: Diagnosis not present

## 2014-06-16 DIAGNOSIS — M25519 Pain in unspecified shoulder: Secondary | ICD-10-CM | POA: Diagnosis not present

## 2014-06-16 DIAGNOSIS — M47812 Spondylosis without myelopathy or radiculopathy, cervical region: Secondary | ICD-10-CM | POA: Diagnosis not present

## 2014-06-16 DIAGNOSIS — R209 Unspecified disturbances of skin sensation: Secondary | ICD-10-CM | POA: Diagnosis not present

## 2014-06-16 DIAGNOSIS — M75101 Unspecified rotator cuff tear or rupture of right shoulder, not specified as traumatic: Secondary | ICD-10-CM

## 2014-06-16 DIAGNOSIS — M19019 Primary osteoarthritis, unspecified shoulder: Secondary | ICD-10-CM

## 2014-06-16 DIAGNOSIS — M47816 Spondylosis without myelopathy or radiculopathy, lumbar region: Secondary | ICD-10-CM

## 2014-06-16 DIAGNOSIS — M12812 Other specific arthropathies, not elsewhere classified, left shoulder: Secondary | ICD-10-CM

## 2014-06-16 MED ORDER — MEPERIDINE HCL 50 MG PO TABS: 50.0000 mg | ORAL_TABLET | Freq: Two times a day (BID) | ORAL | Status: DC

## 2014-06-16 NOTE — Progress Notes (Signed)
Subjective:    Patient ID: Jenna [name], female    DOB: May 06, 1933, 80 y.o.   MRN: [id number]  HPI: Ms. DORA SIMEONE is a 80 year old female who returns for follow up for chronic pain and medication refill. She says her pain is located in her neck, left shoulder and lower back. She rates her pain 3. Her current exercise regime is walking short distances and she says she is very active with outside activities.    Pain Inventory Average Pain 3 Pain Right Now 3 My pain is intermittent and aching  In the last 24 hours, has pain interfered with the following? General activity 3 Relation with others 0 Enjoyment of life 3 What TIME of day is your pain at its worst? daytime and evening Sleep (in general) Fair  Pain is worse with: standing and some activites Pain improves with: rest and medication Relief from Meds: 6  Mobility walk without assistance ability to climb steps?  yes do you drive?  yes  Function retired  Neuro/Psych weakness tingling  Prior Studies Any changes since last visit?  no  Physicians involved in your care Any changes since last visit?  no   Family History  Problem Relation Age of Onset  . Stroke Mother   . Heart attack Father   . Diabetes Brother   . Heart disease Brother    History   Social History  . Marital Status: Married    Spouse Name: N/A    Number of Children: N/A  . Years of Education: N/A   Social History Main Topics  . Smoking status: Former Smoker -- 1.00 packs/day for 15 years    Quit date: 11/03/1973  . Smokeless tobacco: Never Used  . Alcohol Use: 3.0 oz/week    6 drink(s) per week     Comment: occ wine w/ supper  . Drug Use: No  . Sexual Activity: Yes    Birth Control/ Protection: Surgical     Comment: hysterectomy   Other Topics Concern  . None   Social History Narrative  . None   Past Surgical History  Procedure Laterality Date  . Rotator cuff repair Left 1996; 1981  . Cholecystectomy    . Total  hip arthroplasty Left 10/2000  . Total hip arthroplasty Right 06/2005  . Rotator cuff repair Right 1996  . Vaginal delivery    . Rotator cuff  Bilateral 1996/1998    repair  . Lipoma excision Left 10/1999    left thigh  . Total hip arthroplasty Left     10/2000  . Lung biopsy  05/2001  . Lumbosacral cyst  2005  . Appendectomy  1986    along with chole  . Breast biopsy Right 1997    sclerosing adenosis -Dr. Lucia Gaskins  . Abdominal hysterectomy  1982    BSO   Past Medical History  Diagnosis Date  . Lumbosacral spondylosis without myelopathy   . Pain in joint, upper arm   . Other chronic postoperative pain   . Spinal stenosis, lumbar region, without neurogenic claudication   . Osteoarthritis   . Melanoma     on left leg  . Thyroid disease     hypothyroidism  . Hypercholesteremia   . Cervical spinal stenosis   . Hypertension   . 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       Tingling  All other systems reviewed and are negative.      Objective:   Physical Exam  Nursing note and vitals reviewed. Constitutional: She is oriented to person, place, and time. She appears well-developed and well-nourished.  HENT:  Head: Normocephalic and atraumatic.  Neck: Normal range of motion. Neck supple.  Cardiovascular: Normal rate and regular rhythm.   Pulmonary/Chest: Effort normal and breath sounds normal.  Musculoskeletal:  Normal Muscle Bulk and Muscle Testing Reveals: Upper Extremities: Full ROM and Muscle Strength 5/5 on the Right. Left Arm with Decreased ROM 90 Degrees Spinal Forward Flexion 90 Degrees and Extension 10 Degrees Lumbar Paraspinal Tenderness: L-5 - S-1 Lower Extremities: Full ROM and Muscle strength 5/5 Arises from  chair with ease Narrow Based Gait  Neurological: She is alert and oriented to person, place, and time.  Skin: Skin is warm and dry.  Psychiatric: She has a normal mood and affect.          Assessment & Plan:  1. Chronic low back pain with lumbar spondylosis. Refilled: Meperidine 50 mg one tablet twice a day #60 2. Cervical stenosis with radiculitis : Neurosurgeon Following 3. Chronic bilateral shoulder pain: Continue Current Medication regime and exercise regime  20 minutes of face to face patient care time was spent during this visit. All questions was encouraged and answered.  F/U in 1 month

## 2014-06-30 ENCOUNTER — Ambulatory Visit (INDEPENDENT_AMBULATORY_CARE_PROVIDER_SITE_OTHER): Payer: Medicare Other | Admitting: Nurse Practitioner

## 2014-06-30 ENCOUNTER — Encounter: Payer: Self-pay | Admitting: Nurse Practitioner

## 2014-06-30 VITALS — BP 126/76 | HR 64 | Ht 62.0 in | Wt 148.0 lb

## 2014-06-30 DIAGNOSIS — Z1211 Encounter for screening for malignant neoplasm of colon: Secondary | ICD-10-CM

## 2014-06-30 DIAGNOSIS — Z01419 Encounter for gynecological examination (general) (routine) without abnormal findings: Secondary | ICD-10-CM

## 2014-06-30 MED ORDER — ESTROGENS, CONJUGATED 0.625 MG/GM VA CREA: TOPICAL_CREAM | VAGINAL | Status: DC

## 2014-06-30 MED ORDER — ESTRADIOL 0.025 MG/24HR TD PTTW: MEDICATED_PATCH | TRANSDERMAL | Status: DC

## 2014-06-30 NOTE — Patient Instructions (Signed)

## 2014-06-30 NOTE — Progress Notes (Signed)
Patient ID: Jenna [name], female   DOB: 1933-05-20, 80 y.o.   MRN: [id number] 80 y.o. G1P1 Married Caucasian Fe here for annual exam.  She is trying to taper ERT by using Vivelle dot only 1 time a week.  sometimes forgets.  She does not want to completely stop at this time.  She was using Vagifem and the cost is so high she wants to change to a cheaper drug that may be covered by her insurance better.  She feels the vaginal dryness has gotten worse and more pain with SA.  She is doing well otherwise except for persistent neck pain.  MRI shows cervical neck cord pressure.  Will see Dr. Ellene Route. Still numbness of right thumb and finger.  Some low back pain.  Patient's last menstrual period was 11/03/1980.          Sexually active: yes  The current method of family planning is status post hysterectomy. TAH/BSO Exercising: no The patient does not participate in regular exercise at present.  Smoker: no   Health Maintenance:  Pap: 2002 per paper chart, no report MMG: 03/28/14, Bi-Rads 2: benign findings Colonoscopy: 06/2003, spoke with GI and due to inability to swallow prep pt no longer getting colonoscopy, but has been advised to continue IFOB testing BMD: 02/2013 normal  TDaP: PCP maintains tetanus.  Shingles:  06/2013 Labs: PCP maintains all labs and urine.    reports that she quit smoking about 40 years ago. She has never used smokeless tobacco. She reports that she drinks about 3 ounces of alcohol per week. She reports that she does not use illicit drugs.  Past Medical History  Diagnosis Date  . Lumbosacral spondylosis without myelopathy   . Pain in joint, upper arm   . Other chronic postoperative pain   . Spinal stenosis, lumbar region, without neurogenic claudication   . Osteoarthritis   . Melanoma     on left leg  . Thyroid disease     hypothyroidism  . Hypercholesteremia   . Cervical spinal stenosis   . Hypertension   . Sarcoidosis of lung 05/2001    resolved within a year     Past Surgical History  Procedure Laterality Date  . Rotator cuff repair Left 1996; 1981  . Cholecystectomy    . Total hip arthroplasty Left 10/2000  . Total hip arthroplasty Right 06/2005  . Rotator cuff repair Right 1996  . Vaginal delivery    . Rotator cuff  Bilateral 1996/1998    repair  . Lipoma excision Left 10/1999    left thigh  . Total hip arthroplasty Left     10/2000  . Lung biopsy  05/2001  . Lumbosacral cyst  2005  . Appendectomy  1986    along with chole  . Breast biopsy Right 1997    sclerosing adenosis -Dr. Lucia Gaskins  . Abdominal hysterectomy  1982    BSO    Current Outpatient Prescriptions  Medication Sig Dispense Refill  . aspirin 81 MG tablet Take 160 mg by mouth daily.      [name] atenolol (TENORMIN) 25 MG tablet Take 25 mg by mouth 2 (two) times daily.       [name] atorvastatin (LIPITOR) 40 MG tablet Take 40 mg by mouth daily.      . Biotin 1 MG CAPS Take 5,000 mcg by mouth.       . calcium carbonate (OS-CAL) 600 MG TABS Take 600 mg by mouth 2 (two) times daily.      [name]  cholecalciferol (VITAMIN D) 1000 UNITS tablet Take 1,000 Units by mouth daily.      . diazepam (VALIUM) 5 MG tablet       . estradiol (VIVELLE-DOT) 0.025 MG/24HR Per patient request may have Estradiol patch 0.25mg  apply transdermal once weekly  24 patch  3  . fluticasone (FLONASE) 50 MCG/ACT nasal spray Place 2 sprays into the nose 2 (two) times daily.      . hyoscyamine (LEVSIN SL) 0.125 MG SL tablet Place 0.25 mg under the tongue every 4 (four) hours as needed.      [name] ibuprofen (ADVIL,MOTRIN) 200 MG tablet Take 400 mg by mouth 2 (two) times daily.      [name] levothyroxine (SYNTHROID, LEVOTHROID) 137 MCG tablet Take 112 mcg by mouth daily.       . meperidine (DEMEROL) 50 MG tablet Take 1 tablet (50 mg total) by mouth 2 (two) times daily.  60 tablet  0  . methocarbamol (ROBAXIN) 500 MG tablet Take 0.5 tablets (250 mg total) by mouth 3 (three) times daily as needed.  45 tablet  2  . Multiple Vitamin  (MULTIVITAMIN) tablet Take 1 tablet by mouth daily.      [name] omeprazole (PRILOSEC) 40 MG capsule Take 40 mg by mouth 2 (two) times daily.       . promethazine (PHENERGAN) 25 MG tablet TAKE 1 TABLET BY MOUTH TWICE DAILY  60 tablet  2  . triamterene-hydrochlorothiazide (MAXZIDE) 75-50 MG per tablet Take 1 tablet by mouth daily.      [name] conjugated estrogens (PREMARIN) vaginal cream Use 1/2 g vaginally twice weekly  42.5 g  3   No current facility-administered medications for this visit.    Family History  Problem Relation Age of Onset  . Stroke Mother   . Heart attack Father   . Diabetes Brother   . Heart disease Brother     ROS:  Pertinent items are noted in HPI.  Otherwise, a comprehensive ROS was negative.  Exam:   BP 126/76  Pulse 64  Ht 5\' 2"  (1.575 m)  Wt 148 lb (67.132 kg)  BMI 27.06 kg/m2  LMP 11/03/1980 Height: 5\' 2"  (157.5 cm)  Ht Readings from Last 3 Encounters:  06/30/14 5\' 2"  (1.575 m)  05/16/14 5\' 2"  (1.575 m)  03/21/14 5' 2.5" (1.588 m)    General appearance: alert, cooperative and appears stated age Head: Normocephalic, without obvious abnormality, atraumatic Neck: no adenopathy, supple, symmetrical, trachea midline and thyroid normal to inspection and palpation Lungs: clear to auscultation bilaterally Breasts: normal appearance, no masses or tenderness Heart: regular rate and rhythm Abdomen: soft, non-tender; no masses,  no organomegaly Extremities: extremities normal, atraumatic, no cyanosis or edema Skin: Skin color, texture, turgor normal. No rashes or lesions Lymph nodes: Cervical, supraclavicular, and axillary nodes normal. No abnormal inguinal nodes palpated Neurologic: Grossly normal   Pelvic: External genitalia:  no lesions              Urethra:  normal appearing urethra with no masses, tenderness or lesions              Bartholin's and Skene's: normal                 Vagina: normal appearing vagina with normal color and discharge, no lesions               Cervix: absent              Pap taken: No. Bimanual Exam:  Uterus:  uterus absent              Adnexa: no mass, fullness, tenderness               Rectovaginal: Confirms               Anus:  normal sphincter tone, no lesions  A:  Well Woman with normal exam  S/P TAH/ BSO secondary to fibroids on ERT 1982 trying to taper dose  Chronic neck pain - DDD seeing pain specialist   IBS, hypercholesterolemia, HTN, hypothyroid  P:   Reviewed health and wellness pertinent to exam  Pap smear not taken today  Mammogram is due 03/2015  IFOB is given  Refill on Vivelle dot 0.025 mg 1-2 times a week  New RX for Premarin vaginal cream instead of Vagifem since that is not covered by insurance  Counseled with risk of DVT, CVA, and cancer   Counseled on breast self exam, mammography screening, use and side effects of HRT, osteoporosis, adequate intake of calcium and vitamin D, diet and exercise, Kegel's exercises return annually or prn  An After Visit Summary was printed and given to the patient.

## 2014-07-03 NOTE — Progress Notes (Signed)
Encounter reviewed by Dr. Silver Achey Silva.  

## 2014-07-04 ENCOUNTER — Telehealth: Payer: Self-pay | Admitting: Emergency Medicine

## 2014-07-04 ENCOUNTER — Telehealth: Payer: Self-pay | Admitting: Nurse Practitioner

## 2014-07-04 NOTE — Telephone Encounter (Signed)
Received incoming call from technician, [place] at [facility] [employer] [employer].  They are calling to clarify order for patient from prior authorization.  Chi states that patient has been getting Climara patches at the pharmacy. Chi asked why the patient has been recieving Climara patches instead of Vivelle.  I advised Chi that I cannot speak to why she was getting Climara and not Vivelle as ordered.  Advised caller that patient is ordered for Vivelle dot 0.025 mg/day patch, apply one patch two times per week. This is in co-ordinance with note from provider, Milford Cage, [place] on annual exam 06/30/14 and the order that is on the prior authorization that was faxed today.   Routing to provider for final review. Patient agreeable to disposition. Will close encounter

## 2014-07-04 NOTE — Telephone Encounter (Signed)
Spoke to patient and she got the Vivelle dot and not the Climara.  She did get the Premarin cream today.

## 2014-07-13 ENCOUNTER — Encounter: Payer: Medicare Other | Attending: Physical Medicine and Rehabilitation | Admitting: Registered Nurse

## 2014-07-13 ENCOUNTER — Encounter: Payer: Self-pay | Admitting: Registered Nurse

## 2014-07-13 VITALS — BP 139/54 | HR 64 | Resp 14 | Wt 149.8 lb

## 2014-07-13 DIAGNOSIS — M47812 Spondylosis without myelopathy or radiculopathy, cervical region: Secondary | ICD-10-CM | POA: Diagnosis not present

## 2014-07-13 DIAGNOSIS — M47817 Spondylosis without myelopathy or radiculopathy, lumbosacral region: Secondary | ICD-10-CM | POA: Insufficient documentation

## 2014-07-13 DIAGNOSIS — M25519 Pain in unspecified shoulder: Secondary | ICD-10-CM | POA: Diagnosis not present

## 2014-07-13 DIAGNOSIS — R209 Unspecified disturbances of skin sensation: Secondary | ICD-10-CM | POA: Diagnosis not present

## 2014-07-13 DIAGNOSIS — M12812 Other specific arthropathies, not elsewhere classified, left shoulder: Secondary | ICD-10-CM

## 2014-07-13 DIAGNOSIS — G8929 Other chronic pain: Secondary | ICD-10-CM | POA: Diagnosis present

## 2014-07-13 DIAGNOSIS — Z5181 Encounter for therapeutic drug level monitoring: Secondary | ICD-10-CM

## 2014-07-13 DIAGNOSIS — M75102 Unspecified rotator cuff tear or rupture of left shoulder, not specified as traumatic: Secondary | ICD-10-CM

## 2014-07-13 DIAGNOSIS — M75101 Unspecified rotator cuff tear or rupture of right shoulder, not specified as traumatic: Secondary | ICD-10-CM

## 2014-07-13 DIAGNOSIS — M47816 Spondylosis without myelopathy or radiculopathy, lumbar region: Secondary | ICD-10-CM

## 2014-07-13 DIAGNOSIS — Z79899 Other long term (current) drug therapy: Secondary | ICD-10-CM

## 2014-07-13 DIAGNOSIS — M19019 Primary osteoarthritis, unspecified shoulder: Secondary | ICD-10-CM

## 2014-07-13 DIAGNOSIS — M12811 Other specific arthropathies, not elsewhere classified, right shoulder: Secondary | ICD-10-CM

## 2014-07-13 DIAGNOSIS — M79609 Pain in unspecified limb: Secondary | ICD-10-CM | POA: Diagnosis not present

## 2014-07-13 MED ORDER — MEPERIDINE HCL 50 MG PO TABS: 50.0000 mg | ORAL_TABLET | Freq: Two times a day (BID) | ORAL | Status: DC

## 2014-07-13 NOTE — Progress Notes (Signed)
Subjective:    Patient ID: Jenna [name], female    DOB: [birth year], 80 y.o.   MRN: [id number]  HPI: [name] is a 80 year old female who returns for follow up for chronic pain and medication refill. She says her pain is located in her left shoulder and lower back. At times she has numbness in her right hand. She rates her pain 2. Her current exercise regime is walking short distances.  Pain Inventory Average Pain 4 Pain Right Now 2 My pain is intermittent and aching  In the last 24 hours, has pain interfered with the following? General activity 3 Relation with others 2 Enjoyment of life 3 What TIME of day is your pain at its worst? morning and evening Sleep (in general) Fair  Pain is worse with: walking, standing and some activites Pain improves with: rest and medication Relief from Meds: 6  Mobility walk without assistance how many minutes can you walk? 10-15 ability to climb steps?  yes do you drive?  yes  Function retired  Neuro/Psych weakness numbness  Prior Studies Any changes since last visit?  no  Physicians involved in your care Any changes since last visit?  no   Family History  Problem Relation Age of Onset  . Stroke Mother   . Heart attack Father   . Diabetes Brother   . Heart disease Brother    History   Social History  . Marital Status: Married    Spouse Name: N/A    Number of Children: N/A  . Years of Education: N/A   Social History Main Topics  . Smoking status: Former Smoker -- 1.00 packs/day for 15 years    Quit date: 11/03/1973  . Smokeless tobacco: Never Used  . Alcohol Use: 3.0 oz/week    6 drink(s) per week     Comment: occ wine w/ supper  . Drug Use: No  . Sexual Activity: Yes    Birth Control/ Protection: Surgical     Comment: hysterectomy   Other Topics Concern  . None   Social History Narrative  . None   Past Surgical History  Procedure Laterality Date  . Rotator cuff repair Left 1996; 1981  .  Cholecystectomy    . Total hip arthroplasty Left 10/2000  . Total hip arthroplasty Right 06/2005  . Rotator cuff repair Right 1996  . Vaginal delivery    . Rotator cuff  Bilateral 1996/1998    repair  . Lipoma excision Left 10/1999    left thigh  . Total hip arthroplasty Left     10/2000  . Lung biopsy  05/2001  . Lumbosacral cyst  2005  . Appendectomy  1986    along with chole  . Breast biopsy Right 1997    sclerosing adenosis -Dr. Lucia Gaskins  . Abdominal hysterectomy  1982    BSO   Past Medical History  Diagnosis Date  . Lumbosacral spondylosis without myelopathy   . Pain in joint, upper arm   . Other chronic postoperative pain   . Spinal stenosis, lumbar region, without neurogenic claudication   . Osteoarthritis   . Melanoma     on left leg  . Thyroid disease     hypothyroidism  . Hypercholesteremia   . Cervical spinal stenosis   . Hypertension   . 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  All other systems reviewed and are negative.      Objective:   Physical Exam  Nursing note and vitals reviewed. Constitutional: She is oriented to person, place, and time. She appears well-developed and well-nourished.  HENT:  Head: Normocephalic and atraumatic.  Neck: Normal range of motion. Neck supple.  Cardiovascular: Normal rate and regular rhythm.   Pulmonary/Chest: Effort normal and breath sounds normal.  Musculoskeletal:  Normal Muscle Bulk and Muscle testing Reveals:  Upper Extremities: Right Full ROM and Muscle Strength 5/5. Left Decreased ROM 90 Degrees and Muscle strength 5/5 Back without Spinal or Paraspinal Tenderness Lower Extremities: Full ROM and Muscle strength 5/5 Arises  from chair with ease Narrow based gait   Neurological: She is alert and oriented to person, place, and time.  Skin: Skin is warm and dry.  Psychiatric: She has a normal mood and affect.          Assessment & Plan:  1. Chronic low back pain with lumbar spondylosis.  Refilled: Meperidine 50 mg one tablet twice a day #60  2. Cervical stenosis with radiculitis : Neurosurgeon Following  3. Chronic bilateral shoulder pain: Schedule Next appointment with Dr. Letta Pate for Cortisone Injection. Continue Current Medication regime and exercise regime   20 minutes of face to face patient care time was spent during this visit. All questions was encouraged and answered.   F/U in 1 month

## 2014-07-19 LAB — FECAL OCCULT BLOOD, IMMUNOCHEMICAL: IFOBT: NEGATIVE

## 2014-07-19 NOTE — Addendum Note (Signed)
Addended by: Abelino Derrick C on: 07/19/2014 11:04 AM   Modules accepted: Orders

## 2014-08-02 ENCOUNTER — Other Ambulatory Visit: Payer: Self-pay | Admitting: Neurological Surgery

## 2014-08-02 DIAGNOSIS — M5000 Cervical disc disorder with myelopathy, unspecified cervical region: Secondary | ICD-10-CM

## 2014-08-09 ENCOUNTER — Encounter: Payer: Medicare Other | Attending: Physical Medicine and Rehabilitation | Admitting: Registered Nurse

## 2014-08-09 ENCOUNTER — Encounter: Payer: Self-pay | Admitting: Registered Nurse

## 2014-08-09 VITALS — BP 150/72 | HR 72 | Resp 14 | Ht 62.5 in | Wt 148.0 lb

## 2014-08-09 DIAGNOSIS — M47812 Spondylosis without myelopathy or radiculopathy, cervical region: Secondary | ICD-10-CM | POA: Diagnosis present

## 2014-08-09 DIAGNOSIS — Z5181 Encounter for therapeutic drug level monitoring: Secondary | ICD-10-CM | POA: Diagnosis present

## 2014-08-09 DIAGNOSIS — Z79899 Other long term (current) drug therapy: Secondary | ICD-10-CM | POA: Diagnosis present

## 2014-08-09 DIAGNOSIS — M75102 Unspecified rotator cuff tear or rupture of left shoulder, not specified as traumatic: Secondary | ICD-10-CM

## 2014-08-09 DIAGNOSIS — M75101 Unspecified rotator cuff tear or rupture of right shoulder, not specified as traumatic: Secondary | ICD-10-CM

## 2014-08-09 DIAGNOSIS — M12812 Other specific arthropathies, not elsewhere classified, left shoulder: Secondary | ICD-10-CM | POA: Diagnosis present

## 2014-08-09 DIAGNOSIS — M47816 Spondylosis without myelopathy or radiculopathy, lumbar region: Secondary | ICD-10-CM | POA: Diagnosis not present

## 2014-08-09 DIAGNOSIS — M12811 Other specific arthropathies, not elsewhere classified, right shoulder: Secondary | ICD-10-CM | POA: Diagnosis present

## 2014-08-09 MED ORDER — MEPERIDINE HCL 50 MG PO TABS: 50.0000 mg | ORAL_TABLET | Freq: Two times a day (BID) | ORAL | Status: DC

## 2014-08-09 NOTE — Progress Notes (Addendum)
Subjective:    Patient ID: Jenna [name], female    DOB: 12-Dec-1932, 80 y.o.   MRN: [id number]  HPI: [name] is a 80 year old female who returns for follow up for chronic pain and medication refill. She says her pain is located in her lower back. Occasionally has muscle spasms during the night. On Robaxin.  At times she has numbness and tingling in her right hand. Dr. Ellene Route following and she is scheduled for a MRI on 08/15/14 and F/U appointment with Dr. Ellene Route on 10/ 15/15. She rates her pain 2. Her current exercise regime is walking short distances.  She seen Dr. Veverly Fells PA two weeks ago for a steroid injection in her left shoulder, relief was noted.  Pain Inventory Average Pain 2 Pain Right Now 2 My pain is intermittent and aching  In the last 24 hours, has pain interfered with the following? General activity 3 Relation with others 0 Enjoyment of life 2 What TIME of day is your pain at its worst? daytime and evening Sleep (in general) Fair  Pain is worse with: standing and some activites Pain improves with: rest, medication and injections Relief from Meds: 7  Mobility walk without assistance  Function retired  Neuro/Psych No problems in this area  Prior Studies Any changes since last visit?  no  Physicians involved in your care Any changes since last visit?  no   Family History  Problem Relation Age of Onset  . Stroke Mother   . Heart attack Father   . Diabetes Brother   . Heart disease Brother    History   Social History  . Marital Status: Married    Spouse Name: N/A    Number of Children: N/A  . Years of Education: N/A   Social History Main Topics  . Smoking status: Former Smoker -- 1.00 packs/day for 15 years    Quit date: 11/03/1973  . Smokeless tobacco: Never Used  . Alcohol Use: 3.0 oz/week    6 drink(s) per week     Comment: occ wine w/ supper  . Drug Use: No  . Sexual Activity: Yes    Birth Control/ Protection: Surgical   Comment: hysterectomy   Other Topics Concern  . None   Social History Narrative  . None   Past Surgical History  Procedure Laterality Date  . Rotator cuff repair Left 1996; 1981  . Cholecystectomy    . Total hip arthroplasty Left 10/2000  . Total hip arthroplasty Right 06/2005  . Rotator cuff repair Right 1996  . Vaginal delivery    . Rotator cuff  Bilateral 1996/1998    repair  . Lipoma excision Left 10/1999    left thigh  . Total hip arthroplasty Left     10/2000  . Lung biopsy  05/2001  . Lumbosacral cyst  2005  . Appendectomy  1986    along with chole  . Breast biopsy Right 1997    sclerosing adenosis -Dr. Lucia Gaskins  . Abdominal hysterectomy  1982    BSO   Past Medical History  Diagnosis Date  . Lumbosacral spondylosis without myelopathy   . Pain in joint, upper arm   . Other chronic postoperative pain   . Spinal stenosis, lumbar region, without neurogenic claudication   . Osteoarthritis   . Melanoma     on left leg  . Thyroid disease     hypothyroidism  . Hypercholesteremia   . Cervical spinal stenosis   . Hypertension   .  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 Constitutional: She is oriented to person, place, and time. She appears well-developed and well-nourished.  HENT:  Head: Normocephalic and atraumatic.  Neck: Normal range of motion. Neck supple.  Cardiovascular: Normal rate and regular rhythm.   Pulmonary/Chest: Effort normal and breath sounds normal.  Abdominal:      Musculoskeletal:  Normal Muscle Bulk and Muscle Testing Reveals: Upper Extremities: Full ROM and Muscle strength 5/5 Back without spinal or paraspinal Tenderness Lower Extremities: Full  ROM and Muscle strength 5/5   Neurological: She is alert and oriented to person, place, and time.  Skin: Skin is warm and dry.  Psychiatric: She has a normal mood and affect.          Assessment & Plan:  1. Chronic low back pain with lumbar spondylosis.  Refilled: Meperidine 50 mg one tablet twice a day #60  2. Cervical stenosis with radiculitis : MRI Scheduled/ Dr. Ellene Route Following Neurosurgeon Following  3. Chronic bilateral shoulder pain: S/P Steroid Injection at Dr. Veverly Fells Office? PA Relief Noted.  20 minutes of face to face patient care time was spent during this visit. All questions was encouraged and answered.   F/U in 1 month

## 2014-08-10 ENCOUNTER — Other Ambulatory Visit: Payer: Self-pay | Admitting: Nurse Practitioner

## 2014-08-10 ENCOUNTER — Telehealth: Payer: Self-pay | Admitting: Nurse Practitioner

## 2014-08-10 MED ORDER — ESTRADIOL 0.025 MG/24HR TD PTTW: MEDICATED_PATCH | TRANSDERMAL | Status: DC

## 2014-08-10 NOTE — Telephone Encounter (Signed)
Patient calling with questions about the medication below. She says she "is a little confused."  estradiol (VIVELLE-DOT) 0.025 MG/24HR  Per patient request may have Estradiol patch 0.25mg  apply transdermal once weekly, Print, Last Dose: Not Recorded  Refills: 3 ordered Pharmacy: [facility], [place]

## 2014-08-10 NOTE — Telephone Encounter (Signed)
Spoke with patient. Patient states that she spoke with the pharmacist and was advised that to get the same amount of estrogen that she was getting before she would need to be using this patch twice a week. Patient states that since rx was written for once per week she is unable to do this. Patient states that previous patch she was on she was only using once per week. "I am a little confused." For three month supply of vivelle dot with using one patch per week it is 192.55. Patient states that the patch she was on before for a three month supply which was written to use 2 times per week was 162 a month and she was really getting 6 months out of the rx. Patient would like to know what Milford Cage, FNP would like for her to do. Advised would route to [employer], FNP and return call with further instructions. Patient is agreeable.

## 2014-08-10 NOTE — Telephone Encounter (Signed)
I have called the patient and have discussed her Vivelle dot.  A new order was placed that hopefully will clarify andy confusion with the pharmacist.  She will follow up with [facility] drug and if further questions to call back.

## 2014-08-15 ENCOUNTER — Ambulatory Visit
Admission: RE | Admit: 2014-08-15 | Discharge: 2014-08-15 | Disposition: A | Discharge status: Home / Self Care | Payer: Medicare Other | Source: Ambulatory Visit | Attending: Neurological Surgery | Admitting: Neurological Surgery

## 2014-08-15 DIAGNOSIS — M5000 Cervical disc disorder with myelopathy, unspecified cervical region: Secondary | ICD-10-CM

## 2014-08-22 ENCOUNTER — Ambulatory Visit: Payer: Medicare Other | Admitting: Physical Medicine & Rehabilitation

## 2014-08-22 ENCOUNTER — Other Ambulatory Visit: Payer: Self-pay | Admitting: Neurological Surgery

## 2014-09-04 ENCOUNTER — Encounter: Payer: Self-pay | Admitting: Registered Nurse

## 2014-09-07 ENCOUNTER — Encounter: Payer: Medicare Other | Attending: Physical Medicine and Rehabilitation | Admitting: Registered Nurse

## 2014-09-07 ENCOUNTER — Encounter: Payer: Self-pay | Admitting: Registered Nurse

## 2014-09-07 VITALS — BP 143/78 | HR 69 | Resp 14 | Ht 62.5 in | Wt 150.0 lb

## 2014-09-07 DIAGNOSIS — M12811 Other specific arthropathies, not elsewhere classified, right shoulder: Secondary | ICD-10-CM | POA: Diagnosis present

## 2014-09-07 DIAGNOSIS — M75101 Unspecified rotator cuff tear or rupture of right shoulder, not specified as traumatic: Secondary | ICD-10-CM

## 2014-09-07 DIAGNOSIS — Z79899 Other long term (current) drug therapy: Secondary | ICD-10-CM | POA: Diagnosis present

## 2014-09-07 DIAGNOSIS — M75102 Unspecified rotator cuff tear or rupture of left shoulder, not specified as traumatic: Secondary | ICD-10-CM

## 2014-09-07 DIAGNOSIS — M12812 Other specific arthropathies, not elsewhere classified, left shoulder: Secondary | ICD-10-CM | POA: Diagnosis present

## 2014-09-07 DIAGNOSIS — M47812 Spondylosis without myelopathy or radiculopathy, cervical region: Secondary | ICD-10-CM | POA: Diagnosis present

## 2014-09-07 DIAGNOSIS — M47816 Spondylosis without myelopathy or radiculopathy, lumbar region: Secondary | ICD-10-CM | POA: Diagnosis not present

## 2014-09-07 DIAGNOSIS — Z5181 Encounter for therapeutic drug level monitoring: Secondary | ICD-10-CM | POA: Insufficient documentation

## 2014-09-07 MED ORDER — MEPERIDINE HCL 50 MG PO TABS: 50.0000 mg | ORAL_TABLET | Freq: Two times a day (BID) | ORAL | Status: DC

## 2014-09-07 NOTE — Progress Notes (Signed)
Subjective:    Patient ID: Jenna [name], female    DOB: 1933-02-14, 80 y.o.   MRN: [id number]  HPI: [name] is a 80 year old female who returns for follow up for chronic pain and medication refill. She says her pain is located in her left shoulder and mid- lower back.. She rates her pain 2. Her current exercise regime is walking short distances. She is scheduled for anterior cervical fusion with Dr. Ellene Route on 10/03/2014  Pain Inventory Average Pain 4 Pain Right Now 2 My pain is no pain descriptors  In the last 24 hours, has pain interfered with the following? General activity 4 Relation with others 0 Enjoyment of life 3 What TIME of day is your pain at its worst? daytime, evening Sleep (in general) Good  Pain is worse with: standing and some activites Pain improves with: rest, medication and injections Relief from Meds: 6  Mobility walk without assistance how many minutes can you walk? 15-20 ability to climb steps?  yes do you drive?  yes Do you have any goals in this area?  yes  Function retired Do you have any goals in this area?  no  Neuro/Psych weakness numbness  Prior Studies Any changes since last visit?  no  Physicians involved in your care Any changes since last visit?  no   Family History  Problem Relation Age of Onset  . Stroke Mother   . Heart attack Father   . Diabetes Brother   . Heart disease Brother    History   Social History  . Marital Status: Married    Spouse Name: N/A    Number of Children: N/A  . Years of Education: N/A   Social History Main Topics  . Smoking status: Former Smoker -- 1.00 packs/day for 15 years    Quit date: 11/03/1973  . Smokeless tobacco: Never Used  . Alcohol Use: 3.0 oz/week    6 drink(s) per week     Comment: occ wine w/ supper  . Drug Use: No  . Sexual Activity: Yes    Birth Control/ Protection: Surgical     Comment: hysterectomy   Other Topics Concern  . None   Social History  Narrative   Past Surgical History  Procedure Laterality Date  . Rotator cuff repair Left 1996; 1981  . Cholecystectomy    . Total hip arthroplasty Left 10/2000  . Total hip arthroplasty Right 06/2005  . Rotator cuff repair Right 1996  . Vaginal delivery    . Rotator cuff  Bilateral 1996/1998    repair  . Lipoma excision Left 10/1999    left thigh  . Total hip arthroplasty Left     10/2000  . Lung biopsy  05/2001  . Lumbosacral cyst  2005  . Appendectomy  1986    along with chole  . Breast biopsy Right 1997    sclerosing adenosis -Dr. Lucia Gaskins  . Abdominal hysterectomy  1982    BSO   Past Medical History  Diagnosis Date  . Lumbosacral spondylosis without myelopathy   . Pain in joint, upper arm   . Other chronic postoperative pain   . Spinal stenosis, lumbar region, without neurogenic claudication   . Osteoarthritis   . Melanoma     on left leg  . Thyroid disease     hypothyroidism  . Hypercholesteremia   . Cervical spinal stenosis   . Hypertension   . 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 She appears well-developed and well-nourished.  HENT:  Head: Normocephalic and atraumatic.  Neck: Normal range of motion. Neck supple.  Cardiovascular: Normal rate and regular rhythm.   Pulmonary/Chest: Effort normal and breath sounds normal.  Musculoskeletal:  Normal Muscle Bulk and Muscle Testing Reveals: Upper Extremities: Right: Full ROM and MUscle Strength 5/5 Left Decreased ROM and Muscle Strength 5/5 Back without spinal or paraspinal tenderness Lower Extremities: Full ROM and Muscle strength 5/5 Arises from chair with ease Narrow Based gait Arises  from chair with ease  Neurological: She is alert and oriented to person, place, and time.  Skin: Skin is warm and dry.  Psychiatric: She has a normal mood and affect.          Assessment & Plan:  1. Chronic low back pain with lumbar spondylosis.  Refilled: Meperidine 50 mg one tablet twice a day #60  2. Cervical stenosis with radiculitis : Schedule for Anterior Cervical Fusion C3-C4 with Dr. Ellene Route on 10/03/2014 3. Chronic bilateral shoulder pain: Continue with Current medication regime. Surgery Pending  20 minutes of face to face patient care time was spent during this visit. All questions was encouraged and answered.   F/U in 1 month

## 2014-09-26 ENCOUNTER — Encounter (HOSPITAL_COMMUNITY): Payer: Self-pay

## 2014-09-26 ENCOUNTER — Encounter (HOSPITAL_COMMUNITY)
Admission: RE | Admit: 2014-09-26 | Discharge: 2014-09-26 | Disposition: A | Discharge status: Home / Self Care | Payer: Medicare Other | Source: Ambulatory Visit | Attending: Neurological Surgery | Admitting: Neurological Surgery

## 2014-09-26 DIAGNOSIS — Z01812 Encounter for preprocedural laboratory examination: Secondary | ICD-10-CM | POA: Insufficient documentation

## 2014-09-26 HISTORY — DX: Myoneural disorder, unspecified: G70.9

## 2014-09-26 HISTORY — DX: Pneumonia, unspecified organism: J18.9

## 2014-09-26 HISTORY — DX: Cardiac arrhythmia, unspecified: I49.9

## 2014-09-26 HISTORY — DX: Personal history of other medical treatment: Z92.89

## 2014-09-26 LAB — CBC
HCT: 38.2 % (ref 36.0–46.0)
Hemoglobin: 13.4 g/dL (ref 12.0–15.0)
MCH: 33.5 pg (ref 26.0–34.0)
MCHC: 35.1 g/dL (ref 30.0–36.0)
MCV: 95.5 fL (ref 78.0–100.0)
Platelets: 295 10*3/uL (ref 150–400)
RBC: 4 MIL/uL (ref 3.87–5.11)
RDW: 12.2 % (ref 11.5–15.5)
WBC: 7 10*3/uL (ref 4.0–10.5)

## 2014-09-26 LAB — BASIC METABOLIC PANEL
Anion gap: 13 (ref 5–15)
BUN: 11 mg/dL (ref 6–23)
CHLORIDE: 97 meq/L (ref 96–112)
CO2: 25 mEq/L (ref 19–32)
Calcium: 9.1 mg/dL (ref 8.4–10.5)
Creatinine, Ser: 0.7 mg/dL (ref 0.50–1.10)
GFR calc non Af Amer: 80 mL/min — ABNORMAL LOW (ref >90)
GLUCOSE: 91 mg/dL (ref 70–99)
Potassium: 3.9 mEq/L (ref 3.7–5.3)
Sodium: 135 mEq/L — ABNORMAL LOW (ref 137–147)

## 2014-09-26 LAB — SURGICAL PCR SCREEN
MRSA, PCR: NEGATIVE
Staphylococcus aureus: NEGATIVE

## 2014-09-26 NOTE — Pre-Procedure Instructions (Signed)
Jenna [name]  09/26/2014   Your procedure is scheduled on:  10/03/2014  Report to [facility] Admitting at   5:30 AM.  Call this number if you have problems the morning of surgery: [phone]   Remember:   Do not eat food or drink liquids after midnight. On Monday night   Take these medicines the morning of surgery with A SIP OF WATER: Atenolol, Allegra, Flonase, Thyroid medicine, Demerol, Phenergan, Prilosec   Do not wear jewelry, make-up or nail polish.   Do not wear lotions, powders, or perfumes. You may wear deodorant.   Do not shave 48 hours prior to surgery.   Do not bring valuables to the hospital.  [facility] is not responsible                  for any belongings or valuables.               Contacts, dentures or bridgework may not be worn into surgery.   Leave suitcase in the car. After surgery it may be brought to your room.   For patients admitted to the hospital, discharge time is determined by your                treatment team.               Patients discharged the day of surgery will not be allowed to drive  home.  Name and phone number of your driver: with spouse  Special Instructions: Special Instructions: [facility] - Preparing for Surgery  Before surgery, you can play an important role.  Because skin is not sterile, your skin needs to be as free of germs as possible.  You can reduce the number of germs on you skin by washing with CHG (chlorahexidine gluconate) soap before surgery.  CHG is an antiseptic cleaner which kills germs and bonds with the skin to continue killing germs even after washing.  Please DO NOT use if you have an allergy to CHG or antibacterial soaps.  If your skin becomes reddened/irritated stop using the CHG and inform your nurse when you arrive at Short Stay.  Do not shave (including legs and underarms) for at least 48 hours prior to the first CHG shower.  You may shave your face.  Please follow these instructions  carefully:   1.  Shower with CHG Soap the night before surgery and the  morning of Surgery.  2.  If you choose to wash your hair, wash your hair first as usual with your  normal shampoo.  3.  After you shampoo, rinse your hair and body thoroughly to remove the  Shampoo.  4.  Use CHG as you would any other liquid soap.  You can apply chg directly to the skin and wash gently with scrungie or a clean washcloth.  5.  Apply the CHG Soap to your body ONLY FROM THE NECK DOWN.    Do not use on open wounds or open sores.  Avoid contact with your eyes, ears, mouth and genitals (private parts).  Wash genitals (private parts)   with your normal soap.  6.  Wash thoroughly, paying special attention to the area where your surgery will be performed.  7.  Thoroughly rinse your body with warm water from the neck down.  8.  DO NOT shower/wash with your normal soap after using and rinsing off   the CHG Soap.  9.  Pat yourself dry with a clean towel.  10.  Wear clean pajamas.            11.  Place clean sheets on your bed the night of your first shower and do not sleep with pets.  Day of Surgery  Do not apply any lotions/deodorants the morning of surgery.  Please wear clean clothes to the hospital/surgery center.   Please read over the following fact sheets that you were given: Pain Booklet, Coughing and Deep Breathing, MRSA Information and Surgical Site Infection Prevention

## 2014-09-27 NOTE — Progress Notes (Signed)
Call to Dr. Mancel Bale office for last EKG, office note, following up from fax request sent yesterday.  There office is closed for the holiday until Mon. 10/02/2014.

## 2014-10-02 MED ORDER — VANCOMYCIN HCL IN DEXTROSE 1-5 GM/200ML-% IV SOLN
1000.0000 mg | INTRAVENOUS | Status: AC
  Administered 2014-10-03: 1000 mg via INTRAVENOUS
  Filled 2014-10-02: qty 200

## 2014-10-03 ENCOUNTER — Encounter (HOSPITAL_COMMUNITY)
Admission: RE | Disposition: A | Discharge status: Home / Self Care | Payer: Self-pay | Source: Ambulatory Visit | Attending: Neurological Surgery

## 2014-10-03 ENCOUNTER — Inpatient Hospital Stay (HOSPITAL_COMMUNITY): Payer: Medicare Other

## 2014-10-03 ENCOUNTER — Ambulatory Visit (HOSPITAL_COMMUNITY)
Admission: RE | Admit: 2014-10-03 | Discharge: 2014-10-04 | Disposition: A | Discharge status: Home / Self Care | Payer: Medicare Other | Source: Ambulatory Visit | Attending: Neurological Surgery | Admitting: Neurological Surgery

## 2014-10-03 ENCOUNTER — Inpatient Hospital Stay (HOSPITAL_COMMUNITY): Payer: Medicare Other | Admitting: Anesthesiology

## 2014-10-03 DIAGNOSIS — Z881 Allergy status to other antibiotic agents status: Secondary | ICD-10-CM | POA: Diagnosis not present

## 2014-10-03 DIAGNOSIS — E780 Pure hypercholesterolemia: Secondary | ICD-10-CM | POA: Diagnosis not present

## 2014-10-03 DIAGNOSIS — Z87891 Personal history of nicotine dependence: Secondary | ICD-10-CM | POA: Insufficient documentation

## 2014-10-03 DIAGNOSIS — M1990 Unspecified osteoarthritis, unspecified site: Secondary | ICD-10-CM | POA: Insufficient documentation

## 2014-10-03 DIAGNOSIS — Z7982 Long term (current) use of aspirin: Secondary | ICD-10-CM | POA: Diagnosis not present

## 2014-10-03 DIAGNOSIS — Z886 Allergy status to analgesic agent status: Secondary | ICD-10-CM | POA: Diagnosis not present

## 2014-10-03 DIAGNOSIS — M5000 Cervical disc disorder with myelopathy, unspecified cervical region: Secondary | ICD-10-CM

## 2014-10-03 DIAGNOSIS — Z888 Allergy status to other drugs, medicaments and biological substances status: Secondary | ICD-10-CM | POA: Insufficient documentation

## 2014-10-03 DIAGNOSIS — Z79899 Other long term (current) drug therapy: Secondary | ICD-10-CM | POA: Insufficient documentation

## 2014-10-03 DIAGNOSIS — M4712 Other spondylosis with myelopathy, cervical region: Principal | ICD-10-CM | POA: Insufficient documentation

## 2014-10-03 DIAGNOSIS — E039 Hypothyroidism, unspecified: Secondary | ICD-10-CM | POA: Insufficient documentation

## 2014-10-03 DIAGNOSIS — Z885 Allergy status to narcotic agent status: Secondary | ICD-10-CM | POA: Diagnosis not present

## 2014-10-03 DIAGNOSIS — M5023 Other cervical disc displacement, cervicothoracic region: Secondary | ICD-10-CM | POA: Insufficient documentation

## 2014-10-03 DIAGNOSIS — I10 Essential (primary) hypertension: Secondary | ICD-10-CM | POA: Diagnosis not present

## 2014-10-03 HISTORY — PX: ANTERIOR CERVICAL DECOMP/DISCECTOMY FUSION: SHX1161

## 2014-10-03 SURGERY — ANTERIOR CERVICAL DECOMPRESSION/DISCECTOMY FUSION 1 LEVEL
Anesthesia: General | Site: Spine Cervical

## 2014-10-03 MED ORDER — HYDROMORPHONE HCL 1 MG/ML IJ SOLN
0.2500 mg | INTRAMUSCULAR | Status: DC | PRN
  Administered 2014-10-03 (×2): 0.5 mg via INTRAVENOUS

## 2014-10-03 MED ORDER — SODIUM CHLORIDE 0.9 % IV SOLN: 250.0000 mL | INTRAVENOUS | Status: DC

## 2014-10-03 MED ORDER — CILIDINIUM-CHLORDIAZEPOXIDE 2.5-5 MG PO CAPS
1.0000 | ORAL_CAPSULE | Freq: Three times a day (TID) | ORAL | Status: DC | PRN
  Filled 2014-10-03: qty 1

## 2014-10-03 MED ORDER — SUCCINYLCHOLINE CHLORIDE 20 MG/ML IJ SOLN
INTRAMUSCULAR | Status: AC
  Filled 2014-10-03: qty 1

## 2014-10-03 MED ORDER — LACTATED RINGERS IV SOLN
INTRAVENOUS | Status: DC
  Administered 2014-10-03: 08:00:00 via INTRAVENOUS

## 2014-10-03 MED ORDER — ACETAMINOPHEN 650 MG RE SUPP: 650.0000 mg | RECTAL | Status: DC | PRN

## 2014-10-03 MED ORDER — MORPHINE SULFATE 2 MG/ML IJ SOLN
1.0000 mg | INTRAMUSCULAR | Status: DC | PRN
  Administered 2014-10-03: 2 mg via INTRAVENOUS
  Filled 2014-10-03: qty 1

## 2014-10-03 MED ORDER — SODIUM CHLORIDE 0.9 % IR SOLN
Status: DC | PRN
  Administered 2014-10-03: 500 mL

## 2014-10-03 MED ORDER — SODIUM CHLORIDE 0.9 % IJ SOLN
3.0000 mL | Freq: Two times a day (BID) | INTRAMUSCULAR | Status: DC
  Administered 2014-10-03: 3 mL via INTRAVENOUS

## 2014-10-03 MED ORDER — PHENOL 1.4 % MT LIQD: 1.0000 | OROMUCOSAL | Status: DC | PRN

## 2014-10-03 MED ORDER — MENTHOL 3 MG MT LOZG: 1.0000 | LOZENGE | OROMUCOSAL | Status: DC | PRN

## 2014-10-03 MED ORDER — PROMETHAZINE HCL 25 MG/ML IJ SOLN: 6.2500 mg | INTRAMUSCULAR | Status: DC | PRN

## 2014-10-03 MED ORDER — METHOCARBAMOL 500 MG PO TABS
500.0000 mg | ORAL_TABLET | Freq: Four times a day (QID) | ORAL | Status: DC | PRN
  Administered 2014-10-03 – 2014-10-04 (×3): 500 mg via ORAL
  Filled 2014-10-03 (×3): qty 1

## 2014-10-03 MED ORDER — ONDANSETRON HCL 4 MG/2ML IJ SOLN
4.0000 mg | INTRAMUSCULAR | Status: DC | PRN
Start: 2014-10-03 — End: 2014-10-04

## 2014-10-03 MED ORDER — PROMETHAZINE HCL 25 MG PO TABS
25.0000 mg | ORAL_TABLET | Freq: Two times a day (BID) | ORAL | Status: DC
Start: 2014-10-03 — End: 2014-10-04
  Administered 2014-10-03: 25 mg via ORAL
  Filled 2014-10-03 (×3): qty 1

## 2014-10-03 MED ORDER — ALUM & MAG HYDROXIDE-SIMETH 200-200-20 MG/5ML PO SUSP: 30.0000 mL | Freq: Four times a day (QID) | ORAL | Status: DC | PRN

## 2014-10-03 MED ORDER — ATENOLOL 25 MG PO TABS
25.0000 mg | ORAL_TABLET | Freq: Two times a day (BID) | ORAL | Status: DC
  Filled 2014-10-03: qty 1

## 2014-10-03 MED ORDER — LIDOCAINE HCL (CARDIAC) 20 MG/ML IV SOLN
INTRAVENOUS | Status: AC
  Filled 2014-10-03: qty 5

## 2014-10-03 MED ORDER — GLYCOPYRROLATE 0.2 MG/ML IJ SOLN
INTRAMUSCULAR | Status: AC
  Filled 2014-10-03: qty 3

## 2014-10-03 MED ORDER — HYOSCYAMINE SULFATE 0.125 MG SL SUBL
0.1250 mg | SUBLINGUAL_TABLET | SUBLINGUAL | Status: DC | PRN
  Filled 2014-10-03: qty 2

## 2014-10-03 MED ORDER — NEOSTIGMINE METHYLSULFATE 10 MG/10ML IV SOLN
INTRAVENOUS | Status: DC | PRN
  Administered 2014-10-03: 1 mg via INTRAVENOUS
  Administered 2014-10-03: 3 mg via INTRAVENOUS
  Administered 2014-10-03: 1 mg via INTRAVENOUS

## 2014-10-03 MED ORDER — DEXAMETHASONE SODIUM PHOSPHATE 4 MG/ML IJ SOLN
INTRAMUSCULAR | Status: DC | PRN
  Administered 2014-10-03: 8 mg via INTRAVENOUS

## 2014-10-03 MED ORDER — DEXTROSE 5 % IV SOLN
500.0000 mg | Freq: Four times a day (QID) | INTRAVENOUS | Status: DC | PRN
  Filled 2014-10-03: qty 5

## 2014-10-03 MED ORDER — POLYETHYLENE GLYCOL 3350 17 G PO PACK
17.0000 g | PACK | Freq: Every day | ORAL | Status: DC | PRN
  Filled 2014-10-03: qty 1

## 2014-10-03 MED ORDER — 0.9 % SODIUM CHLORIDE (POUR BTL) OPTIME
TOPICAL | Status: DC | PRN
  Administered 2014-10-03: 1000 mL

## 2014-10-03 MED ORDER — THROMBIN 5000 UNITS EX SOLR
CUTANEOUS | Status: DC | PRN
  Administered 2014-10-03 (×2): 5000 [IU] via TOPICAL

## 2014-10-03 MED ORDER — LORATADINE 10 MG PO TABS
10.0000 mg | ORAL_TABLET | Freq: Every day | ORAL | Status: DC
  Filled 2014-10-03: qty 1

## 2014-10-03 MED ORDER — PROPOFOL 10 MG/ML IV BOLUS
INTRAVENOUS | Status: AC
  Filled 2014-10-03: qty 20

## 2014-10-03 MED ORDER — ONDANSETRON HCL 4 MG/2ML IJ SOLN
INTRAMUSCULAR | Status: AC
  Filled 2014-10-03: qty 2

## 2014-10-03 MED ORDER — ROCURONIUM BROMIDE 50 MG/5ML IV SOLN
INTRAVENOUS | Status: AC
  Filled 2014-10-03: qty 1

## 2014-10-03 MED ORDER — ACETAMINOPHEN 325 MG PO TABS: 650.0000 mg | ORAL_TABLET | ORAL | Status: DC | PRN

## 2014-10-03 MED ORDER — PROPOFOL 10 MG/ML IV BOLUS
INTRAVENOUS | Status: DC | PRN
  Administered 2014-10-03: 150 mg via INTRAVENOUS

## 2014-10-03 MED ORDER — GLYCOPYRROLATE 0.2 MG/ML IJ SOLN
INTRAMUSCULAR | Status: DC | PRN
  Administered 2014-10-03: 0.4 mg via INTRAVENOUS
  Administered 2014-10-03: 0.2 mg via INTRAVENOUS

## 2014-10-03 MED ORDER — HEMOSTATIC AGENTS (NO CHARGE) OPTIME
TOPICAL | Status: DC | PRN
  Administered 2014-10-03: 1 via TOPICAL

## 2014-10-03 MED ORDER — MIDAZOLAM HCL 2 MG/2ML IJ SOLN
INTRAMUSCULAR | Status: AC
  Filled 2014-10-03: qty 2

## 2014-10-03 MED ORDER — ATORVASTATIN CALCIUM 40 MG PO TABS
40.0000 mg | ORAL_TABLET | Freq: Every day | ORAL | Status: DC
  Administered 2014-10-03: 40 mg via ORAL
  Filled 2014-10-03 (×2): qty 1

## 2014-10-03 MED ORDER — AMLODIPINE BESYLATE 2.5 MG PO TABS
2.5000 mg | ORAL_TABLET | Freq: Every day | ORAL | Status: DC
  Filled 2014-10-03 (×2): qty 1

## 2014-10-03 MED ORDER — ROCURONIUM BROMIDE 100 MG/10ML IV SOLN
INTRAVENOUS | Status: DC | PRN
  Administered 2014-10-03: 30 mg via INTRAVENOUS

## 2014-10-03 MED ORDER — LACTATED RINGERS IV SOLN
INTRAVENOUS | Status: DC | PRN
  Administered 2014-10-03 (×2): via INTRAVENOUS

## 2014-10-03 MED ORDER — METHOCARBAMOL 500 MG PO TABS
500.0000 mg | ORAL_TABLET | Freq: Three times a day (TID) | ORAL | Status: DC | PRN
  Administered 2014-10-03: 500 mg via ORAL
  Filled 2014-10-03: qty 1

## 2014-10-03 MED ORDER — BUPIVACAINE HCL (PF) 0.25 % IJ SOLN
INTRAMUSCULAR | Status: DC | PRN
  Administered 2014-10-03: 2.5 mL

## 2014-10-03 MED ORDER — FLUTICASONE PROPIONATE 50 MCG/ACT NA SUSP
2.0000 | Freq: Two times a day (BID) | NASAL | Status: DC
  Filled 2014-10-03: qty 16

## 2014-10-03 MED ORDER — PANTOPRAZOLE SODIUM 40 MG PO TBEC
80.0000 mg | DELAYED_RELEASE_TABLET | Freq: Every day | ORAL | Status: DC
  Administered 2014-10-03: 80 mg via ORAL
  Filled 2014-10-03: qty 2

## 2014-10-03 MED ORDER — FENTANYL CITRATE 0.05 MG/ML IJ SOLN
INTRAMUSCULAR | Status: AC
  Filled 2014-10-03: qty 5

## 2014-10-03 MED ORDER — SENNA 8.6 MG PO TABS
1.0000 | ORAL_TABLET | Freq: Two times a day (BID) | ORAL | Status: DC
  Administered 2014-10-03: 8.6 mg via ORAL
  Filled 2014-10-03 (×3): qty 1

## 2014-10-03 MED ORDER — OXYCODONE HCL 5 MG PO TABS
5.0000 mg | ORAL_TABLET | ORAL | Status: DC | PRN
  Administered 2014-10-03 – 2014-10-04 (×2): 5 mg via ORAL
  Filled 2014-10-03 (×3): qty 1

## 2014-10-03 MED ORDER — HYDROMORPHONE HCL 1 MG/ML IJ SOLN
INTRAMUSCULAR | Status: AC
  Filled 2014-10-03: qty 1

## 2014-10-03 MED ORDER — MIDAZOLAM HCL 5 MG/5ML IJ SOLN
INTRAMUSCULAR | Status: DC | PRN
  Administered 2014-10-03 (×2): 1 mg via INTRAVENOUS

## 2014-10-03 MED ORDER — FENTANYL CITRATE 0.05 MG/ML IJ SOLN
INTRAMUSCULAR | Status: DC | PRN
  Administered 2014-10-03 (×3): 50 ug via INTRAVENOUS

## 2014-10-03 MED ORDER — OXYCODONE-ACETAMINOPHEN 5-325 MG PO TABS: 1.0000 | ORAL_TABLET | ORAL | Status: DC | PRN

## 2014-10-03 MED ORDER — ARTIFICIAL TEARS OP OINT
TOPICAL_OINTMENT | OPHTHALMIC | Status: DC | PRN
  Administered 2014-10-03: 1 via OPHTHALMIC

## 2014-10-03 MED ORDER — LIDOCAINE HCL 4 % MT SOLN
OROMUCOSAL | Status: DC | PRN
  Administered 2014-10-03: 4 mL via TOPICAL

## 2014-10-03 MED ORDER — LIDOCAINE-EPINEPHRINE 1 %-1:100000 IJ SOLN
INTRAMUSCULAR | Status: DC | PRN
  Administered 2014-10-03: 2.5 mL

## 2014-10-03 MED ORDER — LIDOCAINE HCL (CARDIAC) 20 MG/ML IV SOLN
INTRAVENOUS | Status: DC | PRN
  Administered 2014-10-03: 80 mg via INTRAVENOUS

## 2014-10-03 MED ORDER — EPHEDRINE SULFATE 50 MG/ML IJ SOLN
INTRAMUSCULAR | Status: DC | PRN
  Administered 2014-10-03 (×2): 5 mg via INTRAVENOUS

## 2014-10-03 MED ORDER — METHOCARBAMOL 500 MG PO TABS
ORAL_TABLET | ORAL | Status: AC
  Filled 2014-10-03: qty 1

## 2014-10-03 MED ORDER — LEVOTHYROXINE SODIUM 112 MCG PO TABS
112.0000 ug | ORAL_TABLET | Freq: Every day | ORAL | Status: DC
  Administered 2014-10-04: 112 ug via ORAL
  Filled 2014-10-03 (×2): qty 1

## 2014-10-03 MED ORDER — PANTOPRAZOLE SODIUM 40 MG PO TBEC: 80.0000 mg | DELAYED_RELEASE_TABLET | Freq: Every day | ORAL | Status: DC

## 2014-10-03 MED ORDER — AMLODIPINE BESYLATE 2.5 MG PO TABS
2.5000 mg | ORAL_TABLET | Freq: Every day | ORAL | Status: DC
  Filled 2014-10-03: qty 1

## 2014-10-03 MED ORDER — AMLODIPINE BESYLATE 2.5 MG PO TABS: 2.5000 mg | ORAL_TABLET | Freq: Every day | ORAL | Status: DC

## 2014-10-03 MED ORDER — SODIUM CHLORIDE 0.9 % IJ SOLN: 3.0000 mL | INTRAMUSCULAR | Status: DC | PRN

## 2014-10-03 MED ORDER — ATENOLOL 25 MG PO TABS
25.0000 mg | ORAL_TABLET | Freq: Two times a day (BID) | ORAL | Status: DC
  Administered 2014-10-03: 25 mg via ORAL
  Filled 2014-10-03 (×3): qty 1

## 2014-10-03 MED ORDER — HYDROCODONE-ACETAMINOPHEN 5-325 MG PO TABS: 1.0000 | ORAL_TABLET | ORAL | Status: DC | PRN

## 2014-10-03 MED ORDER — ONDANSETRON HCL 4 MG/2ML IJ SOLN
INTRAMUSCULAR | Status: DC | PRN
  Administered 2014-10-03: 4 mg via INTRAVENOUS

## 2014-10-03 MED ORDER — ESTRADIOL 0.05 MG/24HR TD PTWK: 0.0500 mg | MEDICATED_PATCH | TRANSDERMAL | Status: DC

## 2014-10-03 MED ORDER — NEOSTIGMINE METHYLSULFATE 10 MG/10ML IV SOLN
INTRAVENOUS | Status: AC
  Filled 2014-10-03: qty 1

## 2014-10-03 MED ORDER — DOCUSATE SODIUM 100 MG PO CAPS
100.0000 mg | ORAL_CAPSULE | Freq: Two times a day (BID) | ORAL | Status: DC
  Filled 2014-10-03 (×3): qty 1

## 2014-10-03 SURGICAL SUPPLY — 56 items
ALLOGRAFT LORDOTIC CC 7X11X14 (Bone Implant) ×2 IMPLANT
BAG DECANTER FOR FLEXI CONT (MISCELLANEOUS) ×3 IMPLANT
BIT DRILL NEURO 2X3.1 SFT TUCH (MISCELLANEOUS) ×1 IMPLANT
BNDG GAUZE ELAST 4 BULKY (GAUZE/BANDAGES/DRESSINGS) ×4 IMPLANT
BUR BARREL STRAIGHT FLUTE 4.0 (BURR) ×3 IMPLANT
CANISTER SUCT 3000ML (MISCELLANEOUS) ×3 IMPLANT
CONT SPEC 4OZ CLIKSEAL STRL BL (MISCELLANEOUS) ×4 IMPLANT
DECANTER SPIKE VIAL GLASS SM (MISCELLANEOUS) ×3 IMPLANT
DRAPE LAPAROTOMY 100X72 PEDS (DRAPES) ×3 IMPLANT
DRAPE MICROSCOPE LEICA (MISCELLANEOUS) IMPLANT
DRAPE POUCH INSTRU U-SHP 10X18 (DRAPES) ×3 IMPLANT
DRILL NEURO 2X3.1 SOFT TOUCH (MISCELLANEOUS) ×3
DRSG OPSITE 4X5.5 SM (GAUZE/BANDAGES/DRESSINGS) ×1 IMPLANT
DRSG TELFA 3X8 NADH (GAUZE/BANDAGES/DRESSINGS) IMPLANT
DURAPREP 6ML APPLICATOR 50/CS (WOUND CARE) ×3 IMPLANT
ELECT REM PT RETURN 9FT ADLT (ELECTROSURGICAL) ×3
ELECTRODE REM PT RTRN 9FT ADLT (ELECTROSURGICAL) ×1 IMPLANT
GAUZE SPONGE 4X4 16PLY XRAY LF (GAUZE/BANDAGES/DRESSINGS) IMPLANT
GLOVE BIO SURGEON STRL SZ7.5 (GLOVE) IMPLANT
GLOVE BIOGEL PI IND STRL 7.5 (GLOVE) IMPLANT
GLOVE BIOGEL PI IND STRL 8.5 (GLOVE) ×1 IMPLANT
GLOVE BIOGEL PI INDICATOR 7.5 (GLOVE) ×2
GLOVE BIOGEL PI INDICATOR 8.5 (GLOVE) ×2
GLOVE ECLIPSE 7.0 STRL STRAW (GLOVE) ×2 IMPLANT
GLOVE ECLIPSE 8.5 STRL (GLOVE) ×3 IMPLANT
GLOVE EXAM NITRILE LRG STRL (GLOVE) IMPLANT
GLOVE EXAM NITRILE MD LF STRL (GLOVE) IMPLANT
GLOVE EXAM NITRILE XL STR (GLOVE) IMPLANT
GLOVE EXAM NITRILE XS STR PU (GLOVE) IMPLANT
GOWN STRL REUS W/ TWL LRG LVL3 (GOWN DISPOSABLE) IMPLANT
GOWN STRL REUS W/ TWL XL LVL3 (GOWN DISPOSABLE) ×1 IMPLANT
GOWN STRL REUS W/TWL 2XL LVL3 (GOWN DISPOSABLE) ×3 IMPLANT
GOWN STRL REUS W/TWL LRG LVL3 (GOWN DISPOSABLE) ×3
GOWN STRL REUS W/TWL XL LVL3 (GOWN DISPOSABLE) ×6
HALTER HD/CHIN CERV TRACTION D (MISCELLANEOUS) ×3 IMPLANT
KIT BASIN OR (CUSTOM PROCEDURE TRAY) ×3 IMPLANT
KIT ROOM TURNOVER OR (KITS) ×3 IMPLANT
LIQUID BAND (GAUZE/BANDAGES/DRESSINGS) ×3 IMPLANT
NDL SPNL 22GX3.5 QUINCKE BK (NEEDLE) ×1 IMPLANT
NEEDLE HYPO 22GX1.5 SAFETY (NEEDLE) ×3 IMPLANT
NEEDLE SPNL 22GX3.5 QUINCKE BK (NEEDLE) ×3 IMPLANT
NS IRRIG 1000ML POUR BTL (IV SOLUTION) ×3 IMPLANT
PACK LAMINECTOMY NEURO (CUSTOM PROCEDURE TRAY) ×3 IMPLANT
PAD ARMBOARD 7.5X6 YLW CONV (MISCELLANEOUS) ×9 IMPLANT
PAD DRESSING TELFA 3X8 NADH (GAUZE/BANDAGES/DRESSINGS) ×1 IMPLANT
PLATE ARCHON 1-LEVEL 22MM (Plate) ×2 IMPLANT
RUBBERBAND STERILE (MISCELLANEOUS) IMPLANT
SCREW ARCHON SELFTAP 4.0X13 (Screw) ×8 IMPLANT
SPONGE INTESTINAL PEANUT (DISPOSABLE) ×3 IMPLANT
SPONGE SURGIFOAM ABS GEL SZ50 (HEMOSTASIS) ×3 IMPLANT
SUT VIC AB 3-0 SH 8-18 (SUTURE) ×3 IMPLANT
SYR 20ML ECCENTRIC (SYRINGE) ×3 IMPLANT
SYR BULB IRRIGATION 50ML (SYRINGE) ×2 IMPLANT
TOWEL OR 17X24 6PK STRL BLUE (TOWEL DISPOSABLE) ×3 IMPLANT
TOWEL OR 17X26 10 PK STRL BLUE (TOWEL DISPOSABLE) ×3 IMPLANT
WATER STERILE IRR 1000ML POUR (IV SOLUTION) ×3 IMPLANT

## 2014-10-03 NOTE — Anesthesia Postprocedure Evaluation (Signed)
  Anesthesia Post-op Note  Patient: Jenna [name]  Procedure(s) Performed: Procedure(s): ANTERIOR CERVICAL DECOMPRESSION/DISCECTOMY FUSION 1 LEVEL,CERVICAL THREE-FOUR (N/A)  Patient Location: PACU  Anesthesia Type:General  Level of Consciousness: awake and alert   Airway and Oxygen Therapy: Patient Spontanous Breathing  Post-op Pain: mild  Post-op Assessment: Post-op Vital signs reviewed, Patient's Cardiovascular Status Stable and Respiratory Function Stable  Post-op Vital Signs: stable  Last Vitals:  Filed Vitals:   10/03/14 1237  BP: 144/61  Pulse: 61  Temp: 37.1 C  Resp: 15    Complications: No apparent anesthesia complications

## 2014-10-03 NOTE — Anesthesia Preprocedure Evaluation (Addendum)
Anesthesia Evaluation  Patient identified by MRN, date of birth, ID band  Reviewed: Allergy & Precautions, H&P , NPO status , Patient's Chart, lab work & pertinent test results, reviewed documented beta blocker date and time   Airway Mallampati: I  TM Distance: <3 FB Neck ROM: Full    Dental  (+) Teeth Intact, Dental Advisory Given   Pulmonary former smoker,  breath sounds clear to auscultation        Cardiovascular hypertension, + dysrhythmias Rhythm:Regular Rate:Normal     Neuro/Psych  Neuromuscular disease    GI/Hepatic negative GI ROS, Neg liver ROS,   Endo/Other  negative endocrine ROS  Renal/GU negative Renal ROS     Musculoskeletal  (+) Arthritis -,   Abdominal   Peds  Hematology   Anesthesia Other Findings   Reproductive/Obstetrics                           Anesthesia Physical Anesthesia Plan  ASA: II  Anesthesia Plan: General   Post-op Pain Management:    Induction: Intravenous  Airway Management Planned: Oral ETT  Additional Equipment:   Intra-op Plan:   Post-operative Plan: Extubation in OR  Informed Consent: I have reviewed the patients History and Physical, chart, labs and discussed the procedure including the risks, benefits and alternatives for the proposed anesthesia with the patient or authorized representative who has indicated his/her understanding and acceptance.   Dental advisory given  Plan Discussed with: CRNA and Surgeon  Anesthesia Plan Comments:        Anesthesia Quick Evaluation

## 2014-10-03 NOTE — Progress Notes (Signed)
Patient ID: Jenna [name], female   DOB: 1933-07-30, 80 y.o.   MRN: [id number] Vital signs are stable. Motor function is good in both upper extremities station and gait is intact. Patient will remain overnight.

## 2014-10-03 NOTE — Progress Notes (Signed)
Pt stating she is very short of breath, pts IV infiltrated, pt given more reversal at bedside per CRNA, lungs clear, does not appear to be SOB, sats 98-100

## 2014-10-03 NOTE — Op Note (Signed)
Date of surgery: 10/03/2014 Preoperative diagnosis: Cervical spondylosis and herniated nucleus pulposus with spondylolisthesis C3-4, myelopathy Postoperative diagnosis: Cervical spondylosis and herniated nucleus pulposus with spondylolisthesis C3-4, myelopathy Procedure: Anterior cervical decompression C3-C4 arthrodesis with structural allograft anterior plate fixation with nuvasive plate C3-C4. Surgeon: [name] Asst.:Neelesh Kathyrn Sheriff M.D. Anesthesia: Gen. endotracheal Indications: Jenna [name] injured is a 80 year old individual who's had some progressive weakness in her left and right hands in addition to neck and shoulder pain on the left side. She has had advancing spondylosis and now has cord compression at the level of C3-C4 secondary to degeneration of the disc with central herniation in addition to a spondylolisthesis that is formed at C3-C4.  Procedure: The patient was brought to the operating room supine on a stretcher. She is placed onto the operating table in the supine position. After the smooth induction of general endotracheal anesthesia, she was placed in 5 pounds of halter traction. The neck was prepped and draped with alcohol and DuraPrep and then sterile draping was performed. Transverse incision was made in the left side of the neck and carried down through the platysma. The plane between the sternocleidomastoid and strap muscles dissected bluntly until the first prevertebral space was reached. This was identified positively at C3-4. Then the dissection was continued to expose the longus coli muscle on either side of midline. A self-retaining retractor was placed in the wound. The anterior border of the disc was then opened with a 15 blade and a combination of curettes and rongeurs was used to remove ventral osteophytes in addition to markedly degenerated and desiccated disc material. The disc space was then opened further with a high-speed drill and 2 mm drill bit. Substantial  osteophytes in the lateral recesses from the uncinate spurs were drilled down. The lateral recesses were then decompressed. Disc material and redundant ligamentous material was decompressed centrally and the dissection was carried down to the dura. Once this area was decompressed from side to side hemostasis from substantial bleeding epidural veins was obtained using some small pledgets of Gelfoam which were later irrigated away. A 7 mm bone graft was sized appropriately and then fitted into the interspace. Traction was removed. 20 mm anterior plate was then fitted to the ventral aspect of the vertebral bodies and secured with 4 variable angle 13 mm screws. Hemostasis was then checked meticulously and the wound a final radio graph identified good position of the hardware. Wound was then closed with 3-0 Vicryl in the platysma and 3-0 Vicryl subcutaneous take her tissues. Dermabond was used on the skin. Blood loss was estimated about 50 mL.

## 2014-10-03 NOTE — Progress Notes (Signed)
pts IV infiltrated and red in left arm, pts IV dced and hot pack applied

## 2014-10-03 NOTE — Progress Notes (Signed)
Utilization review completed.  

## 2014-10-03 NOTE — Transfer of Care (Signed)
Immediate Anesthesia Transfer of Care Note  Patient: Jenna [name]  Procedure(s) Performed: Procedure(s): ANTERIOR CERVICAL DECOMPRESSION/DISCECTOMY FUSION 1 LEVEL,CERVICAL THREE-FOUR (N/A)  Patient Location: PACU  Anesthesia Type:General  Level of Consciousness: awake and alert   Airway & Oxygen Therapy: Patient Spontanous Breathing and Patient connected to nasal cannula oxygen  Post-op Assessment: Report given to PACU RN, Post -op Vital signs reviewed and stable and Patient moving all extremities  Post vital signs: Reviewed and stable  Complications: No apparent anesthesia complications.  Patient feels subjective shortness of breath.  Taking deep breaths and sats 100%.  Left PIV d/c'd due to redness and possible infiltration.  Elevated and heat pack applied.

## 2014-10-03 NOTE — H&P (Signed)
Jenna [name] is an 80 y.o. female.   Chief Complaint: Neck shoulder pain weakness in the right arm HPI: Patient is a 80 year old individual who's had significant cervical spondylosis for a number of years. In 2011 she underwent an MRI that demonstrated diffuse spondylosis without cord compression. She has had increasing problems with her penmanship in the right hand and intermittent neck pain. An MRI demonstrates now that she has evidence of advanced spondylosis with severe stenosis at C3-C4 with a canal that measures 5 mm. This is a substantial change from her films 4 years ago which demonstrated that she had an 38mm canal at C3-4 no spondylolisthesis. He's been advised regarding the need for surgical intervention. She is now admitted for an anterior decompression at C3-4.  Past Medical History  Diagnosis Date  . Lumbosacral spondylosis without myelopathy   . Pain in joint, upper arm   . Other chronic postoperative pain   . Spinal stenosis, lumbar region, without neurogenic claudication   . Osteoarthritis   . Thyroid disease     hypothyroidism  . Hypercholesteremia   . Cervical spinal stenosis   . Hypertension   . 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 Swallowing dysfunction 2015    especially on thin liquids, study done 01/2014  . Dysrhythmia     (?or extra beats) treated /w atenolol  . Pneumonia   . Neuromuscular disorder     esophageal spasms at times   . Melanoma     on left leg, wide excision   . History of blood transfusion     Past Surgical History  Procedure Laterality Date  . Rotator cuff repair Left 1996; 1981  . Cholecystectomy    . Total hip arthroplasty Left 10/2000  . Total hip arthroplasty Right 06/2005  . Rotator cuff repair Right 1996  . Vaginal delivery      x1  . Rotator cuff  Bilateral 1996/1998    repair  . Lipoma excision Left 10/1999    left thigh  . Total hip arthroplasty Left     10/2000  . Lung biopsy   05/2001    /w Dr.Burney- bronchoscopy, mediastinoscopy  . Lumbosacral cyst  2005    seen on injection for low back pain   . Appendectomy  1986    along with chole  . Breast biopsy Right 1997    sclerosing adenosis -Dr. Lucia Gaskins  . Abdominal hysterectomy  1982    BSO  . Tonsillectomy      Family History  Problem Relation Age of Onset  . Stroke Mother   . Heart attack Father   . Diabetes Brother   . Heart disease Brother    Social History:  reports that she quit smoking about 40 years ago. She has never used smokeless tobacco. She reports that she drinks about 3.0 oz of alcohol per week. She reports that she does not use illicit drugs.  Allergies:  Allergies  Allergen Reactions  . Codeine Shortness Of Breath  . Darvon Shortness Of Breath  . Pseudoephedrine Hcl Shortness Of Breath  . Tylenol [Acetaminophen] Shortness Of Breath and Palpitations  . Ultram [Tramadol] Shortness Of Breath  . Vicodin [Hydrocodone-Acetaminophen] Shortness Of Breath  . Dalmane [Flurazepam Hcl] Other (See Comments)    confusion  . Cephalexin Nausea Only  . Clarithromycin Nausea Only  . Doxycycline Calcium Rash    Medications Prior to Admission  Medication Sig Dispense Refill  . amLODipine (NORVASC)  2.5 MG tablet Take 2.5 mg by mouth daily.    [name] aspirin 81 MG tablet Take 81 mg by mouth daily.     [name] atenolol (TENORMIN) 25 MG tablet Take 25 mg by mouth 2 (two) times daily.     [name] atorvastatin (LIPITOR) 80 MG tablet Take 40 mg by mouth daily.    . Biotin 2500 MCG CAPS Take 2,500 mcg by mouth daily.    . calcium carbonate (OS-CAL) 600 MG TABS Take 600 mg by mouth 2 (two) times daily.    . cholecalciferol (VITAMIN D) 1000 UNITS tablet Take 1,000 Units by mouth daily.    [name] conjugated estrogens (PREMARIN) vaginal cream Use 1/2 g vaginally twice weekly (Patient taking differently: Place 1 Applicatorful vaginally every Sunday. Use 1/2 g vaginally) 42.5 g 3  . estradiol (VIVELLE-DOT) 0.025 MG/24HR Use 1 patch  twice weekly (Patient taking differently: Place 1 patch onto the skin every Sunday. ) 24 patch 3  . fexofenadine (ALLEGRA) 180 MG tablet Take 180 mg by mouth daily.    . fluticasone (FLONASE) 50 MCG/ACT nasal spray Place 2 sprays into both nostrils 2 (two) times daily.    . hyoscyamine (LEVSIN SL) 0.125 MG SL tablet Place 0.125-0.25 mg under the tongue every 4 (four) hours as needed.     [name] ibuprofen (ADVIL,MOTRIN) 200 MG tablet Take 400 mg by mouth every 6 (six) hours as needed (for pain).     [name] levothyroxine (SYNTHROID, LEVOTHROID) 112 MCG tablet Take 112 mcg by mouth daily before breakfast.    . meperidine (DEMEROL) 50 MG tablet Take 1 tablet (50 mg total) by mouth 2 (two) times daily. 60 tablet 0  . Multiple Vitamin (MULTIVITAMIN) tablet Take 1 tablet by mouth daily.    [name] omeprazole (PRILOSEC) 40 MG capsule Take 40 mg by mouth 2 (two) times daily.     . promethazine (PHENERGAN) 25 MG tablet TAKE 1 TABLET BY MOUTH TWICE DAILY 60 tablet 2  . clidinium-chlordiazePOXIDE (LIBRAX) 5-2.5 MG per capsule Take 1 capsule by mouth 3 (three) times daily as needed (for IBS).    [name] methocarbamol (ROBAXIN) 500 MG tablet Take 0.5 tablets (250 mg total) by mouth 3 (three) times daily as needed. (Patient not taking: Reported on 09/19/2014) 45 tablet 2    No results found for this or any previous visit (from the past 48 hour(s)). No results found.  Review of Systems  Eyes: Negative.   Respiratory: Negative.   Cardiovascular: Negative.   Gastrointestinal: Negative.   Genitourinary: Negative.   Musculoskeletal: Positive for neck pain.  Skin: Negative.   Neurological: Positive for sensory change, focal weakness and weakness.  Endo/Heme/Allergies: Negative.   Psychiatric/Behavioral: Negative.     Blood pressure 155/63, pulse 66, temperature 98.7 F (37.1 C), temperature source Oral, resp. rate 18, weight 68.04 kg (150 lb), last menstrual period 11/03/1980, SpO2 99 %. Physical Exam  Constitutional: She is  oriented to person, place, and time. She appears well-developed and well-nourished.  HENT:  Head: Normocephalic and atraumatic.  Eyes: Conjunctivae and EOM are normal. Pupils are equal, round, and reactive to light.  Neck: Normal range of motion. Neck supple.  Cardiovascular: Normal rate and regular rhythm.   Respiratory: Effort normal and breath sounds normal.  GI: Soft. Bowel sounds are normal.  Musculoskeletal: Normal range of motion.  Neurological: She is alert and oriented to person, place, and time. She has normal reflexes.  Motor function in upper extremities reveals good strength to confrontation deltoid bicep tricep  grip and intrinsics the patient has a however noted deterioration in her fine motor coordination involving the right upper extremity. It is felt to represent some myelopathic changes from her cord compression at C3-4. Her station and gait however have remained stable.  Skin: Skin is dry.  Psychiatric: She has a normal mood and affect. Her behavior is normal. Judgment and thought content normal.     Assessment/Plan Spondylosis with stenosis and myelopathy C3-4.  Anterior cervical decompression C3-4.  Fumi Guadron J 10/03/2014, 9:41 AM

## 2014-10-03 NOTE — Anesthesia Procedure Notes (Signed)
Procedure Name: Intubation Date/Time: 10/03/2014 10:03 AM Performed by: Suzy Bouchard Pre-anesthesia Checklist: Patient identified, Emergency Drugs available, Suction available, Patient being monitored and Timeout performed Patient Re-evaluated:Patient Re-evaluated prior to inductionOxygen Delivery Method: Circle system utilized Preoxygenation: Pre-oxygenation with 100% oxygen Intubation Type: IV induction Ventilation: Mask ventilation without difficulty Laryngoscope Size: Miller and 1 Grade View: Grade I Tube type: Oral Tube size: 7.0 mm Number of attempts: 1 Placement Confirmation: ETT inserted through vocal cords under direct vision,  positive ETCO2 and breath sounds checked- equal and bilateral Secured at: 21 cm Tube secured with: Tape Dental Injury: Teeth and Oropharynx as per pre-operative assessment

## 2014-10-03 NOTE — Evaluation (Addendum)
Occupational Therapy Evaluation Patient Details Name: Jenna [name] MRN: [id number] DOB: 1933-09-04 Today's Date: 10/03/2014    History of Present Illness 79 y.o. s/p ANTERIOR CERVICAL DECOMPRESSION/DISCECTOMY FUSION 1 LEVEL,CERVICAL THREE-FOUR.   Clinical Impression   Pt s/p above. Pt independent with ADLs, PTA. Feel pt will benefit from acute OT to increase independence with BADLs prior to d/c and to address RUE decreased strength/coordination.    Follow Up Recommendations  No OT follow up;Supervision - Intermittent    Equipment Recommendations  None recommended by OT    Recommendations for Other Services       Precautions / Restrictions Precautions Precautions: Cervical;Fall (no extreme neck movements) Precaution Comments: educated on precautions Restrictions Weight Bearing Restrictions: No      Mobility Bed Mobility Overal bed mobility: Needs Assistance Bed Mobility: Rolling;Sidelying to Sit;Sit to Sidelying Rolling: Supervision Sidelying to sit: Modified independent (Device/Increase time)     Sit to sidelying: Min guard General bed mobility comments: cues for log roll technique.  Transfers Overall transfer level: Needs assistance   Transfers: Sit to/from Stand Sit to Stand: Min guard         General transfer comment: Min guard for safety    Balance                                            ADL Overall ADL's : Needs assistance/impaired     Grooming: Wash/dry hands;Set up;Supervision/safety;Standing               Lower Body Dressing: Sit to/from stand;Minimal assistance   Toilet Transfer: Min guard;Ambulation;Regular Toilet;Grab bars   Toileting- Clothing Manipulation and Hygiene: Min guard;Sit to/from stand       Functional mobility during ADLs: Min guard General ADL Comments: Discussed incorporating cervical precautions with functional activities/ADLs. Educated on fine motor coordination activities/exercises for  right hand. Educated on Stage manager (rugs, pet, recommended spouse be with pt for shower transfer). Initially pt having difficult time crossing one leg over knee, OT assisted with donning panties. Later, pt did better with crossing left leg over knee. Educated on AE.  Educated on use of cup for oral care and using straw for liquids.     Vision                     Perception     Praxis      Pertinent Vitals/Pain Pain Assessment: Faces Faces Pain Scale: Hurts a little bit Pain Location: neck Pain Intervention(s): Monitored during session     Hand Dominance Right   Extremity/Trunk Assessment Upper Extremity Assessment Upper Extremity Assessment: RUE deficits/detail;LUE deficits/detail RUE Deficits / Details: weakness in Rt shoulder-able to almost achieve full shoulder AROM in supine. Previous rotator cuff injury  RUE Coordination: decreased fine motor LUE Deficits / Details: limited ROM due to rotator cuff injury in past   Lower Extremity Assessment Lower Extremity Assessment: Defer to PT evaluation       Communication Communication Communication: No difficulties   Cognition Arousal/Alertness: Awake/alert Behavior During Therapy: WFL for tasks assessed/performed Overall Cognitive Status: Within Functional Limits for tasks assessed                     General Comments       Exercises       Shoulder Instructions      Home Living Family/patient expects to be  discharged to:: Private residence Living Arrangements: Spouse/significant other Available Help at Discharge: Family;Available 24 hours/day Type of Home: House Home Access: Stairs to enter [employer] of Steps: 5 Entrance Stairs-Rails: Right;Left;Can reach both Home Layout: One level     Bathroom Shower/Tub: Walk-in shower         Home Equipment: Environmental consultant - 2 wheels;Cane - single point;Shower seat - built Investment banker, operational (has a stool she can use for shower chair) Adaptive  Equipment: Reacher        Prior Functioning/Environment Level of Independence: Independent             OT Diagnosis: Acute pain   OT Problem List:     OT Treatment/Interventions: Self-care/ADL training;DME and/or AE instruction;Therapeutic activities;Patient/family education;Balance training;Therapeutic exercise    OT Goals(Current goals can be found in the care plan section) Acute Rehab OT Goals Patient Stated Goal: not stated OT Goal Formulation: With patient Time For Goal Achievement: 10/10/14 Potential to Achieve Goals: Good ADL Goals Pt Will Perform Lower Body Dressing: with modified independence;with caregiver independent in assisting;sit to/from stand Pt Will Transfer to Toilet: with modified independence;ambulating Additional ADL Goal #1: Pt will be independent with HEP for RUE to increase strength/coordination.  OT Frequency: Min 2X/week   Barriers to D/C:            Co-evaluation              End of Session Equipment Utilized During Treatment: Gait belt Nurse Communication: Other (comment) (notified tech that pt wanted nausea meds)  Activity Tolerance: Other (comment) (nauseous) Patient left: in bed;with call bell/phone within reach;with family/visitor present   Time: [phone] (approximately 7 minutes on toilet trying to urinate) OT Time Calculation (min): 32 min Charges:  OT General Charges $OT Visit: 1 Procedure OT Evaluation $Initial OT Evaluation Tier I: 1 Procedure OT Treatments $Self Care/Home Management : 8-22 mins G-Codes: OT G-codes **NOT FOR INPATIENT CLASS** Functional Assessment Tool Used: clinical judgment Functional Limitation: Self care Self Care Current Status (E0814): At least 1 percent but less than 20 percent impaired, limited or restricted Self Care Goal Status (G8185): 0 percent impaired, limited or restricted  Benito Mccreedy OTR/L [phone] 10/03/2014, 4:11 PM

## 2014-10-04 ENCOUNTER — Encounter (HOSPITAL_COMMUNITY): Payer: Self-pay | Admitting: Neurological Surgery

## 2014-10-04 DIAGNOSIS — M4712 Other spondylosis with myelopathy, cervical region: Secondary | ICD-10-CM | POA: Diagnosis not present

## 2014-10-04 MED ORDER — MEPERIDINE HCL 50 MG PO TABS: 50.0000 mg | ORAL_TABLET | ORAL | Status: DC | PRN

## 2014-10-04 MED ORDER — METHOCARBAMOL 500 MG PO TABS: 500.0000 mg | ORAL_TABLET | Freq: Four times a day (QID) | ORAL | Status: DC | PRN

## 2014-10-04 NOTE — Progress Notes (Signed)
Pt and husband given D/C instructions with Rx's, verbal understanding was provided. Pt's incision was clean and dry with no sign of infection. Pt's IV was removed prior to D/C. Pt D/C'd home via wheelchair per @ 1110 per MD order. Pt is stable @ D/C and has no other needs at this time. Holli Humbles, RN

## 2014-10-04 NOTE — Evaluation (Signed)
Physical Therapy Evaluation Patient Details Name: Jenna [name] MRN: [id number] DOB: 1932-11-26 Today's Date: 10/04/2014   History of Present Illness  80 y.o. s/p ANTERIOR CERVICAL DECOMPRESSION/DISCECTOMY FUSION 1 LEVEL,CERVICAL THREE-FOUR.  Clinical Impression  Pt is mobilizing well at supervision level, which she has at home with her husband. Required HHA for safe ambulation, recommended pt use cane upon initial return home. No further acute PT needs.     Follow Up Recommendations No PT follow up    Equipment Recommendations  None recommended by PT    Recommendations for Other Services       Precautions / Restrictions Precautions Precautions: Cervical;Fall (no extreme neck movements) Precaution Comments: educated on precautions Restrictions Weight Bearing Restrictions: No      Mobility  Bed Mobility Overal bed mobility: Modified Independent Bed Mobility: Rolling;Sidelying to Sit;Sit to Sidelying Rolling: Modified independent (Device/Increase time) Sidelying to sit: Modified independent (Device/Increase time)     Sit to sidelying: Modified independent (Device/Increase time) General bed mobility comments: not assessed  Transfers Overall transfer level: Needs assistance Equipment used: None Transfers: Sit to/from Stand Sit to Stand: Supervision            Ambulation/Gait Ambulation/Gait assistance: Supervision Ambulation Distance (Feet): 175 Feet Assistive device: 1 person hand held assist Gait Pattern/deviations: Step-through pattern;Decreased weight shift to left Gait velocity: decreased, guarded   General Gait Details: instructed pt to use cane upon initial return home, used HHA  on left side on eval. No LOB, fatigue with increased distance.  Stairs Stairs: Yes Stairs assistance: Supervision Stair Management: Two rails;Step to pattern;Forwards Number of Stairs: 5 General stair comments: cues for up with right and down with left due to weakness left  knee. Pt required use of both rails. Will have sufficient supervision of hudband at home  Wheelchair Mobility    Modified Rankin (Stroke Patients Only)       Balance Overall balance assessment: Needs assistance Sitting-balance support: No upper extremity supported;Feet supported Sitting balance-Leahy Scale: Good     Standing balance support: No upper extremity supported;During functional activity Standing balance-Leahy Scale: Fair Standing balance comment: requires UE support for dynamic activity                             Pertinent Vitals/Pain Pain Assessment: 0-10 Pain Score: 5  Faces Pain Scale: Hurts a little bit Pain Location: neck Pain Intervention(s): Monitored during session    Home Living Family/patient expects to be discharged to:: Private residence Living Arrangements: Spouse/significant other Available Help at Discharge: Family;Available 24 hours/day Type of Home: House Home Access: Stairs to enter Entrance Stairs-Rails: Right;Left;Can reach both Entrance Stairs-Number of Steps: 5 Home Layout: One level Home Equipment: Walker - 2 wheels;Cane - single point;Shower seat - built Investment banker, operational      Prior Function Level of Independence: Independent               Hand Dominance   Dominant Hand: Right    Extremity/Trunk Assessment   Upper Extremity Assessment: Defer to OT evaluation           Lower Extremity Assessment: LLE deficits/detail   LLE Deficits / Details: 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 Pt received handout yesterday.      Assessment/Plan    PT Assessment Patent does not need any further PT services  PT Diagnosis Abnormality of gait;Acute pain   PT Problem List    PT Treatment Interventions     PT Goals (Current goals can be found in the Care Plan section) Acute Rehab PT Goals Patient Stated Goal: not stated PT Goal Formulation: All assessment and education complete, DC therapy    Frequency     Barriers to discharge        Co-evaluation               End of Session   Activity Tolerance: Patient tolerated treatment well Patient left: in bed;with call bell/phone within reach;with family/visitor present Nurse Communication: Mobility status    Functional Assessment Tool Used: clinical judgement Functional Limitation: Mobility: Walking and moving around Mobility: Walking and Moving Around Current Status (C3762): At least 1 percent but less than 20 percent impaired, limited or restricted Mobility: Walking and Moving Around Goal Status [phone]): At least 1 percent but less than 20 percent impaired, limited or restricted Mobility: Walking and Moving Around Discharge Status [phone]): At least 1 percent but less than 20 percent impaired, limited or restricted    Time: 1013-1030 PT Time Calculation (min) (ACUTE ONLY): 17 min   Charges:   PT Evaluation $Initial PT Evaluation Tier I: 1 Procedure PT Treatments $Gait Training: 8-22 mins   PT G Codes:   Functional Assessment Tool Used: clinical judgement Functional Limitation: Mobility: Walking and moving around  [facility], [place]   Leighton Roach 10/04/2014, 12:24 PM

## 2014-10-04 NOTE — Progress Notes (Signed)
Occupational Therapy Treatment Patient Details Name: Jenna [name] MRN: [id number] DOB: 03/06/1933 Today's Date: 10/04/2014    History of present illness 80 y.o. s/p ANTERIOR CERVICAL DECOMPRESSION/DISCECTOMY FUSION 1 LEVEL,CERVICAL THREE-FOUR.   OT comments  Education provided on fine motor activities. Pt performed ADLs in session.   Follow Up Recommendations  No OT follow up;Supervision - Intermittent    Equipment Recommendations  None recommended by OT    Recommendations for Other Services      Precautions / Restrictions Precautions Precautions: Cervical;Fall (no extreme neck movements) Precaution Comments: educated on precautions Restrictions Weight Bearing Restrictions: No       Mobility Bed Mobility  General bed mobility comments: not assessed  Transfers Overall transfer level: Needs assistance Equipment used: None Transfers: Sit to/from Stand Sit to Stand: Supervision              Balance                                   ADL Overall ADL's : Needs assistance/impaired                 Upper Body Dressing : Set up;Supervision/safety;Sitting   Lower Body Dressing: Sit to/from stand;Minimal assistance   Toilet Transfer: Supervision/safety (sit <> stand to/from bed)             General ADL Comments: Reviewed safety tips with pt/spouse and limiting neck motion. Pt performed UB/LB dressing. Discussed types of UB clothing that will probably be easier to manage and limit neck motion. Recommended spouse be with pt for shower transfer.      Vision                     Perception     Praxis      Cognition  Awake/Alert Behavior During Therapy: WFL for tasks assessed/performed Overall Cognitive Status: Within Functional Limits for tasks assessed                       Extremity/Trunk Assessment               Exercises Other Exercises Other Exercises: performed fine motor activities with both hands  (left hand appeared to have decreased fine motor coordination today as well). Pt received handout yesterday. Strength WFL in hands.   Shoulder Instructions       General Comments      Pertinent Vitals/ Pain       Pain Assessment: 0-10 Pain Score: 5  Pain Location: neck Pain Intervention(s): Monitored during session  Home Living                                          Prior Functioning/Environment              Frequency Min 2X/week     Progress Toward Goals  OT Goals(current goals can now be found in the care plan section)  Progress towards OT goals: Progressing toward goals  Acute Rehab OT Goals Patient Stated Goal: not stated OT Goal Formulation: With patient Time For Goal Achievement: 10/10/14 Potential to Achieve Goals: Good ADL Goals Pt Will Perform Lower Body Dressing: with modified independence;with caregiver independent in assisting;sit to/from stand Pt Will Transfer to Toilet: with modified independence;ambulating Additional ADL Goal #1: Pt will be independent  with HEP for RUE to increase strength/coordination.  Plan Discharge plan remains appropriate    Co-evaluation                 End of Session     Activity Tolerance Patient tolerated treatment well   Patient Left in bed;with family/visitor present   Nurse Communication          Time: 7356-7014 OT Time Calculation (min): 16 min  Charges: OT General Charges $OT Visit: 1 Procedure OT Treatments $Self Care/Home Management : 8-22 mins  Benito Mccreedy OTR/L [phone] 10/04/2014, 12:19 PM

## 2014-10-04 NOTE — Discharge Instructions (Signed)
Wound Care Leave incision open to air. You may shower. Do not scrub directly on incision.  Do not put any creams, lotions, or ointments on incision. Activity Walk each and every day, increasing distance each day. No lifting greater than 5 lbs.  Avoid excessive neck motion. No driving for 2 weeks; may ride as a passenger locally. Wear neck brace at all times except when showering.  If provided soft collar, may wear for comfort unless otherwise instructed. Diet Resume your normal diet.  Return to Work Will be discussed at you follow up appointment. Call Your Doctor If Any of These Occur Redness, drainage, or swelling at the wound.  Temperature greater than 101 degrees. Severe pain not relieved by pain medication. Increased difficulty swallowing. Incision starts to come apart. Follow Up Appt Call today for appointment in 4 weeks ([phone]) or for problems.  If you have any hardware placed in your spine, you will need an x-ray before your appointment.    Anterior and Posterior Decompressions with Fusions Decompression is a procedure performed to relieve symptoms caused by pressure or compression on the spinal cord and nerve roots. The spinal cord is a cord of nervous tissue. It extends from the brain and passes through the spinal canal (passage formed by the openings in the backbone). Nerves branch out from the spinal cord to different parts of the body. Vertebrae (backbones) are a group of individual bones. They form the spinal column. Each vertebra is made up of lamina (bony arches of the spinal canal), spine, and foramen (opening in the backbone). A disc is a soft, gel-like cushion between each vertebra.  Decompression can be carried out as an anterior or posterior procedure. Anterior decompression is where the surgeon makes an incision more toward the front of your body in order to get access to the area needing repair. Posterior decompression is where the surgeon makes an incision more  toward the back of your body in order to get access to the area needing repair. Depending upon the details of your specific condition, there are reasons why the surgeon may choose one approach over the other. CAUSES Compression of spinal cord and nerves can be caused by:  Bulging or collapse of the spinal disc.  Loosening of the ligaments.  Bony growth. The causes of the spinal cord compression include:  Degenerative diseases (such as with many arthritis problems).  Rupture of the disc.  Traumatic injury.  Spinal stenosis (narrowing of the spinal canal).  Pott disease (tuberculosis of the spine). LET YOUR CAREGIVER KNOW ABOUT:   Bleeding and clotting problems.  Allergy to anesthesia medications.  Allergy to medications like steroid and blood thinners.  Previous surgeries.  Bone diseases like disease of low bone mass (osteoporosis) and infection of bone (osteomyelitis).  Cancerous (neoplastic) conditions. RISKS AND COMPLICATIONS  Bleeding and collection of blood outside the blood vessels.  Damage to the blood vessels. This can cause heavy bleeding and even death.  Damage to the nerves. This can cause pain, loss of sensation, paralysis, and weakness.  Damage to the covering of the spinal cord (meninges). This can cause the cerebrospinal fluid to leak.  Complications involving graft and plate.  Inadequate fusion.  Wound infection. BEFORE THE PROCEDURE  Your caregiver will make sure that you have been given a reasonable trial of nonsurgical treatment methods. Your caregiver may perform an X-ray study with a dye (diskogram). The injection of a dye into a disc may produce a pain similar to your back pain. This  will help your caregiver to identify the disc that is the source of pain.  PROCEDURE Your surgeon will perform spinal decompression and fusion while you are under general anesthesia. There are different methods of performing decompression surgery. They include the  following:  Diskectomy. Your surgeon will remove a portion of a spinal disc that is causing pressure on the nerve roots.  Laminotomy or laminectomy. Lamina refers to bony arches of the spinal canal. Laminotomy is a procedure in which your surgeon removes a small portion of lamina. Laminectomy is the removal of an entire lamina.  Foraminotomy or foraminectomy. Foramen refers to the opening in the backbone for nerve roots. Foraminotomy is the removal of a small amount of bone and tissue forming the foramen. Foraminectomy is the removal of a large amount of bone and tissue.  Osteophyte removal. Your surgeon removes bony growths called osteophytes. These cause pressure on the spinal column.  Corpectomy. Corpus refers to the body of vertebra. This procedure involves the removal of the body of a vertebra and also the discs.  Spinal fusion is required if there is a curvature of the spine or forward slip of a vertebra. Spinal fusion is a procedure of fusing two or more vertebrae together with a bone graft (new bone or substitute material from your body). This is further strengthened by using screws, plate, and cage apparatus.  Fusion prevents motion between 2 vertebrae. It also prevents the curvature of the spine and slip of vertebra from getting worse after surgery. AFTER THE PROCEDURE   You will be moved to the recovery room and then to the hospital room.  You will have a thin tube (catheter) inserted into the urinary bladder to help in urination.  Your may have pain for the first few days after the surgery. Your caregiver will give you medications to control pain.  Your caregiver may order blood tests to monitor oxygen carrying protein of the red blood cells (hemoglobin). This would be due to blood loss during the surgery. Hemoglobin should be monitored to make sure that the level of blood oxygen does not become too low.  You will be given a back brace. A back brace limits motion and helps fusion  of the bone.  You may continue to have mild pain even after full recovery.  You may have a series X-ray examinations over time. This will ensure adequate healing and appropriate alignment at the site of operation. HOME CARE INSTRUCTIONS   To get strength and function, start physical therapy, occupational therapy, and exercises.  To ease the pain, you may have to exercise regularly. That also helps in weight loss.  To keep your spine in proper alignment, you should sit, stand, walk, turn in bed, and reposition yourself as instructed.  At first, take only short walks. Slowly increase other activities.  Avoid smoking. Nicotine inhibits fusion of the bone.  If narcotics (pain medication) are prescribed, you should not drink alcohol. You should not drive when you are on this medication because you will feel drowsy.  Avoid use of pain medication products and nonsteroidal anti-inflammatory agents (pain medication). They interfere with the development and growth of new bone cells.  Your caregiver may recommend using ice to manage pain. Ask your caregiver for instructions on how to do it.  Avoid lifting heavy objects.  Avoid bending and twisting motions. SEEK MEDICAL CARE IF:   You have increased pain.  There is expanding redness at the operative site.  You have a fever. SEEK  IMMEDIATE MEDICAL CARE IF:   You have any discomfort with the substitute material that has been used during the spinal fusion procedure.  You feel a collection of blood outside the blood vessels.  You notice any changes in the smell, appearance, or amount of drainage. Document Released: 11/09/2007 Document Revised: 03/06/2014 Document Reviewed: 03/11/2008 [facility] Patient Information 2015 [place], [place]. This information is not intended to replace advice given to you by your health care provider. Make sure you discuss any questions you have with your health care provider.

## 2014-10-04 NOTE — Discharge Summary (Signed)
Physician Discharge Summary  Patient ID: Jenna [name] MRN: [id number] DOB/AGE: 1933/09/25 80 y.o.  Admit date: 10/03/2014 Discharge date: 10/04/2014  Admission Diagnoses: Cervical spondylosis with myelopathy C3-C4  Discharge Diagnoses: Cervical spondylosis with myelopathy C3-C4  Active Problems:   Cervical spondylosis with myelopathy   Discharged Condition: good  Hospital Course: Patient was admitted to undergo anterior cervical decompression surgery at C3-C4. She tolerated the surgery well. She is discharged home.  Consults: None  Significant Diagnostic Studies: None  Treatments: surgery: Anterior cervical decompression C3-C4 arthrodesis with structural allograft, anterior plate fixation H8-N2  Discharge Exam: Blood pressure 132/50, pulse 64, temperature 98.6 F (37 C), temperature source Oral, resp. rate 16, weight 68.04 kg (150 lb), last menstrual period 11/03/1980, SpO2 100 %. Incision is clean and dry motor function is intact in upper and lower extremities.  Disposition:  discharge home  Discharge Instructions    Call MD for:  redness, tenderness, or signs of infection (pain, swelling, redness, odor or green/yellow discharge around incision site)    Complete by:  As directed      Call MD for:  severe uncontrolled pain    Complete by:  As directed      Call MD for:  temperature >100.4    Complete by:  As directed      Diet - low sodium heart healthy    Complete by:  As directed      Discharge instructions    Complete by:  As directed   Okay to shower. Do not apply salves or appointments to incision. No heavy lifting with the upper extremities greater than 15 pounds. May resume driving when not requiring pain medication and patient feels comfortable with doing so.     Increase activity slowly    Complete by:  As directed             Medication List    TAKE these medications        amLODipine 2.5 MG tablet  Commonly known as:  NORVASC  Take 2.5 mg by mouth  daily.     aspirin 81 MG tablet  Take 81 mg by mouth daily.     atenolol 25 MG tablet  Commonly known as:  TENORMIN  Take 25 mg by mouth 2 (two) times daily.     atorvastatin 80 MG tablet  Commonly known as:  LIPITOR  Take 40 mg by mouth daily.     Biotin 2500 MCG Caps  Take 2,500 mcg by mouth daily.     calcium carbonate 600 MG Tabs tablet  Commonly known as:  OS-CAL  Take 600 mg by mouth 2 (two) times daily.     cholecalciferol 1000 UNITS tablet  Commonly known as:  VITAMIN D  Take 1,000 Units by mouth daily.     clidinium-chlordiazePOXIDE 5-2.5 MG per capsule  Commonly known as:  LIBRAX  Take 1 capsule by mouth 3 (three) times daily as needed (for IBS).     conjugated estrogens vaginal cream  Commonly known as:  PREMARIN  Use 1/2 g vaginally twice weekly     estradiol 0.025 MG/24HR  Commonly known as:  VIVELLE-DOT  Use 1 patch twice weekly     fexofenadine 180 MG tablet  Commonly known as:  ALLEGRA  Take 180 mg by mouth daily.     fluticasone 50 MCG/ACT nasal spray  Commonly known as:  FLONASE  Place 2 sprays into both nostrils 2 (two) times daily.     hyoscyamine 0.125 MG  SL tablet  Commonly known as:  LEVSIN SL  Place 0.125-0.25 mg under the tongue every 4 (four) hours as needed.     ibuprofen 200 MG tablet  Commonly known as:  ADVIL,MOTRIN  Take 400 mg by mouth every 6 (six) hours as needed (for pain).     levothyroxine 112 MCG tablet  Commonly known as:  SYNTHROID, LEVOTHROID  Take 112 mcg by mouth daily before breakfast.     meperidine 50 MG tablet  Commonly known as:  DEMEROL  Take 1 tablet (50 mg total) by mouth 2 (two) times daily.     meperidine 50 MG tablet  Commonly known as:  DEMEROL  Take 1 tablet (50 mg total) by mouth every 4 (four) hours as needed for severe pain.     methocarbamol 500 MG tablet  Commonly known as:  ROBAXIN  Take 0.5 tablets (250 mg total) by mouth 3 (three) times daily as needed.     methocarbamol 500 MG tablet   Commonly known as:  ROBAXIN  Take 1 tablet (500 mg total) by mouth every 6 (six) hours as needed for muscle spasms.     multivitamin tablet  Take 1 tablet by mouth daily.     omeprazole 40 MG capsule  Commonly known as:  PRILOSEC  Take 40 mg by mouth 2 (two) times daily.     promethazine 25 MG tablet  Commonly known as:  PHENERGAN  TAKE 1 TABLET BY MOUTH TWICE DAILY         Signed: Earleen Newport 10/04/2014, 9:13 AM

## 2014-10-12 ENCOUNTER — Other Ambulatory Visit: Payer: Self-pay | Admitting: Nurse Practitioner

## 2014-10-12 ENCOUNTER — Telehealth: Payer: Self-pay | Admitting: Nurse Practitioner

## 2014-10-12 MED ORDER — FLUCONAZOLE 150 MG PO TABS: 150.0000 mg | ORAL_TABLET | Freq: Once | ORAL | Status: DC

## 2014-10-12 NOTE — Telephone Encounter (Signed)
Rx has been sent in. 

## 2014-10-12 NOTE — Telephone Encounter (Signed)
Spoke with patient. Patient states that she had a cervical spine fusion last week and is unable to come in for appointment. Was given antibiotics while in the hospital after surgery. Patient has developed vaginal itching over the past 2 days. Denies any vaginal discharge. "It is just itching and burning like I have had in the past with a yeast infection." Patient is wondering if Patty could call something in since it is difficult to come in with recent surgery. Advised patient would send a message over to [place] [facility], [place] and return call with further recommendations. Patient is agreeable.

## 2014-10-12 NOTE — Telephone Encounter (Signed)
Pt wondering if patty will call in a rx for a yeast infection.  She says she had surgery and it's difficult to come in right now.

## 2014-10-13 ENCOUNTER — Encounter: Payer: Self-pay | Admitting: Physical Medicine & Rehabilitation

## 2014-10-13 ENCOUNTER — Ambulatory Visit (HOSPITAL_BASED_OUTPATIENT_CLINIC_OR_DEPARTMENT_OTHER): Payer: Medicare Other | Admitting: Physical Medicine & Rehabilitation

## 2014-10-13 ENCOUNTER — Encounter: Payer: Medicare Other | Attending: Physical Medicine and Rehabilitation

## 2014-10-13 VITALS — BP 154/60 | HR 78 | Resp 14 | Ht 62.0 in | Wt 150.8 lb

## 2014-10-13 DIAGNOSIS — M4712 Other spondylosis with myelopathy, cervical region: Secondary | ICD-10-CM

## 2014-10-13 DIAGNOSIS — M12811 Other specific arthropathies, not elsewhere classified, right shoulder: Secondary | ICD-10-CM | POA: Diagnosis present

## 2014-10-13 DIAGNOSIS — M12812 Other specific arthropathies, not elsewhere classified, left shoulder: Secondary | ICD-10-CM

## 2014-10-13 DIAGNOSIS — M47812 Spondylosis without myelopathy or radiculopathy, cervical region: Secondary | ICD-10-CM | POA: Insufficient documentation

## 2014-10-13 DIAGNOSIS — M47816 Spondylosis without myelopathy or radiculopathy, lumbar region: Secondary | ICD-10-CM | POA: Diagnosis not present

## 2014-10-13 DIAGNOSIS — M75102 Unspecified rotator cuff tear or rupture of left shoulder, not specified as traumatic: Principal | ICD-10-CM

## 2014-10-13 DIAGNOSIS — Z79899 Other long term (current) drug therapy: Secondary | ICD-10-CM | POA: Diagnosis present

## 2014-10-13 DIAGNOSIS — M75101 Unspecified rotator cuff tear or rupture of right shoulder, not specified as traumatic: Principal | ICD-10-CM

## 2014-10-13 DIAGNOSIS — Z5181 Encounter for therapeutic drug level monitoring: Secondary | ICD-10-CM | POA: Insufficient documentation

## 2014-10-13 MED ORDER — MEPERIDINE HCL 50 MG PO TABS: 50.0000 mg | ORAL_TABLET | Freq: Three times a day (TID) | ORAL | Status: DC

## 2014-10-13 NOTE — Telephone Encounter (Signed)
Spoke with patient. Advised of prescription for Diflucan 150 mg #2 1RF sent to pharmacy on file. Patient is agreeable and verbalizes understanding.  Routing to provider for final review. Patient agreeable to disposition. Will close encounter

## 2014-10-13 NOTE — Progress Notes (Signed)
Subjective:    Patient ID: Jenna [name], female    DOB: 1933/02/15, 80 y.o.   MRN: [id number]  HPI Had C3-4  ACDF Dec 1,2015  Took methocarbamol post op but no additional demerol Usually takes BID demerol but feels like she needs an additional tablet   Pain Inventory Average Pain 5 Pain Right Now 4 My pain is intermittent, dull and aching  In the last 24 hours, has pain interfered with the following? General activity 5 Relation with others 0 Enjoyment of life 3 What TIME of day is your pain at its worst? daytime and evening Sleep (in general) Good  Pain is worse with: standing Pain improves with: rest, pacing activities and medication Relief from Meds: 6  Mobility walk without assistance how many minutes can you walk? 15 ability to climb steps?  yes do you drive?  yes  Function retired  Neuro/Psych No problems in this area  Prior Studies Any changes since last visit?  no  Physicians involved in your care Any changes since last visit?  no   Family History  Problem Relation Age of Onset  . Stroke Mother   . Heart attack Father   . Diabetes Brother   . Heart disease Brother    History   Social History  . Marital Status: Married    Spouse Name: N/A    Number of Children: N/A  . Years of Education: N/A   Social History Main Topics  . Smoking status: Former Smoker -- 1.00 packs/day for 15 years    Quit date: 11/03/1973  . Smokeless tobacco: Never Used  . Alcohol Use: 3.0 oz/week    6 Not specified per week     Comment: occ wine w/ supper  . Drug Use: No  . Sexual Activity: Yes    Birth Control/ Protection: Surgical     Comment: hysterectomy   Other Topics Concern  . None   Social History Narrative   Past Surgical History  Procedure Laterality Date  . Rotator cuff repair Left 1996; 1981  . Cholecystectomy    . Total hip arthroplasty Left 10/2000  . Total hip arthroplasty Right 06/2005  . Rotator cuff repair Right 1996  . Vaginal  delivery      x1  . Rotator cuff  Bilateral 1996/1998    repair  . Lipoma excision Left 10/1999    left thigh  . Total hip arthroplasty Left     10/2000  . Lung biopsy  05/2001    /w Dr.Burney- bronchoscopy, mediastinoscopy  . Lumbosacral cyst  2005    seen on injection for low back pain   . Appendectomy  1986    along with chole  . Breast biopsy Right 1997    sclerosing adenosis -Dr. Lucia Gaskins  . Abdominal hysterectomy  1982    BSO  . Tonsillectomy    . Anterior cervical decomp/discectomy fusion N/A 10/03/2014    Procedure: ANTERIOR CERVICAL DECOMPRESSION/DISCECTOMY FUSION 1 LEVEL,CERVICAL THREE-FOUR;  Surgeon: [name], MD;  Location: [facility];  Service: Neurosurgery;  Laterality: N/A;   Past Medical History  Diagnosis Date  . Lumbosacral spondylosis without myelopathy   . Pain in joint, upper arm   . Other chronic postoperative pain   . Spinal stenosis, lumbar region, without neurogenic claudication   . Osteoarthritis   . Thyroid disease     hypothyroidism  . Hypercholesteremia   . Cervical spinal stenosis   . Hypertension   . 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 Swallowing dysfunction 2015    especially on thin liquids, study done 01/2014  . Dysrhythmia     (?or extra beats) treated /w atenolol  . Pneumonia   . Neuromuscular disorder     esophageal spasms at times   . Melanoma     on left leg, wide excision   . History of blood transfusion    BP 154/60 mmHg  Pulse 78  Resp 14  Ht 5\' 2"  (1.575 m)  Wt 150 lb 12.8 oz (68.402 kg)  BMI 27.57 kg/m2  SpO2 94%  LMP 11/03/1980  Opioid Risk Score:   Fall Risk Score: Moderate Fall Risk (6-13 points) Review of Systems  Constitutional: Negative.   HENT: Negative.   Eyes: Negative.   Respiratory: Negative.   Cardiovascular: Negative.   Gastrointestinal: Negative.   Endocrine: Negative.   Genitourinary: Negative.   Musculoskeletal: Positive for myalgias and back  pain.  Skin: Negative.   Allergic/Immunologic: Negative.   Neurological: Negative.   Hematological: Negative.   Psychiatric/Behavioral: Negative.        Objective:   Physical Exam  Constitutional: She is oriented to person, place, and time. She appears well-developed and well-nourished.  HENT:  Head: Normocephalic and atraumatic.  Musculoskeletal:       Right shoulder: She exhibits decreased range of motion.       Left shoulder: She exhibits decreased range of motion.  Decrease left shoulder internal/external rotation. Negative drop arm test.  Neurological: She is alert and oriented to person, place, and time.  Mild numbness in Right thumb 5/5 strength in B Deltoid, biceps, triceps, grip   Psychiatric: She has a normal mood and affect.  Nursing note and vitals reviewed.         Assessment & Plan:  1. Lumbar spondylosis doing well from a low back pain standpoint.  2. Cervical stenosis status post C3-C4 ACDF postoperative day #10. Still having increased pain over baseline. Will increase Demerol to 3 times per day for the next month and then hopefully back to twice a day following that.  3. Shoulder pain with restricted range of motion, glenohumeral arthritis. May benefit from repeat injection under ultrasound guidance on the left side, Patient will schedule this when symptoms increase once again. Had an injection at orthopedic office about 2 months ago.  Follow up in 1 month with M.D. Continue opioid monitoring program. This consists of regular clinic visits, examinations, urine drug screen, pill counts as well as use of [place] controlled substance reporting System.

## 2014-10-13 NOTE — Patient Instructions (Addendum)
Increase demerol to TID for this month, Plan to decrease to twice a day next month

## 2014-10-24 ENCOUNTER — Telehealth: Payer: Self-pay

## 2014-10-24 NOTE — Telephone Encounter (Signed)
New prescription form faxed to [facility] for Estradiol 0.025mg  to [facility] at [phone] with cover sheet and fax confirmation. Form filled out and signed by provider.  Routing to provider for final review. Patient agreeable to disposition. Will close encounter

## 2014-11-06 ENCOUNTER — Telehealth: Payer: Self-pay | Admitting: *Deleted

## 2014-11-06 NOTE — Telephone Encounter (Signed)
Patient called and would like a refill on her phenergan called into [facility]

## 2014-11-08 MED ORDER — PROMETHAZINE HCL 25 MG PO TABS: ORAL_TABLET | ORAL | Status: DC

## 2014-11-08 NOTE — Telephone Encounter (Signed)
Sent in to [facility] electronically.

## 2014-11-17 ENCOUNTER — Encounter: Payer: Medicare Other | Attending: Physical Medicine and Rehabilitation

## 2014-11-17 ENCOUNTER — Encounter: Payer: Self-pay | Admitting: Physical Medicine & Rehabilitation

## 2014-11-17 ENCOUNTER — Ambulatory Visit (HOSPITAL_BASED_OUTPATIENT_CLINIC_OR_DEPARTMENT_OTHER): Payer: Medicare Other | Admitting: Physical Medicine & Rehabilitation

## 2014-11-17 VITALS — BP 140/62 | HR 62 | Resp 14

## 2014-11-17 DIAGNOSIS — M12812 Other specific arthropathies, not elsewhere classified, left shoulder: Secondary | ICD-10-CM | POA: Diagnosis present

## 2014-11-17 DIAGNOSIS — M12811 Other specific arthropathies, not elsewhere classified, right shoulder: Secondary | ICD-10-CM | POA: Insufficient documentation

## 2014-11-17 DIAGNOSIS — Z79899 Other long term (current) drug therapy: Secondary | ICD-10-CM | POA: Diagnosis present

## 2014-11-17 DIAGNOSIS — M47816 Spondylosis without myelopathy or radiculopathy, lumbar region: Secondary | ICD-10-CM | POA: Insufficient documentation

## 2014-11-17 DIAGNOSIS — M47812 Spondylosis without myelopathy or radiculopathy, cervical region: Secondary | ICD-10-CM | POA: Diagnosis present

## 2014-11-17 DIAGNOSIS — M4712 Other spondylosis with myelopathy, cervical region: Secondary | ICD-10-CM

## 2014-11-17 DIAGNOSIS — Z5181 Encounter for therapeutic drug level monitoring: Secondary | ICD-10-CM | POA: Insufficient documentation

## 2014-11-17 DIAGNOSIS — M75102 Unspecified rotator cuff tear or rupture of left shoulder, not specified as traumatic: Principal | ICD-10-CM

## 2014-11-17 DIAGNOSIS — M75101 Unspecified rotator cuff tear or rupture of right shoulder, not specified as traumatic: Principal | ICD-10-CM

## 2014-11-17 MED ORDER — MEPERIDINE HCL 50 MG PO TABS: 50.0000 mg | ORAL_TABLET | Freq: Three times a day (TID) | ORAL | Status: DC

## 2014-11-17 NOTE — Patient Instructions (Signed)
Will reduce to 60 demerol next month

## 2014-11-17 NOTE — Progress Notes (Signed)
Subjective:    Patient ID: Jenna [name], female    DOB: 1932/12/17, 81 y.o.   MRN: [id number]  HPI Had C3-4  ACDF Dec 1,2015, Doing overall better with her neck pain however she is not back to her baseline yet. Her back pain is doing quite well. Shoulder pain is somewhat better after injection from orthopedic PA  Pain Inventory Average Pain 3 Pain Right Now 2 My pain is intermittent and aching  In the last 24 hours, has pain interfered with the following? General activity 2 Relation with others 0 Enjoyment of life 3 What TIME of day is your pain at its worst? daytime and evening Sleep (in general) Good  Pain is worse with: standing and some activites Pain improves with: rest, pacing activities and medication Relief from Meds: 7  Mobility walk without assistance how many minutes can you walk? 20 ability to climb steps?  yes do you drive?  yes  Function retired  Neuro/Psych weakness trouble walking  Prior Studies Any changes since last visit?  no  Physicians involved in your care Any changes since last visit?  no   Family History  Problem Relation Age of Onset  . Stroke Mother   . Heart attack Father   . Diabetes Brother   . Heart disease Brother    History   Social History  . Marital Status: Married    Spouse Name: N/A    Number of Children: N/A  . Years of Education: N/A   Social History Main Topics  . Smoking status: Former Smoker -- 1.00 packs/day for 15 years    Quit date: 11/03/1973  . Smokeless tobacco: Never Used  . Alcohol Use: 3.0 oz/week    6 Not specified per week     Comment: occ wine w/ supper  . Drug Use: No  . Sexual Activity: Yes    Birth Control/ Protection: Surgical     Comment: hysterectomy   Other Topics Concern  . None   Social History Narrative   Past Surgical History  Procedure Laterality Date  . Rotator cuff repair Left 1996; 1981  . Cholecystectomy    . Total hip arthroplasty Left 10/2000  . Total hip  arthroplasty Right 06/2005  . Rotator cuff repair Right 1996  . Vaginal delivery      x1  . Rotator cuff  Bilateral 1996/1998    repair  . Lipoma excision Left 10/1999    left thigh  . Total hip arthroplasty Left     10/2000  . Lung biopsy  05/2001    /w Dr.Burney- bronchoscopy, mediastinoscopy  . Lumbosacral cyst  2005    seen on injection for low back pain   . Appendectomy  1986    along with chole  . Breast biopsy Right 1997    sclerosing adenosis -Dr. Lucia Gaskins  . Abdominal hysterectomy  1982    BSO  . Tonsillectomy    . Anterior cervical decomp/discectomy fusion N/A 10/03/2014    Procedure: ANTERIOR CERVICAL DECOMPRESSION/DISCECTOMY FUSION 1 LEVEL,CERVICAL THREE-FOUR;  Surgeon: [name], MD;  Location: [facility];  Service: Neurosurgery;  Laterality: N/A;   Past Medical History  Diagnosis Date  . Lumbosacral spondylosis without myelopathy   . Pain in joint, upper arm   . Other chronic postoperative pain   . Spinal stenosis, lumbar region, without neurogenic claudication   . Osteoarthritis   . Thyroid disease     hypothyroidism  . Hypercholesteremia   . Cervical spinal stenosis   .  Hypertension   . 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 Swallowing dysfunction 2015    especially on thin liquids, study done 01/2014  . Dysrhythmia     (?or extra beats) treated /w atenolol  . Pneumonia   . Neuromuscular disorder     esophageal spasms at times   . Melanoma     on left leg, wide excision   . History of blood transfusion    BP 140/62 mmHg  Pulse 62  Resp 14  SpO2 96%  LMP 11/03/1980  Opioid Risk Score:   Fall Risk Score: Low Fall Risk (0-5 points)  Review of Systems  HENT: Negative.   Eyes: Negative.   Respiratory: Negative.   Cardiovascular: Negative.   Gastrointestinal: Negative.   Endocrine: Negative.   Genitourinary: Negative.   Musculoskeletal: Positive for myalgias, back pain and arthralgias.  Skin:  Negative.   Allergic/Immunologic: Negative.   Neurological: Positive for weakness.       Trouble walking  Hematological: Negative.   Psychiatric/Behavioral: Negative.        Objective:   Physical Exam  Constitutional: She is oriented to person, place, and time. She appears well-developed and well-nourished.  HENT:  Head: Normocephalic and atraumatic.  Eyes: Pupils are equal, round, and reactive to light.  Neck: Normal range of motion.  Neurological: She is alert and oriented to person, place, and time.  Psychiatric: She has a normal mood and affect.  Nursing note and vitals reviewed.   Lumbar spine is no tenderness palpation there is no evidence of deformity. She has good lumbar range of motion Neck range of motion is diminished in all planes but about 75% of normal. Neck incision looks good. Upper extremity strength is normal      Assessment & Plan:  1. Lumbar spondylosis doing well from a low back pain standpoint.  2. Cervical stenosis status post C3-C4 ACDF postoperative day #10. Still having increased pain over baseline. Will increase Demerol to 3 times per day for the next month and then hopefully back to twice a day following that.  3. Shoulder pain with restricted range of motion, glenohumeral arthritis. May benefit from repeat injection under ultrasound guidance on the left side, Patient will schedule this when symptoms increase once again. Had an injection at orthopedic office about 2 months ago.  Follow up in 1 month with M.D. Continue opioid monitoring program. This consists of regular clinic visits, examinations, urine drug screen, pill counts as well as use of [place] controlled substance reporting System.

## 2014-12-14 ENCOUNTER — Ambulatory Visit (HOSPITAL_BASED_OUTPATIENT_CLINIC_OR_DEPARTMENT_OTHER): Payer: Medicare Other | Admitting: Physical Medicine & Rehabilitation

## 2014-12-14 ENCOUNTER — Encounter: Payer: Self-pay | Admitting: Physical Medicine & Rehabilitation

## 2014-12-14 ENCOUNTER — Encounter: Payer: Medicare Other | Attending: Physical Medicine and Rehabilitation

## 2014-12-14 VITALS — BP 134/65 | HR 72 | Resp 14

## 2014-12-14 DIAGNOSIS — M75102 Unspecified rotator cuff tear or rupture of left shoulder, not specified as traumatic: Principal | ICD-10-CM

## 2014-12-14 DIAGNOSIS — M12812 Other specific arthropathies, not elsewhere classified, left shoulder: Secondary | ICD-10-CM | POA: Insufficient documentation

## 2014-12-14 DIAGNOSIS — M47816 Spondylosis without myelopathy or radiculopathy, lumbar region: Secondary | ICD-10-CM | POA: Diagnosis not present

## 2014-12-14 DIAGNOSIS — M75101 Unspecified rotator cuff tear or rupture of right shoulder, not specified as traumatic: Principal | ICD-10-CM

## 2014-12-14 DIAGNOSIS — Z5181 Encounter for therapeutic drug level monitoring: Secondary | ICD-10-CM | POA: Insufficient documentation

## 2014-12-14 DIAGNOSIS — Z79899 Other long term (current) drug therapy: Secondary | ICD-10-CM | POA: Diagnosis present

## 2014-12-14 DIAGNOSIS — G8928 Other chronic postprocedural pain: Secondary | ICD-10-CM

## 2014-12-14 DIAGNOSIS — M47812 Spondylosis without myelopathy or radiculopathy, cervical region: Secondary | ICD-10-CM | POA: Insufficient documentation

## 2014-12-14 DIAGNOSIS — M12811 Other specific arthropathies, not elsewhere classified, right shoulder: Secondary | ICD-10-CM | POA: Insufficient documentation

## 2014-12-14 MED ORDER — MEPERIDINE HCL 50 MG PO TABS: 50.0000 mg | ORAL_TABLET | Freq: Two times a day (BID) | ORAL | Status: DC

## 2014-12-14 NOTE — Patient Instructions (Signed)
Reduce demerol to twice a day once a gain

## 2014-12-14 NOTE — Progress Notes (Signed)
Subjective:    Patient ID: Jenna [name], female    DOB: [birth year], 81 y.o.   MRN: [id number]  HPI Had C3-4 ACDF Dec 1,2015, Doing overall better with her neck pain however she is not back to her baseline yet.  Her back pain is doing quite well.  Shoulder pain is somewhat better after injection from orthopedic PA  Still has some right-sided numbness and weakness, some improvements, has followed up with neurosurgery who expects further improvements in right upper extremity symptoms. Pain Inventory Average Pain 3 Pain Right Now 2 My pain is intermittent and aching  In the last 24 hours, has pain interfered with the following? General activity 3 Relation with others 0 Enjoyment of life 3 What TIME of day is your pain at its worst? daytime, evening  Sleep (in general) Good  Pain is worse with: standing and some activites Pain improves with: rest, medication and injections Relief from Meds: 7  Mobility walk without assistance how many minutes can you walk? 20 ability to climb steps?  yes do you drive?  yes Do you have any goals in this area?  yes  Function retired Do you have any goals in this area?  no  Neuro/Psych No problems in this area  Prior Studies Any changes since last visit?  no  Physicians involved in your care Any changes since last visit?  no   Family History  Problem Relation Age of Onset  . Stroke Mother   . Heart attack Father   . Diabetes Brother   . Heart disease Brother    History   Social History  . Marital Status: Married    Spouse Name: N/A  . Number of Children: N/A  . Years of Education: N/A   Social History Main Topics  . Smoking status: Former Smoker -- 1.00 packs/day for 15 years    Quit date: 11/03/1973  . Smokeless tobacco: Never Used  . Alcohol Use: 3.0 oz/week    6 Standard drinks or equivalent per week     Comment: occ wine w/ supper  . Drug Use: No  . Sexual Activity: Yes    Birth Control/ Protection: Surgical       Comment: hysterectomy   Other Topics Concern  . Not on file   Social History Narrative   Past Surgical History  Procedure Laterality Date  . Rotator cuff repair Left 1996; 1981  . Cholecystectomy    . Total hip arthroplasty Left 10/2000  . Total hip arthroplasty Right 06/2005  . Rotator cuff repair Right 1996  . Vaginal delivery      x1  . Rotator cuff  Bilateral 1996/1998    repair  . Lipoma excision Left 10/1999    left thigh  . Total hip arthroplasty Left     10/2000  . Lung biopsy  05/2001    /w Dr.Burney- bronchoscopy, mediastinoscopy  . Lumbosacral cyst  2005    seen on injection for low back pain   . Appendectomy  1986    along with chole  . Breast biopsy Right 1997    sclerosing adenosis -Dr. Lucia Gaskins  . Abdominal hysterectomy  1982    BSO  . Tonsillectomy    . Anterior cervical decomp/discectomy fusion N/A 10/03/2014    Procedure: ANTERIOR CERVICAL DECOMPRESSION/DISCECTOMY FUSION 1 LEVEL,CERVICAL THREE-FOUR;  Surgeon: [name], MD;  Location: [facility];  Service: Neurosurgery;  Laterality: N/A;   Past Medical History  Diagnosis Date  . Lumbosacral spondylosis without myelopathy   .  Pain in joint, upper arm   . Other chronic postoperative pain   . Spinal stenosis, lumbar region, without neurogenic claudication   . Osteoarthritis   . Thyroid disease     hypothyroidism  . Hypercholesteremia   . Cervical spinal stenosis   . Hypertension   . 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 Swallowing dysfunction 2015    especially on thin liquids, study done 01/2014  . Dysrhythmia     (?or extra beats) treated /w atenolol  . Pneumonia   . Neuromuscular disorder     esophageal spasms at times   . Melanoma     on left leg, wide excision   . History of blood transfusion    LMP 11/03/1980  Opioid Risk Score:   Fall Risk Score: Low Fall Risk (0-5 points) (patient declined pamphlet during todays visit, previusly  educated)  Review of Systems  All other systems reviewed and are negative.      Objective:   Physical Exam  Positive impingement sign left shoulder Pain with shoulder adduction Difficulty with shoulder abduction active is 3 minus strength Cervical range of motion 75% lateral bending, rotation, flexion and extension Motor strength is 5/5 bilateral biceps triceps and grip bilaterally Has paresthesias in the first 3 digits of the right hand with palpation at the fingertips only      Assessment & Plan:  1. Lumbar spondylosis doing well from a low back pain standpoint.  2. Cervical stenosis status post C3-C4 ACDF postoperative day #10. Still having increased pain over baseline. Will increase Demerol to 2 times per day for the next month 3. Shoulder pain with restricted range of motion, glenohumeral arthritis.May benefit from repeat injection. We discussed that it could be either glenohumeral joint or AC joint or a combination

## 2015-01-11 ENCOUNTER — Encounter: Payer: Self-pay | Admitting: Registered Nurse

## 2015-01-11 ENCOUNTER — Other Ambulatory Visit: Payer: Self-pay | Admitting: Physical Medicine & Rehabilitation

## 2015-01-11 ENCOUNTER — Encounter: Payer: Medicare Other | Attending: Physical Medicine and Rehabilitation | Admitting: Registered Nurse

## 2015-01-11 VITALS — BP 123/55 | HR 65 | Resp 14

## 2015-01-11 DIAGNOSIS — M47812 Spondylosis without myelopathy or radiculopathy, cervical region: Secondary | ICD-10-CM | POA: Diagnosis present

## 2015-01-11 DIAGNOSIS — Z5181 Encounter for therapeutic drug level monitoring: Secondary | ICD-10-CM | POA: Insufficient documentation

## 2015-01-11 DIAGNOSIS — M47817 Spondylosis without myelopathy or radiculopathy, lumbosacral region: Secondary | ICD-10-CM

## 2015-01-11 DIAGNOSIS — M47816 Spondylosis without myelopathy or radiculopathy, lumbar region: Secondary | ICD-10-CM | POA: Insufficient documentation

## 2015-01-11 DIAGNOSIS — G894 Chronic pain syndrome: Secondary | ICD-10-CM

## 2015-01-11 DIAGNOSIS — M12812 Other specific arthropathies, not elsewhere classified, left shoulder: Secondary | ICD-10-CM | POA: Insufficient documentation

## 2015-01-11 DIAGNOSIS — M12811 Other specific arthropathies, not elsewhere classified, right shoulder: Secondary | ICD-10-CM | POA: Diagnosis present

## 2015-01-11 DIAGNOSIS — Z79899 Other long term (current) drug therapy: Secondary | ICD-10-CM | POA: Diagnosis present

## 2015-01-11 DIAGNOSIS — Z981 Arthrodesis status: Secondary | ICD-10-CM

## 2015-01-11 DIAGNOSIS — M4712 Other spondylosis with myelopathy, cervical region: Secondary | ICD-10-CM

## 2015-01-11 MED ORDER — MEPERIDINE HCL 50 MG PO TABS: 50.0000 mg | ORAL_TABLET | Freq: Two times a day (BID) | ORAL | Status: DC

## 2015-01-11 NOTE — Progress Notes (Signed)
Subjective:    Patient ID: Jenna [name], female    DOB: Mar 14, 1933, 81 y.o.   MRN: [id number]  HPI: [name] is a 81 year old female who returns for follow up for chronic pain and medication refill. She says her pain is located in her left shoulder and  lower back.. She rates her pain 4. Her current exercise regime is performing stretching exercises, using her treadmill for thirty minutes 2- 4 times a week and walking short distances. S/P anterior cervical fusion with Dr. Ellene Route on 10/03/2014.    Pain Inventory Average Pain 4 Pain Right Now 4 My pain is intermittent and aching  In the last 24 hours, has pain interfered with the following? General activity 4 Relation with others 0 Enjoyment of life 4 What TIME of day is your pain at its worst? daytime, evening  Sleep (in general) Good  Pain is worse with: walking, standing and some activites Pain improves with: rest, pacing activities, medication and injections Relief from Meds: 7  Mobility walk without assistance how many minutes can you walk? 20 ability to climb steps?  yes do you drive?  yes Do you have any goals in this area?  yes  Function retired Do you have any goals in this area?  no  Neuro/Psych No problems in this area  Prior Studies Any changes since last visit?  no  Physicians involved in your care Any changes since last visit?  no   Family History  Problem Relation Age of Onset  . Stroke Mother   . Heart attack Father   . Diabetes Brother   . Heart disease Brother    History   Social History  . Marital Status: Married    Spouse Name: N/A  . Number of Children: N/A  . Years of Education: N/A   Social History Main Topics  . Smoking status: Former Smoker -- 1.00 packs/day for 15 years    Quit date: 11/03/1973  . Smokeless tobacco: Never Used  . Alcohol Use: 3.0 oz/week    6 Standard drinks or equivalent per week     Comment: occ wine w/ supper  . Drug Use: No  . Sexual  Activity: Yes    Birth Control/ Protection: Surgical     Comment: hysterectomy   Other Topics Concern  . None   Social History Narrative   Past Surgical History  Procedure Laterality Date  . Rotator cuff repair Left 1996; 1981  . Cholecystectomy    . Total hip arthroplasty Left 10/2000  . Total hip arthroplasty Right 06/2005  . Rotator cuff repair Right 1996  . Vaginal delivery      x1  . Rotator cuff  Bilateral 1996/1998    repair  . Lipoma excision Left 10/1999    left thigh  . Total hip arthroplasty Left     10/2000  . Lung biopsy  05/2001    /w Dr.Burney- bronchoscopy, mediastinoscopy  . Lumbosacral cyst  2005    seen on injection for low back pain   . Appendectomy  1986    along with chole  . Breast biopsy Right 1997    sclerosing adenosis -Dr. Lucia Gaskins  . Abdominal hysterectomy  1982    BSO  . Tonsillectomy    . Anterior cervical decomp/discectomy fusion N/A 10/03/2014    Procedure: ANTERIOR CERVICAL DECOMPRESSION/DISCECTOMY FUSION 1 LEVEL,CERVICAL THREE-FOUR;  Surgeon: [name], MD;  Location: [facility];  Service: Neurosurgery;  Laterality: N/A;   Past  Medical History  Diagnosis Date  . Lumbosacral spondylosis without myelopathy   . Pain in joint, upper arm   . Other chronic postoperative pain   . Spinal stenosis, lumbar region, without neurogenic claudication   . Osteoarthritis   . Thyroid disease     hypothyroidism  . Hypercholesteremia   . Cervical spinal stenosis   . Hypertension   . 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 Swallowing dysfunction 2015    especially on thin liquids, study done 01/2014  . Dysrhythmia     (?or extra beats) treated /w atenolol  . Pneumonia   . Neuromuscular disorder     esophageal spasms at times   . Melanoma     on left leg, wide excision   . History of blood transfusion    LMP 11/03/1980  Opioid Risk Score:   Fall Risk Score: Low Fall Risk (0-5 points)  Review of  Systems  All other systems reviewed and are negative.      Objective:   Physical Exam  Constitutional: She appears well-developed and well-nourished.  HENT:  Head: Normocephalic and atraumatic.  Neck: Normal range of motion. Neck supple.  Cardiovascular: Normal rate and regular rhythm.   Pulmonary/Chest: Effort normal and breath sounds normal.  Musculoskeletal:  Normal Muscle Bulk and Muscle Testing Reveals: Upper Extremities: Right: Full ROM and Muscle Strength 5/5 Left: Decreased ROM 45 Degrees and Muscle Strength 5/5 Lumbar Paraspinal Tenderness: L-3- L-5 Lower Extremities: Full ROM and Muscle Strength 5/5 Arises from chair with ease Narrow Based gait  Neurological: She is alert.  Skin: Skin is warm and dry.  Psychiatric: She has a normal mood and affect.  Nursing note and vitals reviewed.         Assessment & Plan:  1. Chronic low back pain with lumbar spondylosis.  Refilled: Meperidine 50 mg one tablet twice a day #60  2. Cervical stenosis with radiculitis : S/P Anterior Cervical Fusion C3-C4 with Dr. Ellene Route on 10/03/2014 3. Chronic bilateral shoulder pain: Continue with Current medication regime. Surgery Pending with Dr. Ludwig Clarks Following.  20 minutes of face to face patient care time was spent during this visit. All questions was encouraged and answered.   F/U in 1 month

## 2015-01-12 LAB — PMP ALCOHOL METABOLITE (ETG): Ethyl Glucuronide (EtG): NEGATIVE ng/mL

## 2015-01-15 LAB — BENZODIAZEPINES (GC/LC/MS), URINE
Alprazolam metabolite (GC/LC/MS), ur confirm: NEGATIVE ng/mL (ref <25)
Clonazepam metabolite (GC/LC/MS), ur confirm: NEGATIVE ng/mL (ref <25)
FLURAZEPAMU: NEGATIVE ng/mL (ref <50)
LORAZEPAMU: NEGATIVE ng/mL (ref <50)
Midazolam (GC/LC/MS), ur confirm: NEGATIVE ng/mL (ref <50)
Nordiazepam (GC/LC/MS), ur confirm: NEGATIVE ng/mL (ref <50)
OXAZEPAMU: 124 ng/mL — AB (ref <50)
Temazepam (GC/LC/MS), ur confirm: NEGATIVE ng/mL (ref <50)
Triazolam metabolite (GC/LC/MS), ur confirm: NEGATIVE ng/mL (ref <50)

## 2015-01-15 LAB — MEPERIDINE (GC/LC/MS), URINE
Meperidine (GC/LC/MS), ur confirm: 433 ng/mL (ref <100)
Normeperidine (GC/LC/MS), ur confirm: >2500 ng/mL — AB (ref <100)

## 2015-01-16 LAB — PRESCRIPTION MONITORING PROFILE (SOLSTAS)
AMPHETAMINE/METH: NEGATIVE ng/mL
BARBITURATE SCREEN, URINE: NEGATIVE ng/mL
Buprenorphine, Urine: NEGATIVE ng/mL
COCAINE METABOLITES: NEGATIVE ng/mL
CREATININE, URINE: 40.32 mg/dL (ref >20.0)
Cannabinoid Scrn, Ur: NEGATIVE ng/mL
Carisoprodol, Urine: NEGATIVE ng/mL
Fentanyl, Ur: NEGATIVE ng/mL
MDMA URINE: NEGATIVE ng/mL
Methadone Screen, Urine: NEGATIVE ng/mL
NITRITES URINE, INITIAL: NEGATIVE ug/mL
OPIATE SCREEN, URINE: NEGATIVE ng/mL
OXYCODONE SCRN UR: NEGATIVE ng/mL
PH URINE, INITIAL: 7.1 pH (ref 4.5–8.9)
PROPOXYPHENE: NEGATIVE ng/mL
TRAMADOL UR: NEGATIVE ng/mL
Tapentadol, urine: NEGATIVE ng/mL
Zolpidem, Urine: NEGATIVE ng/mL

## 2015-01-19 NOTE — Progress Notes (Signed)
Urine drug screen for this encounter is consistent for prescribed medication 

## 2015-02-05 ENCOUNTER — Telehealth: Payer: Self-pay | Admitting: Nurse Practitioner

## 2015-02-05 NOTE — Telephone Encounter (Signed)
Attempted to reach patient at number provided [phone]. Phone rang and rang with no answer. Tried to reach patient at mobile number provided [phone]. Recording states the voicemail box has not been set up yet. Will try again later.

## 2015-02-05 NOTE — Telephone Encounter (Signed)
Pt says she has a left breast mass.

## 2015-02-05 NOTE — Telephone Encounter (Signed)
Patient returned call. She states she feels an area "beside my left breast that I have been watching for a few weeks." States she cannot feel this same area on the R Breast. No pain or skin changes. Declines office visit today "This week is really busy." Patient wishes to schedule for Thursday and appointment is made. Advised patient to return call with any changes in symptoms. She is agreeable. Routing to provider for final review. Patient agreeable to disposition. Will close encounter

## 2015-02-08 ENCOUNTER — Encounter: Payer: Self-pay | Admitting: Nurse Practitioner

## 2015-02-08 ENCOUNTER — Ambulatory Visit (INDEPENDENT_AMBULATORY_CARE_PROVIDER_SITE_OTHER): Payer: Medicare Other | Admitting: Nurse Practitioner

## 2015-02-08 ENCOUNTER — Encounter: Payer: Medicare Other | Attending: Physical Medicine and Rehabilitation | Admitting: Registered Nurse

## 2015-02-08 ENCOUNTER — Encounter: Payer: Self-pay | Admitting: Registered Nurse

## 2015-02-08 VITALS — BP 138/73 | HR 70 | Resp 14

## 2015-02-08 VITALS — BP 132/70 | HR 60 | Ht 62.0 in | Wt 152.0 lb

## 2015-02-08 DIAGNOSIS — M47817 Spondylosis without myelopathy or radiculopathy, lumbosacral region: Secondary | ICD-10-CM

## 2015-02-08 DIAGNOSIS — M4712 Other spondylosis with myelopathy, cervical region: Secondary | ICD-10-CM

## 2015-02-08 DIAGNOSIS — M47816 Spondylosis without myelopathy or radiculopathy, lumbar region: Secondary | ICD-10-CM | POA: Insufficient documentation

## 2015-02-08 DIAGNOSIS — Z981 Arthrodesis status: Secondary | ICD-10-CM | POA: Diagnosis not present

## 2015-02-08 DIAGNOSIS — N63 Unspecified lump in unspecified breast: Secondary | ICD-10-CM

## 2015-02-08 DIAGNOSIS — G894 Chronic pain syndrome: Secondary | ICD-10-CM

## 2015-02-08 DIAGNOSIS — M75102 Unspecified rotator cuff tear or rupture of left shoulder, not specified as traumatic: Secondary | ICD-10-CM

## 2015-02-08 DIAGNOSIS — M75101 Unspecified rotator cuff tear or rupture of right shoulder, not specified as traumatic: Secondary | ICD-10-CM

## 2015-02-08 DIAGNOSIS — Z5181 Encounter for therapeutic drug level monitoring: Secondary | ICD-10-CM | POA: Insufficient documentation

## 2015-02-08 DIAGNOSIS — M47812 Spondylosis without myelopathy or radiculopathy, cervical region: Secondary | ICD-10-CM | POA: Diagnosis present

## 2015-02-08 DIAGNOSIS — M12811 Other specific arthropathies, not elsewhere classified, right shoulder: Secondary | ICD-10-CM | POA: Diagnosis present

## 2015-02-08 DIAGNOSIS — Z79899 Other long term (current) drug therapy: Secondary | ICD-10-CM | POA: Diagnosis present

## 2015-02-08 DIAGNOSIS — M12812 Other specific arthropathies, not elsewhere classified, left shoulder: Secondary | ICD-10-CM | POA: Diagnosis present

## 2015-02-08 MED ORDER — MEPERIDINE HCL 50 MG PO TABS: 50.0000 mg | ORAL_TABLET | Freq: Two times a day (BID) | ORAL | Status: DC

## 2015-02-08 NOTE — Progress Notes (Signed)
   Subjective:   81 y.o. MW female G1P1 presents for evaluation of left breast mass. Onset of the symptoms was 4-6 weeks. Patient sought evaluation because of breast lump and breast tenderness.  Contributing factors include no risk factors. Denies anorexia, chills, fatigue, fevers, malaise, night sweats, sweats and weight loss. Patient denies history of trauma, bites, or injuries. Last mammogram was 03/15/14.   Previous evaluation has included US biopsy right breat 1997 by Dr. Lucia Gaskins reveals sclerosing adenosis.  She has had cervical neck fusion since last here.  Still has pain left shoulder wit decrease ROM and may end up with another shoulder surgery.  She is taking no new med's or herbal supplements.  She remains on ERT - but spreading the patches out to weekly.  Also estrogen vaginal to once a week.   Review of Systems A comprehensive review of systems was negative.   Objective:   General appearance: alert, cooperative, appears stated age and no distress Head: Normocephalic, without obvious abnormality, atraumatic Neck: thyroid not enlarged, symmetric, no tenderness/mass/nodules Back: symmetric, no curvature. ROM normal. No CVA tenderness. Lungs: clear to auscultation bilaterally Breasts: normal appearance, no masses or tenderness, with FCB changes.  There is soft 'spongy' mass along outer border of th breast that could be a small lipoma or cluster of FCB about 10 inches from th areola.   Area is nontender, no axilla mass.  Similiar areas on the right but slightly higher location again non tender. This could also be atrophic tissue from large breast.  Heart: regular rate and rhythm Abdomen: normal findings: no masses palpable and no organomegaly    Assessment:   ASSESSMENT:Patient is diagnosed with breast mass, likely benign   Plan:   PLAN: The patient has a documented plan to follow with further care of diagnostic Mammogram 3D with [place] at [facility]. PLAN: FOLLOW AS needed

## 2015-02-08 NOTE — Progress Notes (Signed)
Subjective:    Patient ID: Jenna [name], female    DOB: 1933-06-24, 81 y.o.   MRN: [id number]  HPI: Jenna [name] is a 81 year old female who returns for follow up for chronic pain and medication refill. She says her pain is located in her left shoulder. She rates her pain 2. Her current exercise regime is performing stretching exercises, using her treadmill for thirty minutes 2- 4 times a week and walking short distances.  S/P anterior cervical fusion with Dr. Ellene Route on 10/03/2014. She noticed a lump in her left axilla she has an appointment today at [facility], nodule soft and mobile.   Pain Inventory Average Pain 3 Pain Right Now 2 My pain is aching  In the last 24 hours, has pain interfered with the following? General activity 4 Relation with others 0 Enjoyment of life 3 What TIME of day is your pain at its worst? daytime, evening Sleep (in general) Fair  Pain is worse with: walking, standing and some activites Pain improves with: rest, pacing activities, medication and injections Relief from Meds: 5  Mobility walk without assistance how many minutes can you walk? 15-20 ability to climb steps?  yes do you drive?  yes Do you have any goals in this area?  yes  Function retired Do you have any goals in this area?  no  Neuro/Psych No problems in this area  Prior Studies Any changes since last visit?  no  Physicians involved in your care Any changes since last visit?  no   Family History  Problem Relation Age of Onset  . Stroke Mother   . Heart attack Father   . Diabetes Brother   . Heart disease Brother    History   Social History  . Marital Status: Married    Spouse Name: N/A  . Number of Children: N/A  . Years of Education: N/A   Social History Main Topics  . Smoking status: Former Smoker -- 1.00 packs/day for 15 years    Quit date: 11/03/1973  . Smokeless tobacco: Never Used  . Alcohol Use: 3.0 oz/week    6 Standard  drinks or equivalent per week     Comment: occ wine w/ supper  . Drug Use: No  . Sexual Activity: Yes    Birth Control/ Protection: Surgical     Comment: hysterectomy   Other Topics Concern  . None   Social History Narrative   Past Surgical History  Procedure Laterality Date  . Rotator cuff repair Left 1996; 1981  . Cholecystectomy    . Total hip arthroplasty Left 10/2000  . Total hip arthroplasty Right 06/2005  . Rotator cuff repair Right 1996  . Vaginal delivery      x1  . Rotator cuff  Bilateral 1996/1998    repair  . Lipoma excision Left 10/1999    left thigh  . Total hip arthroplasty Left     10/2000  . Lung biopsy  05/2001    /w Dr.Burney- bronchoscopy, mediastinoscopy  . Lumbosacral cyst  2005    seen on injection for low back pain   . Appendectomy  1986    along with chole  . Breast biopsy Right 1997    sclerosing adenosis -Dr. Lucia Gaskins  . Abdominal hysterectomy  1982    BSO  . Tonsillectomy    . Anterior cervical decomp/discectomy fusion N/A 10/03/2014    Procedure: ANTERIOR CERVICAL DECOMPRESSION/DISCECTOMY FUSION 1 LEVEL,CERVICAL THREE-FOUR;  Surgeon: [name], MD;  Location: [facility];  Service: Neurosurgery;  Laterality: N/A;   Past Medical History  Diagnosis Date  . Lumbosacral spondylosis without myelopathy   . Pain in joint, upper arm   . Other chronic postoperative pain   . Spinal stenosis, lumbar region, without neurogenic claudication   . Osteoarthritis   . Thyroid disease     hypothyroidism  . Hypercholesteremia   . Cervical spinal stenosis   . Hypertension   . 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 Swallowing dysfunction 2015    especially on thin liquids, study done 01/2014  . Dysrhythmia     (?or extra beats) treated /w atenolol  . Pneumonia   . Neuromuscular disorder     esophageal spasms at times   . Melanoma     on left leg, wide excision   . History of blood transfusion    BP  138/73 mmHg  Pulse 70  Resp 14  SpO2 99%  LMP 11/03/1980  Opioid Risk Score:   Fall Risk Score: Moderate Fall Risk (6-13 points) (patient previously educated)`1  Depression screen PHQ 2/9  Depression screen PHQ 2/9 01/11/2015  Decreased Interest 0  Down, Depressed, Hopeless 0  PHQ - 2 Score 0  Altered sleeping 0  Tired, decreased energy 1  Change in appetite 0  Feeling bad or failure about yourself  0  Trouble concentrating 1  Moving slowly or fidgety/restless 0  Suicidal thoughts 0  PHQ-9 Score 2     Review of Systems  All other systems reviewed and are negative.      Objective:   Physical Exam  Constitutional: She is oriented to person, place, and time. She appears well-developed and well-nourished.  HENT:  Head: Normocephalic and atraumatic.  Neck: Normal range of motion. Neck supple.  Cardiovascular: Normal rate and regular rhythm.   Pulmonary/Chest: Effort normal and breath sounds normal.  Musculoskeletal:  Normal Muscle Bulk and Muscle Testing Reveals: Upper Extremities:Right: Full ROM and Muscle Strength 5/5 Left: Decreased ROM 90 Degrees and Muscle Strength 4/5 Back without spinal or paraspinal tenderness Lower Extremities: Full ROM and Muscle strength 5/5 Arises from chair with ease Narrow Based gait  Neurological: She is alert and oriented to person, place, and time.  Skin: Skin is warm and dry.  Psychiatric: She has a normal mood and affect.  Nursing note and vitals reviewed.         Assessment & Plan:  1. Chronic low back pain with lumbar spondylosis.  Refilled: Meperidine 50 mg one tablet twice a day #60  2. Cervical stenosis with radiculitis : S/P Anterior Cervical Fusion C3-C4 with Dr. Ellene Route on 10/03/2014 3. Chronic bilateral shoulder pain: Continue with Current medication regime. Surgery Pending with Dr. Ludwig Clarks Following.  20 minutes of face to face patient care time was spent during this visit. All questions was encouraged and  answered.   F/U in 1 month

## 2015-02-11 NOTE — Progress Notes (Signed)
Encounter reviewed by Dr. Brook Silva.  

## 2015-03-07 ENCOUNTER — Encounter: Payer: Medicare Other | Attending: Physical Medicine and Rehabilitation | Admitting: Registered Nurse

## 2015-03-07 ENCOUNTER — Encounter: Payer: Self-pay | Admitting: Registered Nurse

## 2015-03-07 VITALS — BP 144/77 | HR 68 | Resp 14

## 2015-03-07 DIAGNOSIS — M47816 Spondylosis without myelopathy or radiculopathy, lumbar region: Secondary | ICD-10-CM | POA: Insufficient documentation

## 2015-03-07 DIAGNOSIS — M47812 Spondylosis without myelopathy or radiculopathy, cervical region: Secondary | ICD-10-CM | POA: Diagnosis present

## 2015-03-07 DIAGNOSIS — Z981 Arthrodesis status: Secondary | ICD-10-CM

## 2015-03-07 DIAGNOSIS — Z79899 Other long term (current) drug therapy: Secondary | ICD-10-CM | POA: Insufficient documentation

## 2015-03-07 DIAGNOSIS — M12811 Other specific arthropathies, not elsewhere classified, right shoulder: Secondary | ICD-10-CM | POA: Diagnosis present

## 2015-03-07 DIAGNOSIS — M12812 Other specific arthropathies, not elsewhere classified, left shoulder: Secondary | ICD-10-CM | POA: Diagnosis present

## 2015-03-07 DIAGNOSIS — M4712 Other spondylosis with myelopathy, cervical region: Secondary | ICD-10-CM | POA: Diagnosis not present

## 2015-03-07 DIAGNOSIS — M25512 Pain in left shoulder: Secondary | ICD-10-CM

## 2015-03-07 DIAGNOSIS — Z5181 Encounter for therapeutic drug level monitoring: Secondary | ICD-10-CM | POA: Diagnosis not present

## 2015-03-07 MED ORDER — MEPERIDINE HCL 50 MG PO TABS: 50.0000 mg | ORAL_TABLET | Freq: Two times a day (BID) | ORAL | Status: DC

## 2015-03-07 MED ORDER — PROMETHAZINE HCL 25 MG PO TABS: ORAL_TABLET | ORAL | Status: DC

## 2015-03-07 NOTE — Progress Notes (Signed)
Subjective:    Patient ID: Jenna [name], female    DOB: 18-Oct-1933, 81 y.o.   MRN: [id number]  HPI: [name] is a 81 year old female who returns for follow up for chronic pain and medication refill. She says her pain is located in her neck and left shoulder. She rates her pain 2. Her current exercise regime is performing stretching exercises and walking short distances.  S/P anterior cervical fusion with Dr. Ellene Route on 10/03/2014.  Demerol script discarded due to error entry. Pain Inventory Average Pain 3 Pain Right Now 2 My pain is intermittent, sharp and aching  In the last 24 hours, has pain interfered with the following? General activity 4 Relation with others 1 Enjoyment of life 3 What TIME of day is your pain at its worst? daytime and evening Sleep (in general) Good  Pain is worse with: walking, standing and some activites Pain improves with: rest, medication and injections Relief from Meds: 4  Mobility walk without assistance how many minutes can you walk? 15-20 ability to climb steps?  yes do you drive?  yes  Function retired  Neuro/Psych No problems in this area  Prior Studies Any changes since last visit?  no  Physicians involved in your care Any changes since last visit?  no   Family History  Problem Relation Age of Onset  . Stroke Mother   . Heart attack Father   . Diabetes Brother   . Heart disease Brother    History   Social History  . Marital Status: Married    Spouse Name: N/A  . Number of Children: N/A  . Years of Education: N/A   Social History Main Topics  . Smoking status: Former Smoker -- 1.00 packs/day for 15 years    Quit date: 11/03/1973  . Smokeless tobacco: Never Used  . Alcohol Use: 3.0 oz/week    6 Standard drinks or equivalent per week     Comment: occ wine w/ supper  . Drug Use: No  . Sexual Activity: Yes    Birth Control/ Protection: Surgical     Comment: hysterectomy   Other Topics Concern  . None    Social History Narrative   Past Surgical History  Procedure Laterality Date  . Rotator cuff repair Left 1996; 1981  . Cholecystectomy    . Total hip arthroplasty Left 10/2000  . Total hip arthroplasty Right 06/2005  . Rotator cuff repair Right 1996  . Vaginal delivery      x1  . Rotator cuff  Bilateral 1996/1998    repair  . Lipoma excision Left 10/1999    left thigh  . Total hip arthroplasty Left     10/2000  . Lung biopsy  05/2001    /w Dr.Burney- bronchoscopy, mediastinoscopy  . Lumbosacral cyst  2005    seen on injection for low back pain   . Appendectomy  1986    along with chole  . Breast biopsy Right 1997    sclerosing adenosis -Dr. Lucia Gaskins  . Abdominal hysterectomy  1982    BSO  . Tonsillectomy    . Anterior cervical decomp/discectomy fusion N/A 10/03/2014    Procedure: ANTERIOR CERVICAL DECOMPRESSION/DISCECTOMY FUSION 1 LEVEL,CERVICAL THREE-FOUR;  Surgeon: [name], MD;  Location: [place] [facility];  Service: Neurosurgery;  Laterality: N/A;   Past Medical History  Diagnosis Date  . Lumbosacral spondylosis without myelopathy   . Pain in joint, upper arm   . Other chronic postoperative pain   .  Spinal stenosis, lumbar region, without neurogenic claudication   . Osteoarthritis   . Thyroid disease     hypothyroidism  . Hypercholesteremia   . Cervical spinal stenosis   . Hypertension   . 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 Swallowing dysfunction 2015    especially on thin liquids, study done 01/2014  . Dysrhythmia     (?or extra beats) treated /w atenolol  . Pneumonia   . Neuromuscular disorder     esophageal spasms at times   . Melanoma     on left leg, wide excision   . History of blood transfusion    BP 144/77 mmHg  Pulse 68  Resp 14  SpO2 99%  LMP 11/03/1980  Opioid Risk Score:   Fall Risk Score: Low Fall Risk (0-5 points)`1  Depression screen PHQ 2/9  Depression screen PHQ 2/9 01/11/2015  Decreased  Interest 0  Down, Depressed, Hopeless 0  PHQ - 2 Score 0  Altered sleeping 0  Tired, decreased energy 1  Change in appetite 0  Feeling bad or failure about yourself  0  Trouble concentrating 1  Moving slowly or fidgety/restless 0  Suicidal thoughts 0  PHQ-9 Score 2     Review of Systems  Constitutional: Negative.   HENT: Negative.   Eyes: Negative.   Respiratory: Negative.   Cardiovascular: Negative.   Gastrointestinal: Negative.   Endocrine: Negative.   Genitourinary: Negative.   Musculoskeletal: Positive for myalgias and arthralgias.       Left shoulder pain  Skin: Negative.   Allergic/Immunologic: Negative.   Neurological: Negative.   Hematological: Negative.   Psychiatric/Behavioral: Negative.        Objective:   Physical Exam  Constitutional: She is oriented to person, place, and time. She appears well-developed and well-nourished.  HENT:  Head: Normocephalic and atraumatic.  Neck: Normal range of motion. Neck supple.  Cardiovascular: Normal rate and regular rhythm.   Pulmonary/Chest: Effort normal and breath sounds normal.  Musculoskeletal:  Normal Muscle Bulk and Muscle Testing Reveals: Upper Extremities: Right: Full ROM and Muscle Strength 5/5 Left: Decreased ROM 90 Degrees and Muscle Strength 5/5 Lower Extremities: Full ROM and Muscle Strength 5/5 Arises from chair with ease Narrow Based gait  Neurological: She is alert and oriented to person, place, and time.  Skin: Skin is warm and dry.  Psychiatric: She has a normal mood and affect.  Nursing note and vitals reviewed.         Assessment & Plan:  1. Chronic low back pain with lumbar spondylosis.  Refilled: Meperidine 50 mg one tablet twice a day #60  2. Cervical stenosis with radiculitis : S/P Anterior Cervical Fusion C3-C4 with Dr. Ellene Route on 10/03/2014 3. Chronic bilateral shoulder pain L>R: Continue with Current medication regime. Surgery Pending with Dr. Ludwig Clarks Following.  20  minutes of face to face patient care time was spent during this visit. All questions was encouraged and answered.   F/U in 1 month

## 2015-03-08 ENCOUNTER — Ambulatory Visit: Payer: Medicare Other | Admitting: Registered Nurse

## 2015-03-26 ENCOUNTER — Telehealth: Payer: Self-pay | Admitting: Emergency Medicine

## 2015-03-26 NOTE — Telephone Encounter (Signed)
Calling patient. Per Dr. Quincy Simmonds schedule patient for follow up breast check with Milford Cage, FNP for beginning of June. Patient agreeable, however, cannot schedule until end of June due to prior commitments.   Patient scheduled for 04/25/15 at 1415 (patient choice of date).   Continued Mammogram hold.

## 2015-03-27 ENCOUNTER — Emergency Department (HOSPITAL_COMMUNITY): Payer: Medicare Other

## 2015-03-27 ENCOUNTER — Encounter (HOSPITAL_COMMUNITY): Payer: Self-pay | Admitting: Cardiology

## 2015-03-27 ENCOUNTER — Emergency Department (HOSPITAL_COMMUNITY)
Admission: EM | Admit: 2015-03-27 | Discharge: 2015-03-27 | Disposition: A | Discharge status: Home / Self Care | Payer: Medicare Other | Attending: Emergency Medicine | Admitting: Emergency Medicine

## 2015-03-27 DIAGNOSIS — W01198A Fall on same level from slipping, tripping and stumbling with subsequent striking against other object, initial encounter: Secondary | ICD-10-CM | POA: Insufficient documentation

## 2015-03-27 DIAGNOSIS — T1490XA Injury, unspecified, initial encounter: Secondary | ICD-10-CM

## 2015-03-27 DIAGNOSIS — E039 Hypothyroidism, unspecified: Secondary | ICD-10-CM | POA: Diagnosis not present

## 2015-03-27 DIAGNOSIS — Z79899 Other long term (current) drug therapy: Secondary | ICD-10-CM | POA: Diagnosis not present

## 2015-03-27 DIAGNOSIS — Z8701 Personal history of pneumonia (recurrent): Secondary | ICD-10-CM | POA: Diagnosis not present

## 2015-03-27 DIAGNOSIS — S0093XA Contusion of unspecified part of head, initial encounter: Secondary | ICD-10-CM

## 2015-03-27 DIAGNOSIS — Z7982 Long term (current) use of aspirin: Secondary | ICD-10-CM | POA: Insufficient documentation

## 2015-03-27 DIAGNOSIS — Z85820 Personal history of malignant melanoma of skin: Secondary | ICD-10-CM | POA: Diagnosis not present

## 2015-03-27 DIAGNOSIS — S20221A Contusion of right back wall of thorax, initial encounter: Secondary | ICD-10-CM | POA: Diagnosis not present

## 2015-03-27 DIAGNOSIS — S199XXA Unspecified injury of neck, initial encounter: Secondary | ICD-10-CM | POA: Diagnosis present

## 2015-03-27 DIAGNOSIS — E780 Pure hypercholesterolemia: Secondary | ICD-10-CM | POA: Diagnosis not present

## 2015-03-27 DIAGNOSIS — Z87891 Personal history of nicotine dependence: Secondary | ICD-10-CM | POA: Insufficient documentation

## 2015-03-27 DIAGNOSIS — Z7951 Long term (current) use of inhaled steroids: Secondary | ICD-10-CM | POA: Diagnosis not present

## 2015-03-27 DIAGNOSIS — Y998 Other external cause status: Secondary | ICD-10-CM | POA: Insufficient documentation

## 2015-03-27 DIAGNOSIS — G8928 Other chronic postprocedural pain: Secondary | ICD-10-CM | POA: Diagnosis not present

## 2015-03-27 DIAGNOSIS — Y9289 Other specified places as the place of occurrence of the external cause: Secondary | ICD-10-CM | POA: Insufficient documentation

## 2015-03-27 DIAGNOSIS — S0083XA Contusion of other part of head, initial encounter: Secondary | ICD-10-CM | POA: Diagnosis not present

## 2015-03-27 DIAGNOSIS — Y9389 Activity, other specified: Secondary | ICD-10-CM | POA: Diagnosis not present

## 2015-03-27 DIAGNOSIS — M1990 Unspecified osteoarthritis, unspecified site: Secondary | ICD-10-CM | POA: Insufficient documentation

## 2015-03-27 DIAGNOSIS — I10 Essential (primary) hypertension: Secondary | ICD-10-CM | POA: Diagnosis not present

## 2015-03-27 DIAGNOSIS — S1093XA Contusion of unspecified part of neck, initial encounter: Secondary | ICD-10-CM

## 2015-03-27 NOTE — ED Notes (Signed)
Pt reports she fell backwards and hit her back and shoulders on a table and broke it. Pt reports neck pain and shoulder pain at this time. Denies any LOC. No blood thinners.

## 2015-03-27 NOTE — ED Provider Notes (Signed)
CSN: [id number]     Arrival date & time 03/27/15  1713 History   First MD Initiated Contact with Patient 03/27/15 2117     Chief Complaint  Patient presents with  . Fall  . Neck Pain     (Consider location/radiation/quality/duration/timing/severity/associated sxs/prior Treatment) HPI Comments: The pt is an 81 y/o female who had cervical spine surgery in 12/15, she reports that she had a mechanical fall today when she turned to let the dog past, got her foot caught on the carpet and subesquently fell backwards striking something with her head, neck and upper back.  This occurred just prior to arrival, is constant, mild and not associate with LOC, headache, n/v or any neuro c/o.  She has no blurred vision, speech difficulty, weakness, numbness or difficulty with ambulation - no pain with deep breathing.  Patient is a 81 y.o. female presenting with fall and neck pain. The history is provided by the patient.  Fall  Neck Pain   Past Medical History  Diagnosis Date  . Lumbosacral spondylosis without myelopathy   . Pain in joint, upper arm   . Other chronic postoperative pain   . Spinal stenosis, lumbar region, without neurogenic claudication   . Osteoarthritis   . Thyroid disease     hypothyroidism  . Hypercholesteremia   . Cervical spinal stenosis   . Hypertension   . 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 Swallowing dysfunction 2015    especially on thin liquids, study done 01/2014  . Dysrhythmia     (?or extra beats) treated /w atenolol  . Pneumonia   . Neuromuscular disorder     esophageal spasms at times   . Melanoma     on left leg, wide excision   . History of blood transfusion    Past Surgical History  Procedure Laterality Date  . Rotator cuff repair Left 1996; 1981  . Cholecystectomy    . Total hip arthroplasty Left 10/2000  . Total hip arthroplasty Right 06/2005  . Rotator cuff repair Right 1996  . Vaginal delivery       x1  . Rotator cuff  Bilateral 1996/1998    repair  . Lipoma excision Left 10/1999    left thigh  . Total hip arthroplasty Left     10/2000  . Lung biopsy  05/2001    /w Dr.Burney- bronchoscopy, mediastinoscopy  . Lumbosacral cyst  2005    seen on injection for low back pain   . Appendectomy  1986    along with chole  . Breast biopsy Right 1997    sclerosing adenosis -Dr. Lucia Gaskins  . Abdominal hysterectomy  1982    BSO  . Tonsillectomy    . Anterior cervical decomp/discectomy fusion N/A 10/03/2014    Procedure: ANTERIOR CERVICAL DECOMPRESSION/DISCECTOMY FUSION 1 LEVEL,CERVICAL THREE-FOUR;  Surgeon: [name], MD;  Location: [facility];  Service: Neurosurgery;  Laterality: N/A;   Family History  Problem Relation Age of Onset  . Stroke Mother   . Heart attack Father   . Diabetes Brother   . Heart disease Brother    History  Substance Use Topics  . Smoking status: Former Smoker -- 1.00 packs/day for 15 years    Quit date: 11/03/1973  . Smokeless tobacco: Never Used  . Alcohol Use: 3.0 oz/week    6 Standard drinks or equivalent per week     Comment: occ wine w/ supper   OB History  Gravida Para Term Preterm AB TAB SAB Ectopic Multiple Living   1 1        1      Review of Systems  Musculoskeletal: Positive for neck pain.  All other systems reviewed and are negative.     Allergies  Codeine; Darvon; Pseudoephedrine hcl; Tylenol; Ultram; Vicodin; [place]mane; Cephalexin; Clarithromycin; and Doxycycline calcium  Home Medications   Prior to Admission medications   Medication Sig Start Date End Date Taking? Authorizing Provider  aspirin 81 MG tablet Take 81 mg by mouth daily.    Yes Historical Provider, MD  atenolol (TENORMIN) 25 MG tablet Take 25 mg by mouth 2 (two) times daily.    Yes Historical Provider, MD  atorvastatin (LIPITOR) 80 MG tablet Take 40 mg by mouth daily.   Yes Historical Provider, MD  calcium carbonate (OS-CAL) 600 MG TABS Take 600 mg by mouth 2 (two)  times daily.   Yes Historical Provider, MD  chlorthalidone (HYGROTON) 25 MG tablet Take 25 mg by mouth daily.   Yes Historical Provider, MD  cholecalciferol (VITAMIN D) 1000 UNITS tablet Take 1,000 Units by mouth daily.   Yes Historical Provider, MD  clidinium-chlordiazePOXIDE (LIBRAX) 5-2.5 MG per capsule Take 1 capsule by mouth 3 (three) times daily as needed (for IBS).   Yes Historical Provider, MD  conjugated estrogens (PREMARIN) vaginal cream Use 1/2 g vaginally twice weekly Patient taking differently: Place 1 Applicatorful vaginally every Sunday. Use 1/2 g vaginally 06/30/14  Yes Kem Boroughs, FNP  diazepam (VALIUM) 5 MG tablet Take 1 tablet by mouth at bedtime as needed for anxiety.  02/05/15  Yes Historical Provider, MD  estradiol (VIVELLE-DOT) 0.025 MG/24HR Use 1 patch twice weekly Patient taking differently: Place 1 patch onto the skin every Sunday.  08/10/14  Yes Kem Boroughs, FNP  fexofenadine (ALLEGRA) 180 MG tablet Take 180 mg by mouth daily.   Yes Historical Provider, MD  fluticasone (FLONASE) 50 MCG/ACT nasal spray Place 2 sprays into both nostrils 2 (two) times daily.   Yes Historical Provider, MD  hyoscyamine (LEVSIN SL) 0.125 MG SL tablet Place 0.125-0.25 mg under the tongue every 4 (four) hours as needed for cramping.    Yes Historical Provider, MD  ibuprofen (ADVIL,MOTRIN) 200 MG tablet Take 400 mg by mouth every 6 (six) hours as needed (for pain).    Yes Historical Provider, MD  levothyroxine (SYNTHROID, LEVOTHROID) 112 MCG tablet Take 112 mcg by mouth daily before breakfast.   Yes Historical Provider, MD  losartan (COZAAR) 50 MG tablet Take 50 mg by mouth daily.   Yes Historical Provider, MD  meperidine (DEMEROL) 50 MG tablet Take 1 tablet (50 mg total) by mouth 2 (two) times daily. 03/07/15  Yes Bayard Hugger, NP  methocarbamol (ROBAXIN) 500 MG tablet Take 1 tablet (500 mg total) by mouth every 6 (six) hours as needed for muscle spasms. 10/04/14  Yes [name], MD  Multiple  Vitamin (MULTIVITAMIN) tablet Take 1 tablet by mouth daily.   Yes Historical Provider, MD  omeprazole (PRILOSEC) 40 MG capsule Take 40 mg by mouth 2 (two) times daily.    Yes Historical Provider, MD  promethazine (PHENERGAN) 25 MG tablet Take one tablet twice daily AS NEEDED Patient taking differently: Take 25 mg by mouth every 6 (six) hours as needed for nausea. For nausea and vomiting 03/07/15  Yes Bayard Hugger, NP   BP 149/57 mmHg  Pulse 67  Temp(Src) 97.9 F (36.6 C) (Oral)  Resp 16  SpO2 99%  LMP 11/03/1980 Physical Exam  Constitutional: She appears well-developed and well-nourished. No distress.  HENT:  Head: Normocephalic and atraumatic.  Mouth/Throat: Oropharynx is clear and moist. No oropharyngeal exudate.  no facial tenderness, deformity, malocclusion or hemotympanum.  no battle's sign or racoon eyes.   Eyes: Conjunctivae and EOM are normal. Pupils are equal, round, and reactive to light. Right eye exhibits no discharge. Left eye exhibits no discharge. No scleral icterus.  Neck: Normal range of motion. Neck supple. No JVD present. No thyromegaly present.  Cardiovascular: Normal rate, regular rhythm, normal heart sounds and intact distal pulses.  Exam reveals no gallop and no friction rub.   No murmur heard. Pulmonary/Chest: Effort normal and breath sounds normal. No respiratory distress. She has no wheezes. She has no rales.  Musculoskeletal: Normal range of motion. She exhibits tenderness ( mild ttp over the upper thoracic spine - mild paraspinal muscle ttp, mild bruise to the R mid back, no ttp over this area.). She exhibits no edema.  Lymphadenopathy:    She has no cervical adenopathy.  Neurological: She is alert. Coordination normal.  Normal gait, normal coordination, normal speech.  Skin: Skin is warm and dry. No rash noted. No erythema.  Psychiatric: She has a normal mood and affect. Her behavior is normal.  Nursing note and vitals reviewed.   ED Course  Procedures  (including critical care time) Labs Review Labs Reviewed - No data to display  Imaging Review Dg Cervical Spine 2-3 Views  03/27/2015   CLINICAL DATA:  Fall, neck pain  EXAM: CERVICAL SPINE - 2-3 VIEW  COMPARISON:  10/25/2014  FINDINGS: Three views of cervical spine submitted. No acute fracture or subluxation. Again noted anterior metallic fixation plate at G2-I9 level. Stable about 2.2 mm anterolisthesis C4 on C5 vertebral body. Stable disc space flattening with anterior spurring and endplate sclerotic changes at C5-C6 and C6-C7 level. Stable about 2 mm anterolisthesis C7 on T1 vertebral body. No prevertebral soft tissue swelling. Cervical airway is patent.  IMPRESSION: No acute fracture or subluxation. Stable degenerative changes and postsurgical changes as described above.   Electronically Signed   By: Lahoma Crocker M.D.   On: 03/27/2015 22:32   Dg Thoracic Spine W/swimmers  03/27/2015   CLINICAL DATA:  Injury post fall  EXAM: THORACIC SPINE - 2 VIEW + SWIMMERS  COMPARISON:  06/23/2005  FINDINGS: Three views of thoracic spine submitted. No acute fracture or subluxation. Mild lumbar levoscoliosis. Mild degenerative changes with anterior spurring mid and lower thoracic spine.  IMPRESSION: No acute fracture or subluxation.  Mild degenerative changes.   Electronically Signed   By: Lahoma Crocker M.D.   On: 03/27/2015 22:33      MDM   Final diagnoses:  Trauma  Contusion of head, initial encounter  Contusion of neck, initial encounter    No overt signs of head trauma, no sx to suggest need for CT head, will get plain film imaging of the neck and upper spine as she is concerned about injuring recent area where she had surgery - no neuro defecits.  Normal UE exam bialterally.  Declines pain medicine.  xray's neg for frx, pt informed - stable for d/c.  Noemi Chapel, MD 03/27/15 2239

## 2015-03-27 NOTE — Discharge Instructions (Signed)
Please call your doctor for a followup appointment within 24-48 hours. When you talk to your doctor please let them know that you were seen in the emergency department and have them acquire all of your records so that they can discuss the findings with you and formulate a treatment plan to fully care for your new and ongoing problems. ° °

## 2015-03-29 ENCOUNTER — Encounter: Payer: Self-pay | Admitting: Nurse Practitioner

## 2015-04-03 ENCOUNTER — Encounter (HOSPITAL_BASED_OUTPATIENT_CLINIC_OR_DEPARTMENT_OTHER): Payer: Medicare Other | Admitting: Registered Nurse

## 2015-04-03 ENCOUNTER — Encounter: Payer: Self-pay | Admitting: Registered Nurse

## 2015-04-03 VITALS — BP 134/69 | HR 69 | Resp 14

## 2015-04-03 DIAGNOSIS — M25512 Pain in left shoulder: Secondary | ICD-10-CM | POA: Diagnosis not present

## 2015-04-03 DIAGNOSIS — Z79899 Other long term (current) drug therapy: Secondary | ICD-10-CM

## 2015-04-03 DIAGNOSIS — M546 Pain in thoracic spine: Secondary | ICD-10-CM

## 2015-04-03 DIAGNOSIS — M47816 Spondylosis without myelopathy or radiculopathy, lumbar region: Secondary | ICD-10-CM | POA: Diagnosis not present

## 2015-04-03 DIAGNOSIS — Z5181 Encounter for therapeutic drug level monitoring: Secondary | ICD-10-CM

## 2015-04-03 DIAGNOSIS — Z981 Arthrodesis status: Secondary | ICD-10-CM

## 2015-04-03 DIAGNOSIS — M4712 Other spondylosis with myelopathy, cervical region: Secondary | ICD-10-CM

## 2015-04-03 MED ORDER — MEPERIDINE HCL 50 MG PO TABS: 50.0000 mg | ORAL_TABLET | Freq: Two times a day (BID) | ORAL | Status: DC

## 2015-04-03 NOTE — Progress Notes (Signed)
Subjective:    Patient ID: Jenna [name], female    DOB: Jul 28, 1933, 81 y.o.   MRN: [id number]  HPI: [name] is a 81 year old female who returns for follow up for chronic pain and medication refill. She says her pain is located in her left shoulder and mid-back. She rates her pain 3. Her current exercise regime is performing stretching exercises and walking short distances. She had a  Fall on 5/24/206, her dog bumped into her leg as she was entering the backdoor , she lost her balance fell backwards and hit her head also felt  her neck jerked. She went to [facility] ED for Evaluation. She had a cervical spine x-ray and thoracic: Results as follows:MPRESSION: Cervical Spine No acute fracture or subluxation. Stable degenerative changes andpostsurgical changes as described above.  Thoracic Spine: IMPRESSION: No acute fracture or subluxation. Mild degenerative changes.   S/P anterior cervical fusion with Dr. Ellene Route on 10/03/2014.  Pain Inventory Average Pain 4 Pain Right Now 3 My pain is intermittent, sharp and aching  In the last 24 hours, has pain interfered with the following? General activity 5 Relation with others 1 Enjoyment of life 5 What TIME of day is your pain at its worst? daytime, evening Sleep (in general) Good  Pain is worse with: standing and some activites Pain improves with: rest, pacing activities, medication and injections Relief from Meds: 5  Mobility walk without assistance how many minutes can you walk? 15-20 ability to climb steps?  yes do you drive?  yes Do you have any goals in this area?  yes  Function retired Do you have any goals in this area?  no  Neuro/Psych No problems in this area  Prior Studies Any changes since last visit?  yes x-rays  Physicians involved in your care Any changes since last visit?  no   Family History  Problem Relation Age of Onset  . Stroke Mother   . Heart attack Father   . Diabetes Brother    . Heart disease Brother    History   Social History  . Marital Status: Married    Spouse Name: N/A  . Number of Children: N/A  . Years of Education: N/A   Social History Main Topics  . Smoking status: Former Smoker -- 1.00 packs/day for 15 years    Quit date: 11/03/1973  . Smokeless tobacco: Never Used  . Alcohol Use: 3.0 oz/week    6 Standard drinks or equivalent per week     Comment: occ wine w/ supper  . Drug Use: No  . Sexual Activity: Yes    Birth Control/ Protection: Surgical     Comment: hysterectomy   Other Topics Concern  . None   Social History Narrative   Past Surgical History  Procedure Laterality Date  . Rotator cuff repair Left 1996; 1981  . Cholecystectomy    . Total hip arthroplasty Left 10/2000  . Total hip arthroplasty Right 06/2005  . Rotator cuff repair Right 1996  . Vaginal delivery      x1  . Rotator cuff  Bilateral 1996/1998    repair  . Lipoma excision Left 10/1999    left thigh  . Total hip arthroplasty Left     10/2000  . Lung biopsy  05/2001    /w Dr.Burney- bronchoscopy, mediastinoscopy  . Lumbosacral cyst  2005    seen on injection for low back pain   . Appendectomy  1986  along with chole  . Breast biopsy Right 1997    sclerosing adenosis -Dr. Lucia Gaskins  . Abdominal hysterectomy  1982    BSO  . Tonsillectomy    . Anterior cervical decomp/discectomy fusion N/A 10/03/2014    Procedure: ANTERIOR CERVICAL DECOMPRESSION/DISCECTOMY FUSION 1 LEVEL,CERVICAL THREE-FOUR;  Surgeon: [name], MD;  Location: [place] [facility];  Service: Neurosurgery;  Laterality: N/A;   Past Medical History  Diagnosis Date  . Lumbosacral spondylosis without myelopathy   . Pain in joint, upper arm   . Other chronic postoperative pain   . Spinal stenosis, lumbar region, without neurogenic claudication   . Osteoarthritis   . Thyroid disease     hypothyroidism  . Hypercholesteremia   . Cervical spinal stenosis   . Hypertension   . Sarcoidosis of lung  05/2001    developed into pneumonia, resolved within a year  . Swallowing dysfunction 2015    especially on thin liquids, study done 01/2014  . Dysrhythmia     (?or extra beats) treated /w atenolol  . Pneumonia   . Neuromuscular disorder     esophageal spasms at times   . Melanoma     on left leg, wide excision   . History of blood transfusion    BP 134/69 mmHg  Pulse 69  Resp 14  SpO2 97%  LMP 11/03/1980  Opioid Risk Score:   Fall Risk Score: Moderate Fall Risk (6-13 points) (patient previously educated)`1  Depression screen PHQ 2/9  Depression screen PHQ 2/9 01/11/2015  Decreased Interest 0  Down, Depressed, Hopeless 0  PHQ - 2 Score 0  Altered sleeping 0  Tired, decreased energy 1  Change in appetite 0  Feeling bad or failure about yourself  0  Trouble concentrating 1  Moving slowly or fidgety/restless 0  Suicidal thoughts 0  PHQ-9 Score 2     Review of Systems  All other systems reviewed and are negative.      Objective:   Physical Exam  Constitutional: She is oriented to person, place, and time. She appears well-developed and well-nourished.  HENT:  Head: Normocephalic and atraumatic.  Neck: Normal range of motion. Neck supple.  Cardiovascular: Normal rate and regular rhythm.   Pulmonary/Chest: Effort normal and breath sounds normal.  Musculoskeletal:  Normal Muscle Bulk and Muscle Testing Reveals: Upper Extremities:Right: Full ROM and Muscle Strength 5/5 Left: Decreased ROM 90 Degrees and Muscle Strength 5/5 Thoracic Paraspinal Tenderness/ Ecchymosis Noted T-7-T-8 Lower Extremities: Full ROM and Muscle Strength 5/5 Arises from chair with ease Narrow Based Gait   Neurological: She is alert and oriented to person, place, and time.  Skin: Skin is warm and dry.  Psychiatric: She has a normal mood and affect.  Nursing note and vitals reviewed.         Assessment & Plan:  1. Chronic low back pain with lumbar spondylosis.  Refilled: Meperidine 50  mg one tablet twice a day #60  2. Cervical stenosis with radiculitis : S/P Anterior Cervical Fusion C3-C4 with Dr. Ellene Route on 10/03/2014 3. Chronic bilateral shoulder pain L>R: Continue with Current medication regime. Surgery Pending with Dr. Ludwig Clarks Following. Schedule for Cortisone injection this week with Orthopedics  20 minutes of face to face patient care time was spent during this visit. All questions was encouraged and answered.   F/U in 1 month

## 2015-04-04 ENCOUNTER — Ambulatory Visit: Payer: Medicare Other | Admitting: Registered Nurse

## 2015-04-25 ENCOUNTER — Ambulatory Visit (INDEPENDENT_AMBULATORY_CARE_PROVIDER_SITE_OTHER): Payer: Medicare Other | Admitting: Nurse Practitioner

## 2015-04-25 ENCOUNTER — Encounter: Payer: Self-pay | Admitting: Nurse Practitioner

## 2015-04-25 VITALS — BP 118/76 | HR 76 | Resp 20 | Ht 62.0 in | Wt 152.0 lb

## 2015-04-25 DIAGNOSIS — N644 Mastodynia: Secondary | ICD-10-CM

## 2015-04-25 NOTE — Patient Instructions (Addendum)
  Will make apt for you to see breast surgeon.

## 2015-04-25 NOTE — Progress Notes (Signed)
   Subjective:   81 y.o. Married white female G1 P1 presents for re-evaluation of left breast mass. Onset of the symptoms was in March. Patient sought evaluation as a recheck since her Mammogram and [place] on the left was normal on 02/19/15.  She can still feel the area of concern.  She normally always has breast tenderness that she feels is related to caffeine intake but not particularly sore at the area of concern.  She has a bad shoulder that needs replacement on the left but notes no other trauma to the left breast.   Contributing factors include none. Denies anorexia, chills, fatigue, fevers, malaise, night sweats and weight loss. Patient denies history of trauma, bites, or injuries. Last mammogram was 02/2015.  Previous evaluation has included breast biopsy on the right 1997 by Dr. Alphonsa Overall reveals sclerosing adenosis.   She remains on ERT - but spreading the patches out to weekly. Also estrogen vaginal to once a week.   Review of Systems A comprehensive review of systems was negative.   Objective:   General appearance: alert, cooperative, appears stated age and no distress Head: Normocephalic, without obvious abnormality, atraumatic Neck: no adenopathy, supple, symmetrical, trachea midline and thyroid not enlarged, symmetric, no tenderness/mass/nodules Back: symmetric, no curvature. ROM normal. No CVA tenderness. Lungs: clear to auscultation bilaterally Breasts: positive findings: fibrocystic changes and smooth, round and mobile nodule located on the left upper outer quadrant  Patient is also seen in consult by Dr. Quincy Simmonds. Heart: regular rate and rhythm Abdomen: normal findings: no organomegaly    Assessment:   ASSESSMENT:Patient is diagnosed with breast mass - likely benign   Plan:   PLAN: The patient has a documented plan to follow with further care of surgical evaluation and will make that consult visit. 2. PLAN: FOLLOW after seen by surgeon.

## 2015-04-28 NOTE — Progress Notes (Signed)
Encounter reviewed by Dr. Josefa Half.  Patient examined. Will have triage nurse confirm appointment with surgeon.

## 2015-04-30 ENCOUNTER — Other Ambulatory Visit: Payer: Self-pay

## 2015-04-30 ENCOUNTER — Other Ambulatory Visit: Payer: Self-pay | Admitting: Registered Nurse

## 2015-04-30 ENCOUNTER — Telehealth: Payer: Self-pay | Admitting: Registered Nurse

## 2015-04-30 ENCOUNTER — Encounter: Payer: Self-pay | Admitting: Registered Nurse

## 2015-04-30 ENCOUNTER — Encounter: Payer: Medicare Other | Attending: Physical Medicine and Rehabilitation | Admitting: Registered Nurse

## 2015-04-30 VITALS — BP 140/76 | HR 68 | Resp 14

## 2015-04-30 DIAGNOSIS — Z79899 Other long term (current) drug therapy: Secondary | ICD-10-CM | POA: Insufficient documentation

## 2015-04-30 DIAGNOSIS — M47816 Spondylosis without myelopathy or radiculopathy, lumbar region: Secondary | ICD-10-CM | POA: Insufficient documentation

## 2015-04-30 DIAGNOSIS — M12812 Other specific arthropathies, not elsewhere classified, left shoulder: Secondary | ICD-10-CM | POA: Diagnosis present

## 2015-04-30 DIAGNOSIS — M47812 Spondylosis without myelopathy or radiculopathy, cervical region: Secondary | ICD-10-CM | POA: Insufficient documentation

## 2015-04-30 DIAGNOSIS — G894 Chronic pain syndrome: Secondary | ICD-10-CM | POA: Diagnosis not present

## 2015-04-30 DIAGNOSIS — M25512 Pain in left shoulder: Secondary | ICD-10-CM

## 2015-04-30 DIAGNOSIS — M12811 Other specific arthropathies, not elsewhere classified, right shoulder: Secondary | ICD-10-CM | POA: Insufficient documentation

## 2015-04-30 DIAGNOSIS — Z5181 Encounter for therapeutic drug level monitoring: Secondary | ICD-10-CM | POA: Diagnosis present

## 2015-04-30 MED ORDER — MEPERIDINE HCL 50 MG PO TABS: 50.0000 mg | ORAL_TABLET | Freq: Two times a day (BID) | ORAL | Status: DC

## 2015-04-30 NOTE — Progress Notes (Signed)
Subjective:    Patient ID: Jenna [name], female    DOB: 23-Dec-1932, 81 y.o.   MRN: [id number]  HPI: [name] is a 81 year old female who returns for follow up for chronic pain and medication refill. She says her pain is located in her left shoulder and lower back. She rates her pain 2. Her current exercise regime is walking on treadmill for 20- 30 minutes three days a week. Also states she is schedule for left FNB with Dr. Donne Hazel next month.  Pain Inventory Average Pain 4 Pain Right Now 2 My pain is intermittent, sharp and aching  In the last 24 hours, has pain interfered with the following? General activity 3 Relation with others 1 Enjoyment of life 4 What TIME of day is your pain at its worst? daytime and evening Sleep (in general) Good  Pain is worse with: standing and some activites Pain improves with: rest, pacing activities, medication and injections Relief from Meds: 6  Mobility walk without assistance how many minutes can you walk? 20 ability to climb steps?  yes do you drive?  yes  Function retired  Neuro/Psych weakness numbness  Prior Studies Any changes since last visit?  no  Physicians involved in your care Any changes since last visit?  no   Family History  Problem Relation Age of Onset  . Stroke Mother   . Heart attack Father   . Diabetes Brother   . Heart disease Brother    History   Social History  . Marital Status: Married    Spouse Name: N/A  . Number of Children: N/A  . Years of Education: N/A   Social History Main Topics  . Smoking status: Former Smoker -- 1.00 packs/day for 15 years    Quit date: 11/03/1973  . Smokeless tobacco: Never Used  . Alcohol Use: 3.6 oz/week    6 Standard drinks or equivalent per week     Comment: occ wine w/ supper  . Drug Use: No  . Sexual Activity:    Partners: Male    Birth Control/ Protection: Surgical     Comment: hysterectomy   Other Topics Concern  . None   Social  History Narrative   Past Surgical History  Procedure Laterality Date  . Rotator cuff repair Left 1996; 1981  . Cholecystectomy    . Total hip arthroplasty Left 10/2000  . Total hip arthroplasty Right 06/2005  . Rotator cuff repair Right 1996  . Vaginal delivery      x1  . Rotator cuff  Bilateral 1996/1998    repair  . Lipoma excision Left 10/1999    left thigh  . Total hip arthroplasty Left     10/2000  . Lung biopsy  05/2001    /w Dr.Burney- bronchoscopy, mediastinoscopy  . Lumbosacral cyst  2005    seen on injection for low back pain   . Appendectomy  1986    along with chole  . Breast biopsy Right 1997    sclerosing adenosis -Dr. Lucia Gaskins  . Abdominal hysterectomy  1982    BSO  . Tonsillectomy    . Anterior cervical decomp/discectomy fusion N/A 10/03/2014    Procedure: ANTERIOR CERVICAL DECOMPRESSION/DISCECTOMY FUSION 1 LEVEL,CERVICAL THREE-FOUR;  Surgeon: [name], MD;  Location: [place] [facility];  Service: Neurosurgery;  Laterality: N/A;   Past Medical History  Diagnosis Date  . Lumbosacral spondylosis without myelopathy   . Pain in joint, upper arm   . Other chronic postoperative  pain   . Spinal stenosis, lumbar region, without neurogenic claudication   . Osteoarthritis   . Thyroid disease     hypothyroidism  . Hypercholesteremia   . Cervical spinal stenosis   . Hypertension   . 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 Swallowing dysfunction 2015    especially on thin liquids, study done 01/2014  . Dysrhythmia     (?or extra beats) treated /w atenolol  . Pneumonia   . Neuromuscular disorder     esophageal spasms at times   . Melanoma     on left leg, wide excision   . History of blood transfusion    BP 140/76 mmHg  Pulse 68  Resp 14  SpO2 98%  LMP 11/03/1980  Opioid Risk Score:   Fall Risk Score: Moderate Fall Risk (6-13 points)`1  Depression screen PHQ 2/9  Depression screen PHQ 2/9 01/11/2015  Decreased  Interest 0  Down, Depressed, Hopeless 0  PHQ - 2 Score 0  Altered sleeping 0  Tired, decreased energy 1  Change in appetite 0  Feeling bad or failure about yourself  0  Trouble concentrating 1  Moving slowly or fidgety/restless 0  Suicidal thoughts 0  PHQ-9 Score 2     Review of Systems  HENT: Negative.   Eyes: Negative.   Respiratory: Negative.   Cardiovascular: Negative.   Gastrointestinal: Negative.   Endocrine: Negative.   Genitourinary: Negative.   Musculoskeletal: Positive for myalgias, back pain, arthralgias and neck pain.  Allergic/Immunologic: Negative.   Neurological: Positive for weakness and numbness.       Right arm tingling  Hematological: Negative.   Psychiatric/Behavioral: Negative.        Objective:   Physical Exam  Constitutional: She is oriented to person, place, and time. She appears well-developed and well-nourished.  HENT:  Head: Normocephalic and atraumatic.  Neck: Normal range of motion. Neck supple.  Cardiovascular: Normal rate and regular rhythm.   Pulmonary/Chest: Effort normal and breath sounds normal.  Musculoskeletal:  Normal Muscle Bulk and  Muscle Testing Reveals: Upper Extremities: Right: Full ROM and Muscle Strength 5/5 Left: Decreased ROM: 90 Degrees and Muscle strength 5/5 Lower Extremities: Full ROM and Muscle Strength 5/5  Arises from chair with Ease Narrow Based Gait  Neurological: She is alert and oriented to person, place, and time.  Skin: Skin is warm and dry.  Psychiatric: She has a normal mood and affect.  Nursing note and vitals reviewed.         Assessment & Plan:  1. Chronic low back pain with lumbar spondylosis.  Refilled: Meperidine 50 mg one tablet twice a day #60. Second script given to accommodate scheduled appointment. 2. Cervical stenosis with radiculitis : S/P Anterior Cervical Fusion C3-C4 with Dr. Ellene Route on 10/03/2014 3. Chronic bilateral shoulder pain L>R: Continue with Current medication regime.  Surgery Pending with Dr. Veverly Fells [facility] Following. Schedule for Cortisone injection this week with Orthopedics  20 minutes of face to face patient care time was spent during this visit. All questions was encouraged and answered.   F/U in 1 month

## 2015-05-01 ENCOUNTER — Telehealth: Payer: Self-pay | Admitting: Emergency Medicine

## 2015-05-01 ENCOUNTER — Ambulatory Visit: Payer: Medicare Other | Admitting: Registered Nurse

## 2015-05-01 LAB — PMP ALCOHOL METABOLITE (ETG): Ethyl Glucuronide (EtG): NEGATIVE ng/mL

## 2015-05-01 NOTE — Telephone Encounter (Signed)
-----   Message from Kem Boroughs, [place] sent at 04/26/2015  7:52 PM EDT ----- Will you make pt an apt to see a breast surgeon for breast mass on the left with a normal mammogram and [place].  Afternoons are better for her.

## 2015-05-01 NOTE — Telephone Encounter (Signed)
-----   Message from Nunzio Cobbs, MD sent at 04/28/2015  9:01 AM EDT ----- Regarding: Keep in mammogram hold for left breast mass Olivia Mackie,   Please keep in mammogram hold. Persistent left breast mass.  Please check to see if patient has an appointment to see the breast surgeon.  I want to be sure that we understand her follow up plan clearly.   Thanks!

## 2015-05-04 LAB — MEPERIDINE (GC/LC/MS), URINE
Meperidine (GC/LC/MS), ur confirm: 267 ng/mL (ref <100)
Normeperidine (GC/LC/MS), ur confirm: >2500 ng/mL — AB (ref <100)

## 2015-05-04 LAB — BENZODIAZEPINES (GC/LC/MS), URINE
Alprazolam metabolite (GC/LC/MS), ur confirm: NEGATIVE ng/mL (ref <25)
CLONAZEPAU: NEGATIVE ng/mL (ref <25)
Flurazepam metabolite (GC/LC/MS), ur confirm: NEGATIVE ng/mL (ref <50)
LORAZEPAMU: NEGATIVE ng/mL (ref <50)
MIDAZOLAMU: NEGATIVE ng/mL (ref <50)
NORDIAZEPAMU: NEGATIVE ng/mL — AB (ref <50)
Oxazepam (GC/LC/MS), ur confirm: NEGATIVE ng/mL — AB (ref <50)
TRIAZOLAMU: NEGATIVE ng/mL (ref <50)
Temazepam (GC/LC/MS), ur confirm: NEGATIVE ng/mL — AB (ref <50)

## 2015-05-05 LAB — PRESCRIPTION MONITORING PROFILE (SOLSTAS)
Amphetamine/Meth: NEGATIVE ng/mL
BARBITURATE SCREEN, URINE: NEGATIVE ng/mL
BUPRENORPHINE, URINE: NEGATIVE ng/mL
CANNABINOID SCRN UR: NEGATIVE ng/mL
CREATININE, URINE: 27.97 mg/dL (ref >20.0)
Carisoprodol, Urine: NEGATIVE ng/mL
Cocaine Metabolites: NEGATIVE ng/mL
ECSTASY: NEGATIVE ng/mL
FENTANYL URINE: NEGATIVE ng/mL
Methadone Screen, Urine: NEGATIVE ng/mL
NITRITES URINE, INITIAL: NEGATIVE ug/mL
Opiate Screen, Urine: NEGATIVE ng/mL
Oxycodone Screen, Ur: NEGATIVE ng/mL
PH URINE, INITIAL: 7.5 pH (ref 4.5–8.9)
Propoxyphene: NEGATIVE ng/mL
Tapentadol, urine: NEGATIVE ng/mL
Tramadol Scrn, Ur: NEGATIVE ng/mL
Zolpidem, Urine: NEGATIVE ng/mL

## 2015-05-15 NOTE — Progress Notes (Signed)
Urine drug screen for this encounter is consistent for prescribed medication. Has rx for valium reported but las filled 02/05/2015 #30 so takes only prn

## 2015-05-16 ENCOUNTER — Other Ambulatory Visit: Payer: Self-pay | Admitting: Internal Medicine

## 2015-05-16 ENCOUNTER — Ambulatory Visit
Admission: RE | Admit: 2015-05-16 | Discharge: 2015-05-16 | Disposition: A | Discharge status: Home / Self Care | Payer: Medicare Other | Source: Ambulatory Visit | Attending: Internal Medicine | Admitting: Internal Medicine

## 2015-05-16 DIAGNOSIS — R609 Edema, unspecified: Secondary | ICD-10-CM

## 2015-06-11 NOTE — Telephone Encounter (Signed)
Patient had appointment with Dr. Donne Hazel 05/25/15, out of hold per Dr. Quincy Simmonds

## 2015-06-12 ENCOUNTER — Encounter: Payer: Self-pay | Admitting: Registered Nurse

## 2015-06-12 ENCOUNTER — Encounter: Payer: Medicare Other | Attending: Physical Medicine and Rehabilitation | Admitting: Registered Nurse

## 2015-06-12 VITALS — BP 142/74 | HR 70 | Resp 14

## 2015-06-12 DIAGNOSIS — M47812 Spondylosis without myelopathy or radiculopathy, cervical region: Secondary | ICD-10-CM | POA: Insufficient documentation

## 2015-06-12 DIAGNOSIS — M47816 Spondylosis without myelopathy or radiculopathy, lumbar region: Secondary | ICD-10-CM | POA: Insufficient documentation

## 2015-06-12 DIAGNOSIS — M12811 Other specific arthropathies, not elsewhere classified, right shoulder: Secondary | ICD-10-CM | POA: Diagnosis present

## 2015-06-12 DIAGNOSIS — M25512 Pain in left shoulder: Secondary | ICD-10-CM | POA: Diagnosis not present

## 2015-06-12 DIAGNOSIS — M12812 Other specific arthropathies, not elsewhere classified, left shoulder: Secondary | ICD-10-CM | POA: Insufficient documentation

## 2015-06-12 DIAGNOSIS — M4712 Other spondylosis with myelopathy, cervical region: Secondary | ICD-10-CM | POA: Diagnosis not present

## 2015-06-12 DIAGNOSIS — Z5181 Encounter for therapeutic drug level monitoring: Secondary | ICD-10-CM | POA: Insufficient documentation

## 2015-06-12 DIAGNOSIS — Z79899 Other long term (current) drug therapy: Secondary | ICD-10-CM | POA: Insufficient documentation

## 2015-06-12 DIAGNOSIS — Z981 Arthrodesis status: Secondary | ICD-10-CM | POA: Diagnosis not present

## 2015-06-12 DIAGNOSIS — G894 Chronic pain syndrome: Secondary | ICD-10-CM

## 2015-06-12 MED ORDER — MEPERIDINE HCL 50 MG PO TABS: 50.0000 mg | ORAL_TABLET | Freq: Two times a day (BID) | ORAL | Status: DC

## 2015-06-12 NOTE — Progress Notes (Signed)
Subjective:    Patient ID: Jenna [name], female    DOB: 02-26-1933, 81 y.o.   MRN: [id number]  HPI: Jenna [name] is a 81 year old female who returns for follow up for chronic pain and medication refill. She says her pain is located in her left shoulder. She rates her pain 2. Her current exercise regime is walking.   Pain Inventory Average Pain 4 Pain Right Now 2 My pain is intermittent, sharp and aching  In the last 24 hours, has pain interfered with the following? General activity 3 Relation with others 0 Enjoyment of life 3 What TIME of day is your pain at its worst? daytime and evening Sleep (in general) Good  Pain is worse with: standing and some activites Pain improves with: rest, pacing activities, medication and injections Relief from Meds: 5  Mobility walk without assistance how many minutes can you walk? 20-30 ability to climb steps?  yes do you drive?  yes  Function retired  Neuro/Psych weakness  Prior Studies Any changes since last visit?  no  Physicians involved in your care Any changes since last visit?  no   Family History  Problem Relation Age of Onset  . Stroke Mother   . Heart attack Father   . Diabetes Brother   . Heart disease Brother    History   Social History  . Marital Status: Married    Spouse Name: N/A  . Number of Children: N/A  . Years of Education: N/A   Social History Main Topics  . Smoking status: Former Smoker -- 1.00 packs/day for 15 years    Quit date: 11/03/1973  . Smokeless tobacco: Never Used  . Alcohol Use: 3.6 oz/week    6 Standard drinks or equivalent per week     Comment: occ wine w/ supper  . Drug Use: No  . Sexual Activity:    Partners: Male    Birth Control/ Protection: Surgical     Comment: hysterectomy   Other Topics Concern  . None   Social History Narrative   Past Surgical History  Procedure Laterality Date  . Rotator cuff repair Left 1996; 1981  . Cholecystectomy    . Total hip  arthroplasty Left 10/2000  . Total hip arthroplasty Right 06/2005  . Rotator cuff repair Right 1996  . Vaginal delivery      x1  . Rotator cuff  Bilateral 1996/1998    repair  . Lipoma excision Left 10/1999    left thigh  . Total hip arthroplasty Left     10/2000  . Lung biopsy  05/2001    /w Dr.Burney- bronchoscopy, mediastinoscopy  . Lumbosacral cyst  2005    seen on injection for low back pain   . Appendectomy  1986    along with chole  . Breast biopsy Right 1997    sclerosing adenosis -Dr. Lucia Gaskins  . Abdominal hysterectomy  1982    BSO  . Tonsillectomy    . Anterior cervical decomp/discectomy fusion N/A 10/03/2014    Procedure: ANTERIOR CERVICAL DECOMPRESSION/DISCECTOMY FUSION 1 LEVEL,CERVICAL THREE-FOUR;  Surgeon: [name], MD;  Location: [place] [facility];  Service: Neurosurgery;  Laterality: N/A;   Past Medical History  Diagnosis Date  . Lumbosacral spondylosis without myelopathy   . Pain in joint, upper arm   . Other chronic postoperative pain   . Spinal stenosis, lumbar region, without neurogenic claudication   . Osteoarthritis   . Thyroid disease     hypothyroidism  .  Hypercholesteremia   . Cervical spinal stenosis   . Hypertension   . 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 Swallowing dysfunction 2015    especially on thin liquids, study done 01/2014  . Dysrhythmia     (?or extra beats) treated /w atenolol  . Pneumonia   . Neuromuscular disorder     esophageal spasms at times   . Melanoma     on left leg, wide excision   . History of blood transfusion    BP 142/74 mmHg  Pulse 70  Resp 14  SpO2 100%  LMP 11/03/1980  Opioid Risk Score:   Fall Risk Score:  `1  Depression screen PHQ 2/9  Depression screen PHQ 2/9 01/11/2015  Decreased Interest 0  Down, Depressed, Hopeless 0  PHQ - 2 Score 0  Altered sleeping 0  Tired, decreased energy 1  Change in appetite 0  Feeling bad or failure about yourself  0    Trouble concentrating 1  Moving slowly or fidgety/restless 0  Suicidal thoughts 0  PHQ-9 Score 2     Review of Systems  HENT: Negative.   Eyes: Negative.   Respiratory: Negative.   Cardiovascular: Negative.   Gastrointestinal: Negative.   Endocrine: Negative.   Genitourinary: Negative.   Musculoskeletal: Positive for myalgias, back pain and arthralgias.       Left shoulder pain  Skin: Negative.   Allergic/Immunologic: Negative.   Neurological: Positive for weakness.  Hematological: Negative.   Psychiatric/Behavioral: Negative.        Objective:   Physical Exam  Constitutional: She is oriented to person, place, and time. She appears well-developed and well-nourished.  HENT:  Head: Normocephalic and atraumatic.  Neck: Normal range of motion. Neck supple.  Cardiovascular: Normal rate and regular rhythm.   Pulmonary/Chest: Effort normal and breath sounds normal.  Musculoskeletal:  Normal Muscle Bulk and Muscle Testing Reveals: Upper Extremities: Right:  Full ROM and Muscle Strength 5/5 Left: Decreased ROM: 45 Degrees and Muscle Strength 5/5 Back without spinal or paraspinal tenderness Lower Extremities: Full ROM and Muscle strength 5/5 Arises from chair with ease Narrow Based Gait  Neurological: She is alert and oriented to person, place, and time.  Skin: Skin is warm and dry.  Psychiatric: She has a normal mood and affect.  Nursing note and vitals reviewed.         Assessment & Plan:  1. Chronic low back pain with lumbar spondylosis.  Refilled: Meperidine 50 mg one tablet twice a day #60.  2. Cervical stenosis with radiculitis : S/P Anterior Cervical Fusion C3-C4 with Dr. Ellene Route on 10/03/2014 3. Chronic bilateral shoulder pain L>R: Continue with Current medication regime. Surgery Pending with Dr. Veverly Fells [facility] Following.   20 minutes of face to face patient care time was spent during this visit. All questions was encouraged and answered.   F/U in 1 month

## 2015-07-05 ENCOUNTER — Ambulatory Visit: Payer: Medicare Other | Admitting: Nurse Practitioner

## 2015-07-11 ENCOUNTER — Ambulatory Visit (INDEPENDENT_AMBULATORY_CARE_PROVIDER_SITE_OTHER): Payer: Medicare Other | Admitting: Nurse Practitioner

## 2015-07-11 ENCOUNTER — Encounter: Payer: Self-pay | Admitting: Nurse Practitioner

## 2015-07-11 VITALS — BP 136/82 | HR 72 | Ht 62.0 in | Wt 152.0 lb

## 2015-07-11 DIAGNOSIS — Z79890 Hormone replacement therapy (postmenopausal): Secondary | ICD-10-CM | POA: Diagnosis not present

## 2015-07-11 DIAGNOSIS — D1720 Benign lipomatous neoplasm of skin and subcutaneous tissue of unspecified limb: Secondary | ICD-10-CM | POA: Diagnosis not present

## 2015-07-11 DIAGNOSIS — Z01419 Encounter for gynecological examination (general) (routine) without abnormal findings: Secondary | ICD-10-CM

## 2015-07-11 DIAGNOSIS — Z1211 Encounter for screening for malignant neoplasm of colon: Secondary | ICD-10-CM | POA: Diagnosis not present

## 2015-07-11 MED ORDER — ESTROGENS, CONJUGATED 0.625 MG/GM VA CREA: TOPICAL_CREAM | VAGINAL | Status: DC

## 2015-07-11 MED ORDER — ESTRADIOL 0.025 MG/24HR TD PTTW: MEDICATED_PATCH | TRANSDERMAL | Status: DC

## 2015-07-11 NOTE — Patient Instructions (Addendum)

## 2015-07-11 NOTE — Progress Notes (Signed)
Patient ID: Jenna [name], female   DOB: Nov 06, 1932, 81 y.o.   MRN: [id number] 81 y.o. [id number] Married  Caucasian Fe here for annual exam.  Went to general surgeon and area under left axilla is a Lipoma. Will see Dr. Mancel Bale next week about ankle edema. Otherwise she is doing OK with management of her chronic pain.  She is using Vivelle dot at only every 7 days and trying to taper even more.  Only rare use of Premarin vaginal  cream.  Patient's last menstrual period was 11/03/1980 (approximate).          Sexually active: Yes.    The current method of family planning is status post hysterectomy.    Exercising: No.  The patient does not participate in regular exercise at present.  Pt has been doing repairs in house and treadmill has been moved.  Active in house and running errands. Smoker:  no  Health Maintenance: Pap: 2002 per paper chart, no report MMG:  02/19/15, 3D diagnostic with ultrasound; Bi-Rads 1:  Negative, follow up breast check in office 04/2015, return to routine screening in one year Colonoscopy: 06/2003, spoke with GI and due to inability to swallow prep pt no longer getting colonoscopy, but has been advised to continue IFOB testing BMD: 02/2013,  T-Score 2.9 Spine / 0.0 Left Radius TDaP: PCP Shingles: 06/2013 Labs: PCP maintains all labs and urine.    reports that she quit smoking about 41 years ago. She has never used smokeless tobacco. She reports that she drinks about 3.6 oz of alcohol per week. She reports that she does not use illicit drugs.  Past Medical History  Diagnosis Date  . Lumbosacral spondylosis without myelopathy   . Pain in joint, upper arm   . Other chronic postoperative pain   . Spinal stenosis, lumbar region, without neurogenic claudication   . Osteoarthritis   . Thyroid disease     hypothyroidism  . Hypercholesteremia   . Cervical spinal stenosis   . Hypertension   . 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  Swallowing dysfunction 2015    especially on thin liquids, study done 01/2014  . Dysrhythmia     (?or extra beats) treated /w atenolol  . Pneumonia   . Neuromuscular disorder     esophageal spasms at times   . Melanoma     on left leg, wide excision   . History of blood transfusion     Past Surgical History  Procedure Laterality Date  . Rotator cuff repair Left 1996; 1981  . Cholecystectomy    . Total hip arthroplasty Left 10/2000  . Total hip arthroplasty Right 06/2005  . Rotator cuff repair Right 1996  . Vaginal delivery      x1  . Rotator cuff  Bilateral 1996/1998    repair  . Lipoma excision Left 10/1999    left thigh  . Total hip arthroplasty Left     10/2000  . Lung biopsy  05/2001    /w Dr.Burney- bronchoscopy, mediastinoscopy  . Lumbosacral cyst  2005    seen on injection for low back pain   . Appendectomy  1986    along with chole  . Breast biopsy Right 1997    sclerosing adenosis -Dr. Lucia Gaskins  . Abdominal hysterectomy  1982    BSO  . Tonsillectomy    . Anterior cervical decomp/discectomy fusion N/A 10/03/2014    Procedure: ANTERIOR CERVICAL DECOMPRESSION/DISCECTOMY FUSION 1 LEVEL,CERVICAL  THREE-FOUR;  Surgeon: [name], MD;  Location: [facility];  Service: Neurosurgery;  Laterality: N/A;    Current Outpatient Prescriptions  Medication Sig Dispense Refill  . aspirin 81 MG tablet Take 81 mg by mouth daily.     [name] atenolol (TENORMIN) 25 MG tablet Take 25 mg by mouth 2 (two) times daily.     [name] atorvastatin (LIPITOR) 80 MG tablet Take 40 mg by mouth daily.    . calcium carbonate (OS-CAL) 600 MG TABS Take 600 mg by mouth 2 (two) times daily.    . cholecalciferol (VITAMIN D) 1000 UNITS tablet Take 1,000 Units by mouth daily.    . clidinium-chlordiazePOXIDE (LIBRAX) 5-2.5 MG per capsule Take 1 capsule by mouth 3 (three) times daily as needed (for IBS).    [name] conjugated estrogens (PREMARIN) vaginal cream Use 1/2 g vaginally twice weekly 42.5 g 3  . diazepam (VALIUM)  5 MG tablet Take 1 tablet by mouth at bedtime as needed for anxiety.     [name] estradiol (VIVELLE-DOT) 0.025 MG/24HR Use 1 patch twice weekly 24 patch 3  . fexofenadine (ALLEGRA) 180 MG tablet Take 180 mg by mouth daily.    . fluticasone (FLONASE) 50 MCG/ACT nasal spray Place 2 sprays into both nostrils 2 (two) times daily.    . furosemide (LASIX) 20 MG tablet Take 1 tablet by mouth every Monday, Wednesday, and Friday.    . hyoscyamine (LEVSIN SL) 0.125 MG SL tablet Place 0.125-0.25 mg under the tongue every 4 (four) hours as needed for cramping.     [name] ibuprofen (ADVIL,MOTRIN) 200 MG tablet Take 400 mg by mouth every 6 (six) hours as needed (for pain).     [name] levothyroxine (SYNTHROID, LEVOTHROID) 112 MCG tablet Take 112 mcg by mouth daily before breakfast.    . losartan (COZAAR) 50 MG tablet Take 50 mg by mouth daily.    . meperidine (DEMEROL) 50 MG tablet Take 1 tablet (50 mg total) by mouth 2 (two) times daily. 60 tablet 0  . methocarbamol (ROBAXIN) 500 MG tablet Take 1 tablet (500 mg total) by mouth every 6 (six) hours as needed for muscle spasms. 60 tablet 0  . Multiple Vitamin (MULTIVITAMIN) tablet Take 1 tablet by mouth daily.    [name] omeprazole (PRILOSEC) 40 MG capsule Take 40 mg by mouth 2 (two) times daily.     . promethazine (PHENERGAN) 25 MG tablet Take one tablet twice daily AS NEEDED (Patient taking differently: Take 25 mg by mouth every 6 (six) hours as needed for nausea. For nausea and vomiting) 60 tablet 2   No current facility-administered medications for this visit.    Family History  Problem Relation Age of Onset  . Stroke Mother   . Heart attack Father   . Diabetes Brother   . Heart disease Brother     ROS:  Pertinent items are noted in HPI.  Otherwise, a comprehensive ROS was negative.  Exam:   BP 136/82 mmHg  Pulse 72  Ht 5\' 2"  (1.575 m)  Wt 152 lb (68.947 kg)  BMI 27.79 kg/m2  LMP 11/03/1980 (Approximate) Height: 5\' 2"  (157.5 cm) Ht Readings from Last 3 Encounters:   07/11/15 5\' 2"  (1.575 m)  04/25/15 5\' 2"  (1.575 m)  02/08/15 5\' 2"  (1.575 m)    General appearance: alert, cooperative and appears stated age Head: Normocephalic, without obvious abnormality, atraumatic Neck: no adenopathy, supple, symmetrical, trachea midline and thyroid normal to inspection and palpation Lungs: clear to auscultation bilaterally Breasts: normal  appearance, no masses or tenderness, Lipoma left axilla that is stable and unchanged. Heart: regular rate and rhythm Abdomen: soft, non-tender; no masses,  no organomegaly Extremities: extremities normal, atraumatic, no cyanosis or edema Skin: Skin color, texture, turgor normal. No rashes or lesions Lymph nodes: Cervical, supraclavicular, and axillary nodes normal. No abnormal inguinal nodes palpated Neurologic: Grossly normal   Pelvic: External genitalia:  no lesions              Urethra:  normal appearing urethra with no masses, tenderness or lesions              Bartholin's and Skene's: normal                 Vagina: normal appearing vagina with normal color and discharge, no lesions              Cervix: absent              Pap taken: No. Bimanual Exam:  Uterus:  uterus absent              Adnexa: no mass, fullness, tenderness               Rectovaginal: Confirms               Anus:  normal sphincter tone, no lesions  Chaperone present: no  A:  Well Woman with normal exam  S/P TAH/ BSO secondary to fibroids on ERT 1982 trying to taper dose Chronic neck pain - DDD seeing pain specialist  IBS, hypercholesterolemia, HTN, hypothyroid  Lipoma left axilla   P:   Reviewed health and wellness pertinent to exam  Pap smear as above  Mammogram is due 02/2016  Refill on Vivelle patch and continue to taper  Refill on Premarin cream and will try to get at another pharmacy to compare cost she is given a paper copy.  Counseled with risk of DVT, CVA, cancer  IFOB is given  Counseled on breast self  exam, mammography screening, use and side effects of HRT, adequate intake of calcium and vitamin D, diet and exercise return annually or prn  An After Visit Summary was printed and given to the patient.

## 2015-07-12 NOTE — Progress Notes (Signed)
Encounter reviewed by Dr. Cory Rama Amundson C. Silva.  

## 2015-07-18 ENCOUNTER — Other Ambulatory Visit (HOSPITAL_COMMUNITY): Payer: Self-pay | Admitting: Internal Medicine

## 2015-07-18 DIAGNOSIS — R601 Generalized edema: Secondary | ICD-10-CM

## 2015-07-20 ENCOUNTER — Ambulatory Visit (HOSPITAL_COMMUNITY): Payer: Medicare Other

## 2015-07-20 ENCOUNTER — Other Ambulatory Visit (HOSPITAL_COMMUNITY): Payer: Medicare Other

## 2015-07-23 ENCOUNTER — Telehealth: Payer: Self-pay | Admitting: Nurse Practitioner

## 2015-07-23 NOTE — Telephone Encounter (Signed)
[facility] denied patient's prescription for the estradiol patch. Patient would like to speak with nurse.

## 2015-07-24 ENCOUNTER — Telehealth: Payer: Self-pay | Admitting: *Deleted

## 2015-07-24 ENCOUNTER — Ambulatory Visit: Payer: Medicare Other | Admitting: Registered Nurse

## 2015-07-24 MED ORDER — MEPERIDINE HCL 50 MG PO TABS: 50.0000 mg | ORAL_TABLET | Freq: Two times a day (BID) | ORAL | Status: DC

## 2015-07-24 NOTE — Telephone Encounter (Signed)
Tyleigh is sick and had to cancel her appointment with Zella Ball.  The first appt available is next week and she will be out of meds this weekend.  Can she pick up an prescription and then come to next week appt?  Rx printed for Zella Ball to sign.I have notified Jenille it can be picked up tomorrow afternoon.

## 2015-07-24 NOTE — Telephone Encounter (Signed)
Call to patient.  She cannot talk at this time.  Appeal signed by Kem Boroughs, FNP and record of last visit sent with appeal request to [facility] at this time.

## 2015-07-24 NOTE — Telephone Encounter (Signed)
Patient returned call. She is advised an appeal was sent. Will call with response of appeal.

## 2015-07-25 ENCOUNTER — Encounter: Payer: Medicare Other | Admitting: Registered Nurse

## 2015-07-30 ENCOUNTER — Ambulatory Visit (HOSPITAL_COMMUNITY): Payer: Medicare Other

## 2015-08-02 NOTE — Telephone Encounter (Signed)
Call to patient and advised appeal has been approved and patient advised to call back with any concerns. Routing to provider for final review. Patient agreeable to disposition. Will close encounter.

## 2015-08-03 ENCOUNTER — Encounter: Payer: Self-pay | Admitting: Registered Nurse

## 2015-08-03 ENCOUNTER — Encounter: Payer: Medicare Other | Attending: Physical Medicine and Rehabilitation | Admitting: Registered Nurse

## 2015-08-03 VITALS — BP 146/73 | HR 72

## 2015-08-03 DIAGNOSIS — M47816 Spondylosis without myelopathy or radiculopathy, lumbar region: Secondary | ICD-10-CM

## 2015-08-03 DIAGNOSIS — Z5181 Encounter for therapeutic drug level monitoring: Secondary | ICD-10-CM | POA: Diagnosis not present

## 2015-08-03 DIAGNOSIS — M25512 Pain in left shoulder: Secondary | ICD-10-CM | POA: Diagnosis not present

## 2015-08-03 DIAGNOSIS — Z79899 Other long term (current) drug therapy: Secondary | ICD-10-CM | POA: Diagnosis present

## 2015-08-03 DIAGNOSIS — M12811 Other specific arthropathies, not elsewhere classified, right shoulder: Secondary | ICD-10-CM | POA: Insufficient documentation

## 2015-08-03 DIAGNOSIS — G894 Chronic pain syndrome: Secondary | ICD-10-CM | POA: Diagnosis not present

## 2015-08-03 DIAGNOSIS — M12812 Other specific arthropathies, not elsewhere classified, left shoulder: Secondary | ICD-10-CM | POA: Insufficient documentation

## 2015-08-03 DIAGNOSIS — M47812 Spondylosis without myelopathy or radiculopathy, cervical region: Secondary | ICD-10-CM | POA: Diagnosis present

## 2015-08-03 MED ORDER — HYDROCHLOROTHIAZIDE 12.5 MG PO CAPS: 12.5000 mg | ORAL_CAPSULE | Freq: Every day | ORAL | Status: DC

## 2015-08-03 MED ORDER — MEPERIDINE HCL 50 MG PO TABS: 50.0000 mg | ORAL_TABLET | Freq: Two times a day (BID) | ORAL | Status: DC

## 2015-08-03 MED ORDER — LOSARTAN POTASSIUM 100 MG PO TABS: 100.0000 mg | ORAL_TABLET | Freq: Every day | ORAL | Status: DC

## 2015-08-03 NOTE — Progress Notes (Signed)
Subjective:    Patient ID: Jenna [name], female    DOB: 1933-01-11, 81 y.o.   MRN: [id number]  HPI: Jenna [name] is a 81 year old female who returns for follow up for chronic pain and medication refill. She says her pain is located in her left shoulder and lower back. She rates her pain 2. Her current exercise regime is walking.   Pain Inventory Average Pain 3 Pain Right Now 2 My pain is intermittent and aching  In the last 24 hours, has pain interfered with the following? General activity 3 Relation with others 0 Enjoyment of life 2 What TIME of day is your pain at its worst? daytime, evening Sleep (in general) Good  Pain is worse with: standing and some activites Pain improves with: rest, pacing activities, medication and injections Relief from Meds: 7  Mobility walk without assistance how many minutes can you walk? 15 ability to climb steps?  yes do you drive?  yes Do you have any goals in this area?  yes  Function retired Do you have any goals in this area?  no  Neuro/Psych No problems in this area  Prior Studies Any changes since last visit?  no  Physicians involved in your care Any changes since last visit?  no   Family History  Problem Relation Age of Onset  . Stroke Mother   . Heart attack Father   . Diabetes Brother   . Heart disease Brother    Social History   Social History  . Marital Status: Married    Spouse Name: N/A  . Number of Children: N/A  . Years of Education: N/A   Social History Main Topics  . Smoking status: Former Smoker -- 1.00 packs/day for 15 years    Quit date: 11/03/1973  . Smokeless tobacco: Never Used  . Alcohol Use: 3.6 oz/week    6 Standard drinks or equivalent per week     Comment: occ wine w/ supper  . Drug Use: No  . Sexual Activity:    Partners: Male    Birth Control/ Protection: Surgical     Comment: hysterectomy   Other Topics Concern  . None   Social History Narrative   Past Surgical  History  Procedure Laterality Date  . Rotator cuff repair Left 1996; 1981  . Cholecystectomy    . Total hip arthroplasty Left 10/2000  . Total hip arthroplasty Right 06/2005  . Rotator cuff repair Right 1996  . Vaginal delivery      x1  . Rotator cuff  Bilateral 1996/1998    repair  . Lipoma excision Left 10/1999    left thigh  . Total hip arthroplasty Left     10/2000  . Lung biopsy  05/2001    /w Dr.Burney- bronchoscopy, mediastinoscopy  . Lumbosacral cyst  2005    seen on injection for low back pain   . Appendectomy  1986    along with chole  . Breast biopsy Right 1997    sclerosing adenosis -Dr. Lucia Gaskins  . Abdominal hysterectomy  1982    BSO  . Tonsillectomy    . Anterior cervical decomp/discectomy fusion N/A 10/03/2014    Procedure: ANTERIOR CERVICAL DECOMPRESSION/DISCECTOMY FUSION 1 LEVEL,CERVICAL THREE-FOUR;  Surgeon: [name], MD;  Location: [facility];  Service: Neurosurgery;  Laterality: N/A;   Past Medical History  Diagnosis Date  . Lumbosacral spondylosis without myelopathy   . Pain in joint, upper arm   . Other chronic postoperative  pain   . Spinal stenosis, lumbar region, without neurogenic claudication   . Osteoarthritis   . Thyroid disease     hypothyroidism  . Hypercholesteremia   . Cervical spinal stenosis   . Hypertension   . 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 Swallowing dysfunction 2015    especially on thin liquids, study done 01/2014  . Dysrhythmia     (?or extra beats) treated /w atenolol  . Pneumonia   . Neuromuscular disorder     esophageal spasms at times   . Melanoma     on left leg, wide excision   . History of blood transfusion    BP 146/73 mmHg  Pulse 72  SpO2 97%  LMP 11/03/1980 (Approximate)  Opioid Risk Score:   Fall Risk Score:  `1  Depression screen PHQ 2/9  Depression screen PHQ 2/9 01/11/2015  Decreased Interest 0  Down, Depressed, Hopeless 0  PHQ - 2 Score 0    Altered sleeping 0  Tired, decreased energy 1  Change in appetite 0  Feeling bad or failure about yourself  0  Trouble concentrating 1  Moving slowly or fidgety/restless 0  Suicidal thoughts 0  PHQ-9 Score 2     Review of Systems  All other systems reviewed and are negative.      Objective:   Physical Exam  Constitutional: She is oriented to person, place, and time. She appears well-developed and well-nourished.  HENT:  Head: Normocephalic and atraumatic.  Neck: Normal range of motion. Neck supple.  Cardiovascular: Normal rate and regular rhythm.   Pulmonary/Chest: Effort normal and breath sounds normal.  Musculoskeletal:  Normal Muscle Bulk and Muscle Testing Reveals: Upper Extremities: Right:Full ROM and Muscle Strength 5/5 Left: Decreased ROM 90 Degrees and Muscle Strength 5/5 Back without spinal or paraspinal tenderness Lower Extremities: Full ROM and Muscle Strength 5/5 Arises from chair with ease Narrow Based gait  Neurological: She is alert and oriented to person, place, and time.  Skin: Skin is warm and dry.  Psychiatric: She has a normal mood and affect.  Nursing note and vitals reviewed.         Assessment & Plan:  1. Chronic low back pain with lumbar spondylosis.  Refilled: Meperidine 50 mg one tablet twice a day #60.  2. Cervical stenosis with radiculitis : S/P Anterior Cervical Fusion C3-C4 with Dr. Ellene Route on 10/03/2014 3. Chronic bilateral shoulder pain L>R: Continue with Current medication regime. Surgery Pending with Dr. [facility]ever[facility] Following.   20 minutes of face to face patient care time was spent during this visit. All questions was encouraged and answered.   F/U in 1 month

## 2015-08-07 ENCOUNTER — Ambulatory Visit (HOSPITAL_COMMUNITY)
Admission: RE | Admit: 2015-08-07 | Discharge: 2015-08-07 | Disposition: A | Discharge status: Home / Self Care | Payer: Medicare Other | Source: Ambulatory Visit | Attending: Internal Medicine | Admitting: Internal Medicine

## 2015-08-07 DIAGNOSIS — I358 Other nonrheumatic aortic valve disorders: Secondary | ICD-10-CM | POA: Diagnosis not present

## 2015-08-07 DIAGNOSIS — I059 Rheumatic mitral valve disease, unspecified: Secondary | ICD-10-CM | POA: Insufficient documentation

## 2015-08-07 DIAGNOSIS — R601 Generalized edema: Secondary | ICD-10-CM

## 2015-08-07 DIAGNOSIS — R609 Edema, unspecified: Secondary | ICD-10-CM | POA: Diagnosis present

## 2015-08-07 NOTE — Progress Notes (Signed)
  Echocardiogram 2D Echocardiogram has been performed.  Jenna [name] 08/07/2015, 3:02 PM

## 2015-08-09 LAB — FECAL OCCULT BLOOD, IMMUNOCHEMICAL: IMMUNOLOGICAL FECAL OCCULT BLOOD TEST: NEGATIVE

## 2015-08-09 NOTE — Addendum Note (Signed)
Addended by: Susanne Greenhouse E on: 08/09/2015 11:03 AM   Modules accepted: Orders

## 2015-09-04 ENCOUNTER — Encounter: Payer: Medicare Other | Attending: Physical Medicine and Rehabilitation | Admitting: Registered Nurse

## 2015-09-04 ENCOUNTER — Encounter: Payer: Self-pay | Admitting: Registered Nurse

## 2015-09-04 VITALS — BP 135/71 | HR 80

## 2015-09-04 DIAGNOSIS — M12811 Other specific arthropathies, not elsewhere classified, right shoulder: Secondary | ICD-10-CM | POA: Diagnosis present

## 2015-09-04 DIAGNOSIS — M25512 Pain in left shoulder: Secondary | ICD-10-CM

## 2015-09-04 DIAGNOSIS — Z79899 Other long term (current) drug therapy: Secondary | ICD-10-CM | POA: Insufficient documentation

## 2015-09-04 DIAGNOSIS — M12812 Other specific arthropathies, not elsewhere classified, left shoulder: Secondary | ICD-10-CM | POA: Insufficient documentation

## 2015-09-04 DIAGNOSIS — G894 Chronic pain syndrome: Secondary | ICD-10-CM

## 2015-09-04 DIAGNOSIS — M47816 Spondylosis without myelopathy or radiculopathy, lumbar region: Secondary | ICD-10-CM | POA: Diagnosis not present

## 2015-09-04 DIAGNOSIS — M47812 Spondylosis without myelopathy or radiculopathy, cervical region: Secondary | ICD-10-CM | POA: Diagnosis present

## 2015-09-04 DIAGNOSIS — Z5181 Encounter for therapeutic drug level monitoring: Secondary | ICD-10-CM | POA: Insufficient documentation

## 2015-09-04 MED ORDER — MEPERIDINE HCL 50 MG PO TABS: 50.0000 mg | ORAL_TABLET | Freq: Two times a day (BID) | ORAL | Status: DC

## 2015-09-05 ENCOUNTER — Other Ambulatory Visit: Payer: Self-pay | Admitting: Physical Medicine & Rehabilitation

## 2015-09-05 NOTE — Progress Notes (Signed)
Subjective:    Patient ID: Jenna [name], female    DOB: 09-May-1933, 81 y.o.   MRN: [id number]  HPI: [name] is a 81 year old female who returns for follow up for chronic pain and medication refill. She says her pain is located in her left shoulder and lower back radiating posteriorly into lower extremities. She rates her pain 2. Her current exercise regime is walking.   Pain Inventory Average Pain 3 Pain Right Now 2 My pain is intermittent, sharp and aching  In the last 24 hours, has pain interfered with the following? General activity 4 Relation with others 0 Enjoyment of life 3 What TIME of day is your pain at its worst? Daytime and Evening Sleep (in general) Fair  Pain is worse with: walking, standing and some activites Pain improves with: rest, pacing activities, medication and injections Relief from Meds: 6  Mobility walk without assistance how many minutes can you walk? 15-20 ability to climb steps?  yes do you drive?  yes Do you have any goals in this area?  yes  Function Do you have any goals in this area?  no  Neuro/Psych No problems in this area  Prior Studies Any changes since last visit?  yes  Physicians involved in your care Any changes since last visit?  no   Family History  Problem Relation Age of Onset  . Stroke Mother   . Heart attack Father   . Diabetes Brother   . Heart disease Brother    Social History   Social History  . Marital Status: Married    Spouse Name: N/A  . Number of Children: N/A  . Years of Education: N/A   Social History Main Topics  . Smoking status: Former Smoker -- 1.00 packs/day for 15 years    Quit date: 11/03/1973  . Smokeless tobacco: Never Used  . Alcohol Use: 3.6 oz/week    6 Standard drinks or equivalent per week     Comment: occ wine w/ supper  . Drug Use: No  . Sexual Activity:    Partners: Male    Birth Control/ Protection: Surgical     Comment: hysterectomy   Other Topics Concern    . None   Social History Narrative   Past Surgical History  Procedure Laterality Date  . Rotator cuff repair Left 1996; 1981  . Cholecystectomy    . Total hip arthroplasty Left 10/2000  . Total hip arthroplasty Right 06/2005  . Rotator cuff repair Right 1996  . Vaginal delivery      x1  . Rotator cuff  Bilateral 1996/1998    repair  . Lipoma excision Left 10/1999    left thigh  . Total hip arthroplasty Left     10/2000  . Lung biopsy  05/2001    /w Dr.Burney- bronchoscopy, mediastinoscopy  . Lumbosacral cyst  2005    seen on injection for low back pain   . Appendectomy  1986    along with chole  . Breast biopsy Right 1997    sclerosing adenosis -Dr. Lucia Gaskins  . Abdominal hysterectomy  1982    BSO  . Tonsillectomy    . Anterior cervical decomp/discectomy fusion N/A 10/03/2014    Procedure: ANTERIOR CERVICAL DECOMPRESSION/DISCECTOMY FUSION 1 LEVEL,CERVICAL THREE-FOUR;  Surgeon: [name], MD;  Location: [facility];  Service: Neurosurgery;  Laterality: N/A;   Past Medical History  Diagnosis Date  . Lumbosacral spondylosis without myelopathy   . Pain in joint,  upper arm   . Other chronic postoperative pain   . Spinal stenosis, lumbar region, without neurogenic claudication   . Osteoarthritis   . Thyroid disease     hypothyroidism  . Hypercholesteremia   . Cervical spinal stenosis   . Hypertension   . Sarcoidosis of lung ([place]) 05/2001    developed into pneumonia, resolved within a year  . Swallowing dysfunction 2015    especially on thin liquids, study done 01/2014  . Dysrhythmia     (?or extra beats) treated /w atenolol  . Pneumonia   . Neuromuscular disorder ([place])     esophageal spasms at times   . Melanoma ([place])     on left leg, wide excision   . History of blood transfusion    BP 135/71 mmHg  Pulse 80  SpO2 96%  LMP 11/03/1980 (Approximate)  Opioid Risk Score:   Fall Risk Score:  `1  Depression screen PHQ 2/9  Depression screen PHQ 2/9 01/11/2015   Decreased Interest 0  Down, Depressed, Hopeless 0  PHQ - 2 Score 0  Altered sleeping 0  Tired, decreased energy 1  Change in appetite 0  Feeling bad or failure about yourself  0  Trouble concentrating 1  Moving slowly or fidgety/restless 0  Suicidal thoughts 0  PHQ-9 Score 2     Review of Systems  All other systems reviewed and are negative.      Objective:   Physical Exam  Constitutional: She is oriented to person, place, and time. She appears well-developed and well-nourished.  HENT:  Head: Normocephalic and atraumatic.  Neck: Normal range of motion. Neck supple.  Cardiovascular: Normal rate and regular rhythm.   Pulmonary/Chest: Effort normal and breath sounds normal.  Musculoskeletal:  Normal Muscle Bulk and Muscle Testing Reveals: Upper Extremities: Right Full ROM and Muscle Strength 5/5 Left: Decreased ROM 90 Degrees and Muscle Strength 4/5 Back without spinal or paraspinal tenderness Lower Extremities: Full ROM and Muscle Strength 5/5 Arises from chair with ease Narrow Based gait  Neurological: She is alert and oriented to person, place, and time.  Skin: Skin is warm and dry.  Psychiatric: She has a normal mood and affect.  Nursing note and vitals reviewed.         Assessment & Plan:  1. Chronic low back pain with lumbar spondylosis.  Refilled: Meperidine 50 mg one tablet twice a day #60.  2. Cervical stenosis with radiculitis : S/P Anterior Cervical Fusion C3-C4 with Dr. Ellene Route on 10/03/2014 3. Chronic bilateral shoulder pain L>R: Continue with Current medication regime. Surgery Pending with Dr. [facility]ever[facility] Following.   20 minutes of face to face patient care time was spent during this visit. All questions was encouraged and answered.   F/U in 1 month

## 2015-09-06 LAB — PMP ALCOHOL METABOLITE (ETG): ETGU: NEGATIVE ng/mL

## 2015-09-09 LAB — BENZODIAZEPINES (GC/LC/MS), URINE
ALPRAZOLAMU: NEGATIVE ng/mL (ref <25)
Clonazepam metabolite (GC/LC/MS), ur confirm: NEGATIVE ng/mL (ref <25)
Flurazepam metabolite (GC/LC/MS), ur confirm: NEGATIVE ng/mL (ref <50)
Lorazepam (GC/LC/MS), ur confirm: NEGATIVE ng/mL (ref <50)
MIDAZOLAMU: NEGATIVE ng/mL (ref <50)
Nordiazepam (GC/LC/MS), ur confirm: NEGATIVE ng/mL (ref <50)
OXAZEPAMU: NEGATIVE ng/mL (ref <50)
TEMAZEPAMU: NEGATIVE ng/mL (ref <50)
Triazolam metabolite (GC/LC/MS), ur confirm: NEGATIVE ng/mL (ref <50)

## 2015-09-09 LAB — MEPERIDINE (GC/LC/MS), URINE
MEPERIDINE UR CONFIRM: 462 ng/mL (ref <100)
Normeperidine (GC/LC/MS), ur confirm: 3375 ng/mL — AB (ref <100)

## 2015-09-11 LAB — PRESCRIPTION MONITORING PROFILE (SOLSTAS)
Amphetamine/Meth: NEGATIVE ng/mL
BUPRENORPHINE, URINE: NEGATIVE ng/mL
Barbiturate Screen, Urine: NEGATIVE ng/mL
CANNABINOID SCRN UR: NEGATIVE ng/mL
COCAINE METABOLITES: NEGATIVE ng/mL
CREATININE, URINE: 41.15 mg/dL (ref >20.0)
Carisoprodol, Urine: NEGATIVE ng/mL
FENTANYL URINE: NEGATIVE ng/mL
MDMA URINE: NEGATIVE ng/mL
Methadone Screen, Urine: NEGATIVE ng/mL
Nitrites, Initial: NEGATIVE ug/mL
OPIATE SCREEN, URINE: NEGATIVE ng/mL
OXYCODONE SCRN UR: NEGATIVE ng/mL
PH URINE, INITIAL: 7 pH (ref 4.5–8.9)
Propoxyphene: NEGATIVE ng/mL
Tapentadol, urine: NEGATIVE ng/mL
Tramadol Scrn, Ur: NEGATIVE ng/mL
ZOLPIDEM, URINE: NEGATIVE ng/mL

## 2015-09-18 NOTE — Progress Notes (Signed)
Urine drug screen for this encounter is consistent for prescribed medication 

## 2015-09-25 ENCOUNTER — Other Ambulatory Visit: Payer: Self-pay | Admitting: Registered Nurse

## 2015-10-16 ENCOUNTER — Ambulatory Visit: Payer: Medicare Other | Admitting: Physical Medicine & Rehabilitation

## 2015-10-16 ENCOUNTER — Encounter: Payer: Self-pay | Admitting: Physical Medicine & Rehabilitation

## 2015-10-16 ENCOUNTER — Encounter: Payer: Medicare Other | Attending: Physical Medicine and Rehabilitation

## 2015-10-16 ENCOUNTER — Ambulatory Visit (HOSPITAL_BASED_OUTPATIENT_CLINIC_OR_DEPARTMENT_OTHER): Payer: Medicare Other | Admitting: Physical Medicine & Rehabilitation

## 2015-10-16 VITALS — BP 179/81 | HR 71 | Resp 14

## 2015-10-16 DIAGNOSIS — M12811 Other specific arthropathies, not elsewhere classified, right shoulder: Secondary | ICD-10-CM | POA: Diagnosis present

## 2015-10-16 DIAGNOSIS — M47816 Spondylosis without myelopathy or radiculopathy, lumbar region: Secondary | ICD-10-CM | POA: Insufficient documentation

## 2015-10-16 DIAGNOSIS — G8928 Other chronic postprocedural pain: Secondary | ICD-10-CM

## 2015-10-16 DIAGNOSIS — M47812 Spondylosis without myelopathy or radiculopathy, cervical region: Secondary | ICD-10-CM | POA: Insufficient documentation

## 2015-10-16 DIAGNOSIS — M12812 Other specific arthropathies, not elsewhere classified, left shoulder: Secondary | ICD-10-CM | POA: Insufficient documentation

## 2015-10-16 DIAGNOSIS — Z5181 Encounter for therapeutic drug level monitoring: Secondary | ICD-10-CM | POA: Diagnosis present

## 2015-10-16 DIAGNOSIS — Z79899 Other long term (current) drug therapy: Secondary | ICD-10-CM | POA: Diagnosis present

## 2015-10-16 DIAGNOSIS — M75102 Unspecified rotator cuff tear or rupture of left shoulder, not specified as traumatic: Secondary | ICD-10-CM

## 2015-10-16 DIAGNOSIS — M4712 Other spondylosis with myelopathy, cervical region: Secondary | ICD-10-CM

## 2015-10-16 DIAGNOSIS — M75101 Unspecified rotator cuff tear or rupture of right shoulder, not specified as traumatic: Secondary | ICD-10-CM

## 2015-10-16 MED ORDER — MEPERIDINE HCL 50 MG PO TABS: 50.0000 mg | ORAL_TABLET | Freq: Two times a day (BID) | ORAL | Status: DC

## 2015-10-16 NOTE — Progress Notes (Signed)
Subjective:    Patient ID: Jenna [name], female    DOB: 29-Nov-1932, 81 y.o.   MRN: [id number]  HPI   CC LEFT shoulder pain  Seeing Ortho for this , planning TSA  Low back is doing ok  Pain Inventory Average Pain 7 Pain Right Now NA My pain is intermittent, constant and sharp  In the last 24 hours, has pain interfered with the following? General activity 5 Relation with others 2 Enjoyment of life 7 What TIME of day is your pain at its worst? daytime, evening, night Sleep (in general) Fair  Pain is worse with: some activites Pain improves with: rest and medication Relief from Meds: 5  Mobility walk without assistance how many minutes can you walk? 20-30 ability to climb steps?  yes do you drive?  yes Do you have any goals in this area?  yes  Function retired  Neuro/Psych No problems in this area  Prior Studies Any changes since last visit?  no  Physicians involved in your care Any changes since last visit?  no   Family History  Problem Relation Age of Onset  . Stroke Mother   . Heart attack Father   . Diabetes Brother   . Heart disease Brother    Social History   Social History  . Marital Status: Married    Spouse Name: N/A  . Number of Children: N/A  . Years of Education: N/A   Social History Main Topics  . Smoking status: Former Smoker -- 1.00 packs/day for 15 years    Quit date: 11/03/1973  . Smokeless tobacco: Never Used  . Alcohol Use: 3.6 oz/week    6 Standard drinks or equivalent per week     Comment: occ wine w/ supper  . Drug Use: No  . Sexual Activity:    Partners: Male    Birth Control/ Protection: Surgical     Comment: hysterectomy   Other Topics Concern  . None   Social History Narrative   Past Surgical History  Procedure Laterality Date  . Rotator cuff repair Left 1996; 1981  . Cholecystectomy    . Total hip arthroplasty Left 10/2000  . Total hip arthroplasty Right 06/2005  . Rotator cuff repair Right 1996  .  Vaginal delivery      x1  . Rotator cuff  Bilateral 1996/1998    repair  . Lipoma excision Left 10/1999    left thigh  . Total hip arthroplasty Left     10/2000  . Lung biopsy  05/2001    /w Dr.Burney- bronchoscopy, mediastinoscopy  . Lumbosacral cyst  2005    seen on injection for low back pain   . Appendectomy  1986    along with chole  . Breast biopsy Right 1997    sclerosing adenosis -Dr. Lucia Gaskins  . Abdominal hysterectomy  1982    BSO  . Tonsillectomy    . Anterior cervical decomp/discectomy fusion N/A 10/03/2014    Procedure: ANTERIOR CERVICAL DECOMPRESSION/DISCECTOMY FUSION 1 LEVEL,CERVICAL THREE-FOUR;  Surgeon: [name], MD;  Location: [place] [facility];  Service: Neurosurgery;  Laterality: N/A;   Past Medical History  Diagnosis Date  . Lumbosacral spondylosis without myelopathy   . Pain in joint, upper arm   . Other chronic postoperative pain   . Spinal stenosis, lumbar region, without neurogenic claudication   . Osteoarthritis   . Thyroid disease     hypothyroidism  . Hypercholesteremia   . Cervical spinal stenosis   . Hypertension   .  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 Swallowing dysfunction 2015    especially on thin liquids, study done 01/2014  . Dysrhythmia     (?or extra beats) treated /w atenolol  . Pneumonia   . Neuromuscular disorder ([place])     esophageal spasms at times   . Melanoma ([place])     on left leg, wide excision   . History of blood transfusion    BP 179/81 mmHg  Pulse 71  Resp 14  SpO2 100%  LMP 11/03/1980 (Approximate)  Opioid Risk Score:   Fall Risk Score:  `1  Depression screen PHQ 2/9  Depression screen PHQ 2/9 01/11/2015  Decreased Interest 0  Down, Depressed, Hopeless 0  PHQ - 2 Score 0  Altered sleeping 0  Tired, decreased energy 1  Change in appetite 0  Feeling bad or failure about yourself  0  Trouble concentrating 1  Moving slowly or fidgety/restless 0  Suicidal  thoughts 0  PHQ-9 Score 2      Review of Systems  All other systems reviewed and are negative.      Objective:   Physical Exam  Constitutional: She is oriented to person, place, and time. She appears well-developed and well-nourished.  HENT:  Head: Atraumatic.  Eyes: Conjunctivae and EOM are normal. Pupils are equal, round, and reactive to light.  Neck: Normal range of motion.  Musculoskeletal:       Left shoulder: She exhibits decreased range of motion and crepitus.  Pain with attempted abduction and forward flexion of the left shoulder  Neurological: She is alert and oriented to person, place, and time.  Psychiatric: She has a normal mood and affect.  Nursing note and vitals reviewed.   Left shoulder abd and flexion to 75degrees Lumbar flexion is to 75% Lumbar extension 75% Lateral bending to 50% bilaterally No tenderness to palpation lumbar paraspinals No tenderness palpation over the greater trochanters of the hips.      Assessment & Plan:  1. Lumbar spondylosis doing well from a low back pain standpoint. No need for repeat MBB Bilateral L3,4,5 2. Cervical stenosis status post C3-C4 ACDF- doing well,  Released by Dr Ellene Route  3. Shoulder pain with restricted range of motion, glenohumeral arthritis.- planning to get a TSA next year  CHronic pain syndrome multifactorial  Demerol 50mg  BID Continue opioid monitoring program. This consists of regular clinic visits, examinations, urine drug screen, pill counts as well as use of [place] controlled substance reporting System. UDS performed November 2016 was consistent. Positive benzo due to chlordiazepoxide  If patient undergoes total shoulder arthroplasty and left side I would advise increasing her Demerol to 50 mg every 4-6 hours postoperatively for the first month and then tapering off to her usual dose of 50 mg twice a day over the next 1-2 months

## 2015-10-16 NOTE — Patient Instructions (Signed)
May be prescribed additional Demerol by her surgeon after total shoulder arthroplasty

## 2015-11-06 ENCOUNTER — Other Ambulatory Visit: Payer: Self-pay | Admitting: Physician Assistant

## 2015-11-06 DIAGNOSIS — M19012 Primary osteoarthritis, left shoulder: Secondary | ICD-10-CM

## 2015-11-13 ENCOUNTER — Encounter: Payer: Self-pay | Admitting: Registered Nurse

## 2015-11-13 ENCOUNTER — Encounter: Payer: Medicare Other | Attending: Physical Medicine and Rehabilitation | Admitting: Registered Nurse

## 2015-11-13 VITALS — BP 154/78 | HR 73 | Resp 16

## 2015-11-13 DIAGNOSIS — G894 Chronic pain syndrome: Secondary | ICD-10-CM

## 2015-11-13 DIAGNOSIS — M47812 Spondylosis without myelopathy or radiculopathy, cervical region: Secondary | ICD-10-CM | POA: Diagnosis present

## 2015-11-13 DIAGNOSIS — M47816 Spondylosis without myelopathy or radiculopathy, lumbar region: Secondary | ICD-10-CM

## 2015-11-13 DIAGNOSIS — M4712 Other spondylosis with myelopathy, cervical region: Secondary | ICD-10-CM | POA: Diagnosis not present

## 2015-11-13 DIAGNOSIS — M12811 Other specific arthropathies, not elsewhere classified, right shoulder: Secondary | ICD-10-CM | POA: Diagnosis not present

## 2015-11-13 DIAGNOSIS — Z79899 Other long term (current) drug therapy: Secondary | ICD-10-CM | POA: Insufficient documentation

## 2015-11-13 DIAGNOSIS — M75101 Unspecified rotator cuff tear or rupture of right shoulder, not specified as traumatic: Secondary | ICD-10-CM

## 2015-11-13 DIAGNOSIS — Z5181 Encounter for therapeutic drug level monitoring: Secondary | ICD-10-CM | POA: Diagnosis present

## 2015-11-13 DIAGNOSIS — M12812 Other specific arthropathies, not elsewhere classified, left shoulder: Secondary | ICD-10-CM | POA: Diagnosis present

## 2015-11-13 DIAGNOSIS — M75102 Unspecified rotator cuff tear or rupture of left shoulder, not specified as traumatic: Secondary | ICD-10-CM

## 2015-11-13 MED ORDER — MEPERIDINE HCL 50 MG PO TABS: 50.0000 mg | ORAL_TABLET | Freq: Two times a day (BID) | ORAL | Status: DC

## 2015-11-13 NOTE — Progress Notes (Signed)
Subjective:    Patient ID: Jenna [name], female    DOB: 1933/09/19, 82 y.o.   MRN: [id number]  HPI: [name] is a 82 year old female who returns for follow up for chronic pain and medication refill. She says her pain is located in her left shoulder and lower back mainly right side. She rates her pain 5. Her current exercise regime is walking.   Pain Inventory Average Pain 6 Pain Right Now 5 My pain is intermittent, sharp and aching  In the last 24 hours, has pain interfered with the following? General activity 6 Relation with others 2 Enjoyment of life 7 What TIME of day is your pain at its worst? daytime, evening Sleep (in general) Fair  Pain is worse with: standing and some activites Pain improves with: rest and medication Relief from Meds: 5  Mobility walk without assistance how many minutes can you walk? 15-20 ability to climb steps?  yes do you drive?  yes Do you have any goals in this area?  yes  Function retired Do you have any goals in this area?  no  Neuro/Psych No problems in this area  Prior Studies Any changes since last visit?  no  Physicians involved in your care Any changes since last visit?  no   Family History  Problem Relation Age of Onset  . Stroke Mother   . Heart attack Father   . Diabetes Brother   . Heart disease Brother    Social History   Social History  . Marital Status: Married    Spouse Name: N/A  . Number of Children: N/A  . Years of Education: N/A   Social History Main Topics  . Smoking status: Former Smoker -- 1.00 packs/day for 15 years    Quit date: 11/03/1973  . Smokeless tobacco: Never Used  . Alcohol Use: 3.6 oz/week    6 Standard drinks or equivalent per week     Comment: occ wine w/ supper  . Drug Use: No  . Sexual Activity:    Partners: Male    Birth Control/ Protection: Surgical     Comment: hysterectomy   Other Topics Concern  . None   Social History Narrative   Past Surgical History   Procedure Laterality Date  . Rotator cuff repair Left 1996; 1981  . Cholecystectomy    . Total hip arthroplasty Left 10/2000  . Total hip arthroplasty Right 06/2005  . Rotator cuff repair Right 1996  . Vaginal delivery      x1  . Rotator cuff  Bilateral 1996/1998    repair  . Lipoma excision Left 10/1999    left thigh  . Total hip arthroplasty Left     10/2000  . Lung biopsy  05/2001    /w Dr.Burney- bronchoscopy, mediastinoscopy  . Lumbosacral cyst  2005    seen on injection for low back pain   . Appendectomy  1986    along with chole  . Breast biopsy Right 1997    sclerosing adenosis -Dr. Lucia Gaskins  . Abdominal hysterectomy  1982    BSO  . Tonsillectomy    . Anterior cervical decomp/discectomy fusion N/A 10/03/2014    Procedure: ANTERIOR CERVICAL DECOMPRESSION/DISCECTOMY FUSION 1 LEVEL,CERVICAL THREE-FOUR;  Surgeon: [name], MD;  Location: [place] [facility];  Service: Neurosurgery;  Laterality: N/A;   Past Medical History  Diagnosis Date  . Lumbosacral spondylosis without myelopathy   . Pain in joint, upper arm   . Other chronic  postoperative pain   . Spinal stenosis, lumbar region, without neurogenic claudication   . Osteoarthritis   . Thyroid disease     hypothyroidism  . Hypercholesteremia   . Cervical spinal stenosis   . Hypertension   . 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 Swallowing dysfunction 2015    especially on thin liquids, study done 01/2014  . Dysrhythmia     (?or extra beats) treated /w atenolol  . Pneumonia   . Neuromuscular disorder ([place])     esophageal spasms at times   . Melanoma ([place])     on left leg, wide excision   . History of blood transfusion    BP 150/61 mmHg  Pulse 73  Resp 16  SpO2 100%  LMP 11/03/1980 (Approximate)  Opioid Risk Score:   Fall Risk Score:  `1  Depression screen PHQ 2/9  Depression screen PHQ 2/9 01/11/2015  Decreased Interest 0  Down, Depressed, Hopeless 0  PHQ  - 2 Score 0  Altered sleeping 0  Tired, decreased energy 1  Change in appetite 0  Feeling bad or failure about yourself  0  Trouble concentrating 1  Moving slowly or fidgety/restless 0  Suicidal thoughts 0  PHQ-9 Score 2     Review of Systems  All other systems reviewed and are negative.      Objective:   Physical Exam  Constitutional: She is oriented to person, place, and time. She appears well-developed and well-nourished.  HENT:  Head: Normocephalic and atraumatic.  Neck: Normal range of motion. Neck supple.  Cardiovascular: Normal rate and regular rhythm.   Pulmonary/Chest: Effort normal and breath sounds normal.  Musculoskeletal:  Normal Muscle Bulk and Muscle Testing Reveals: Upper Extremities: Right:Full ROM and Muscle Strength 5/5 Left: Decreased ROM 45 Degrees and Muscle Strength 5/5 Back without spinal or paraspinal tenderness Lower Extremities: Full ROM and Muscle Strength 5/5 Arises from chair with ease Narrow Based gait   Neurological: She is alert and oriented to person, place, and time.  Skin: Skin is warm and dry.  Psychiatric: She has a normal mood and affect.  Nursing note and vitals reviewed.         Assessment & Plan:  1. Chronic low back pain with lumbar spondylosis.  Refilled: Meperidine 50 mg one tablet twice a day #60.  2. Cervical stenosis with radiculitis : S/P Anterior Cervical Fusion C3-C4 with Dr. Ellene Route on 10/03/2014 3. Chronic bilateral shoulder pain L>R: Continue with Current medication regime. Surgery Pending with Dr. Veverly Fells Ortho Following.   20 minutes of face to face patient care time was spent during this visit. All questions was encouraged and answered.   F/U in 1 month

## 2015-11-14 ENCOUNTER — Ambulatory Visit
Admission: RE | Admit: 2015-11-14 | Discharge: 2015-11-14 | Disposition: A | Discharge status: Home / Self Care | Payer: Medicare Other | Source: Ambulatory Visit | Attending: Physician Assistant | Admitting: Physician Assistant

## 2015-11-14 DIAGNOSIS — M19012 Primary osteoarthritis, left shoulder: Secondary | ICD-10-CM

## 2015-12-13 ENCOUNTER — Encounter: Payer: Medicare Other | Attending: Physical Medicine and Rehabilitation | Admitting: Registered Nurse

## 2015-12-13 ENCOUNTER — Encounter: Payer: Self-pay | Admitting: Registered Nurse

## 2015-12-13 VITALS — BP 148/67 | HR 76

## 2015-12-13 DIAGNOSIS — M12811 Other specific arthropathies, not elsewhere classified, right shoulder: Secondary | ICD-10-CM | POA: Diagnosis present

## 2015-12-13 DIAGNOSIS — M12812 Other specific arthropathies, not elsewhere classified, left shoulder: Secondary | ICD-10-CM | POA: Insufficient documentation

## 2015-12-13 DIAGNOSIS — M47816 Spondylosis without myelopathy or radiculopathy, lumbar region: Secondary | ICD-10-CM | POA: Diagnosis not present

## 2015-12-13 DIAGNOSIS — M25512 Pain in left shoulder: Secondary | ICD-10-CM

## 2015-12-13 DIAGNOSIS — Z79899 Other long term (current) drug therapy: Secondary | ICD-10-CM | POA: Diagnosis present

## 2015-12-13 DIAGNOSIS — M47812 Spondylosis without myelopathy or radiculopathy, cervical region: Secondary | ICD-10-CM | POA: Diagnosis present

## 2015-12-13 DIAGNOSIS — Z5181 Encounter for therapeutic drug level monitoring: Secondary | ICD-10-CM | POA: Diagnosis not present

## 2015-12-13 DIAGNOSIS — G894 Chronic pain syndrome: Secondary | ICD-10-CM

## 2015-12-13 MED ORDER — MEPERIDINE HCL 50 MG PO TABS: 50.0000 mg | ORAL_TABLET | Freq: Three times a day (TID) | ORAL | Status: DC

## 2015-12-13 NOTE — Progress Notes (Signed)
Subjective:    Patient ID: Jenna [name], female    DOB: February 25, 1933, 82 y.o.   MRN: [id number]  HPI: Jenna [name] is a 82 year old female who returns for follow up for chronic pain and medication refill. She says her pain is located in her left shoulder and lower back. Also states her shoulder pain has intensified, we will increase her Demerol to TID, discussed with Dr. Letta Pate he agrees with plan. She rates her pain 5. Her current exercise regime is walking.  Jenna [name] is scheduled for Left Reversed Total Shoulder Arthroplasty on 02/01/2016.  Pain Inventory Average Pain 8 Pain Right Now 5 My pain is constant, sharp and aching  In the last 24 hours, has pain interfered with the following? General activity 7 Relation with others 3 Enjoyment of life 7 What TIME of day is your pain at its worst? daytime and evening Sleep (in general) Fair  Pain is worse with: standing and some activites Pain improves with: pacing activities and medication Relief from Meds: 4  Mobility walk without assistance how many minutes can you walk? 20 ability to climb steps?  yes do you drive?  yes  Function retired  Neuro/Psych No problems in this area  Prior Studies Any changes since last visit?  yes CT/MRI for shoulder surgery 02/01/16  Physicians involved in your care Any changes since last visit?  no Orthopedist [name]   Family History  Problem Relation Age of Onset  . Stroke Mother   . Heart attack Father   . Diabetes Brother   . Heart disease Brother    Social History   Social History  . Marital Status: Married    Spouse Name: N/A  . Number of Children: N/A  . Years of Education: N/A   Social History Main Topics  . Smoking status: Former Smoker -- 1.00 packs/day for 15 years    Quit date: 11/03/1973  . Smokeless tobacco: Never Used  . Alcohol Use: 3.6 oz/week    6 Standard drinks or equivalent per week     Comment: occ wine w/ supper  . Drug Use: No  .  Sexual Activity:    Partners: Male    Birth Control/ Protection: Surgical     Comment: hysterectomy   Other Topics Concern  . None   Social History Narrative   Past Surgical History  Procedure Laterality Date  . Rotator cuff repair Left 1996; 1981  . Cholecystectomy    . Total hip arthroplasty Left 10/2000  . Total hip arthroplasty Right 06/2005  . Rotator cuff repair Right 1996  . Vaginal delivery      x1  . Rotator cuff  Bilateral 1996/1998    repair  . Lipoma excision Left 10/1999    left thigh  . Total hip arthroplasty Left     10/2000  . Lung biopsy  05/2001    /w Dr.Burney- bronchoscopy, mediastinoscopy  . Lumbosacral cyst  2005    seen on injection for low back pain   . Appendectomy  1986    along with chole  . Breast biopsy Right 1997    sclerosing adenosis -Dr. Lucia Gaskins  . Abdominal hysterectomy  1982    BSO  . Tonsillectomy    . Anterior cervical decomp/discectomy fusion N/A 10/03/2014    Procedure: ANTERIOR CERVICAL DECOMPRESSION/DISCECTOMY FUSION 1 LEVEL,CERVICAL THREE-FOUR;  Surgeon: [name], MD;  Location: [place] [facility];  Service: Neurosurgery;  Laterality: N/A;   Past Medical History  Diagnosis  Date  . Lumbosacral spondylosis without myelopathy   . Pain in joint, upper arm   . Other chronic postoperative pain   . Spinal stenosis, lumbar region, without neurogenic claudication   . Osteoarthritis   . Thyroid disease     hypothyroidism  . Hypercholesteremia   . Cervical spinal stenosis   . Hypertension   . 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 Swallowing dysfunction 2015    especially on thin liquids, study done 01/2014  . Dysrhythmia     (?or extra beats) treated /w atenolol  . Pneumonia   . Neuromuscular disorder ([place])     esophageal spasms at times   . Melanoma ([place])     on left leg, wide excision   . History of blood transfusion    BP 148/67 mmHg  Pulse 76  SpO2 94%  LMP 11/03/1980  (Approximate)  Opioid Risk Score:   Fall Risk Score:  `1  Depression screen PHQ 2/9  Depression screen [facility] 2/9 12/13/2015 01/11/2015  Decreased Interest 0 0  Down, Depressed, Hopeless 0 0  PHQ - 2 Score 0 0  Altered sleeping - 0  Tired, decreased energy - 1  Change in appetite - 0  Feeling bad or failure about yourself  - 0  Trouble concentrating - 1  Moving slowly or fidgety/restless - 0  Suicidal thoughts - 0  PHQ-9 Score - 2     Review of Systems  All other systems reviewed and are negative.      Objective:   Physical Exam  Constitutional: She is oriented to person, place, and time. She appears well-developed and well-nourished.  HENT:  Head: Normocephalic and atraumatic.  Neck: Normal range of motion. Neck supple.  Cardiovascular: Normal rate and regular rhythm.   Pulmonary/Chest: Effort normal and breath sounds normal.  Musculoskeletal:  Normal Muscle Bulk and Muscle Testing Reveals: Upper Extremities: Right: Full ROM and Muscle Strength 5/5 Left: Decreased ROM: 30 Degrees and Muscle Strength 4/5 Back without spinal or paraspinal tenderness Lower Extremities: Full ROM and Muscle Strength 5/5 Arises from chair slowly Narrow Based gait  Neurological: She is alert and oriented to person, place, and time.  Skin: Skin is warm and dry.  Psychiatric: She has a normal mood and affect.  Nursing note and vitals reviewed.         Assessment & Plan:  1. Chronic low back pain with lumbar spondylosis.  Refilled:Increased Meperidine 50 mg one tablet three times a day #90.  2. Cervical stenosis with radiculitis : S/P Anterior Cervical Fusion C3-C4 with Dr. Ellene Route on 10/03/2014 3. Chronic bilateral shoulder pain L>R: Continue with Current medication regime. Surgery Pending with Dr. Veverly Fells Ortho Following.  Scheduled for Left Reverse Total Shoulder Arthroplasty 02/01/16.  20 minutes of face to face patient care time was spent during this visit. All questions was  encouraged and answered.   F/U in 1 month

## 2016-01-08 ENCOUNTER — Encounter: Payer: Medicare Other | Admitting: Registered Nurse

## 2016-01-15 ENCOUNTER — Encounter: Payer: Medicare Other | Attending: Physical Medicine and Rehabilitation | Admitting: Registered Nurse

## 2016-01-15 ENCOUNTER — Encounter: Payer: Self-pay | Admitting: Registered Nurse

## 2016-01-15 VITALS — BP 143/77 | HR 79 | Resp 14

## 2016-01-15 DIAGNOSIS — M25512 Pain in left shoulder: Secondary | ICD-10-CM

## 2016-01-15 DIAGNOSIS — Z5181 Encounter for therapeutic drug level monitoring: Secondary | ICD-10-CM

## 2016-01-15 DIAGNOSIS — Z79899 Other long term (current) drug therapy: Secondary | ICD-10-CM | POA: Diagnosis not present

## 2016-01-15 DIAGNOSIS — G894 Chronic pain syndrome: Secondary | ICD-10-CM

## 2016-01-15 DIAGNOSIS — M47812 Spondylosis without myelopathy or radiculopathy, cervical region: Secondary | ICD-10-CM | POA: Diagnosis present

## 2016-01-15 DIAGNOSIS — M47816 Spondylosis without myelopathy or radiculopathy, lumbar region: Secondary | ICD-10-CM | POA: Diagnosis not present

## 2016-01-15 DIAGNOSIS — M12812 Other specific arthropathies, not elsewhere classified, left shoulder: Secondary | ICD-10-CM | POA: Insufficient documentation

## 2016-01-15 DIAGNOSIS — M12811 Other specific arthropathies, not elsewhere classified, right shoulder: Secondary | ICD-10-CM | POA: Insufficient documentation

## 2016-01-15 DIAGNOSIS — Z981 Arthrodesis status: Secondary | ICD-10-CM

## 2016-01-15 MED ORDER — MEPERIDINE HCL 50 MG PO TABS: 50.0000 mg | ORAL_TABLET | Freq: Three times a day (TID) | ORAL | Status: DC

## 2016-01-15 NOTE — Progress Notes (Signed)
Subjective:    Patient ID: Jenna [name], female    DOB: 10-19-1933, 82 y.o.   MRN: [id number]  HPI: [name] is a 82 year old female who returns for follow up for chronic pain and medication refill. She states her pain is located in her left shoulder.  She rates her pain 6. Her current exercise regime is walking.  [name] states her PCP diagnosed her with Asthmatic Bronchitis and she was prescribed antibiotics.  [name] is scheduled for Left Reversed Total Shoulder Arthroplasty on 02/01/2016 via Dr. Veverly Fells.  Pain Inventory Average Pain 6 Pain Right Now 6 My pain is intermittent, sharp and aching  In the last 24 hours, has pain interfered with the following? General activity 6 Relation with others 2 Enjoyment of life 6 What TIME of day is your pain at its worst? daytime, evening  Sleep (in general) Good  Pain is worse with: some activites Pain improves with: pacing activities and medication Relief from Meds: 7  Mobility walk without assistance how many minutes can you walk? 15-20 ability to climb steps?  yes do you drive?  yes Do you have any goals in this area?  yes  Function retired Do you have any goals in this area?  no  Neuro/Psych No problems in this area  Prior Studies Any changes since last visit?  no  Physicians involved in your care Any changes since last visit?  no   Family History  Problem Relation Age of Onset  . Stroke Mother   . Heart attack Father   . Diabetes Brother   . Heart disease Brother    Social History   Social History  . Marital Status: Married    Spouse Name: N/A  . Number of Children: N/A  . Years of Education: N/A   Social History Main Topics  . Smoking status: Former Smoker -- 1.00 packs/day for 15 years    Quit date: 11/03/1973  . Smokeless tobacco: Never Used  . Alcohol Use: 3.6 oz/week    6 Standard drinks or equivalent per week     Comment: occ wine w/ supper  . Drug Use: No  . Sexual Activity:    Partners: Male    Birth Control/ Protection: Surgical     Comment: hysterectomy   Other Topics Concern  . None   Social History Narrative   Past Surgical History  Procedure Laterality Date  . Rotator cuff repair Left 1996; 1981  . Cholecystectomy    . Total hip arthroplasty Left 10/2000  . Total hip arthroplasty Right 06/2005  . Rotator cuff repair Right 1996  . Vaginal delivery      x1  . Rotator cuff  Bilateral 1996/1998    repair  . Lipoma excision Left 10/1999    left thigh  . Total hip arthroplasty Left     10/2000  . Lung biopsy  05/2001    /w Dr.Burney- bronchoscopy, mediastinoscopy  . Lumbosacral cyst  2005    seen on injection for low back pain   . Appendectomy  1986    along with chole  . Breast biopsy Right 1997    sclerosing adenosis -Dr. Lucia Gaskins  . Abdominal hysterectomy  1982    BSO  . Tonsillectomy    . Anterior cervical decomp/discectomy fusion N/A 10/03/2014    Procedure: ANTERIOR CERVICAL DECOMPRESSION/DISCECTOMY FUSION 1 LEVEL,CERVICAL THREE-FOUR;  Surgeon: [name], MD;  Location: [place] [facility];  Service: Neurosurgery;  Laterality: N/A;   Past  Medical History  Diagnosis Date  . Lumbosacral spondylosis without myelopathy   . Pain in joint, upper arm   . Other chronic postoperative pain   . Spinal stenosis, lumbar region, without neurogenic claudication   . Osteoarthritis   . Thyroid disease     hypothyroidism  . Hypercholesteremia   . Cervical spinal stenosis   . Hypertension   . 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 Swallowing dysfunction 2015    especially on thin liquids, study done 01/2014  . Dysrhythmia     (?or extra beats) treated /w atenolol  . Pneumonia   . Neuromuscular disorder ([place])     esophageal spasms at times   . Melanoma ([place])     on left leg, wide excision   . History of blood transfusion    BP 143/77 mmHg  Pulse 79  Resp 14  SpO2 97%  LMP 11/03/1980  (Approximate)  Opioid Risk Score:   Fall Risk Score:  `1  Depression screen PHQ 2/9  Depression screen [facility] 2/9 12/13/2015 01/11/2015  Decreased Interest 0 0  Down, Depressed, Hopeless 0 0  PHQ - 2 Score 0 0  Altered sleeping - 0  Tired, decreased energy - 1  Change in appetite - 0  Feeling bad or failure about yourself  - 0  Trouble concentrating - 1  Moving slowly or fidgety/restless - 0  Suicidal thoughts - 0  PHQ-9 Score - 2     Review of Systems  Respiratory: Positive for cough.   All other systems reviewed and are negative.      Objective:   Physical Exam  Constitutional: She is oriented to person, place, and time. She appears well-developed and well-nourished.  HENT:  Head: Normocephalic and atraumatic.  Neck: Normal range of motion. Neck supple.  Cardiovascular: Normal rate and regular rhythm.   Pulmonary/Chest: Effort normal.  Coarse Rhonchi  Musculoskeletal:  Normal Muscle Bulk and Muscle testing Reveals: Upper Extremities: Right: Full ROM and Muscle Strength 5/5 Left: Decreased ROM 45 Degrees and Muscle Strength 4/5 Back without spinal or paraspinal tenderness Lower Extremities: Full ROM and  Muscle Strength 5/5 Arises from chair with ease Narrow Based Gait  Neurological: She is alert and oriented to person, place, and time.  Skin: Skin is warm and dry.  Psychiatric: She has a normal mood and affect.  Nursing note and vitals reviewed.         Assessment & Plan:  1. Chronic low back pain with lumbar spondylosis.  Refilled: Meperidine 50 mg one tablet three times a day #90.  2. Cervical stenosis with radiculitis : S/P Anterior Cervical Fusion C3-C4 with Dr. Ellene Route on 10/03/2014 3. Chronic bilateral shoulder pain L>R: Continue with Current medication regime. Scheduled for Left Reverse Total Shoulder Arthroplasty 02/01/16.  20 minutes of face to face patient care time was spent during this visit. All questions was encouraged and answered.   F/U in 1  month

## 2016-01-17 NOTE — H&P (Signed)
Jenna [name] is an 82 y.o. female.    Chief Complaint: left shoulder pain  HPI: Pt is a 82 y.o. female complaining of left shoulder pain for multiple years. Pain had continually increased since the beginning. X-rays in the clinic show end-stage arthritic changes of the left shoulder. Pt has tried various conservative treatments which have failed to alleviate their symptoms, including injections and therapy. Various options are discussed with the patient. Risks, benefits and expectations were discussed with the patient. Patient understand the risks, benefits and expectations and wishes to proceed with surgery.   PCP:  Myriam Jacobson, MD  D/C Plans: Home  PMH: Past Medical History  Diagnosis Date  . Lumbosacral spondylosis without myelopathy   . Pain in joint, upper arm   . Other chronic postoperative pain   . Spinal stenosis, lumbar region, without neurogenic claudication   . Osteoarthritis   . Thyroid disease     hypothyroidism  . Hypercholesteremia   . Cervical spinal stenosis   . Hypertension   . Sarcoidosis of lung ([place]) 05/2001    developed into pneumonia, resolved within a year  . Swallowing dysfunction 2015    especially on thin liquids, study done 01/2014  . Dysrhythmia     (?or extra beats) treated /w atenolol  . Pneumonia   . Neuromuscular disorder ([place])     esophageal spasms at times   . Melanoma ([place])     on left leg, wide excision   . History of blood transfusion     PSH: Past Surgical History  Procedure Laterality Date  . Rotator cuff repair Left 1996; 1981  . Cholecystectomy    . Total hip arthroplasty Left 10/2000  . Total hip arthroplasty Right 06/2005  . Rotator cuff repair Right 1996  . Vaginal delivery      x1  . Rotator cuff  Bilateral 1996/1998    repair  . Lipoma excision Left 10/1999    left thigh  . Total hip arthroplasty Left     10/2000  . Lung biopsy  05/2001    /w Dr.Burney- bronchoscopy, mediastinoscopy  . Lumbosacral cyst   2005    seen on injection for low back pain   . Appendectomy  1986    along with chole  . Breast biopsy Right 1997    sclerosing adenosis -Dr. Lucia Gaskins  . Abdominal hysterectomy  1982    BSO  . Tonsillectomy    . Anterior cervical decomp/discectomy fusion N/A 10/03/2014    Procedure: ANTERIOR CERVICAL DECOMPRESSION/DISCECTOMY FUSION 1 LEVEL,CERVICAL THREE-FOUR;  Surgeon: [name], MD;  Location: [facility];  Service: Neurosurgery;  Laterality: N/A;    Social History:  reports that she quit smoking about 42 years ago. She has never used smokeless tobacco. She reports that she drinks about 3.6 oz of alcohol per week. She reports that she does not use illicit drugs.  Allergies:  Allergies  Allergen Reactions  . Codeine Shortness Of Breath  . Darvon Shortness Of Breath  . Pseudoephedrine Hcl Shortness Of Breath  . Tylenol [Acetaminophen] Shortness Of Breath and Palpitations  . Ultram [Tramadol] Shortness Of Breath  . Vicodin [Hydrocodone-Acetaminophen] Shortness Of Breath  . Dalmane [Flurazepam Hcl] Other (See Comments)    confusion  . Cephalexin Nausea Only  . Clarithromycin Nausea Only  . Doxycycline Calcium Rash    Medications: No current facility-administered medications for this encounter.   Current Outpatient Prescriptions  Medication Sig Dispense Refill  . aspirin 81 MG tablet Take 81  mg by mouth daily.     [name] atorvastatin (LIPITOR) 80 MG tablet Take 40 mg by mouth daily.    . calcium carbonate (OS-CAL) 600 MG TABS Take 600 mg by mouth 2 (two) times daily.    . carvedilol (COREG) 12.5 MG tablet Take 12.5 mg by mouth 2 (two) times daily with a meal.    . cholecalciferol (VITAMIN D) 1000 UNITS tablet Take 1,000 Units by mouth daily.    . clidinium-chlordiazePOXIDE (LIBRAX) 5-2.5 MG per capsule Take 1 capsule by mouth 3 (three) times daily as needed (for IBS).    [name] conjugated estrogens (PREMARIN) vaginal cream Use 1/2 g vaginally twice weekly 42.5 g 3  . diazepam (VALIUM)  5 MG tablet Take 1 tablet by mouth at bedtime as needed for anxiety.     [name] estradiol (VIVELLE-DOT) 0.025 MG/24HR Use 1 patch twice weekly 24 patch 3  . fexofenadine (ALLEGRA) 180 MG tablet Take 180 mg by mouth daily.    . fluticasone (FLONASE) 50 MCG/ACT nasal spray Place 2 sprays into both nostrils 2 (two) times daily.    . hydrochlorothiazide (MICROZIDE) 12.5 MG capsule Take 1 capsule (12.5 mg total) by mouth daily. 30 capsule 0  . hyoscyamine (LEVSIN SL) 0.125 MG SL tablet Place 0.125-0.25 mg under the tongue every 4 (four) hours as needed for cramping.     [name] ibuprofen (ADVIL,MOTRIN) 200 MG tablet Take 400 mg by mouth every 6 (six) hours as needed (for pain).     [name] levothyroxine (SYNTHROID, LEVOTHROID) 112 MCG tablet Take 112 mcg by mouth daily before breakfast.    . losartan (COZAAR) 100 MG tablet Take 1 tablet (100 mg total) by mouth daily. 30 tablet 0  . meperidine (DEMEROL) 50 MG tablet Take 1 tablet (50 mg total) by mouth 3 (three) times daily. 90 tablet 0  . methocarbamol (ROBAXIN) 500 MG tablet Take 1 tablet (500 mg total) by mouth every 6 (six) hours as needed for muscle spasms. 60 tablet 0  . Multiple Vitamin (MULTIVITAMIN) tablet Take 1 tablet by mouth daily.    [name] omeprazole (PRILOSEC) 40 MG capsule Take 40 mg by mouth 2 (two) times daily.     . promethazine (PHENERGAN) 25 MG tablet TAKE 1 TABLET BY MOUTH TWICE DAILY AS NEEDED. 60 tablet 11    No results found for this or any previous visit (from the past 48 hour(s)). No results found.  ROS: Pain with rom of the left upper extremity  Physical Exam:  Alert and oriented 82 y.o. female in no acute distress Cranial nerves 2-12 intact Cervical spine: full rom with no tenderness, nv intact distally Chest: active breath sounds bilaterally, no wheeze rhonchi or rales Heart: regular rate and rhythm, no murmur Abd: non tender non distended with active bowel sounds Hip is stable with rom  Left shoulder with limited rom and  weakness nv intact distally No rashes   Assessment/Plan Assessment: left rotator cuff insufficiency  Plan: Patient will undergo a left reverse total shoulder by Dr. Veverly Fells at [facility]. Risks benefits and expectations were discussed with the patient. Patient understand risks, benefits and expectations and wishes to proceed.

## 2016-01-19 LAB — TOXASSURE SELECT,+ANTIDEPR,UR: PDF: 0

## 2016-01-22 ENCOUNTER — Ambulatory Visit (HOSPITAL_COMMUNITY)
Admission: RE | Admit: 2016-01-22 | Discharge: 2016-01-22 | Disposition: A | Discharge status: Home / Self Care | Payer: Medicare Other | Source: Ambulatory Visit | Attending: Anesthesiology | Admitting: Anesthesiology

## 2016-01-22 ENCOUNTER — Telehealth: Payer: Self-pay | Admitting: Nurse Practitioner

## 2016-01-22 ENCOUNTER — Encounter (HOSPITAL_COMMUNITY)
Admission: RE | Admit: 2016-01-22 | Discharge: 2016-01-22 | Disposition: A | Discharge status: Home / Self Care | Payer: Medicare Other | Source: Ambulatory Visit | Attending: Orthopedic Surgery | Admitting: Orthopedic Surgery

## 2016-01-22 ENCOUNTER — Encounter (HOSPITAL_COMMUNITY): Payer: Self-pay

## 2016-01-22 DIAGNOSIS — Z01812 Encounter for preprocedural laboratory examination: Secondary | ICD-10-CM | POA: Diagnosis not present

## 2016-01-22 DIAGNOSIS — J988 Other specified respiratory disorders: Secondary | ICD-10-CM | POA: Insufficient documentation

## 2016-01-22 DIAGNOSIS — Z01818 Encounter for other preprocedural examination: Secondary | ICD-10-CM | POA: Diagnosis present

## 2016-01-22 DIAGNOSIS — J22 Unspecified acute lower respiratory infection: Secondary | ICD-10-CM

## 2016-01-22 HISTORY — DX: Pain in unspecified joint: M25.50

## 2016-01-22 HISTORY — DX: Dysphagia, unspecified: R13.10

## 2016-01-22 HISTORY — DX: Candidiasis, unspecified: B37.9

## 2016-01-22 HISTORY — DX: Localized edema: R60.0

## 2016-01-22 HISTORY — DX: Edema, unspecified: R60.9

## 2016-01-22 HISTORY — DX: Irritable bowel syndrome, unspecified: K58.9

## 2016-01-22 HISTORY — DX: Personal history of other diseases of the digestive system: Z87.19

## 2016-01-22 HISTORY — DX: Other chronic pain: G89.29

## 2016-01-22 HISTORY — DX: Unspecified cataract: H26.9

## 2016-01-22 HISTORY — DX: Emphysema, unspecified: J43.9

## 2016-01-22 HISTORY — DX: Unspecified asthma, uncomplicated: J45.909

## 2016-01-22 HISTORY — DX: Hypothyroidism, unspecified: E03.9

## 2016-01-22 HISTORY — DX: Other specified disorders of veins: I87.8

## 2016-01-22 HISTORY — DX: Reserved for inherently not codable concepts without codable children: IMO0001

## 2016-01-22 HISTORY — DX: Other muscle spasm: M62.838

## 2016-01-22 HISTORY — DX: Personal history of peptic ulcer disease: Z87.11

## 2016-01-22 HISTORY — DX: Dorsalgia, unspecified: M54.9

## 2016-01-22 HISTORY — DX: Unspecified hearing loss, unspecified ear: H91.90

## 2016-01-22 HISTORY — DX: Other seasonal allergic rhinitis: J30.2

## 2016-01-22 HISTORY — DX: Gastro-esophageal reflux disease without esophagitis: K21.9

## 2016-01-22 LAB — SURGICAL PCR SCREEN
MRSA, PCR: NEGATIVE
Staphylococcus aureus: NEGATIVE

## 2016-01-22 MED ORDER — FLUCONAZOLE 150 MG PO TABS: 150.0000 mg | ORAL_TABLET | Freq: Once | ORAL | Status: DC

## 2016-01-22 MED ORDER — CHLORHEXIDINE GLUCONATE 4 % EX LIQD: 60.0000 mL | Freq: Once | CUTANEOUS | Status: DC

## 2016-01-22 NOTE — Telephone Encounter (Signed)
Patient wants to see if she can have diflucan called into her pharmacy. She has been taking an antibiotic for a chest infection and now has a yeast infection. Pt is aware we do not treat over the phone but insists that we normally will do this for her. Offered an appointment but patient declined.

## 2016-01-22 NOTE — Telephone Encounter (Signed)
She may have Diflucan with her history of 2 weeks of antibiotics and yeast vaginitis symptoms.

## 2016-01-22 NOTE — Telephone Encounter (Signed)
Rx for Diflucan 150 mg #2 0RF sent to [facility] on file. Spoke with patient's husband Gwyndolyn Saxon as the patient is not available, okay per ROI. Advised rx has been sent to [facility] on file. He is agreeable and states he will let the patient know.  Routing to provider for final review. Patient agreeable to disposition. Will close encounter.

## 2016-01-22 NOTE — Progress Notes (Addendum)
Cardiologist denies   Medical Md is Dr.Ronald Mancel Bale  Echo in epic from 08-07-15  Stress test 10+ yrs ago  Heart cath denies  EKG to request from Dr.Roberts,states was done in 10/2015  CXR in epic from 05-16-15.To repeat d/t recent treatment for asthmatic bronchitis  Sleep study denies

## 2016-01-22 NOTE — Telephone Encounter (Signed)
Spoke with patient. Patient states that she was recently placed on a two week course of antibiotics for a chest infection by her PCP. Finished the course of antibiotics 1 week ago. A couple of days ago she began to have vaginal itching and irritation. Denies any vaginal discharge. Patient is requesting an rx for Diflucan for treatment of yeast infection. Advised I will speak with Kem Boroughs, FNP regarding medication request and return call. She is agreeable.  Kem Boroughs, FNP okay for Diflucan 150 mg #2 0RF?

## 2016-01-22 NOTE — Pre-Procedure Instructions (Signed)
Jenna [name]  01/22/2016      [id number] - [place], [place] - [address] AT [address] [place] [address] Phone: [phone] Fax: [phone]  [id number] [place], [place] [id number] [place] [id number] Phone: [phone] Fax: [phone]  [place], [place] [address] Phone: [phone] Fax: [phone]    Your procedure is scheduled on Fri, Mar 31 @ 7:30 AM  Report to [place] at 5:30 AM  Call this number if you have problems the morning of surgery:  [phone]   Remember:  Do not eat food or drink liquids after midnight.  Take these medicines the morning of surgery with A SIP OF WATER Carvedilol(Coreg),Allegra(Fexofenadine),Flonase(Fluticasone),Synthroid(Levothyroxine),Omeprazole(Prilosec),Eye Drops, and Phenergan(Promethazine-if needed)              Stop taking your Aspirin along with any Vitamins or Herbal Medications. No Goody's,BC's,Aleve,Advil,Motrin,or Ibuprofen.                 Do not wear jewelry, make-up or nail polish.  Do not wear lotions, powders, or perfumes.   Do not shave 48 hours prior to surgery.   Do not bring valuables to the hospital.  [facility] is not responsible for any belongings or valuables.  Contacts, dentures or bridgework may not be worn into surgery.  Leave your suitcase in the car.  After surgery it may be brought to your room.  For patients admitted to the hospital, discharge time will be determined by your treatment team.  Patients discharged the day of surgery will not be allowed to drive home.    Special instructions:  [facility] - Preparing for Surgery  Before surgery, you can play an important role.  Because skin is not sterile, your skin needs to be as free of germs as  possible.  You can reduce the number of germs on you skin by washing with CHG (chlorahexidine gluconate) soap before surgery.  CHG is an antiseptic cleaner which kills germs and bonds with the skin to continue killing germs even after washing.  Please DO NOT use if you have an allergy to CHG or antibacterial soaps.  If your skin becomes reddened/irritated stop using the CHG and inform your nurse when you arrive at Short Stay.  Do not shave (including legs and underarms) for at least 48 hours prior to the first CHG shower.  You may shave your face.  Please follow these instructions carefully:   1.  Shower with CHG Soap the night before surgery and the                                morning of Surgery.  2.  If you choose to wash your hair, wash your hair first as usual with your       normal shampoo.  3.  After you shampoo, rinse your hair and body thoroughly to remove the                      Shampoo.  4.  Use CHG as you would any other liquid soap.  You can apply chg directly       to the skin and wash gently with scrungie or a clean washcloth.  5.  Apply the CHG Soap to your body ONLY FROM THE NECK DOWN.        Do not use on open wounds or open sores.  Avoid contact with your eyes,       ears, mouth and genitals (private parts).  Wash genitals (private parts)       with your normal soap.  6.  Wash thoroughly, paying special attention to the area where your surgery        will be performed.  7.  Thoroughly rinse your body with warm water from the neck down.  8.  DO NOT shower/wash with your normal soap after using and rinsing off       the CHG Soap.  9.  Pat yourself dry with a clean towel.            10.  Wear clean pajamas.            11.  Place clean sheets on your bed the night of your first shower and do not        sleep with pets.  Day of Surgery  Do not apply any lotions/deoderants the morning of surgery.  Please wear clean clothes to the hospital/surgery center.    Please read over  the following fact sheets that you were given. Pain Booklet, Coughing and Deep Breathing, MRSA Information and Surgical Site Infection Prevention

## 2016-01-24 IMAGING — DX DG THORACIC SPINE 3V
3 series · 3 of 3 positions shown · non-contrast
Comparison: 06/23/2005

CLINICAL DATA: Injury post fall

EXAM:
THORACIC SPINE - 2 VIEW + SWIMMERS

[t-spine ap]
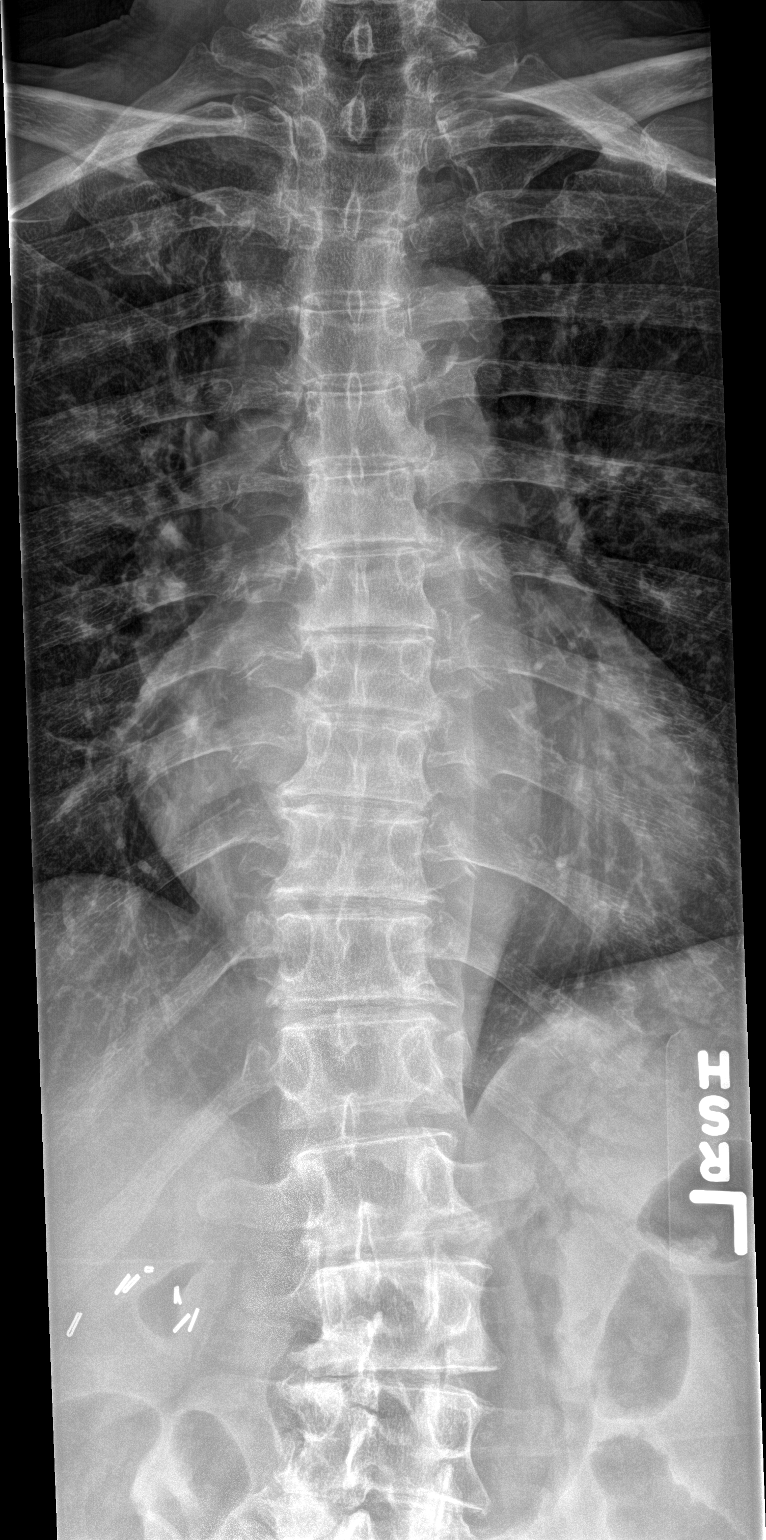

[t-spine lat]
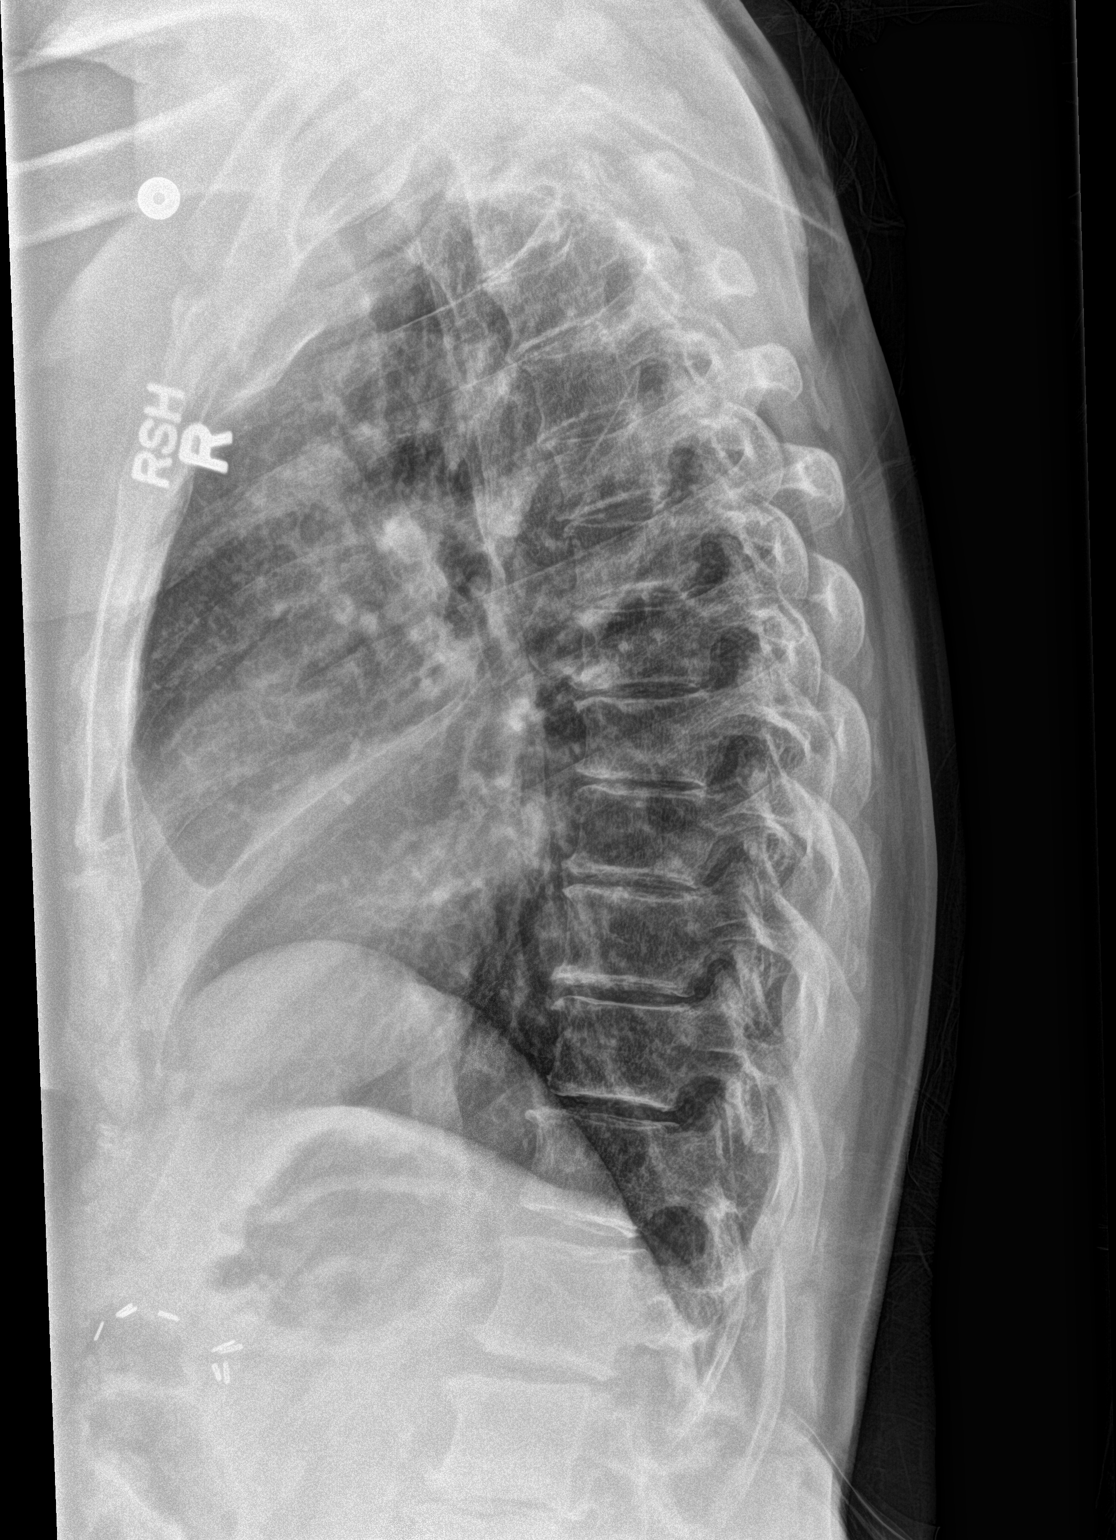

[t-spine swimmers]
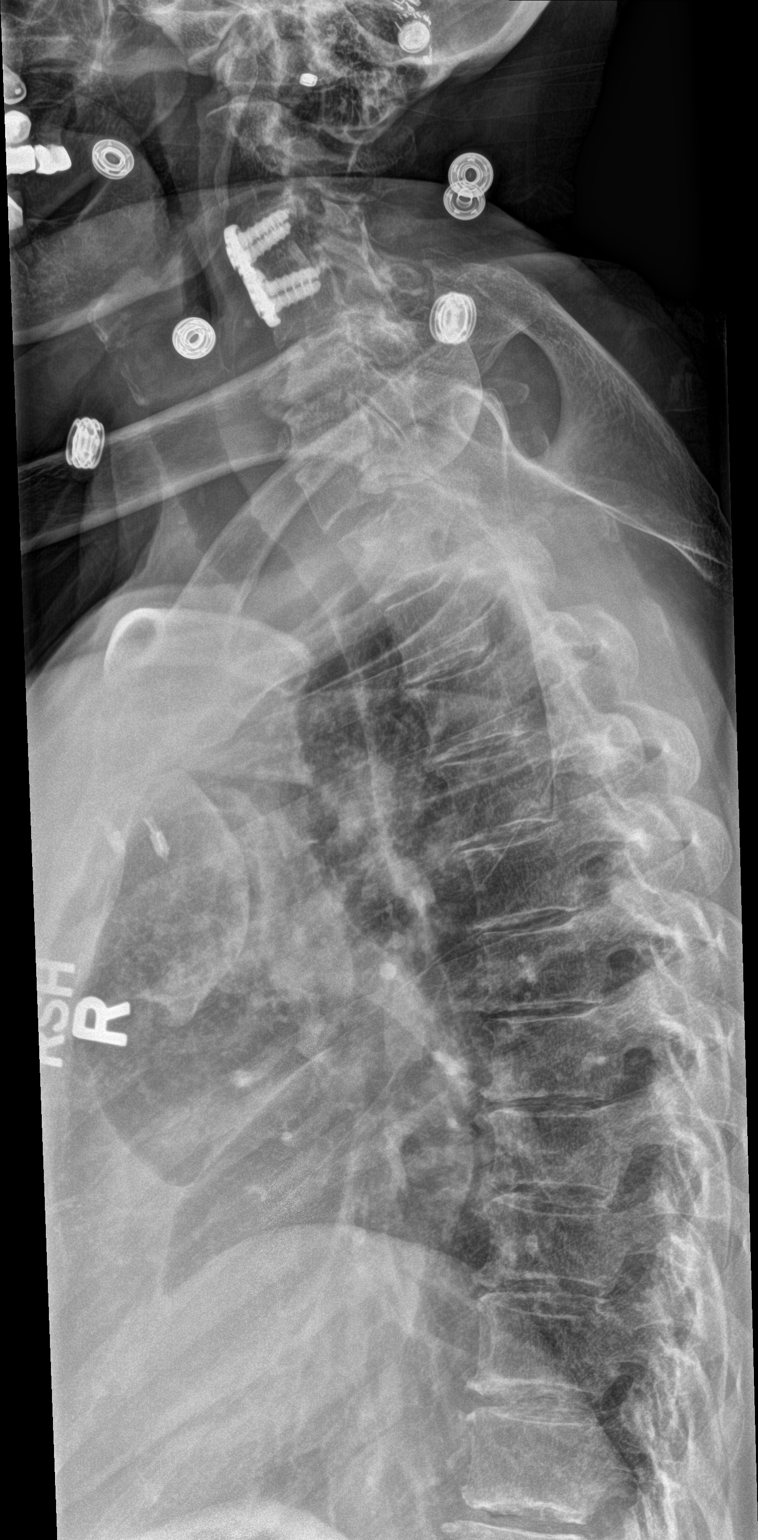

[3 of 3 positions shown; findings below may reference images not displayed]

FINDINGS: Three views of thoracic spine submitted. No acute fracture or
subluxation. Mild lumbar levoscoliosis. Mild degenerative changes
with anterior spurring mid and lower thoracic spine.
IMPRESSION: No acute fracture or subluxation.  Mild degenerative changes.

## 2016-01-24 NOTE — Progress Notes (Signed)
Urine drug screen for this encounter is consistent for prescribed medication 

## 2016-02-01 ENCOUNTER — Inpatient Hospital Stay (HOSPITAL_COMMUNITY): Payer: Medicare Other | Admitting: Anesthesiology

## 2016-02-01 ENCOUNTER — Inpatient Hospital Stay (HOSPITAL_COMMUNITY)
Admission: RE | Admit: 2016-02-01 | Discharge: 2016-02-03 | DRG: 483 | Disposition: A | Discharge status: Home Health | Payer: Medicare Other | Source: Ambulatory Visit | Attending: Orthopedic Surgery | Admitting: Orthopedic Surgery

## 2016-02-01 ENCOUNTER — Encounter (HOSPITAL_COMMUNITY)
Admission: RE | Disposition: A | Discharge status: Home Health | Payer: Self-pay | Source: Ambulatory Visit | Attending: Orthopedic Surgery

## 2016-02-01 ENCOUNTER — Encounter (HOSPITAL_COMMUNITY): Payer: Self-pay | Admitting: General Practice

## 2016-02-01 ENCOUNTER — Inpatient Hospital Stay (HOSPITAL_COMMUNITY): Payer: Medicare Other

## 2016-02-01 DIAGNOSIS — Z87891 Personal history of nicotine dependence: Secondary | ICD-10-CM

## 2016-02-01 DIAGNOSIS — Z7982 Long term (current) use of aspirin: Secondary | ICD-10-CM | POA: Diagnosis not present

## 2016-02-01 DIAGNOSIS — M75102 Unspecified rotator cuff tear or rupture of left shoulder, not specified as traumatic: Secondary | ICD-10-CM | POA: Diagnosis present

## 2016-02-01 DIAGNOSIS — Z96612 Presence of left artificial shoulder joint: Secondary | ICD-10-CM

## 2016-02-01 DIAGNOSIS — Z886 Allergy status to analgesic agent status: Secondary | ICD-10-CM

## 2016-02-01 DIAGNOSIS — Z85820 Personal history of malignant melanoma of skin: Secondary | ICD-10-CM | POA: Diagnosis not present

## 2016-02-01 DIAGNOSIS — M12812 Other specific arthropathies, not elsewhere classified, left shoulder: Principal | ICD-10-CM | POA: Diagnosis present

## 2016-02-01 DIAGNOSIS — Z888 Allergy status to other drugs, medicaments and biological substances status: Secondary | ICD-10-CM | POA: Diagnosis not present

## 2016-02-01 DIAGNOSIS — K219 Gastro-esophageal reflux disease without esophagitis: Secondary | ICD-10-CM | POA: Diagnosis present

## 2016-02-01 DIAGNOSIS — M47812 Spondylosis without myelopathy or radiculopathy, cervical region: Secondary | ICD-10-CM | POA: Diagnosis present

## 2016-02-01 DIAGNOSIS — G8928 Other chronic postprocedural pain: Secondary | ICD-10-CM | POA: Diagnosis present

## 2016-02-01 DIAGNOSIS — E039 Hypothyroidism, unspecified: Secondary | ICD-10-CM | POA: Diagnosis present

## 2016-02-01 DIAGNOSIS — Z96643 Presence of artificial hip joint, bilateral: Secondary | ICD-10-CM | POA: Diagnosis present

## 2016-02-01 DIAGNOSIS — M25512 Pain in left shoulder: Secondary | ICD-10-CM | POA: Diagnosis present

## 2016-02-01 DIAGNOSIS — Z96619 Presence of unspecified artificial shoulder joint: Secondary | ICD-10-CM

## 2016-02-01 DIAGNOSIS — Z79899 Other long term (current) drug therapy: Secondary | ICD-10-CM | POA: Diagnosis not present

## 2016-02-01 HISTORY — PX: REVERSE SHOULDER ARTHROPLASTY: SHX5054

## 2016-02-01 LAB — BASIC METABOLIC PANEL
ANION GAP: 8 (ref 5–15)
BUN: 9 mg/dL (ref 6–20)
CO2: 25 mmol/L (ref 22–32)
Calcium: 9.2 mg/dL (ref 8.9–10.3)
Chloride: 101 mmol/L (ref 101–111)
Creatinine, Ser: 0.71 mg/dL (ref 0.44–1.00)
GFR calc Af Amer: >60 mL/min (ref >60)
Glucose, Bld: 95 mg/dL (ref 65–99)
POTASSIUM: 3.5 mmol/L (ref 3.5–5.1)
SODIUM: 134 mmol/L — AB (ref 135–145)

## 2016-02-01 LAB — CBC
HEMATOCRIT: 36 % (ref 36.0–46.0)
HEMOGLOBIN: 12.6 g/dL (ref 12.0–15.0)
MCH: 32.5 pg (ref 26.0–34.0)
MCHC: 35 g/dL (ref 30.0–36.0)
MCV: 92.8 fL (ref 78.0–100.0)
Platelets: 307 10*3/uL (ref 150–400)
RBC: 3.88 MIL/uL (ref 3.87–5.11)
RDW: 13 % (ref 11.5–15.5)
WBC: 6.9 10*3/uL (ref 4.0–10.5)

## 2016-02-01 SURGERY — ARTHROPLASTY, SHOULDER, TOTAL, REVERSE
Anesthesia: General | Site: Shoulder | Laterality: Left

## 2016-02-01 MED ORDER — HYDROCHLOROTHIAZIDE 12.5 MG PO CAPS
12.5000 mg | ORAL_CAPSULE | Freq: Every day | ORAL | Status: DC
  Administered 2016-02-02 – 2016-02-03 (×2): 12.5 mg via ORAL
  Filled 2016-02-01 (×3): qty 1

## 2016-02-01 MED ORDER — GLYCOPYRROLATE 0.2 MG/ML IJ SOLN
INTRAMUSCULAR | Status: DC | PRN
  Administered 2016-02-01: 0.4 mg via INTRAVENOUS

## 2016-02-01 MED ORDER — METOCLOPRAMIDE HCL 5 MG/ML IJ SOLN: 5.0000 mg | Freq: Three times a day (TID) | INTRAMUSCULAR | Status: DC | PRN

## 2016-02-01 MED ORDER — FENTANYL CITRATE (PF) 100 MCG/2ML IJ SOLN: 25.0000 ug | INTRAMUSCULAR | Status: DC | PRN

## 2016-02-01 MED ORDER — MEPERIDINE HCL 50 MG PO TABS
50.0000 mg | ORAL_TABLET | Freq: Three times a day (TID) | ORAL | Status: DC | PRN
  Administered 2016-02-01 – 2016-02-03 (×4): 50 mg via ORAL
  Filled 2016-02-01 (×7): qty 1

## 2016-02-01 MED ORDER — DIAZEPAM 5 MG PO TABS: 5.0000 mg | ORAL_TABLET | Freq: Every evening | ORAL | Status: DC | PRN

## 2016-02-01 MED ORDER — DOCUSATE SODIUM 100 MG PO CAPS
100.0000 mg | ORAL_CAPSULE | Freq: Two times a day (BID) | ORAL | Status: DC
  Administered 2016-02-01 – 2016-02-02 (×2): 100 mg via ORAL
  Filled 2016-02-01 (×2): qty 1

## 2016-02-01 MED ORDER — LEVOTHYROXINE SODIUM 112 MCG PO TABS
112.0000 ug | ORAL_TABLET | Freq: Every day | ORAL | Status: DC
  Administered 2016-02-02 – 2016-02-03 (×2): 112 ug via ORAL
  Filled 2016-02-01 (×2): qty 1

## 2016-02-01 MED ORDER — BUPIVACAINE-EPINEPHRINE (PF) 0.25% -1:200000 IJ SOLN
INTRAMUSCULAR | Status: AC
  Filled 2016-02-01: qty 30

## 2016-02-01 MED ORDER — FLUCONAZOLE 150 MG PO TABS: 150.0000 mg | ORAL_TABLET | Freq: Once | ORAL | Status: DC

## 2016-02-01 MED ORDER — FLUTICASONE PROPIONATE 50 MCG/ACT NA SUSP
2.0000 | Freq: Every day | NASAL | Status: DC | PRN
  Filled 2016-02-01: qty 16

## 2016-02-01 MED ORDER — ONDANSETRON HCL 4 MG/2ML IJ SOLN: 4.0000 mg | Freq: Once | INTRAMUSCULAR | Status: DC | PRN

## 2016-02-01 MED ORDER — VITAMIN D 1000 UNITS PO TABS
1000.0000 [IU] | ORAL_TABLET | Freq: Every day | ORAL | Status: DC
  Administered 2016-02-02 – 2016-02-03 (×2): 1000 [IU] via ORAL
  Filled 2016-02-01 (×2): qty 1

## 2016-02-01 MED ORDER — CALCIUM CARBONATE 1250 (500 CA) MG PO TABS
600.0000 mg | ORAL_TABLET | Freq: Two times a day (BID) | ORAL | Status: DC
  Administered 2016-02-02 – 2016-02-03 (×2): 625 mg via ORAL
  Filled 2016-02-01 (×4): qty 1

## 2016-02-01 MED ORDER — LORATADINE 10 MG PO TABS
10.0000 mg | ORAL_TABLET | Freq: Every day | ORAL | Status: DC
Start: 2016-02-02 — End: 2016-02-03
  Administered 2016-02-02 – 2016-02-03 (×2): 10 mg via ORAL
  Filled 2016-02-01 (×2): qty 1

## 2016-02-01 MED ORDER — LOSARTAN POTASSIUM 50 MG PO TABS
100.0000 mg | ORAL_TABLET | Freq: Every day | ORAL | Status: DC
  Administered 2016-02-02 – 2016-02-03 (×2): 100 mg via ORAL
  Filled 2016-02-01 (×3): qty 2

## 2016-02-01 MED ORDER — ESTRADIOL 0.05 MG/24HR TD PTWK: 0.0500 mg | MEDICATED_PATCH | TRANSDERMAL | Status: DC

## 2016-02-01 MED ORDER — BUPIVACAINE-EPINEPHRINE 0.25% -1:200000 IJ SOLN
INTRAMUSCULAR | Status: DC | PRN
  Administered 2016-02-01: 9 mL

## 2016-02-01 MED ORDER — LIDOCAINE HCL 4 % EX SOLN
CUTANEOUS | Status: DC | PRN
  Administered 2016-02-01: 4 mL via TOPICAL

## 2016-02-01 MED ORDER — ONDANSETRON HCL 4 MG PO TABS: 4.0000 mg | ORAL_TABLET | Freq: Four times a day (QID) | ORAL | Status: DC | PRN

## 2016-02-01 MED ORDER — FENTANYL CITRATE (PF) 100 MCG/2ML IJ SOLN
INTRAMUSCULAR | Status: DC | PRN
  Administered 2016-02-01: 50 ug via INTRAVENOUS

## 2016-02-01 MED ORDER — LIDOCAINE HCL (CARDIAC) 20 MG/ML IV SOLN
INTRAVENOUS | Status: AC
  Filled 2016-02-01: qty 15

## 2016-02-01 MED ORDER — ONDANSETRON HCL 4 MG/2ML IJ SOLN
INTRAMUSCULAR | Status: DC | PRN
  Administered 2016-02-01: 4 mg via INTRAVENOUS

## 2016-02-01 MED ORDER — SODIUM CHLORIDE 0.9 % IV SOLN
INTRAVENOUS | Status: DC
  Administered 2016-02-01 – 2016-02-02 (×2): via INTRAVENOUS

## 2016-02-01 MED ORDER — PHENYLEPHRINE HCL 10 MG/ML IJ SOLN
10.0000 mg | INTRAVENOUS | Status: DC | PRN
  Administered 2016-02-01: 10 ug/min via INTRAVENOUS

## 2016-02-01 MED ORDER — METHOCARBAMOL 500 MG PO TABS: 500.0000 mg | ORAL_TABLET | Freq: Four times a day (QID) | ORAL | Status: DC | PRN

## 2016-02-01 MED ORDER — SODIUM CHLORIDE 0.9 % IR SOLN
Status: DC | PRN
  Administered 2016-02-01: 1000 mL

## 2016-02-01 MED ORDER — IBUPROFEN 200 MG PO TABS
400.0000 mg | ORAL_TABLET | Freq: Four times a day (QID) | ORAL | Status: DC | PRN
  Administered 2016-02-02 – 2016-02-03 (×5): 400 mg via ORAL
  Filled 2016-02-01 (×5): qty 2

## 2016-02-01 MED ORDER — ASPIRIN 81 MG PO CHEW
81.0000 mg | CHEWABLE_TABLET | Freq: Every day | ORAL | Status: DC
  Administered 2016-02-02 – 2016-02-03 (×2): 81 mg via ORAL
  Filled 2016-02-01 (×2): qty 1

## 2016-02-01 MED ORDER — MENTHOL 3 MG MT LOZG: 1.0000 | LOZENGE | OROMUCOSAL | Status: DC | PRN

## 2016-02-01 MED ORDER — HYOSCYAMINE SULFATE 0.125 MG SL SUBL: 0.1250 mg | SUBLINGUAL_TABLET | SUBLINGUAL | Status: DC | PRN

## 2016-02-01 MED ORDER — PANTOPRAZOLE SODIUM 40 MG PO TBEC
40.0000 mg | DELAYED_RELEASE_TABLET | Freq: Every day | ORAL | Status: DC
  Administered 2016-02-02 – 2016-02-03 (×2): 40 mg via ORAL
  Filled 2016-02-01 (×2): qty 1

## 2016-02-01 MED ORDER — PHENOL 1.4 % MT LIQD: 1.0000 | OROMUCOSAL | Status: DC | PRN

## 2016-02-01 MED ORDER — ACETAMINOPHEN 325 MG PO TABS: 650.0000 mg | ORAL_TABLET | Freq: Four times a day (QID) | ORAL | Status: DC | PRN

## 2016-02-01 MED ORDER — LACTATED RINGERS IV SOLN
INTRAVENOUS | Status: DC | PRN
  Administered 2016-02-01 (×2): via INTRAVENOUS

## 2016-02-01 MED ORDER — ROCURONIUM BROMIDE 100 MG/10ML IV SOLN
INTRAVENOUS | Status: DC | PRN
  Administered 2016-02-01: 30 mg via INTRAVENOUS

## 2016-02-01 MED ORDER — PROPOFOL 10 MG/ML IV BOLUS
INTRAVENOUS | Status: DC | PRN
  Administered 2016-02-01: 120 mg via INTRAVENOUS

## 2016-02-01 MED ORDER — NAPHAZOLINE-GLYCERIN 0.012-0.2 % OP SOLN
1.0000 [drp] | Freq: Four times a day (QID) | OPHTHALMIC | Status: DC | PRN
  Filled 2016-02-01: qty 15

## 2016-02-01 MED ORDER — ESTROGENS, CONJUGATED 0.625 MG/GM VA CREA: 1.0000 | TOPICAL_CREAM | VAGINAL | Status: DC

## 2016-02-01 MED ORDER — ATORVASTATIN CALCIUM 40 MG PO TABS
40.0000 mg | ORAL_TABLET | Freq: Every day | ORAL | Status: DC
  Administered 2016-02-01 – 2016-02-02 (×2): 40 mg via ORAL
  Filled 2016-02-01 (×2): qty 1

## 2016-02-01 MED ORDER — ROCURONIUM BROMIDE 50 MG/5ML IV SOLN
INTRAVENOUS | Status: AC
  Filled 2016-02-01: qty 1

## 2016-02-01 MED ORDER — MEPERIDINE HCL 50 MG PO TABS: 50.0000 mg | ORAL_TABLET | ORAL | Status: DC | PRN

## 2016-02-01 MED ORDER — BUPIVACAINE-EPINEPHRINE (PF) 0.5% -1:200000 IJ SOLN
INTRAMUSCULAR | Status: DC | PRN
  Administered 2016-02-01: 30 mL

## 2016-02-01 MED ORDER — HYDROMORPHONE HCL 1 MG/ML IJ SOLN
0.2500 mg | INTRAMUSCULAR | Status: DC | PRN
  Administered 2016-02-02 – 2016-02-03 (×9): 0.25 mg via INTRAVENOUS
  Filled 2016-02-01 (×9): qty 1

## 2016-02-01 MED ORDER — PROMETHAZINE HCL 25 MG PO TABS
25.0000 mg | ORAL_TABLET | Freq: Two times a day (BID) | ORAL | Status: DC | PRN
  Administered 2016-02-01 – 2016-02-03 (×4): 25 mg via ORAL
  Filled 2016-02-01 (×6): qty 1

## 2016-02-01 MED ORDER — CEFAZOLIN SODIUM-DEXTROSE 2-4 GM/100ML-% IV SOLN
2.0000 g | Freq: Four times a day (QID) | INTRAVENOUS | Status: AC
  Administered 2016-02-01 – 2016-02-02 (×3): 2 g via INTRAVENOUS
  Filled 2016-02-01 (×3): qty 100

## 2016-02-01 MED ORDER — NEOSTIGMINE METHYLSULFATE 10 MG/10ML IV SOLN
INTRAVENOUS | Status: DC | PRN
  Administered 2016-02-01: 3 mg via INTRAVENOUS

## 2016-02-01 MED ORDER — ONDANSETRON HCL 4 MG/2ML IJ SOLN: 4.0000 mg | Freq: Four times a day (QID) | INTRAMUSCULAR | Status: DC | PRN

## 2016-02-01 MED ORDER — FENTANYL CITRATE (PF) 250 MCG/5ML IJ SOLN
INTRAMUSCULAR | Status: AC
  Filled 2016-02-01: qty 5

## 2016-02-01 MED ORDER — POLYETHYLENE GLYCOL 3350 17 G PO PACK: 17.0000 g | PACK | Freq: Every day | ORAL | Status: DC | PRN

## 2016-02-01 MED ORDER — CILIDINIUM-CHLORDIAZEPOXIDE 2.5-5 MG PO CAPS
1.0000 | ORAL_CAPSULE | Freq: Three times a day (TID) | ORAL | Status: DC | PRN
  Filled 2016-02-01: qty 1

## 2016-02-01 MED ORDER — LIDOCAINE HCL (CARDIAC) 20 MG/ML IV SOLN
INTRAVENOUS | Status: DC | PRN
  Administered 2016-02-01: 40 mg via INTRAVENOUS

## 2016-02-01 MED ORDER — CEFAZOLIN SODIUM-DEXTROSE 2-4 GM/100ML-% IV SOLN
INTRAVENOUS | Status: AC
  Administered 2016-02-01: 2 g via INTRAVENOUS
  Filled 2016-02-01: qty 100

## 2016-02-01 MED ORDER — PROMETHAZINE HCL 25 MG PO TABS: 25.0000 mg | ORAL_TABLET | Freq: Four times a day (QID) | ORAL | Status: DC | PRN

## 2016-02-01 MED ORDER — CEFAZOLIN SODIUM-DEXTROSE 2-4 GM/100ML-% IV SOLN: 2.0000 g | INTRAVENOUS | Status: DC

## 2016-02-01 MED ORDER — ACETAMINOPHEN 650 MG RE SUPP: 650.0000 mg | Freq: Four times a day (QID) | RECTAL | Status: DC | PRN

## 2016-02-01 MED ORDER — CARVEDILOL 12.5 MG PO TABS
12.5000 mg | ORAL_TABLET | Freq: Two times a day (BID) | ORAL | Status: DC
  Administered 2016-02-01 – 2016-02-03 (×4): 12.5 mg via ORAL
  Filled 2016-02-01 (×4): qty 1

## 2016-02-01 MED ORDER — PROPOFOL 10 MG/ML IV BOLUS
INTRAVENOUS | Status: AC
  Filled 2016-02-01: qty 20

## 2016-02-01 MED ORDER — METOCLOPRAMIDE HCL 5 MG PO TABS: 5.0000 mg | ORAL_TABLET | Freq: Three times a day (TID) | ORAL | Status: DC | PRN

## 2016-02-01 SURGICAL SUPPLY — 70 items
BIT DRILL 170X2.5X (BIT) IMPLANT
BIT DRILL 5/64X5 DISP (BIT) ×3 IMPLANT
BIT DRL 170X2.5X (BIT)
BLADE SAG 18X100X1.27 (BLADE) ×3 IMPLANT
CAPT SHLDR REVTOTAL 1 ×2 IMPLANT
CEMENT BONE DEPUY (Cement) ×2 IMPLANT
CLOSURE WOUND 1/2 X4 (GAUZE/BANDAGES/DRESSINGS) ×1
COVER SURGICAL LIGHT HANDLE (MISCELLANEOUS) ×3 IMPLANT
DRAPE IMP U-DRAPE 54X76 (DRAPES) ×6 IMPLANT
DRAPE INCISE IOBAN 66X45 STRL (DRAPES) ×3 IMPLANT
DRAPE ORTHO SPLIT 77X108 STRL (DRAPES) ×6
DRAPE SURG ORHT 6 SPLT 77X108 (DRAPES) ×2 IMPLANT
DRAPE U-SHAPE 47X51 STRL (DRAPES) ×3 IMPLANT
DRAPE X-RAY CASS 24X20 (DRAPES) IMPLANT
DRILL 2.5 (BIT)
DRSG ADAPTIC 3X8 NADH LF (GAUZE/BANDAGES/DRESSINGS) ×3 IMPLANT
DRSG PAD ABDOMINAL 8X10 ST (GAUZE/BANDAGES/DRESSINGS) ×3 IMPLANT
DURAPREP 26ML APPLICATOR (WOUND CARE) ×3 IMPLANT
ELECT BLADE 4.0 EZ CLEAN MEGAD (MISCELLANEOUS) ×3
ELECT NDL TIP 2.8 STRL (NEEDLE) ×1 IMPLANT
ELECT NEEDLE TIP 2.8 STRL (NEEDLE) ×3 IMPLANT
ELECT REM PT RETURN 9FT ADLT (ELECTROSURGICAL) ×3
ELECTRODE BLDE 4.0 EZ CLN MEGD (MISCELLANEOUS) ×1 IMPLANT
ELECTRODE REM PT RTRN 9FT ADLT (ELECTROSURGICAL) ×1 IMPLANT
GAUZE SPONGE 4X4 12PLY STRL (GAUZE/BANDAGES/DRESSINGS) ×3 IMPLANT
GLOVE BIOGEL PI ORTHO PRO 7.5 (GLOVE) ×2
GLOVE BIOGEL PI ORTHO PRO SZ8 (GLOVE) ×2
GLOVE ORTHO TXT STRL SZ7.5 (GLOVE) ×3 IMPLANT
GLOVE PI ORTHO PRO STRL 7.5 (GLOVE) ×1 IMPLANT
GLOVE PI ORTHO PRO STRL SZ8 (GLOVE) ×1 IMPLANT
GLOVE SURG ORTHO 8.5 STRL (GLOVE) ×3 IMPLANT
GOWN STRL REUS W/ TWL LRG LVL3 (GOWN DISPOSABLE) ×1 IMPLANT
GOWN STRL REUS W/ TWL XL LVL3 (GOWN DISPOSABLE) ×2 IMPLANT
GOWN STRL REUS W/TWL LRG LVL3 (GOWN DISPOSABLE) ×3
GOWN STRL REUS W/TWL XL LVL3 (GOWN DISPOSABLE) ×6
HANDPIECE INTERPULSE COAX TIP (DISPOSABLE)
KIT BASIN OR (CUSTOM PROCEDURE TRAY) ×3 IMPLANT
KIT ROOM TURNOVER OR (KITS) ×3 IMPLANT
MANIFOLD NEPTUNE II (INSTRUMENTS) ×3 IMPLANT
NDL 1/2 CIR MAYO (NEEDLE) ×1 IMPLANT
NDL HYPO 25GX1X1/2 BEV (NEEDLE) ×1 IMPLANT
NEEDLE 1/2 CIR MAYO (NEEDLE) ×3 IMPLANT
NEEDLE HYPO 25GX1X1/2 BEV (NEEDLE) ×3 IMPLANT
NS IRRIG 1000ML POUR BTL (IV SOLUTION) ×3 IMPLANT
PACK SHOULDER (CUSTOM PROCEDURE TRAY) ×3 IMPLANT
PAD ARMBOARD 7.5X6 YLW CONV (MISCELLANEOUS) ×6 IMPLANT
PIN GUIDE 1.2 (PIN) IMPLANT
PIN GUIDE GLENOPHERE 1.5MX300M (PIN) IMPLANT
PIN METAGLENE 2.5 (PIN) IMPLANT
SET HNDPC FAN SPRY TIP SCT (DISPOSABLE) IMPLANT
SLING ARM LRG ADULT FOAM STRAP (SOFTGOODS) IMPLANT
SLING ARM MED ADULT FOAM STRAP (SOFTGOODS) IMPLANT
SPONGE LAP 18X18 X RAY DECT (DISPOSABLE) IMPLANT
SPONGE LAP 4X18 X RAY DECT (DISPOSABLE) ×3 IMPLANT
STRIP CLOSURE SKIN 1/2X4 (GAUZE/BANDAGES/DRESSINGS) ×2 IMPLANT
SUCTION FRAZIER HANDLE 10FR (MISCELLANEOUS) ×2
SUCTION TUBE FRAZIER 10FR DISP (MISCELLANEOUS) ×1 IMPLANT
SUT FIBERWIRE #2 38 T-5 BLUE (SUTURE) ×6
SUT MNCRL AB 4-0 PS2 18 (SUTURE) ×3 IMPLANT
SUT VIC AB 2-0 CT1 27 (SUTURE) ×3
SUT VIC AB 2-0 CT1 TAPERPNT 27 (SUTURE) ×1 IMPLANT
SUT VICRYL 0 CT 1 36IN (SUTURE) ×3 IMPLANT
SUTURE FIBERWR #2 38 T-5 BLUE (SUTURE) ×2 IMPLANT
SYR CONTROL 10ML LL (SYRINGE) ×3 IMPLANT
TOWEL OR 17X24 6PK STRL BLUE (TOWEL DISPOSABLE) ×3 IMPLANT
TOWEL OR 17X26 10 PK STRL BLUE (TOWEL DISPOSABLE) ×3 IMPLANT
TOWER CARTRIDGE SMART MIX (DISPOSABLE) IMPLANT
TRAY FOLEY CATH 16FRSI W/METER (SET/KITS/TRAYS/PACK) IMPLANT
WATER STERILE IRR 1000ML POUR (IV SOLUTION) ×3 IMPLANT
YANKAUER SUCT BULB TIP NO VENT (SUCTIONS) ×3 IMPLANT

## 2016-02-01 NOTE — Anesthesia Preprocedure Evaluation (Signed)
Anesthesia Evaluation  Patient identified by MRN, date of birth, ID band Patient awake    Reviewed: Allergy & Precautions, NPO status , Patient's Chart, lab work & pertinent test results  History of Anesthesia Complications Negative for: history of anesthetic complications  Airway Mallampati: II  TM Distance: >3 FB Neck ROM: Full    Dental no notable dental hx. (+) Dental Advisory Given   Pulmonary asthma , COPD, former smoker,    Pulmonary exam normal breath sounds clear to auscultation       Cardiovascular hypertension, Pt. on medications Normal cardiovascular exam Rhythm:Regular Rate:Normal     Neuro/Psych negative neurological ROS  negative psych ROS   GI/Hepatic negative GI ROS, Neg liver ROS, GERD  Medicated and Controlled,  Endo/Other  Hypothyroidism   Renal/GU negative Renal ROS  negative genitourinary   Musculoskeletal  (+) Arthritis , Osteoarthritis,    Abdominal   Peds negative pediatric ROS (+)  Hematology negative hematology ROS (+)   Anesthesia Other Findings   Reproductive/Obstetrics negative OB ROS                             Anesthesia Physical Anesthesia Plan  ASA: II  Anesthesia Plan: General   Post-op Pain Management: GA combined w/ Regional for post-op pain   Induction: Intravenous  Airway Management Planned: Oral ETT  Additional Equipment:   Intra-op Plan:   Post-operative Plan: Extubation in OR  Informed Consent: I have reviewed the patients History and Physical, chart, labs and discussed the procedure including the risks, benefits and alternatives for the proposed anesthesia with the patient or authorized representative who has indicated his/her understanding and acceptance.   Dental advisory given  Plan Discussed with: CRNA  Anesthesia Plan Comments:         Anesthesia Quick Evaluation

## 2016-02-01 NOTE — Anesthesia Postprocedure Evaluation (Signed)
Anesthesia Post Note  Patient: Jenna [name]  Procedure(s) Performed: Procedure(s) (LRB): LEFT REVERSE TOTAL SHOULDER ARTHROPLASTY (Left)  Patient location during evaluation: PACU Anesthesia Type: General Level of consciousness: awake and alert Pain management: pain level controlled Vital Signs Assessment: post-procedure vital signs reviewed and stable Respiratory status: spontaneous breathing, nonlabored ventilation, respiratory function stable and patient connected to nasal cannula oxygen Cardiovascular status: blood pressure returned to baseline and stable Postop Assessment: no signs of nausea or vomiting Anesthetic complications: no    Last Vitals:  Filed Vitals:   02/01/16 0945 02/01/16 1000  BP: 113/36 120/50  Pulse: 70 66  Temp:    Resp: 11 16    Last Pain:  Filed Vitals:   02/01/16 1008  PainSc: 0-No pain                 Alixander Rallis JENNETTE

## 2016-02-01 NOTE — Discharge Instructions (Signed)
Ice to the shoulder as much as you can.  Ok to remove the sling and use the arm as you are able.  Keep the incision clean and dry and covered.  Ok to get wet in the shower in one week.  Follow up with Dr Veverly Fells in two weeks in the office.  Call [phone] for appt.

## 2016-02-01 NOTE — Progress Notes (Signed)
Utilization review completed.  

## 2016-02-01 NOTE — Interval H&P Note (Signed)
History and Physical Interval Note:  02/01/2016 7:13 AM  Jenna [name]  has presented today for surgery, with the diagnosis of LEFT SHOULDER ROTATOR CUFF ARTHROPATHY   The various methods of treatment have been discussed with the patient and family. After consideration of risks, benefits and other options for treatment, the patient has consented to  Procedure(s): LEFT REVERSE TOTAL SHOULDER ARTHROPLASTY (Left) as a surgical intervention .  The patient's history has been reviewed, patient examined, no change in status, stable for surgery.  I have reviewed the patient's chart and labs.  Questions were answered to the patient's satisfaction.     Cova Knieriem,STEVEN R

## 2016-02-01 NOTE — Brief Op Note (Signed)
02/01/2016  9:32 AM  PATIENT:  Jenna [name]  82 y.o. female  PRE-OPERATIVE DIAGNOSIS:  LEFT SHOULDER ROTATOR CUFF ARTHROPATHY   POST-OPERATIVE DIAGNOSIS:  LEFT SHOULDER ROTATOR CUFF ARTHROPATHY   PROCEDURE:  Procedure(s): LEFT REVERSE TOTAL SHOULDER ARTHROPLASTY (Left) DePuy Delta Xtend  SURGEON:  Surgeon(s) and Role:    * [name], MD - Primary  PHYSICIAN ASSISTANT:   ASSISTANTS: Ventura Bruns, PA-C   ANESTHESIA:   regional and general  EBL:  Total I/O In: 1000 [I.V.:1000] Out: -   BLOOD ADMINISTERED:none  DRAINS: none   LOCAL MEDICATIONS USED:  MARCAINE     SPECIMEN:  No Specimen  DISPOSITION OF SPECIMEN:  N/A  COUNTS:  YES  TOURNIQUET:  * No tourniquets in log *  DICTATION: .Other Dictation: Dictation Number [id number]  PLAN OF CARE: Admit to inpatient   PATIENT DISPOSITION:  PACU - hemodynamically stable.   Delay start of Pharmacological VTE agent (>24hrs) due to surgical blood loss or risk of bleeding: not applicable

## 2016-02-01 NOTE — Transfer of Care (Signed)
Immediate Anesthesia Transfer of Care Note  Patient: Jenna [name]  Procedure(s) Performed: Procedure(s): LEFT REVERSE TOTAL SHOULDER ARTHROPLASTY (Left)  Patient Location: PACU  Anesthesia Type:GA combined with regional for post-op pain  Level of Consciousness: awake, alert  and oriented  Airway & Oxygen Therapy: Patient Spontanous Breathing and Patient connected to nasal cannula oxygen  Post-op Assessment: Report given to RN and Post -op Vital signs reviewed and stable  Post vital signs: Reviewed and stable  Last Vitals:  Filed Vitals:   02/01/16 0611 02/01/16 0930  BP:    Pulse:    Temp: 36.7 C 36.4 C  Resp:      Complications: No apparent anesthesia complications

## 2016-02-01 NOTE — Anesthesia Procedure Notes (Addendum)
Procedure Name: Intubation Date/Time: 02/01/2016 7:33 AM Performed by: Mariea Clonts Pre-anesthesia Checklist: Emergency Drugs available, Patient identified, Timeout performed, Suction available and Patient being monitored Patient Re-evaluated:Patient Re-evaluated prior to inductionOxygen Delivery Method: Circle system utilized Preoxygenation: Pre-oxygenation with 100% oxygen Intubation Type: IV induction Ventilation: Mask ventilation without difficulty Laryngoscope Size: Miller and 2 Grade View: Grade I Tube size: 7.0 mm Number of attempts: 1 Placement Confirmation: ETT inserted through vocal cords under direct vision,  breath sounds checked- equal and bilateral and positive ETCO2 Tube secured with: Tape Dental Injury: Teeth and Oropharynx as per pre-operative assessment    Anesthesia Regional Block:  Interscalene brachial plexus block  Pre-Anesthetic Checklist: ,, timeout performed, Correct Patient, Correct Site, Correct Laterality, Correct Procedure, Correct Position, site marked, Risks and benefits discussed,  Surgical consent,  Pre-op evaluation,  At surgeon's request and post-op pain management  Laterality: Left  Prep: Maximum Sterile Barrier Precautions used and chloraprep       Needles:  Injection technique: Single-shot  Needle Type: Echogenic Stimulator Needle     Needle Length: 10cm 10 cm Needle Gauge: 21 and 21 G    Additional Needles:  Procedures: ultrasound guided (picture in chart) and nerve stimulator Interscalene brachial plexus block Narrative:  Injection made incrementally with aspirations every 5 mL.  Performed by: Personally  Anesthesiologist: Jamelle Noy, Stanton Kidney  Additional Notes: Patient tolerated the procedure well without complications

## 2016-02-02 LAB — BASIC METABOLIC PANEL
Anion gap: 8 (ref 5–15)
BUN: 7 mg/dL (ref 6–20)
CALCIUM: 8.1 mg/dL — AB (ref 8.9–10.3)
CO2: 23 mmol/L (ref 22–32)
CREATININE: 0.71 mg/dL (ref 0.44–1.00)
Chloride: 100 mmol/L — ABNORMAL LOW (ref 101–111)
GFR calc Af Amer: >60 mL/min (ref >60)
GLUCOSE: 128 mg/dL — AB (ref 65–99)
Potassium: 2.9 mmol/L — ABNORMAL LOW (ref 3.5–5.1)
SODIUM: 131 mmol/L — AB (ref 135–145)

## 2016-02-02 LAB — HEMOGLOBIN AND HEMATOCRIT, BLOOD
HCT: 31.5 % — ABNORMAL LOW (ref 36.0–46.0)
Hemoglobin: 10.6 g/dL — ABNORMAL LOW (ref 12.0–15.0)

## 2016-02-02 NOTE — Progress Notes (Signed)
Occupational Therapy Evaluation Patient Details Name: Jenna [name] MRN: [id number] DOB: 04-Apr-1933 Today's Date: 02/02/2016    History of Present Illness 82 y.o. female s/p L reverse total shoulder arthroplasty. PMH significant for HTN, dysrhythmia, swallowing dysfunction, hypercholesteremia, hypothyroidism, osteoarthritis, cervical and lumbar spinal stenosis, L rotator cuff repair (1981, 1996, 1998), R rotator cuff repair (1996, 1998), L THA (2001), R THA (2006), cervical fusion (2015).   Clinical Impression   PTA, pt was independent with ADLs and mobility. Pt limited by pain (8/10) this session. Pt currently requires min assist for ADLs and supervision for transfers and ambulation for safety. Educated pt on shoulder precautions, sling wear protocol, pillow positioning for bed/chair, and compensatory strategies for ADLs. Pt plans to d/.c home with 24/7 assistance from her husband. Pt will benefit from continued acute OT to increase independence and safety with ADLs and mobility to allow for safe discharge home. Will see pt for second session today to complete LUE HEP.    Follow Up Recommendations  Other (comment) (Progress shoulder rehab at MD discretion)    Equipment Recommendations  None recommended by OT    Recommendations for Other Services       Precautions / Restrictions Precautions Precautions: Shoulder Type of Shoulder Precautions: Active protocol Shoulder Interventions: Shoulder sling/immobilizer;Off for dressing/bathing/exercises Precaution Booklet Issued: Yes (comment) Precaution Comments:  AROM hand/wrist/elbow, AROM/PROM at shoulder - FF 140, ABD 60, ER 30 Required Braces or Orthoses: Sling Restrictions Weight Bearing Restrictions: Yes LUE Weight Bearing: Non weight bearing      Mobility Bed Mobility Overal bed mobility: Needs Assistance Bed Mobility: Supine to Sit     Supine to sit: Min assist     General bed mobility comments: HOB flat, no use of  bedrails, exited on R side to simulate home environment. Min assist for trunk support to come to sitting position.  Transfers Overall transfer level: Needs assistance Equipment used: None Transfers: Sit to/from Stand Sit to Stand: Supervision         General transfer comment: Supervision for safety. No LOB observed or reports of dizziness or lightheadedness    Balance Overall balance assessment: Needs assistance Sitting-balance support: No upper extremity supported;Feet supported Sitting balance-Leahy Scale: Good Sitting balance - Comments: some limitations due to pain in L shoulder   Standing balance support: No upper extremity supported;During functional activity Standing balance-Leahy Scale: Good Standing balance comment: some limitations due to pain in L shoulder                            ADL Overall ADL's : Needs assistance/impaired                         Toilet Transfer: Supervision/safety;Ambulation;Comfort height toilet   Toileting- Clothing Manipulation and Hygiene: Minimal assistance;Sit to/from stand               Vision Vision Assessment?: No apparent visual deficits   Perception     Praxis      Pertinent Vitals/Pain Pain Assessment: 0-10 Pain Score: 8  Pain Location: L shoulder Pain Descriptors / Indicators: Aching;Sore Pain Intervention(s): Limited activity within patient's tolerance;Monitored during session;Repositioned;RN gave pain meds during session     Hand Dominance Right   Extremity/Trunk Assessment Upper Extremity Assessment Upper Extremity Assessment: LUE deficits/detail LUE Deficits / Details: decreased ROM and strength as expected post op LUE: Unable to fully assess due to pain;Unable to fully assess due to  immobilization   Lower Extremity Assessment Lower Extremity Assessment: Overall WFL for tasks assessed   Cervical / Trunk Assessment Cervical / Trunk Assessment: Normal   Communication  Communication Communication: No difficulties   Cognition Arousal/Alertness: Awake/alert Behavior During Therapy: WFL for tasks assessed/performed Overall Cognitive Status: Within Functional Limits for tasks assessed                     General Comments       Exercises Exercises: Shoulder     Shoulder Instructions Shoulder Instructions Donning/doffing shirt without moving shoulder: Moderate assistance Method for sponge bathing under operated UE: Moderate assistance Donning/doffing sling/immobilizer: Moderate assistance Correct positioning of sling/immobilizer: Supervision/safety ROM for elbow, wrist and digits of operated UE: Supervision/safety Sling wearing schedule (on at all times/off for ADL's): Supervision/safety Proper positioning of operated UE when showering: Supervision/safety Positioning of UE while sleeping: [place] expects to be discharged to:: Private residence Living Arrangements: Spouse/significant other Available Help at Discharge: Family;Available 24 hours/day Type of Home: House             Bathroom Shower/Tub: Walk-in shower;Door;Tub/shower unit Shower/tub characteristics: Curtain Bathroom Toilet: Handicapped height     Home Equipment: Environmental consultant - 2 wheels;Tub bench;Hand held shower head;Bedside commode          Prior Functioning/Environment Level of Independence: Independent             OT Diagnosis: Acute pain   OT Problem List: Decreased strength;Decreased range of motion;Decreased activity tolerance;Impaired balance (sitting and/or standing);Decreased coordination;Decreased safety awareness;Decreased knowledge of use of DME or AE;Decreased knowledge of precautions;Pain;Impaired UE functional use   OT Treatment/Interventions: Self-care/ADL training;Therapeutic exercise;Energy conservation;DME and/or AE instruction;Therapeutic activities;Patient/family education;Balance training    OT  Goals(Current goals can be found in the care plan section) Acute Rehab OT Goals Patient Stated Goal: to go home today OT Goal Formulation: With patient Time For Goal Achievement: 02/16/16 Potential to Achieve Goals: Good ADL Goals Pt Will Perform Upper Body Bathing: with min assist;with caregiver independent in assisting;sitting Pt Will Perform Upper Body Dressing: with min assist;sitting;with caregiver independent in assisting Pt Will Perform Lower Body Dressing: with min assist;with caregiver independent in assisting;sit to/from stand Pt Will Transfer to Toilet: with modified independence;regular height toilet Pt Will Perform Toileting - Clothing Manipulation and hygiene: with modified independence;sitting/lateral leans;sit to/from stand Pt Will Perform Tub/Shower Transfer: Tub transfer;with supervision;with caregiver independent in assisting;ambulating;tub bench Pt/caregiver will Perform Home Exercise Program: Increased ROM;Independently;With written HEP provided;Left upper extremity  OT Frequency: Min 3X/week   Barriers to D/C:            Co-evaluation              End of Session Equipment Utilized During Treatment: Gait belt;Other (comment) (sling) Nurse Communication: Mobility status;Other (comment) (Pt will need second session today)  Activity Tolerance: Patient tolerated treatment well Patient left: in chair;with call bell/phone within reach;with family/visitor present   Time: [id number] OT Time Calculation (min): 45 min Charges:  OT General Charges $OT Visit: 1 Procedure OT Evaluation $OT Eval Moderate Complexity: 1 Procedure OT Treatments $Self Care/Home Management : 23-37 mins G-Codes:    Redmond Baseman, OTR/L Pager: [phone] 02/02/2016, 10:57 AM

## 2016-02-02 NOTE — Progress Notes (Signed)
Occupational Therapy Treatment Patient Details Name: Jenna [name] MRN: [id number] DOB: 18-Sep-1933 Today's Date: 02/02/2016    History of present illness 82 y.o. female s/p L reverse total shoulder arthroplasty. PMH significant for HTN, dysrhythmia, swallowing dysfunction, hypercholesteremia, hypothyroidism, osteoarthritis, cervical and lumbar spinal stenosis, L rotator cuff repair (1981, 1996, 1998), R rotator cuff repair (1996, 1998), L THA (2001), R THA (2006), cervical fusion (2015).   OT comments  Pt progressing well, but is still limited by pain (7/10). Educated and practiced ROM exercises for LUE. Will continue to follow acutely to address OT needs and goals.   Follow Up Recommendations  Other (comment) (Progress shoulder rehab at MD discretion)    Equipment Recommendations  None recommended by OT    Recommendations for Other Services      Precautions / Restrictions Precautions Precautions: Shoulder Type of Shoulder Precautions: Active protocol Shoulder Interventions: Shoulder sling/immobilizer;Off for dressing/bathing/exercises Precaution Booklet Issued: Yes (comment) Precaution Comments:  AROM hand/wrist/elbow, AROM/PROM at shoulder - FF 140, ABD 60, ER 30 Required Braces or Orthoses: Sling Restrictions Weight Bearing Restrictions: Yes LUE Weight Bearing: Non weight bearing       Mobility Bed Mobility Overal bed mobility: Needs Assistance Bed Mobility: Supine to Sit     Supine to sit: Min assist     General bed mobility comments: Pt up on chair on OT arrival  Transfers Overall transfer level: Needs assistance Equipment used: None Transfers: Sit to/from Stand Sit to Stand: Supervision         General transfer comment: Focus of session on HEP    Balance Overall balance assessment: Needs assistance Sitting-balance support: No upper extremity supported;Feet supported Sitting balance-Leahy Scale: Good Sitting balance - Comments: some limitations due  to pain in L shoulder   Standing balance support: No upper extremity supported;During functional activity Standing balance-Leahy Scale: Good Standing balance comment: some limitations due to pain in L shoulder                   ADL Overall ADL's : Needs assistance/impaired                 Upper Body Dressing : Moderate assistance;With caregiver independent assisting;Sitting;Cueing for UE precautions Upper Body Dressing Details (indicate cue type and reason): to don/doff sling     Toilet Transfer: Supervision/safety;Ambulation;Comfort height toilet   Toileting- Clothing Manipulation and Hygiene: Minimal assistance;Sit to/from stand         General ADL Comments: Focus of session on HEP      Vision                     Perception     Praxis      Cognition   Behavior During Therapy: [facility] for tasks assessed/performed Overall Cognitive Status: Within Functional Limits for tasks assessed                       Extremity/Trunk Assessment  Upper Extremity Assessment Upper Extremity Assessment: LUE deficits/detail LUE Deficits / Details: decreased ROM and strength as expected post op LUE: Unable to fully assess due to pain;Unable to fully assess due to immobilization   Lower Extremity Assessment Lower Extremity Assessment: Overall WFL for tasks assessed   Cervical / Trunk Assessment Cervical / Trunk Assessment: Normal    Exercises Shoulder Exercises Shoulder Flexion: PROM;AROM;Left;10 reps;Supine;Seated Shoulder ABduction: PROM;AROM;Left;10 reps;Supine;Seated Shoulder External Rotation: PROM;AROM;Left;10 reps;Supine;Seated Elbow Flexion: AROM;Left;10 reps;Supine;Seated Elbow Extension: AROM;Left;10 reps;Supine;Seated Wrist Flexion: AROM;Left;10 reps;Supine;Seated Wrist Extension:  AROM;Left;10 reps;Supine;Seated Digit Composite Flexion: AROM;Left;10 reps;Supine;Seated Composite Extension: AROM;Left;10 reps;Supine;Seated Donning/doffing  shirt without moving shoulder: Moderate assistance Method for sponge bathing under operated UE: Moderate assistance Donning/doffing sling/immobilizer: Moderate assistance;Caregiver independent with task Correct positioning of sling/immobilizer: Supervision/safety ROM for elbow, wrist and digits of operated UE: Supervision/safety Sling wearing schedule (on at all times/off for ADL's): Supervision/safety Proper positioning of operated UE when showering: Supervision/safety Positioning of UE while sleeping: Supervision/safety   Shoulder Instructions Shoulder Instructions Donning/doffing shirt without moving shoulder: Moderate assistance Method for sponge bathing under operated UE: Moderate assistance Donning/doffing sling/immobilizer: Moderate assistance;Caregiver independent with task Correct positioning of sling/immobilizer: Supervision/safety ROM for elbow, wrist and digits of operated UE: Supervision/safety Sling wearing schedule (on at all times/off for ADL's): Supervision/safety Proper positioning of operated UE when showering: Supervision/safety Positioning of UE while sleeping: Supervision/safety     General Comments      Pertinent Vitals/ Pain       Pain Assessment: 0-10 Pain Score: 7  Pain Location: L shoulder Pain Descriptors / Indicators: Aching;Sore Pain Intervention(s): Limited activity within patient's tolerance;Monitored during session;Repositioned;Ice applied;Premedicated before session  [place] expects to be discharged to:: Private residence Living Arrangements: Spouse/significant other Available Help at Discharge: Family;Available 24 hours/day Type of Home: House             Bathroom Shower/Tub: Walk-in shower;Door;Tub/shower unit Shower/tub characteristics: Curtain Bathroom Toilet: Handicapped height     Home Equipment: Environmental consultant - 2 wheels;Tub bench;Hand held shower head;Bedside commode          Prior Functioning/Environment Level of  Independence: Independent            Frequency Min 3X/week     Progress Toward Goals  OT Goals(current goals can now be found in the care plan section)  Progress towards OT goals: Progressing toward goals  Acute Rehab OT Goals Patient Stated Goal: to go home tomorrow OT Goal Formulation: With patient Time For Goal Achievement: 02/16/16 Potential to Achieve Goals: Good ADL Goals Pt Will Perform Upper Body Bathing: with min assist;with caregiver independent in assisting;sitting Pt Will Perform Upper Body Dressing: with min assist;sitting;with caregiver independent in assisting Pt Will Perform Lower Body Dressing: with min assist;with caregiver independent in assisting;sit to/from stand Pt Will Transfer to Toilet: with modified independence;regular height toilet Pt Will Perform Toileting - Clothing Manipulation and hygiene: with modified independence;sitting/lateral leans;sit to/from stand Pt Will Perform Tub/Shower Transfer: Tub transfer;with supervision;with caregiver independent in assisting;ambulating;tub bench Pt/caregiver will Perform Home Exercise Program: Increased ROM;Independently;With written HEP provided;Left upper extremity  Plan Discharge plan remains appropriate    Co-evaluation                 End of Session Equipment Utilized During Treatment: Other (comment) (sling)   Activity Tolerance Patient tolerated treatment well   Patient Left in chair;with call bell/phone within reach;with family/visitor present   Nurse Communication Mobility status        Time: [id number] OT Time Calculation (min): 24 min  Charges: OT General Charges $OT Visit: 1 Procedure OT Evaluation $OT Eval Moderate Complexity: 1 Procedure OT Treatments $Self Care/Home Management : 23-37 mins  Redmond Baseman, OTR/L Pager: [phone] 02/02/2016, 12:35 PM

## 2016-02-02 NOTE — Discharge Summary (Signed)
Physician Discharge Summary  Patient ID: Jenna [name] MRN: [id number] DOB/AGE: 1933-06-11 82 y.o.  Admit date: 02/01/2016 Discharge date: 02/03/2016  Admission Diagnoses: Left reverse shoulder replacement; chronic post-op pain; spondylosis of cervical joint  Discharge Diagnoses:  Active Problems:   S/P shoulder replacement same as above  Discharged Condition: stable  Hospital Course: Patient presented to [facility] on 02/01/16 for elective L reverse total shoulder replacement by Dr. Veverly Fells.  The patient tolerated the procedure well without complication.  The patient was then admitted to the hospital.  The patient tolerated her stay without complication and will be D/C'd home on 02/03/16.  Consults: PT/OT  Significant Diagnostic Studies: labs: cbc, bmet,  Treatments: IV hydration, antibiotics: Ancef, analgesia: Dilaudid and demerol, cardiac meds: carvedilol and losartan and surgery: as stated above  Discharge Exam: Blood pressure 115/42, pulse 80, temperature 97.7 F (36.5 C), temperature source Oral, resp. rate 16, height 5' 2.5" (1.588 m), weight 68.176 kg (150 lb 4.8 oz), last menstrual period 11/03/1980, SpO2 92 %.  General: WDWN patient in NAD. Psych:  Appropriate mood and affect. Neuro:  A&O x 3, Moving all extremities, sensation intact to light touch HEENT:  EOMs intact Chest:  Even non-labored respirations Skin:  Dressing C/D/I, no rashes or lesions Extremities: warm/dry, mild edema, no erythema or echymosis.  No lymphadenopathy. Pulses: Radial 2+ MSK:  ROM: full wrist ROM, MMT: 5/5 grip strength   Disposition: 01-Home or Self Care  Discharge Instructions    Call MD / Call 911    Complete by:  As directed   If you experience chest pain or shortness of breath, CALL 911 and be transported to the hospital emergency room.  If you develope a fever above 101 F, pus (white drainage) or increased drainage or redness at the wound, or calf pain, call your surgeon's office.     Constipation Prevention    Complete by:  As directed   Drink plenty of fluids.  Prune juice may be helpful.  You may use a stool softener, such as Colace (over the counter) 100 mg twice a day.  Use MiraLax (over the counter) for constipation as needed.     Diet - low sodium heart healthy    Complete by:  As directed      Increase activity slowly as tolerated    Complete by:  As directed      Non weight bearing    Complete by:  As directed   Laterality:  left  Extremity:  Upper            Medication List    TAKE these medications        aspirin 81 MG tablet  Take 81 mg by mouth daily.     atorvastatin 80 MG tablet  Commonly known as:  LIPITOR  Take 40 mg by mouth daily.     calcium carbonate 600 MG Tabs tablet  Commonly known as:  OS-CAL  Take 600 mg by mouth 2 (two) times daily.     carvedilol 12.5 MG tablet  Commonly known as:  COREG  Take 12.5 mg by mouth 2 (two) times daily with a meal.     cholecalciferol 1000 units tablet  Commonly known as:  VITAMIN D  Take 1,000 Units by mouth daily.     clidinium-chlordiazePOXIDE 5-2.5 MG capsule  Commonly known as:  LIBRAX  Take 1 capsule by mouth 3 (three) times daily as needed (for IBS).     conjugated estrogens vaginal cream  Commonly known as:  PREMARIN  Use 1/2 g vaginally twice weekly     diazepam 5 MG tablet  Commonly known as:  VALIUM  Take 5 mg by mouth at bedtime as needed for sedation.     estradiol 0.025 MG/24HR  Commonly known as:  VIVELLE-DOT  Use 1 patch twice weekly     fexofenadine 180 MG tablet  Commonly known as:  ALLEGRA  Take 180 mg by mouth daily.     fluconazole 150 MG tablet  Commonly known as:  DIFLUCAN  Take 1 tablet (150 mg total) by mouth once. Repeat in 72 hours if symptoms persist.     fluticasone 50 MCG/ACT nasal spray  Commonly known as:  FLONASE  Place 2 sprays into both nostrils daily as needed for allergies.     hydrochlorothiazide 12.5 MG capsule  Commonly known as:   MICROZIDE  Take 1 capsule (12.5 mg total) by mouth daily.     hyoscyamine 0.125 MG SL tablet  Commonly known as:  LEVSIN SL  Place 0.125-0.25 mg under the tongue every 4 (four) hours as needed for cramping.     ibuprofen 200 MG tablet  Commonly known as:  ADVIL,MOTRIN  Take 400 mg by mouth every 6 (six) hours as needed (for pain).     levothyroxine 112 MCG tablet  Commonly known as:  SYNTHROID, LEVOTHROID  Take 112 mcg by mouth daily before breakfast.     losartan 100 MG tablet  Commonly known as:  COZAAR  Take 1 tablet (100 mg total) by mouth daily.     meperidine 50 MG tablet  Commonly known as:  DEMEROL  Take 1 tablet (50 mg total) by mouth 3 (three) times daily.     meperidine 50 MG tablet  Commonly known as:  DEMEROL  Take 1 tablet (50 mg total) by mouth every 4 (four) hours as needed for severe pain.     methocarbamol 500 MG tablet  Commonly known as:  ROBAXIN  Take 1 tablet (500 mg total) by mouth every 6 (six) hours as needed for muscle spasms.     naphazoline 0.1 % ophthalmic solution  Commonly known as:  NAPHCON  Place 1 drop into both eyes daily as needed for irritation.     omeprazole 40 MG capsule  Commonly known as:  PRILOSEC  Take 40 mg by mouth 2 (two) times daily.     promethazine 25 MG tablet  Commonly known as:  PHENERGAN  TAKE 1 TABLET BY MOUTH TWICE DAILY AS NEEDED.     promethazine 25 MG tablet  Commonly known as:  PHENERGAN  Take 1 tablet (25 mg total) by mouth every 6 (six) hours as needed for nausea or vomiting.           Follow-up Information    Follow up with NORRIS,STEVEN R, MD. Call in 2 weeks.   Specialty:  Orthopedic Surgery   Why:  [phone]   Contact information:   [address] [place] [address] [id number]       Signed: Mechele Claude, Hershal Coria, [facility] Office:  [phone]

## 2016-02-02 NOTE — Progress Notes (Signed)
Nurse paged me after rounding today, and informed me that the patient does not feel that her pain is controlled enough to go home today.  I cancelled the D/C order and we'll plan on her to go home tomorrow, pain permitting.  I have completed and updated the discharge summary for her, with the intent that she be D/C/d tomorrow.  Mechele Claude, PA-C, [employer] Office:  [phone]

## 2016-02-02 NOTE — Op Note (Signed)
Jenna [name], Jenna [name]              ACCOUNT NO.:  [id number]  MEDICAL RECORD NO.:  [id number]  LOCATION:  [id number]                        FACILITY:  [facility]  PHYSICIAN:  Doran Heater. Veverly Fells, M.D. DATE OF BIRTH:  1933/01/16  DATE OF PROCEDURE:  02/01/2016 DATE OF DISCHARGE:                              OPERATIVE REPORT   PREOPERATIVE DIAGNOSIS:  Left shoulder rotator cuff tear arthropathy.  POSTOPERATIVE DIAGNOSIS:  Left shoulder rotator cuff tear arthropathy.  PROCEDURE PERFORMED:  Left reverse total shoulder arthroplasty using DePuy Delta Xtend prosthesis.  ATTENDING SURGEON:  Doran Heater. Veverly Fells, M.D.  ASSISTANT:  Abbott Pao. Dixon, PA-C, who was scrubbed the entire procedure and necessary for satisfactory completion of surgery.  ANESTHESIA:  General anesthesia was used plus interscalene block.  ESTIMATED BLOOD LOSS:  100 mL.  FLUID REPLACEMENT:  1200 mL crystalloid.  INSTRUMENT COUNTS:  Correct.  COMPLICATIONS:  There were no complications.  ANTIBIOTICS:  Perioperative antibiotics were given.  INDICATIONS:  The patient is an 82 year old female, who presents with worsening left shoulder pain secondary to rotator cuff tear arthropathy. The patient has had a history of prior rotator cuff surgery remotely. She has had progressive pain and superior head migration over the last several years to the point were she has required corticosteroid injections to control pain.  Function is progressively declined to the point where she has a pseudoparalysis present in the left shoulder now. Given progressive functional decline as well as progressive pain despite injections, modification, activity pain medication, she presents now for operative replacement with reverse shoulder replacement.  Risks and benefits of the surgery discussed, informed consent obtained.  DESCRIPTION OF PROCEDURE:  After an adequate level of anesthesia was achieved, the patient was positioned in the modified  beach-chair position.  Left shoulder was correctly identified, sterilely prepped and draped in usual manner.  Time-out was called.  We entered the shoulder using standard deltopectoral incision started at the coracoid process extending down to the anterior humerus, dissection down through the subcutaneous tissues, identified the cephalic vein, took it laterally with the deltoid, pectoralis taken medially.  Conjoint tendon identified and retracted medially.  The humeral head was delivered out of the wound.  The subscapularis and inferior capsule released facilitating exposure and we released the remaining scarred and torn rotator cuff back to the teres minor, which we preserved.  We did remove that scarred rotator cuff tissue with the suture.  Next, we went ahead and entered the proximal humerus with a 6-mm reamer, we reamed up to size 12.  We then resected our head with 10-degree retroversion using our head resection guide, which was intramedullary.  We then removed the excess osteophytes off the medial humerus and then reamed for the epi-1 left proximal metaphyseal reaming to prepare the humerus for insertion of the humeral prosthesis.  We then selected the size 12 body with the epi-1 left, set on 0 setting and placed in 10 degrees of retroversion and impacted that in position and fit nicely, providing good proximal coverage.  We then reduced the humerus posteriorly, did a 360-degree glenoid labrum removal and capsule removal carefully to protect the axillary nerve.  We then found our center point of the glenoid  face and drilled that with a guidepin and that was slightly inferiorly and then reamed for the metaglene and drilled our central peg hole and impacted the metaglene in position, secured with two lock screws 48 inferiorly, 24 proximally and then 18 nonlocked posterior and anteriorly.  We placed our metaglene in position and screwed that home impacting during the screwing process  and make sure it was seated.  Once that was firmly seated that was a 38 standard, it gave nice coverage inferiorly, so there would be no impingement.  We then reduced the shoulder with a 38+ 3.  We felt like we could definitely get the +6 in.  We removed the trial components from the humerus, irrigated thoroughly and then dried the canal.  We then used hand packed DePuy 1 cement distally with a size 12 HA stem and an epi-1 left HA proximal metaphyseal portions, set on 0 setting and we hand packed the cement down the canal and thus this was a hybrid system, used the HA proximally and impacted that, seated it nicely in 10 degrees of retroversion.  We then reduced the shoulder with a 38+ 6 real poly insert and that fit nicely.  We had excellent stability, nice tight conjoined, not overtightened and inferior sulcus with no gapping and then external rotation with no gapping.  Excellent range of motion, no tethering or impingement.  We removed the remaining subscapularis, which was not repairable and then irrigated thoroughly and closed the deltopectoral interval with 0 Vicryl suture followed by 2- 0 Vicryl subcutaneous closure and 4-0 Monocryl for skin closure for a subcuticular stitch.  Steri-Strips applied followed by a sterile dressing.  The patient tolerated the surgery well.     Doran Heater. Veverly Fells, M.D.     SRN/MEDQ  D:  02/01/2016  T:  02/02/2016  Job:  [id number]

## 2016-02-02 NOTE — Progress Notes (Signed)
Subjective: 1 Day Post-Op Procedure(s) (LRB): LEFT REVERSE TOTAL SHOULDER ARTHROPLASTY (Left)  Patient reports pain as moderate.  Patient reports that she would like to go home, as she feels she can manage her pain.  Tolerating POs well.  Denies BM, however admits to flatulence.  Denies fever, chills, N/V.  Objective:   VITALS:  Temp:  [97.7 F (36.5 C)-99.4 F (37.4 C)] 97.7 F (36.5 C) (04/01 0500) Pulse Rate:  [66-100] 80 (04/01 0910) Resp:  [16-20] 16 (04/01 0500) BP: (105-135)/(42-58) 115/42 mmHg (04/01 0910) SpO2:  [90 %-100 %] 92 % (04/01 0500)  General: WDWN patient in NAD. Psych:  Appropriate mood and affect. Neuro:  A&O x 3, Moving all extremities, sensation intact to light touch HEENT:  EOMs intact Chest:  Even non-labored respirations Skin:  Dressing C/D/I, no rashes or lesions Extremities: warm/dry, mild edema, no erythema or echymosis.  No lymphadenopathy. Pulses: Radial 2+ MSK:  ROM: full wrist ROM, MMT: 5/5 grip strength,    LABS  Recent Labs  02/01/16 0601 02/02/16 0446  HGB 12.6 10.6*  WBC 6.9  --   PLT 307  --     Recent Labs  02/01/16 0601 02/02/16 0446  NA 134* 131*  K 3.5 2.9*  CL 101 100*  CO2 25 23  BUN 9 7  CREATININE 0.71 0.71  GLUCOSE 95 128*   No results for input(s): LABPT, INR in the last 72 hours.   Assessment/Plan: 1 Day Post-Op Procedure(s) (LRB): LEFT REVERSE TOTAL SHOULDER ARTHROPLASTY (Left)  Up with therapy D/C IV fluids  Plan for D/C home today.  Order has been placed.  If patient changes her mind and feels as though she cannot manage her pain the nurse can call and we will cancel to D/C order. Plan for outpatient post-op visit with Dr. Veverly Fells.  Mechele Claude, PA-C, [employer] Office:  [phone]

## 2016-02-03 NOTE — Progress Notes (Signed)
Occupational Therapy Treatment/Discharge Patient Details Name: Jenna [name] MRN: [id number] DOB: 08-24-1933 Today's Date: 02/03/2016    History of present illness 82 y.o. female s/p L reverse total shoulder arthroplasty. PMH significant for HTN, dysrhythmia, swallowing dysfunction, hypercholesteremia, hypothyroidism, osteoarthritis, cervical and lumbar spinal stenosis, L rotator cuff repair (1981, 1996, 1998), R rotator cuff repair (1996, 1998), L THA (2001), R THA (2006), cervical fusion (2015).   OT comments  Pt completed all ROM exercises with min verbal cues for proper technique. Practiced UB bathing and dressing with pt and pt's husband assisting. Pt has completed all occupational therapy goals and is adequate for discharge. All education has been completed and pt has no further questions. OT signing off.   Follow Up Recommendations  Other (comment) (Progress shoulder rehab at MD discretion)    Equipment Recommendations  None recommended by OT    Recommendations for Other Services      Precautions / Restrictions Precautions Precautions: Shoulder Type of Shoulder Precautions: Active protocol Shoulder Interventions: Shoulder sling/immobilizer;Off for dressing/bathing/exercises Precaution Booklet Issued: No Precaution Comments:  AROM hand/wrist/elbow, AROM/PROM at shoulder - FF 140, ABD 60, ER 30 Required Braces or Orthoses: Sling Restrictions Weight Bearing Restrictions: Yes LUE Weight Bearing: Non weight bearing       Mobility Bed Mobility Overal bed mobility: Modified Independent Bed Mobility: Supine to Sit     Supine to sit: Modified independent (Device/Increase time)        Transfers Overall transfer level: Needs assistance Equipment used: None Transfers: Sit to/from Stand Sit to Stand: Supervision              Balance Overall balance assessment: Needs assistance Sitting-balance support: No upper extremity supported;Feet supported Sitting  balance-Leahy Scale: Good     Standing balance support: No upper extremity supported;During functional activity Standing balance-Leahy Scale: Good                     ADL Overall ADL's : Needs assistance/impaired         Upper Body Bathing: Minimal assitance;With caregiver independent assisting;Sitting   Lower Body Bathing: Minimal assistance;With caregiver independent assisting;Sit to/from stand   Upper Body Dressing : Moderate assistance;With caregiver independent assisting;Sitting   Lower Body Dressing: Minimal assistance;With caregiver independent assisting;Sit to/from stand   Toilet Transfer: Supervision/safety;Ambulation;Comfort height toilet   Toileting- Clothing Manipulation and Hygiene: Minimal assistance;Sit to/from stand Toileting - Clothing Manipulation Details (indicate cue type and reason): to don/doff clothing     Functional mobility during ADLs: Supervision/safety General ADL Comments: Reviewed all shoulder precautions, practiced dressing and complete HEP      Vision                     Perception     Praxis      Cognition   Behavior During Therapy: [facility] for tasks assessed/performed Overall Cognitive Status: Within Functional Limits for tasks assessed                       Extremity/Trunk Assessment               Exercises Shoulder Exercises Shoulder Flexion: PROM;AROM;Left;10 reps;Supine;Seated Shoulder ABduction: PROM;AROM;Left;10 reps;Supine;Seated Shoulder External Rotation: PROM;AROM;Left;10 reps;Supine;Seated Elbow Flexion: AROM;Left;10 reps;Supine;Seated Elbow Extension: AROM;Left;10 reps;Supine;Seated Wrist Flexion: AROM;Left;10 reps;Supine;Seated Wrist Extension: AROM;Left;10 reps;Supine;Seated Digit Composite Flexion: AROM;Left;10 reps;Supine;Seated Composite Extension: AROM;Left;10 reps;Supine;Seated Donning/doffing shirt without moving shoulder: Moderate assistance;Caregiver independent with task;Patient  able to independently direct caregiver Method for sponge bathing under operated  UE: Minimal assistance;Caregiver independent with task;Patient able to independently direct caregiver Donning/doffing sling/immobilizer: Moderate assistance;Patient able to independently direct caregiver;Caregiver independent with task Correct positioning of sling/immobilizer: Modified independent ROM for elbow, wrist and digits of operated UE: Modified independent Sling wearing schedule (on at all times/off for ADL's): Modified independent Proper positioning of operated UE when showering: Modified independent Positioning of UE while sleeping: Modified independent   Shoulder Instructions Shoulder Instructions Donning/doffing shirt without moving shoulder: Moderate assistance;Caregiver independent with task;Patient able to independently direct caregiver Method for sponge bathing under operated UE: Minimal assistance;Caregiver independent with task;Patient able to independently direct caregiver Donning/doffing sling/immobilizer: Moderate assistance;Patient able to independently direct caregiver;Caregiver independent with task Correct positioning of sling/immobilizer: Modified independent ROM for elbow, wrist and digits of operated UE: Modified independent Sling wearing schedule (on at all times/off for ADL's): Modified independent Proper positioning of operated UE when showering: Modified independent Positioning of UE while sleeping: Modified independent     General Comments      Pertinent Vitals/ Pain       Pain Assessment: 0-10 Pain Score: 6  Pain Location: L shoulder Pain Descriptors / Indicators: Aching;Sore Pain Intervention(s): Limited activity within patient's tolerance;Monitored during session;Repositioned;Premedicated before session;Ice applied  Home Living                                          Prior Functioning/Environment              Frequency       Progress Toward  Goals  OT Goals(current goals can now be found in the care plan section)  Progress towards OT goals: Goals met/education completed, patient discharged from OT  Acute Rehab OT Goals Patient Stated Goal: to go home today OT Goal Formulation: With patient Time For Goal Achievement: 02/16/16 Potential to Achieve Goals: Good ADL Goals Pt Will Perform Upper Body Bathing: with min assist;with caregiver independent in assisting;sitting Pt Will Perform Upper Body Dressing: with min assist;sitting;with caregiver independent in assisting Pt Will Perform Lower Body Dressing: with min assist;with caregiver independent in assisting;sit to/from stand Pt Will Transfer to Toilet: with modified independence;regular height toilet Pt Will Perform Toileting - Clothing Manipulation and hygiene: with modified independence;sitting/lateral leans;sit to/from stand Pt Will Perform Tub/Shower Transfer: Tub transfer;with supervision;with caregiver independent in assisting;ambulating;tub bench Pt/caregiver will Perform Home Exercise Program: Increased ROM;Independently;With written HEP provided;Left upper extremity  Plan All goals met and education completed, patient discharged from OT services    Co-evaluation                 End of Session Equipment Utilized During Treatment: Other (comment) (sling)   Activity Tolerance Patient tolerated treatment well   Patient Left in chair;with call bell/phone within reach;with family/visitor present   Nurse Communication Mobility status        Time: 1010-1038 OT Time Calculation (min): 28 min  Charges: OT General Charges $OT Visit: 1 Procedure OT Treatments $Self Care/Home Management : 23-37 mins  Redmond Baseman, OTR/L Pager: [phone] 02/03/2016, 12:45 PM

## 2016-02-03 NOTE — Progress Notes (Signed)
   Subjective:  Patient reports pain as mild.  Wants to go home.  Objective:   VITALS:   Filed Vitals:   02/02/16 1739 02/02/16 2131 02/03/16 0543 02/03/16 0833  BP: 118/53 106/41 122/48 133/81  Pulse: 85 82 75 88  Temp:  98.6 F (37 C) 97.7 F (36.5 C)   TempSrc:  Oral Oral   Resp:  16 16   Height:      Weight:      SpO2:  95% 96%     ABD soft Incision: dressing C/D/I + AIN/PIN/U. SILT. 2+ radial   Lab Results  Component Value Date   WBC 6.9 02/01/2016   HGB 10.6* 02/02/2016   HCT 31.5* 02/02/2016   MCV 92.8 02/01/2016   PLT 307 02/01/2016   BMET    Component Value Date/Time   NA 131* 02/02/2016 0446   K 2.9* 02/02/2016 0446   CL 100* 02/02/2016 0446   CO2 23 02/02/2016 0446   GLUCOSE 128* 02/02/2016 0446   BUN 7 02/02/2016 0446   CREATININE 0.71 02/02/2016 0446   CALCIUM 8.1* 02/02/2016 0446   GFRNONAA >60 02/02/2016 0446   GFRAA >60 02/02/2016 0446     Assessment/Plan: 2 Days Post-Op   Active Problems:   S/P shoulder replacement   Discharge home with home health    Jenna [name], Horald Pollen 02/03/2016, 12:01 PM   Rod Can, MD Cell [phone]

## 2016-02-03 NOTE — Progress Notes (Signed)
Patient and husband provided discharge instructions and prescriptions.  No questions at time of discharge.  Belongings with patient.  Dressing changed to aquacel per Dr. Sid Falcon verbal order.  Hearing aids in ears upon discharge.

## 2016-02-04 ENCOUNTER — Encounter (HOSPITAL_COMMUNITY): Payer: Self-pay | Admitting: Orthopedic Surgery

## 2016-02-14 ENCOUNTER — Encounter: Payer: Medicare Other | Attending: Physical Medicine and Rehabilitation | Admitting: Registered Nurse

## 2016-02-14 ENCOUNTER — Encounter: Payer: Self-pay | Admitting: Registered Nurse

## 2016-02-14 VITALS — BP 143/84 | HR 77 | Resp 14

## 2016-02-14 DIAGNOSIS — Z981 Arthrodesis status: Secondary | ICD-10-CM

## 2016-02-14 DIAGNOSIS — M12812 Other specific arthropathies, not elsewhere classified, left shoulder: Secondary | ICD-10-CM | POA: Insufficient documentation

## 2016-02-14 DIAGNOSIS — G894 Chronic pain syndrome: Secondary | ICD-10-CM | POA: Diagnosis not present

## 2016-02-14 DIAGNOSIS — M12811 Other specific arthropathies, not elsewhere classified, right shoulder: Secondary | ICD-10-CM | POA: Diagnosis present

## 2016-02-14 DIAGNOSIS — Z79899 Other long term (current) drug therapy: Secondary | ICD-10-CM | POA: Diagnosis present

## 2016-02-14 DIAGNOSIS — Z5181 Encounter for therapeutic drug level monitoring: Secondary | ICD-10-CM | POA: Diagnosis present

## 2016-02-14 DIAGNOSIS — M47812 Spondylosis without myelopathy or radiculopathy, cervical region: Secondary | ICD-10-CM | POA: Diagnosis present

## 2016-02-14 DIAGNOSIS — M47816 Spondylosis without myelopathy or radiculopathy, lumbar region: Secondary | ICD-10-CM

## 2016-02-14 DIAGNOSIS — M75101 Unspecified rotator cuff tear or rupture of right shoulder, not specified as traumatic: Secondary | ICD-10-CM

## 2016-02-14 DIAGNOSIS — M75102 Unspecified rotator cuff tear or rupture of left shoulder, not specified as traumatic: Secondary | ICD-10-CM

## 2016-02-14 MED ORDER — MEPERIDINE HCL 50 MG PO TABS: 50.0000 mg | ORAL_TABLET | Freq: Three times a day (TID) | ORAL | Status: DC

## 2016-02-14 NOTE — Progress Notes (Signed)
Subjective:    Patient ID: Jenna [name], female    DOB: [birth year], 82 y.o.   MRN: [id number]  HPI: [name] is a 82 year old female who returns for follow up for chronic pain and medication refill. She states her pain is located in her left shoulder. She rates her pain 5. Her current exercise regime is walking in her home.  [name] had Left Reversed Total Shoulder Arthroplasty on 02/01/2016 via Dr. Veverly Fells. Dr. Veverly Fells prescribed Meperidine 50 mg every 4 hours she picked up the prescription on 02/08/16, she still has the 60 tablets. [name] has been using the Meperidine prescribed from our office and following Dr.Norris instructions. She was given a post dated prescription for Meperidine 03/02/2016. She verbalizes understanding.  Pain Inventory Average Pain 5 Pain Right Now 5 My pain is intermittent and sharp  In the last 24 hours, has pain interfered with the following? General activity 6 Relation with others 4 Enjoyment of life 5 What TIME of day is your pain at its worst? evening, night Sleep (in general) Fair  Pain is worse with: standing and some activites Pain improves with: heat/ice, pacing activities and medication Relief from Meds: 7  Mobility walk without assistance how many minutes can you walk? 20 ability to climb steps?  yes do you drive?  yes Do you have any goals in this area?  yes  Function retired  Neuro/Psych No problems in this area  Prior Studies Any changes since last visit?  no  Physicians involved in your care Any changes since last visit?  no   Family History  Problem Relation Age of Onset  . Stroke Mother   . Heart attack Father   . Diabetes Brother   . Heart disease Brother    Social History   Social History  . Marital Status: Married    Spouse Name: N/A  . Number of Children: N/A  . Years of Education: N/A   Social History Main Topics  . Smoking status: Former Smoker -- 1.00 packs/day for 15 years  . Smokeless  tobacco: Never Used     Comment: quit smoking in 1985  . Alcohol Use: 3.6 oz/week    6 Standard drinks or equivalent per week     Comment: occ wine w/ supper  . Drug Use: No  . Sexual Activity:    Partners: Male    Birth Control/ Protection: Surgical     Comment: hysterectomy   Other Topics Concern  . None   Social History Narrative   Past Surgical History  Procedure Laterality Date  . Rotator cuff repair Left 1996; 1981  . Cholecystectomy    . Total hip arthroplasty Left 10/2000  . Total hip arthroplasty Right 06/2005  . Rotator cuff repair Right 1996  . Vaginal delivery      x1  . Rotator cuff  Bilateral 1996/1998    repair  . Lipoma excision Left 10/1999    left thigh  . Total hip arthroplasty Left     10/2000  . Lung biopsy  05/2001    /w Dr.Burney- bronchoscopy, mediastinoscopy  . Lumbosacral cyst  2005    seen on injection for low back pain   . Appendectomy  1986    along with chole  . Breast biopsy Right 1997    sclerosing adenosis -Dr. Lucia Gaskins  . Abdominal hysterectomy  1982    BSO  . Tonsillectomy    . Anterior cervical decomp/discectomy fusion N/A 10/03/2014  Procedure: ANTERIOR CERVICAL DECOMPRESSION/DISCECTOMY FUSION 1 LEVEL,CERVICAL THREE-FOUR;  Surgeon: [name], MD;  Location: [facility];  Service: Neurosurgery;  Laterality: N/A;  . Esophagogastroduodenoscopy      with dilitation  . Colonoscopy    . Reverse shoulder arthroplasty Left 02/01/2016    Procedure: LEFT REVERSE TOTAL SHOULDER ARTHROPLASTY;  Surgeon: [name], MD;  Location: [facility];  Service: Orthopedics;  Laterality: Left;   Past Medical History  Diagnosis Date  . Lumbosacral spondylosis without myelopathy   . Osteoarthritis   . Hypercholesteremia     takes Atorvastatin daily  . 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 Dysrhythmia     (?or extra beats) treated /w atenolol  . Neuromuscular disorder ([place])     esophageal spasms at  times   . Melanoma ([place])     on left leg, wide excision   . History of blood transfusion     no abnormal reaction noted  . Seasonal allergies     takes Allegra daily and uses Flonase daily as needed  . Hypertension     takes Coreg and Losartan daily  . GERD (gastroesophageal reflux disease)     takes Omeprazole daily  . Muscle spasm     takes Robaxin daily as needed  . Hypothyroidism     takes Synthroid daily  . Asthmatic bronchitis   . Emphysema lung ([place])     left  . Shortness of breath dyspnea     with exertion  . Pneumonia     hx of.Many yrs ago  . Peripheral edema     take HCTZ daily  . Venous stasis   . Joint pain   . Chronic back pain     arthritis.Stenosis  . IBS (irritable bowel syndrome)     takes Librax daily  . History of gastric ulcer   . Cataracts, bilateral     immature  . Yeast infection     given Diflucan today  . Dysphagia   . Hard of hearing     wears hearing aides    BP 143/84 mmHg  Pulse 77  Resp 14  SpO2 96%  LMP 11/03/1980 (Approximate)  Opioid Risk Score:   Fall Risk Score:  `1  Depression screen PHQ 2/9  Depression screen [facility] 2/9 12/13/2015 01/11/2015  Decreased Interest 0 0  Down, Depressed, Hopeless 0 0  PHQ - 2 Score 0 0  Altered sleeping - 0  Tired, decreased energy - 1  Change in appetite - 0  Feeling bad or failure about yourself  - 0  Trouble concentrating - 1  Moving slowly or fidgety/restless - 0  Suicidal thoughts - 0  PHQ-9 Score - 2     Review of Systems  All other systems reviewed and are negative.      Objective:   Physical Exam  Constitutional: She is oriented to person, place, and time. She appears well-developed and well-nourished.  HENT:  Head: Normocephalic and atraumatic.  Neck: Normal range of motion. Neck supple.  Cardiovascular: Normal rate and regular rhythm.   Pulmonary/Chest: Effort normal and breath sounds normal.  Musculoskeletal:  Normal Muscle Bulk and Muscle Testing Reveals: Upper  Extremities:Right Full ROM and Muscle Strength 5/5 Left: Decreased ROM 45 Degrees wearing arm sling Back without spinal tenderness Lower Extremities: Full ROM and Muscle Strength 5/5 Arises from chair with ease Narrow based Gait  Neurological: She is alert and oriented to person, place, and  time.  Skin: Skin is warm and dry.  Psychiatric: She has a normal mood and affect.  Nursing note and vitals reviewed.         Assessment & Plan:  1. Chronic low back pain with lumbar spondylosis.  Refilled: Meperidine 50 mg one tablet three times a day #90.  We will continue the opioid monitoring program,this consists of regular clinic visits, examinations, urine drug screen, pill counts as well as use of [place] Controlled Substance reporting System. 2. Cervical stenosis with radiculitis : S/P Anterior Cervical Fusion C3-C4 with Dr. Ellene Route on 10/03/2014 3. Chronic bilateral shoulder pain L>R: Continue with Current medication regime. S/P Left Reverse Total Shoulder Arthroplasty on 02/01/16 via Dr. Veverly Fells  20 minutes of face to face patient care time was spent during this visit. All questions was encouraged and answered.   F/U in 1 month

## 2016-03-14 IMAGING — CR DG CHEST 2V
2 series · 2 of 2 positions shown · non-contrast
Comparison: Chest x-ray of 06/23/2005

CLINICAL DATA: Mild cough, edema, evaluate for congestive heart
failure

EXAM:
CHEST  2 VIEW

[view not recorded (1 of 2)]
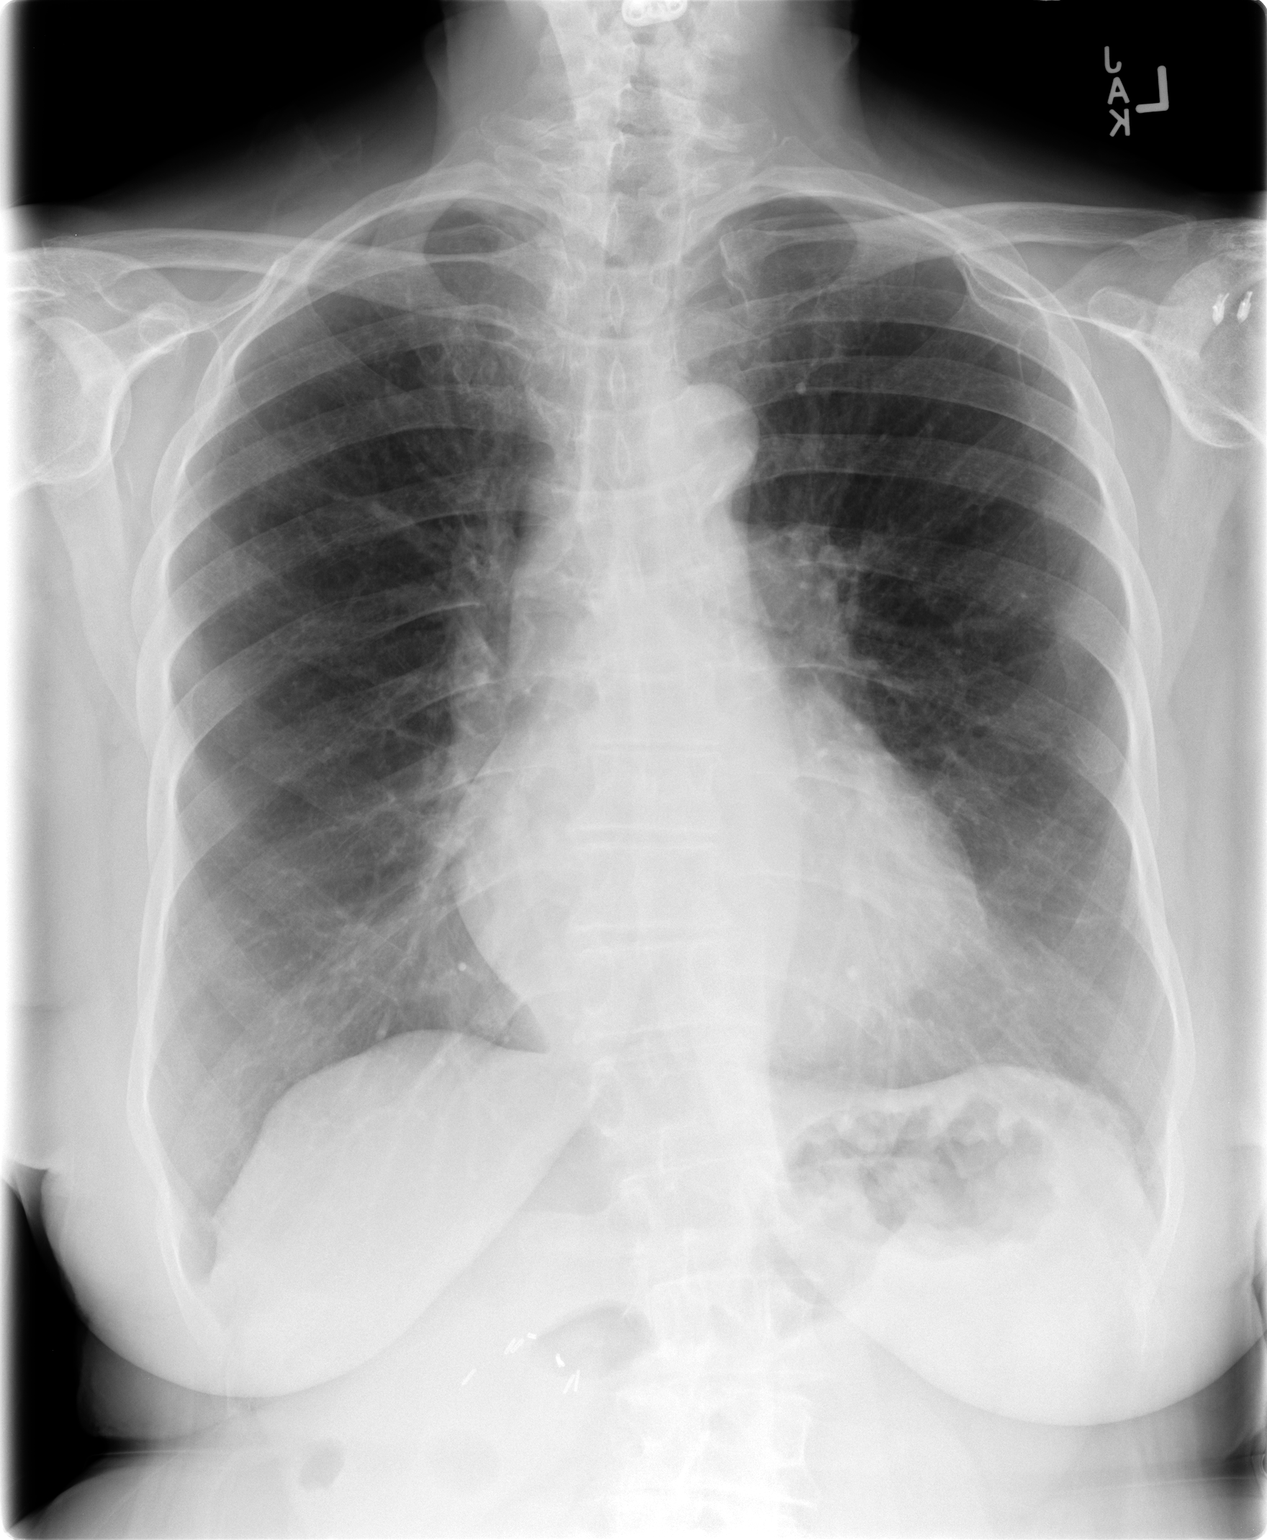

[view not recorded (2 of 2)]
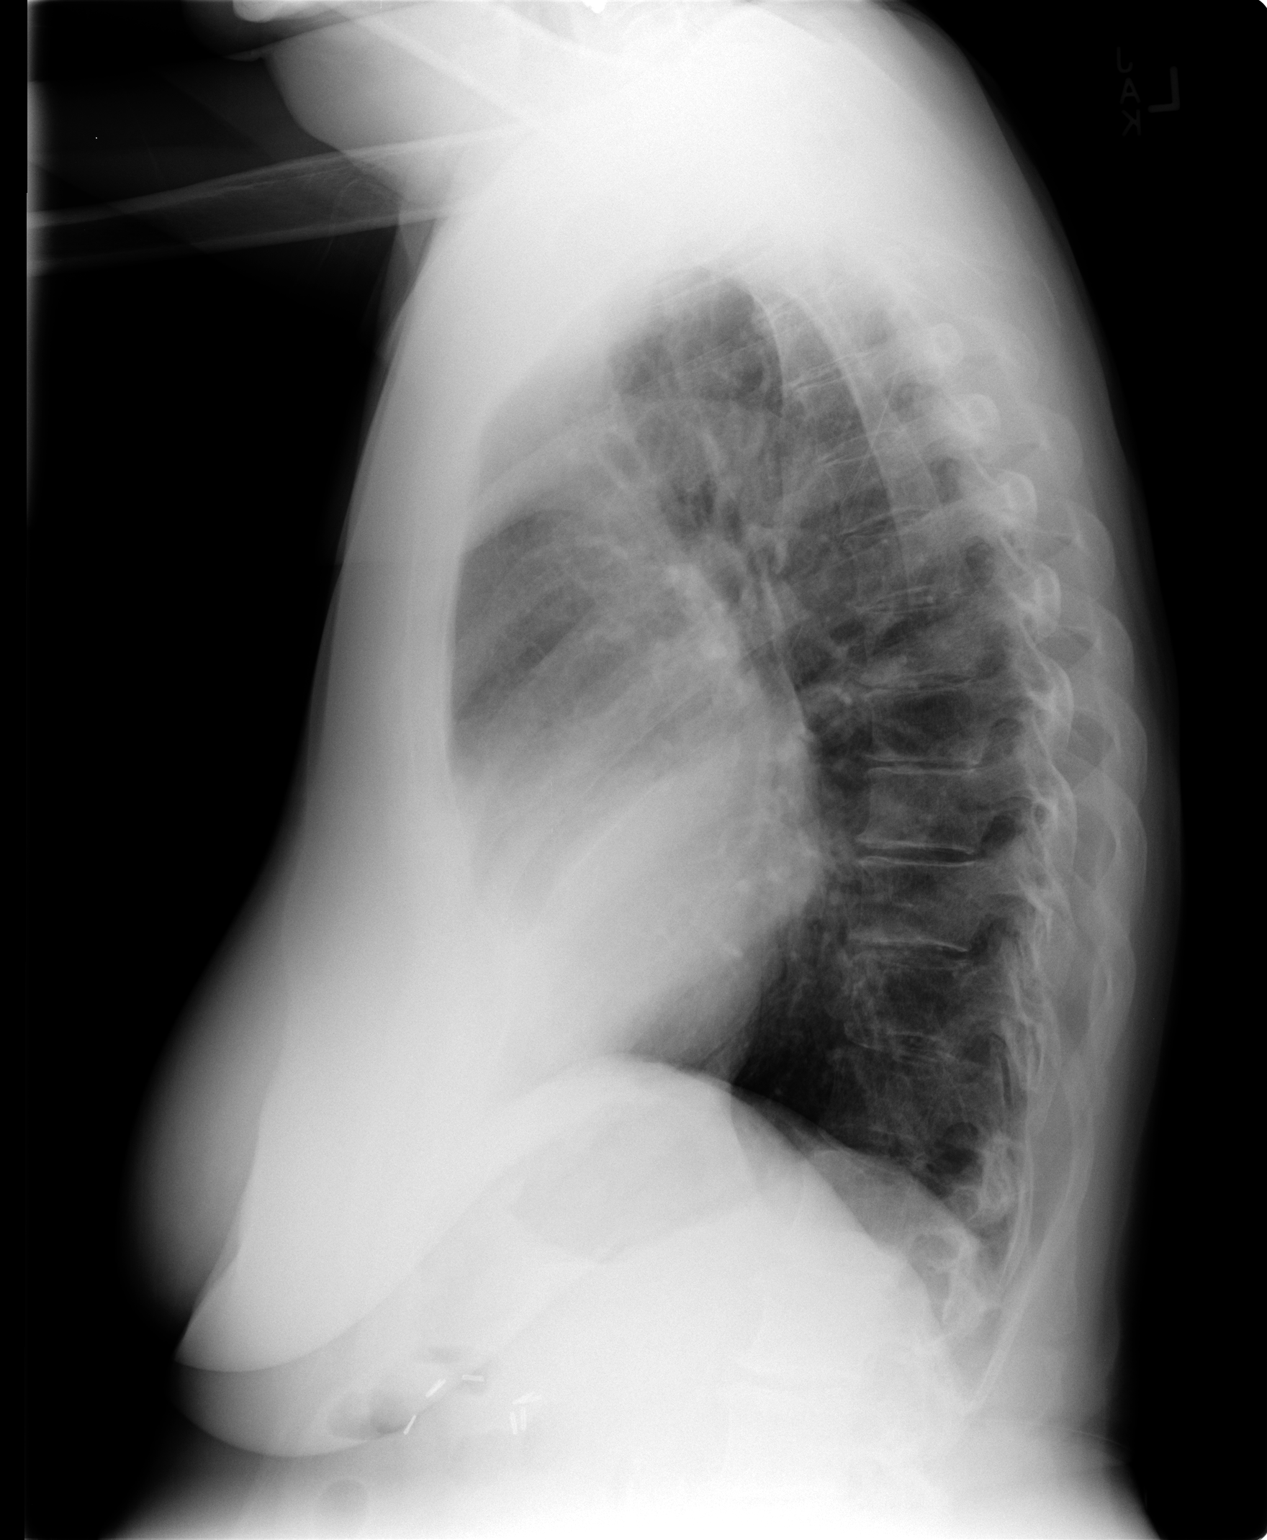

[2 of 2 positions shown; findings below may reference images not displayed]

FINDINGS: No active infiltrate or effusion is seen. There is no evidence of
edema. Mediastinal and hilar contours are unremarkable. The heart is
within upper limits of normal. No bony abnormality is seen.
IMPRESSION: No active cardiopulmonary disease.

## 2016-03-25 ENCOUNTER — Encounter: Payer: Self-pay | Admitting: Registered Nurse

## 2016-03-25 ENCOUNTER — Encounter: Payer: Medicare Other | Attending: Physical Medicine and Rehabilitation | Admitting: Registered Nurse

## 2016-03-25 VITALS — BP 169/73 | HR 71

## 2016-03-25 DIAGNOSIS — G894 Chronic pain syndrome: Secondary | ICD-10-CM

## 2016-03-25 DIAGNOSIS — M47812 Spondylosis without myelopathy or radiculopathy, cervical region: Secondary | ICD-10-CM | POA: Diagnosis present

## 2016-03-25 DIAGNOSIS — Z981 Arthrodesis status: Secondary | ICD-10-CM

## 2016-03-25 DIAGNOSIS — M47816 Spondylosis without myelopathy or radiculopathy, lumbar region: Secondary | ICD-10-CM

## 2016-03-25 DIAGNOSIS — M5416 Radiculopathy, lumbar region: Secondary | ICD-10-CM | POA: Diagnosis not present

## 2016-03-25 DIAGNOSIS — M12811 Other specific arthropathies, not elsewhere classified, right shoulder: Secondary | ICD-10-CM

## 2016-03-25 DIAGNOSIS — Z79899 Other long term (current) drug therapy: Secondary | ICD-10-CM | POA: Diagnosis present

## 2016-03-25 DIAGNOSIS — M12812 Other specific arthropathies, not elsewhere classified, left shoulder: Secondary | ICD-10-CM

## 2016-03-25 DIAGNOSIS — Z5181 Encounter for therapeutic drug level monitoring: Secondary | ICD-10-CM | POA: Diagnosis present

## 2016-03-25 DIAGNOSIS — M75101 Unspecified rotator cuff tear or rupture of right shoulder, not specified as traumatic: Secondary | ICD-10-CM

## 2016-03-25 DIAGNOSIS — M75102 Unspecified rotator cuff tear or rupture of left shoulder, not specified as traumatic: Secondary | ICD-10-CM

## 2016-03-25 MED ORDER — MEPERIDINE HCL 50 MG PO TABS: 50.0000 mg | ORAL_TABLET | Freq: Three times a day (TID) | ORAL | Status: DC

## 2016-03-25 NOTE — Progress Notes (Signed)
Subjective:    Patient ID: Jenna [name], female    DOB: 1933-01-04, 82 y.o.   MRN: [id number]  HPI: Jenna [name] is a 82 year old female who returns for follow up for chronic pain and medication refill. She states her pain is located in her left shoulder and lower back radiating into right posterior lower extremity. She rates her pain 3. Her current exercise regime is walking in her home.  Jenna [name] had Left Reversed Total Shoulder Arthroplasty on 02/01/2016 via Dr. Veverly Fells.  Pain Inventory Average Pain 4 Pain Right Now 3 My pain is sharp and aching  In the last 24 hours, has pain interfered with the following? General activity 4 Relation with others 1 Enjoyment of life 3 What TIME of day is your pain at its worst? daytime, evening, night Sleep (in general) Good  Pain is worse with: walking, sitting, standing and some activites Pain improves with: pacing activities and medication Relief from Meds: 6  Mobility walk without assistance how many minutes can you walk? 20 ability to climb steps?  yes do you drive?  yes Do you have any goals in this area?  yes  Function retired Do you have any goals in this area?  no  Neuro/Psych No problems in this area  Prior Studies Any changes since last visit?  no  Physicians involved in your care Any changes since last visit?  no   Family History  Problem Relation Age of Onset  . Stroke Mother   . Heart attack Father   . Diabetes Brother   . Heart disease Brother    Social History   Social History  . Marital Status: Married    Spouse Name: N/A  . Number of Children: N/A  . Years of Education: N/A   Social History Main Topics  . Smoking status: Former Smoker -- 1.00 packs/day for 15 years  . Smokeless tobacco: Never Used     Comment: quit smoking in 1985  . Alcohol Use: 3.6 oz/week    6 Standard drinks or equivalent per week     Comment: occ wine w/ supper  . Drug Use: No  . Sexual Activity:    Partners:  Male    Birth Control/ Protection: Surgical     Comment: hysterectomy   Other Topics Concern  . None   Social History Narrative   Past Surgical History  Procedure Laterality Date  . Rotator cuff repair Left 1996; 1981  . Cholecystectomy    . Total hip arthroplasty Left 10/2000  . Total hip arthroplasty Right 06/2005  . Rotator cuff repair Right 1996  . Vaginal delivery      x1  . Rotator cuff  Bilateral 1996/1998    repair  . Lipoma excision Left 10/1999    left thigh  . Total hip arthroplasty Left     10/2000  . Lung biopsy  05/2001    /w Dr.Burney- bronchoscopy, mediastinoscopy  . Lumbosacral cyst  2005    seen on injection for low back pain   . Appendectomy  1986    along with chole  . Breast biopsy Right 1997    sclerosing adenosis -Dr. Lucia Gaskins  . Abdominal hysterectomy  1982    BSO  . Tonsillectomy    . Anterior cervical decomp/discectomy fusion N/A 10/03/2014    Procedure: ANTERIOR CERVICAL DECOMPRESSION/DISCECTOMY FUSION 1 LEVEL,CERVICAL THREE-FOUR;  Surgeon: [name], MD;  Location: [facility];  Service: Neurosurgery;  Laterality: N/A;  . Esophagogastroduodenoscopy  with dilitation  . Colonoscopy    . Reverse shoulder arthroplasty Left 02/01/2016    Procedure: LEFT REVERSE TOTAL SHOULDER ARTHROPLASTY;  Surgeon: [name], MD;  Location: [facility];  Service: Orthopedics;  Laterality: Left;   Past Medical History  Diagnosis Date  . Lumbosacral spondylosis without myelopathy   . Osteoarthritis   . Hypercholesteremia     takes Atorvastatin daily  . 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 Dysrhythmia     (?or extra beats) treated /w atenolol  . Neuromuscular disorder ([place])     esophageal spasms at times   . Melanoma ([place])     on left leg, wide excision   . History of blood transfusion     no abnormal reaction noted  . Seasonal allergies     takes Allegra daily and uses Flonase daily as needed  .  Hypertension     takes Coreg and Losartan daily  . GERD (gastroesophageal reflux disease)     takes Omeprazole daily  . Muscle spasm     takes Robaxin daily as needed  . Hypothyroidism     takes Synthroid daily  . Asthmatic bronchitis   . Emphysema lung ([place])     left  . Shortness of breath dyspnea     with exertion  . Pneumonia     hx of.Many yrs ago  . Peripheral edema     take HCTZ daily  . Venous stasis   . Joint pain   . Chronic back pain     arthritis.Stenosis  . IBS (irritable bowel syndrome)     takes Librax daily  . History of gastric ulcer   . Cataracts, bilateral     immature  . Yeast infection     given Diflucan today  . Dysphagia   . Hard of hearing     wears hearing aides    BP 169/73 mmHg  Pulse 71  SpO2 98%  LMP 11/03/1980 (Approximate)  Opioid Risk Score:   Fall Risk Score:  `1  Depression screen PHQ 2/9  Depression screen [facility] 2/9 12/13/2015 01/11/2015  Decreased Interest 0 0  Down, Depressed, Hopeless 0 0  PHQ - 2 Score 0 0  Altered sleeping - 0  Tired, decreased energy - 1  Change in appetite - 0  Feeling bad or failure about yourself  - 0  Trouble concentrating - 1  Moving slowly or fidgety/restless - 0  Suicidal thoughts - 0  PHQ-9 Score - 2     Review of Systems  All other systems reviewed and are negative.      Objective:   Physical Exam  Constitutional: She is oriented to person, place, and time. She appears well-developed and well-nourished.  HENT:  Head: Normocephalic and atraumatic.  Neck: Normal range of motion. Neck supple.  Cardiovascular: Normal rate and regular rhythm.   Pulmonary/Chest: Effort normal and breath sounds normal.  Musculoskeletal:  Normal Muscle Bulk and Muscle Testing Reveals: Upper Extremities: Right: Full ROM and Muscle Strength 5/5 Left: Decreased ROM 90 Degrees and Muscle Strength 5/5 Lumbar Paraspinal Tenderness: L-4- L-5 Lower Extremities: Full ROM and Muscle Strength 5/5 Arises from chair  with ease Narrow Based Gait  Neurological: She is alert and oriented to person, place, and time.  Skin: Skin is warm and dry.  Psychiatric: She has a normal mood and affect.  Nursing note and vitals reviewed.         Assessment &  Plan:  1. Chronic low back pain with lumbar spondylosis.  Refilled: Meperidine 50 mg one tablet three times a day #90. We will continue the opioid monitoring program,this consists of regular clinic visits, examinations, urine drug screen, pill counts as well as use of [place] Controlled Substance reporting System. 2. Lumbar Radiculitis: Schedule MBB with Dr. Letta Pate 3. Cervical stenosis with radiculitis : S/P Anterior Cervical Fusion C3-C4 with Dr. Ellene Route on 10/03/2014 4. Chronic bilateral shoulder pain L>R: Continue with Current medication regime. S/P Left Reverse Total Shoulder Arthroplasty on 02/01/16 via Dr. Veverly Fells  20 minutes of face to face patient care time was spent during this visit. All questions was encouraged and answered.   F/U in 1 month

## 2016-04-15 ENCOUNTER — Ambulatory Visit: Payer: Medicare Other | Admitting: Physical Medicine & Rehabilitation

## 2016-04-22 ENCOUNTER — Encounter: Payer: Self-pay | Admitting: Physical Medicine & Rehabilitation

## 2016-04-22 ENCOUNTER — Ambulatory Visit (HOSPITAL_BASED_OUTPATIENT_CLINIC_OR_DEPARTMENT_OTHER): Payer: Medicare Other | Admitting: Physical Medicine & Rehabilitation

## 2016-04-22 ENCOUNTER — Encounter: Payer: Medicare Other | Attending: Physical Medicine and Rehabilitation

## 2016-04-22 VITALS — BP 132/81 | HR 76 | Resp 17

## 2016-04-22 DIAGNOSIS — Z79899 Other long term (current) drug therapy: Secondary | ICD-10-CM | POA: Insufficient documentation

## 2016-04-22 DIAGNOSIS — M12812 Other specific arthropathies, not elsewhere classified, left shoulder: Secondary | ICD-10-CM | POA: Diagnosis present

## 2016-04-22 DIAGNOSIS — Z5181 Encounter for therapeutic drug level monitoring: Secondary | ICD-10-CM | POA: Diagnosis present

## 2016-04-22 DIAGNOSIS — M47816 Spondylosis without myelopathy or radiculopathy, lumbar region: Secondary | ICD-10-CM | POA: Diagnosis not present

## 2016-04-22 DIAGNOSIS — M12811 Other specific arthropathies, not elsewhere classified, right shoulder: Secondary | ICD-10-CM | POA: Diagnosis present

## 2016-04-22 DIAGNOSIS — M47812 Spondylosis without myelopathy or radiculopathy, cervical region: Secondary | ICD-10-CM | POA: Diagnosis present

## 2016-04-22 MED ORDER — MEPERIDINE HCL 50 MG PO TABS: 50.0000 mg | ORAL_TABLET | Freq: Three times a day (TID) | ORAL | Status: DC

## 2016-04-22 NOTE — Patient Instructions (Signed)

## 2016-04-22 NOTE — Progress Notes (Signed)
Bilateral Lumbar L3, L4  medial branch blocks and L 5 dorsal ramus injection under fluoroscopic guidance  Indication: Lumbar pain which is not relieved by medication management or other conservative care and interfering with self-care and mobility.  Informed consent was obtained after describing risks and benefits of the procedure with the patient, this includes bleeding, infection, paralysis and medication side effects.  The patient wishes to proceed and has given written consent.  The patient was placed in prone position.  The lumbar area was marked and prepped with Betadine.  One mL of 1% lidocaine was injected into each of 6 areas into the skin and subcutaneous tissue.  Then a 22-gauge 3.5in spinal needle was inserted targeting the junction of the left S1 superior articular process and sacral ala junction. Needle was advanced under fluoroscopic guidance.  Bone contact was made.  Omnipaque 180 was injected x 0.5 mL demonstrating no intravascular uptake.  Then a solution containing one mL of 4 mg per mL dexamethasone and 3 mL of 2% MPF lidocaine was injected x 0.5 mL.  Then the left L5 superior articular process in transverse process junction was targeted.  Bone contact was made.  Omnipaque 180 was injected x 0.5 mL demonstrating no intravascular uptake. Then a solution containing one mL of 4 mg per mL dexamethasone and 3 mL of 2% MPF lidocaine was injected x 0.5 mL.  Then the left L4 superior articular process in transverse process junction was targeted.  Bone contact was made.  Omnipaque 180 was injected x 0.5 mL demonstrating no intravascular uptake.  Then a solution containing one mL of 4 mg per mL dexamethasone and 3 mL if 2% MPF lidocaine was injected x 0.5 mL.  This same procedure was performed on the right side using the same needle, technique and injectate.  Patient tolerated procedure well.  Post procedure instructions were given.

## 2016-04-22 NOTE — Progress Notes (Signed)
  PROCEDURE RECORD [facility]   Name: Jenna [name] DOB:1933-02-20 MRN: [id number]  Date:04/22/2016  Physician: Alysia Penna, MD    Nurse/CMA: Leafy Ro  Allergies:  Allergies  Allergen Reactions  . Codeine Shortness Of Breath  . Pseudoephedrine Hcl Shortness Of Breath  . Tylenol [Acetaminophen] Shortness Of Breath and Palpitations  . Ultram [Tramadol] Shortness Of Breath  . Vicodin [Hydrocodone-Acetaminophen] Shortness Of Breath  . [place] [Flurazepam Hcl] Other (See Comments)    confusion  . Cephalexin Nausea Only  . Clarithromycin Nausea Only  . Vibramycin [Doxycycline Calcium] Rash    Consent Signed: Yes.    Is patient diabetic? No.  CBG today? NA  Pregnant: No. LMP: Patient's last menstrual period was 11/03/1980 (approximate). (age 52-55)  Anticoagulants: no Anti-inflammatory: no Antibiotics: no  Procedure: Medial Branch Block Position: Prone Start Time: 1250 End Time: 1300  Fluoro Time: 39  RN/CMA Wood, CMA Wood, CMA    Time 1233 1305    BP 132/81 164/74    Pulse 76 77    Respirations 16 16    O2 Sat NA NA    S/S 6 6    Pain Level 4/10 3/10     D/C home with husband, [name], D/C instructions reviewed, and sits independently.

## 2016-04-29 ENCOUNTER — Encounter: Payer: Self-pay | Admitting: Nurse Practitioner

## 2016-05-27 ENCOUNTER — Encounter: Payer: Medicare Other | Attending: Physical Medicine and Rehabilitation | Admitting: Registered Nurse

## 2016-05-27 ENCOUNTER — Encounter: Payer: Self-pay | Admitting: Registered Nurse

## 2016-05-27 VITALS — BP 151/86 | HR 74

## 2016-05-27 DIAGNOSIS — Z981 Arthrodesis status: Secondary | ICD-10-CM | POA: Diagnosis not present

## 2016-05-27 DIAGNOSIS — G894 Chronic pain syndrome: Secondary | ICD-10-CM

## 2016-05-27 DIAGNOSIS — M47816 Spondylosis without myelopathy or radiculopathy, lumbar region: Secondary | ICD-10-CM | POA: Diagnosis present

## 2016-05-27 DIAGNOSIS — M5416 Radiculopathy, lumbar region: Secondary | ICD-10-CM

## 2016-05-27 DIAGNOSIS — Z5181 Encounter for therapeutic drug level monitoring: Secondary | ICD-10-CM | POA: Diagnosis present

## 2016-05-27 DIAGNOSIS — Z79899 Other long term (current) drug therapy: Secondary | ICD-10-CM | POA: Diagnosis present

## 2016-05-27 DIAGNOSIS — M12811 Other specific arthropathies, not elsewhere classified, right shoulder: Secondary | ICD-10-CM | POA: Insufficient documentation

## 2016-05-27 DIAGNOSIS — M12812 Other specific arthropathies, not elsewhere classified, left shoulder: Secondary | ICD-10-CM | POA: Insufficient documentation

## 2016-05-27 DIAGNOSIS — M47812 Spondylosis without myelopathy or radiculopathy, cervical region: Secondary | ICD-10-CM | POA: Diagnosis present

## 2016-05-27 MED ORDER — MEPERIDINE HCL 50 MG PO TABS: 50.0000 mg | ORAL_TABLET | Freq: Three times a day (TID) | ORAL | 0 refills | Status: DC

## 2016-05-27 NOTE — Progress Notes (Signed)
Subjective:    Patient ID: Jenna [name], female    DOB: 1933/05/22, 82 y.o.   MRN: [id number]  HPI: [name] is a 82 year old female who returns for follow up for chronic pain and medication refill. She states her pain is located in her left shoulder and lower back radiating into  lower extremities laterally. She rates her pain 2. Her current exercise regime is walking in her home, using the treadmill for 20 minutes and housework. Also states she had some relief from bilateral MBB.  Ms.Demaree had Left Reversed Total Shoulder Arthroplasty on 02/01/2016 via Dr. Veverly Fells.  Pain Inventory Average Pain 4 Pain Right Now 2 My pain is intermittent and aching  In the last 24 hours, has pain interfered with the following? General activity 3 Relation with others 1 Enjoyment of life 3 What TIME of day is your pain at its worst? daytime and evening Sleep (in general) Good  Pain is worse with: sitting, standing and some activites Pain improves with: rest and medication Relief from Meds: 6  Mobility walk without assistance how many minutes can you walk? 20 ability to climb steps?  yes do you drive?  yes Do you have any goals in this area?  yes  Function retired Do you have any goals in this area?  no  Neuro/Psych numbness  Prior Studies Any changes since last visit?  no  Physicians involved in your care Any changes since last visit?  no   Family History  Problem Relation Age of Onset  . Stroke Mother   . Heart attack Father   . Diabetes Brother   . Heart disease Brother    Social History   Social History  . Marital status: Married    Spouse name: N/A  . Number of children: N/A  . Years of education: N/A   Social History Main Topics  . Smoking status: Former Smoker    Packs/day: 1.00    Years: 15.00  . Smokeless tobacco: Never Used     Comment: quit smoking in 1985  . Alcohol use 3.6 oz/week    6 Standard drinks or equivalent per week     Comment:  occ wine w/ supper  . Drug use: No  . Sexual activity: Yes    Partners: Male    Birth control/ protection: Surgical     Comment: hysterectomy   Other Topics Concern  . None   Social History Narrative  . None   Past Surgical History:  Procedure Laterality Date  . ABDOMINAL HYSTERECTOMY  1982   BSO  . ANTERIOR CERVICAL DECOMP/DISCECTOMY FUSION N/A 10/03/2014   Procedure: ANTERIOR CERVICAL DECOMPRESSION/DISCECTOMY FUSION 1 LEVEL,CERVICAL THREE-FOUR;  Surgeon: [name], MD;  Location: [facility];  Service: Neurosurgery;  Laterality: N/A;  . APPENDECTOMY  1986   along with chole  . BREAST BIOPSY Right 1997   sclerosing adenosis -Dr. Lucia Gaskins  . CHOLECYSTECTOMY    . COLONOSCOPY    . ESOPHAGOGASTRODUODENOSCOPY     with dilitation  . LIPOMA EXCISION Left 10/1999   left thigh  . lumbosacral cyst  2005   seen on injection for low back pain   . LUNG BIOPSY  05/2001   /w Dr.Burney- bronchoscopy, mediastinoscopy  . REVERSE SHOULDER ARTHROPLASTY Left 02/01/2016   Procedure: LEFT REVERSE TOTAL SHOULDER ARTHROPLASTY;  Surgeon: [name], MD;  Location: [facility];  Service: Orthopedics;  Laterality: Left;  . rotator cuff  Bilateral 1996/1998   repair  . ROTATOR  CUFF REPAIR Left 1996; 1981  . ROTATOR CUFF REPAIR Right 1996  . TONSILLECTOMY    . TOTAL HIP ARTHROPLASTY Left 10/2000  . TOTAL HIP ARTHROPLASTY Right 06/2005  . TOTAL HIP ARTHROPLASTY Left    10/2000  . VAGINAL DELIVERY     x1   Past Medical History:  Diagnosis Date  . Asthmatic bronchitis   . Cataracts, bilateral    immature  . Chronic back pain    arthritis.Stenosis  . Dysphagia   . Dysrhythmia    (?or extra beats) treated /w atenolol  . Emphysema lung ([place])    left  . GERD (gastroesophageal reflux disease)    takes Omeprazole daily  . Hard of hearing    wears hearing aides   . History of blood transfusion    no abnormal reaction noted  . History of gastric ulcer   . Hypercholesteremia    takes Atorvastatin  daily  . Hypertension    takes Coreg and Losartan daily  . Hypothyroidism    takes Synthroid daily  . IBS (irritable bowel syndrome)    takes Librax daily  . Joint pain   . Lumbosacral spondylosis without myelopathy   . Melanoma ([place])    on left leg, wide excision   . Muscle spasm    takes Robaxin daily as needed  . Neuromuscular disorder ([place])    esophageal spasms at times   . Osteoarthritis   . Peripheral edema    take HCTZ daily  . Pneumonia    hx of.Many yrs ago  . 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 Seasonal allergies    takes Allegra daily and uses Flonase daily as needed  . Shortness of breath dyspnea    with exertion  . Venous stasis   . Yeast infection    given Diflucan today   LMP 11/03/1980 (Approximate)   Opioid Risk Score:   Fall Risk Score:  `1  Depression screen PHQ 2/9  Depression screen [facility] 2/9 12/13/2015 01/11/2015  Decreased Interest 0 0  Down, Depressed, Hopeless 0 0  PHQ - 2 Score 0 0  Altered sleeping - 0  Tired, decreased energy - 1  Change in appetite - 0  Feeling bad or failure about yourself  - 0  Trouble concentrating - 1  Moving slowly or fidgety/restless - 0  Suicidal thoughts - 0  PHQ-9 Score - 2    Review of Systems  Constitutional: Negative.   HENT: Negative.   Eyes: Negative.   Respiratory: Negative.   Cardiovascular: Negative.   Gastrointestinal: Negative.   Endocrine: Negative.   Genitourinary: Negative.   Musculoskeletal: Negative.   Skin: Negative.   Allergic/Immunologic: Negative.   Neurological: Positive for numbness.  Hematological: Negative.   Psychiatric/Behavioral: Negative.        Objective:   Physical Exam  Constitutional: She is oriented to person, place, and time. She appears well-developed and well-nourished.  HENT:  Head: Normocephalic and atraumatic.  Neck: Normal range of motion. Neck supple.  Cardiovascular: Normal rate and regular rhythm.     Pulmonary/Chest: Effort normal and breath sounds normal.  Musculoskeletal:  Normal Muscle Bulk and Muscle Testing Reveals: Upper Extremities: Right: Full ROM and Muscle Strength 5/5 Left: Decreased ROM 90 Degrees and Muscle Strength 4/5 Lumbar Paraspinal Tenderness: L-3- L-5 Lower Extremities: Full ROM and Muscle Strength 5/5 Arises from table with ease Narrow Based Gait  Neurological: She is alert and oriented to person, place,  and time.  Skin: Skin is warm and dry.  Psychiatric: She has a normal mood and affect.  Nursing note and vitals reviewed.         Assessment & Plan:  1.Chronic low back pain with lumbar spondylosis.  Refilled: Meperidine 50 mg one tablet three times a day #90. Second script given for the following month to accommodate schedule appointment. We will continue the opioid monitoring program,this consists of regular clinic visits, examinations, urine drug screen, pill counts as well as use of [place] Controlled Substance reporting System. 2. Lumbar Radiculitis: S/P MBB with relief noted. 3. Cervical stenosis with radiculitis : S/P Anterior Cervical Fusion C3-C4 with Dr. Ellene Route on 10/03/2014 4. Chronic bilateral shoulder pain L>R: Continue with Current medication regime. S/P Left Reverse Total Shoulder Arthroplasty on 02/01/16 via Dr. Veverly Fells  20 minutes of face to face patient care time was spent during this visit. All questions was encouraged and answered.   F/U in 1 month

## 2016-06-05 LAB — TOXASSURE SELECT,+ANTIDEPR,UR: PDF: 0

## 2016-06-11 NOTE — Telephone Encounter (Signed)
error 

## 2016-06-12 NOTE — Progress Notes (Signed)
Urine drug screen for this encounter is consistent for prescribed medication 

## 2016-07-01 ENCOUNTER — Encounter: Payer: Self-pay | Admitting: Registered Nurse

## 2016-07-01 ENCOUNTER — Encounter: Payer: Medicare Other | Attending: Physical Medicine and Rehabilitation | Admitting: Registered Nurse

## 2016-07-01 VITALS — BP 155/87 | HR 69 | Resp 17

## 2016-07-01 DIAGNOSIS — M12812 Other specific arthropathies, not elsewhere classified, left shoulder: Secondary | ICD-10-CM | POA: Diagnosis present

## 2016-07-01 DIAGNOSIS — G894 Chronic pain syndrome: Secondary | ICD-10-CM | POA: Diagnosis not present

## 2016-07-01 DIAGNOSIS — M47812 Spondylosis without myelopathy or radiculopathy, cervical region: Secondary | ICD-10-CM | POA: Insufficient documentation

## 2016-07-01 DIAGNOSIS — M47816 Spondylosis without myelopathy or radiculopathy, lumbar region: Secondary | ICD-10-CM | POA: Insufficient documentation

## 2016-07-01 DIAGNOSIS — Z79899 Other long term (current) drug therapy: Secondary | ICD-10-CM | POA: Insufficient documentation

## 2016-07-01 DIAGNOSIS — M12811 Other specific arthropathies, not elsewhere classified, right shoulder: Secondary | ICD-10-CM | POA: Insufficient documentation

## 2016-07-01 DIAGNOSIS — Z981 Arthrodesis status: Secondary | ICD-10-CM

## 2016-07-01 DIAGNOSIS — M5416 Radiculopathy, lumbar region: Secondary | ICD-10-CM | POA: Diagnosis not present

## 2016-07-01 DIAGNOSIS — Z5181 Encounter for therapeutic drug level monitoring: Secondary | ICD-10-CM | POA: Insufficient documentation

## 2016-07-01 NOTE — Progress Notes (Signed)
Subjective:    Patient ID: Jenna [name], female    DOB: [birth year], 82 y.o.   MRN: [id number]  HPI: Ms. ARIELI [name] is a 82 year old female who returns for follow up for chronic pain and medication refill. She states her pain is located in her  lower back radiating into  lower extremities posteriorly. She rates her pain 3. Her current exercise regime is walking in her home, using the treadmill for 20 minutes and housework. We discuss decreasing her Meperidine to BID, she states her pain at night increases with intensity which requires her to take the 3rd pill. She will try to decreased to BID and we will continue to taper as tolerated and evaluate at her next appointment she verbalizes understanding.   Pain Inventory Average Pain 5  Pain Right Now 3 My pain is intermittent and aching  In the last 24 hours, has pain interfered with the following? General activity 3 Relation with others 1 Enjoyment of life 3 What TIME of day is your pain at its worst? evening, night Sleep (in general) Fair  Pain is worse with: standing and some activites Pain improves with: pacing activities and medication Relief from Meds: 6  Mobility walk without assistance how many minutes can you walk? 15 ability to climb steps?  yes do you drive?  yes Do you have any goals in this area?  yes  Function retired Do you have any goals in this area?  no  Neuro/Psych No problems in this area  Prior Studies Any changes since last visit?  no  Physicians involved in your care Any changes since last visit?  no   Family History  Problem Relation Age of Onset  . Stroke Mother   . Heart attack Father   . Diabetes Brother   . Heart disease Brother    Social History   Social History  . Marital status: Married    Spouse name: N/A  . Number of children: N/A  . Years of education: N/A   Social History Main Topics  . Smoking status: Former Smoker    Packs/day: 1.00    Years: 15.00  .  Smokeless tobacco: Never Used     Comment: quit smoking in 1985  . Alcohol use 3.6 oz/week    6 Standard drinks or equivalent per week     Comment: occ wine w/ supper  . Drug use: No  . Sexual activity: Yes    Partners: Male    Birth control/ protection: Surgical     Comment: hysterectomy   Other Topics Concern  . None   Social History Narrative  . None   Past Surgical History:  Procedure Laterality Date  . ABDOMINAL HYSTERECTOMY  1982   BSO  . ANTERIOR CERVICAL DECOMP/DISCECTOMY FUSION N/A 10/03/2014   Procedure: ANTERIOR CERVICAL DECOMPRESSION/DISCECTOMY FUSION 1 LEVEL,CERVICAL THREE-FOUR;  Surgeon: [name], MD;  Location: [facility];  Service: Neurosurgery;  Laterality: N/A;  . APPENDECTOMY  1986   along with chole  . BREAST BIOPSY Right 1997   sclerosing adenosis -Dr. Lucia Gaskins  . CHOLECYSTECTOMY    . COLONOSCOPY    . ESOPHAGOGASTRODUODENOSCOPY     with dilitation  . LIPOMA EXCISION Left 10/1999   left thigh  . lumbosacral cyst  2005   seen on injection for low back pain   . LUNG BIOPSY  05/2001   /w Dr.Burney- bronchoscopy, mediastinoscopy  . REVERSE SHOULDER ARTHROPLASTY Left 02/01/2016   Procedure: LEFT REVERSE TOTAL SHOULDER  ARTHROPLASTY;  Surgeon: [name], MD;  Location: [facility];  Service: Orthopedics;  Laterality: Left;  . rotator cuff  Bilateral 1996/1998   repair  . ROTATOR CUFF REPAIR Left 1996; 1981  . ROTATOR CUFF REPAIR Right 1996  . TONSILLECTOMY    . TOTAL HIP ARTHROPLASTY Left 10/2000  . TOTAL HIP ARTHROPLASTY Right 06/2005  . TOTAL HIP ARTHROPLASTY Left    10/2000  . VAGINAL DELIVERY     x1   Past Medical History:  Diagnosis Date  . Asthmatic bronchitis   . Cataracts, bilateral    immature  . Chronic back pain    arthritis.Stenosis  . Dysphagia   . Dysrhythmia    (?or extra beats) treated /w atenolol  . Emphysema lung ([place])    left  . GERD (gastroesophageal reflux disease)    takes Omeprazole daily  . Hard of hearing    wears  hearing aides   . History of blood transfusion    no abnormal reaction noted  . History of gastric ulcer   . Hypercholesteremia    takes Atorvastatin daily  . Hypertension    takes Coreg and Losartan daily  . Hypothyroidism    takes Synthroid daily  . IBS (irritable bowel syndrome)    takes Librax daily  . Joint pain   . Lumbosacral spondylosis without myelopathy   . Melanoma ([place])    on left leg, wide excision   . Muscle spasm    takes Robaxin daily as needed  . Neuromuscular disorder ([place])    esophageal spasms at times   . Osteoarthritis   . Peripheral edema    take HCTZ daily  . Pneumonia    hx of.Many yrs ago  . 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 Seasonal allergies    takes Allegra daily and uses Flonase daily as needed  . Shortness of breath dyspnea    with exertion  . Venous stasis   . Yeast infection    given Diflucan today   LMP 11/03/1980 (Approximate)   Opioid Risk Score:   Fall Risk Score:  `1  Depression screen PHQ 2/9  Depression screen [facility] 2/9 12/13/2015 01/11/2015  Decreased Interest 0 0  Down, Depressed, Hopeless 0 0  PHQ - 2 Score 0 0  Altered sleeping - 0  Tired, decreased energy - 1  Change in appetite - 0  Feeling bad or failure about yourself  - 0  Trouble concentrating - 1  Moving slowly or fidgety/restless - 0  Suicidal thoughts - 0  PHQ-9 Score - 2    Review of Systems  All other systems reviewed and are negative.      Objective:   Physical Exam  Constitutional: She is oriented to person, place, and time. She appears well-developed and well-nourished.  HENT:  Head: Normocephalic and atraumatic.  Neck: Normal range of motion. Neck supple.  Cardiovascular: Normal rate and regular rhythm.   Pulmonary/Chest: Effort normal and breath sounds normal.  Musculoskeletal:  Normal Muscle Bulk and Muscle Testing Reveals: Upper Extremities: Right: Full ROM and Muscle Strength 5/5 Left:  Decreased ROM 90 Degrees and Muscle Strength 5/5 Back without spinal tenderness noted Lower Extremities: Full ROM and Muscle Strength 5/5 Arises from Table with ease Narrow based gait  Neurological: She is alert and oriented to person, place, and time.  Skin: Skin is warm and dry.  Psychiatric: She has a normal mood and affect.  Nursing note and  vitals reviewed.         Assessment & Plan:  1.Chronic low back pain with lumbar spondylosis.  Refilled: Meperidine 50 mg one tablet three times a day #90. Second script given for the following month to accommodate schedule appointment. We will continue the opioid monitoring program,this consists of regular clinic visits, examinations, urine drug screen, pill counts as well as use of [place] Controlled Substance reporting System. 2. Lumbar Radiculitis: S/P MBB with relief noted. 3. Cervical stenosis with radiculitis : S/P Anterior Cervical Fusion C3-C4 with Dr. Ellene Route on 10/03/2014 4. Chronic bilateral shoulder pain L>R: Continue with Current medication regime. S/P Left Reverse Total Shoulder Arthroplasty on 02/01/16 via Dr. Veverly Fells  20 minutes of face to face patient care time was spent during this visit. All questions was encouraged and answered.   F/U in 1 month

## 2016-07-11 ENCOUNTER — Other Ambulatory Visit: Payer: Self-pay | Admitting: Nurse Practitioner

## 2016-07-11 NOTE — Telephone Encounter (Signed)
Medication refill request: Estradiol (Vivelle-Dot) Last AEX:  07/11/15 PG Next AEX: 08/05/16 PG Last MMG (if hormonal medication request): 04/10/16 BIRADS1 Refill authorized: 07/11/15 #24 Patch 3R. Please advise. Thank you.

## 2016-07-15 ENCOUNTER — Ambulatory Visit: Payer: Medicare Other | Admitting: Nurse Practitioner

## 2016-07-28 ENCOUNTER — Encounter: Payer: Self-pay | Admitting: Registered Nurse

## 2016-07-28 ENCOUNTER — Encounter: Payer: Medicare Other | Attending: Physical Medicine and Rehabilitation | Admitting: Registered Nurse

## 2016-07-28 VITALS — BP 155/65 | HR 71 | Resp 14

## 2016-07-28 DIAGNOSIS — G894 Chronic pain syndrome: Secondary | ICD-10-CM

## 2016-07-28 DIAGNOSIS — M545 Low back pain, unspecified: Secondary | ICD-10-CM

## 2016-07-28 DIAGNOSIS — Z79899 Other long term (current) drug therapy: Secondary | ICD-10-CM | POA: Insufficient documentation

## 2016-07-28 DIAGNOSIS — G8929 Other chronic pain: Secondary | ICD-10-CM

## 2016-07-28 DIAGNOSIS — M12811 Other specific arthropathies, not elsewhere classified, right shoulder: Secondary | ICD-10-CM | POA: Insufficient documentation

## 2016-07-28 DIAGNOSIS — M12812 Other specific arthropathies, not elsewhere classified, left shoulder: Secondary | ICD-10-CM | POA: Insufficient documentation

## 2016-07-28 DIAGNOSIS — M47816 Spondylosis without myelopathy or radiculopathy, lumbar region: Secondary | ICD-10-CM | POA: Diagnosis not present

## 2016-07-28 DIAGNOSIS — Z5181 Encounter for therapeutic drug level monitoring: Secondary | ICD-10-CM | POA: Insufficient documentation

## 2016-07-28 DIAGNOSIS — M47812 Spondylosis without myelopathy or radiculopathy, cervical region: Secondary | ICD-10-CM | POA: Insufficient documentation

## 2016-07-28 MED ORDER — MEPERIDINE HCL 50 MG PO TABS: 50.0000 mg | ORAL_TABLET | Freq: Three times a day (TID) | ORAL | 0 refills | Status: DC

## 2016-07-28 NOTE — Progress Notes (Signed)
Subjective:    Patient ID: Jenna [name], female    DOB: 15-Jun-1933, 82 y.o.   MRN: [id number]  HPI: [name] is a 82 year old female who returns for follow up for chronic pain and medication refill. She states her pain is located in her  lower back and has increased in intensity with activity. Lumbar X-ray ordered, with increase intensity of pain she has remained on Meperidine TID, will continue at this time. awaiting X-ray results She rates her pain 3. Her current exercise regime is walking in her home.  Pain Inventory Average Pain 4 Pain Right Now 3 My pain is intermittent and aching  In the last 24 hours, has pain interfered with the following? General activity 3 Relation with others 1 Enjoyment of life 3 What TIME of day is your pain at its worst? daytime, evening, night Sleep (in general) Fair  Pain is worse with: walking, sitting, standing and some activites Pain improves with: rest and medication Relief from Meds: 6  Mobility walk without assistance how many minutes can you walk? 10-15 ability to climb steps?  yes do you drive?  yes Do you have any goals in this area?  yes  Function retired Do you have any goals in this area?  no  Neuro/Psych trouble walking  Prior Studies Any changes since last visit?  no  Physicians involved in your care Any changes since last visit?  no   Family History  Problem Relation Age of Onset  . Stroke Mother   . Heart attack Father   . Diabetes Brother   . Heart disease Brother    Social History   Social History  . Marital status: Married    Spouse name: N/A  . Number of children: N/A  . Years of education: N/A   Social History Main Topics  . Smoking status: Former Smoker    Packs/day: 1.00    Years: 15.00  . Smokeless tobacco: Never Used     Comment: quit smoking in 1985  . Alcohol use 3.6 oz/week    6 Standard drinks or equivalent per week     Comment: occ wine w/ supper  . Drug use: No  .  Sexual activity: Yes    Partners: Male    Birth control/ protection: Surgical     Comment: hysterectomy   Other Topics Concern  . None   Social History Narrative  . None   Past Surgical History:  Procedure Laterality Date  . ABDOMINAL HYSTERECTOMY  1982   BSO  . ANTERIOR CERVICAL DECOMP/DISCECTOMY FUSION N/A 10/03/2014   Procedure: ANTERIOR CERVICAL DECOMPRESSION/DISCECTOMY FUSION 1 LEVEL,CERVICAL THREE-FOUR;  Surgeon: [name], MD;  Location: [facility];  Service: Neurosurgery;  Laterality: N/A;  . APPENDECTOMY  1986   along with chole  . BREAST BIOPSY Right 1997   sclerosing adenosis -Dr. Lucia Gaskins  . CHOLECYSTECTOMY    . COLONOSCOPY    . ESOPHAGOGASTRODUODENOSCOPY     with dilitation  . LIPOMA EXCISION Left 10/1999   left thigh  . lumbosacral cyst  2005   seen on injection for low back pain   . LUNG BIOPSY  05/2001   /w Dr.Burney- bronchoscopy, mediastinoscopy  . REVERSE SHOULDER ARTHROPLASTY Left 02/01/2016   Procedure: LEFT REVERSE TOTAL SHOULDER ARTHROPLASTY;  Surgeon: [name], MD;  Location: [facility];  Service: Orthopedics;  Laterality: Left;  . rotator cuff  Bilateral 1996/1998   repair  . ROTATOR CUFF REPAIR Left 1996; 1981  .  ROTATOR CUFF REPAIR Right 1996  . TONSILLECTOMY    . TOTAL HIP ARTHROPLASTY Left 10/2000  . TOTAL HIP ARTHROPLASTY Right 06/2005  . TOTAL HIP ARTHROPLASTY Left    10/2000  . VAGINAL DELIVERY     x1   Past Medical History:  Diagnosis Date  . Asthmatic bronchitis   . Cataracts, bilateral    immature  . Chronic back pain    arthritis.Stenosis  . Dysphagia   . Dysrhythmia    (?or extra beats) treated /w atenolol  . Emphysema lung ([place])    left  . GERD (gastroesophageal reflux disease)    takes Omeprazole daily  . Hard of hearing    wears hearing aides   . History of blood transfusion    no abnormal reaction noted  . History of gastric ulcer   . Hypercholesteremia    takes Atorvastatin daily  . Hypertension    takes Coreg  and Losartan daily  . Hypothyroidism    takes Synthroid daily  . IBS (irritable bowel syndrome)    takes Librax daily  . Joint pain   . Lumbosacral spondylosis without myelopathy   . Melanoma ([place])    on left leg, wide excision   . Muscle spasm    takes Robaxin daily as needed  . Neuromuscular disorder ([place])    esophageal spasms at times   . Osteoarthritis   . Peripheral edema    take HCTZ daily  . Pneumonia    hx of.Many yrs ago  . 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 Seasonal allergies    takes Allegra daily and uses Flonase daily as needed  . Shortness of breath dyspnea    with exertion  . Venous stasis   . Yeast infection    given Diflucan today   BP (!) 155/65 (BP Location: Left Arm, Patient Position: Sitting, Cuff Size: Large)   Pulse 71   Resp 14   LMP 11/03/1980 (Approximate)   SpO2 96%   Opioid Risk Score:   Fall Risk Score:  `1  Depression screen PHQ 2/9  Depression screen [facility] 2/9 12/13/2015 01/11/2015  Decreased Interest 0 0  Down, Depressed, Hopeless 0 0  PHQ - 2 Score 0 0  Altered sleeping - 0  Tired, decreased energy - 1  Change in appetite - 0  Feeling bad or failure about yourself  - 0  Trouble concentrating - 1  Moving slowly or fidgety/restless - 0  Suicidal thoughts - 0  PHQ-9 Score - 2    Review of Systems  Constitutional: Negative.   HENT: Negative.   Eyes: Negative.   Respiratory: Negative.   Cardiovascular: Negative.   Gastrointestinal: Negative.   Endocrine: Negative.   Genitourinary: Negative.   Musculoskeletal: Positive for back pain and gait problem.  Skin: Negative.   Allergic/Immunologic: Negative.   Hematological: Negative.   Psychiatric/Behavioral: Negative.   All other systems reviewed and are negative.      Objective:   Physical Exam  Constitutional: She is oriented to person, place, and time. She appears well-developed and well-nourished.  HENT:  Head:  Normocephalic and atraumatic.  Neck: Normal range of motion. Neck supple.  Cardiovascular: Normal rate and regular rhythm.   Pulmonary/Chest: Effort normal and breath sounds normal.  Musculoskeletal:  Normal Muscle Bulk and Muscle Testing Reveals:  Upper Extremities: Right: Full ROM and Muscle Strength 5/5 Left: Decreased ROM: 90 Degrees and Muscle Strength 5/5 Lumbar Paraspinal Tenderness: L-3-  L-5 Lower Extremities: Full ROM and Muscle Strength 5/5 Arises from table with ease Narrow Based Gait  Neurological: She is alert and oriented to person, place, and time.  Skin: Skin is warm and dry.  Psychiatric: She has a normal mood and affect.  Nursing note and vitals reviewed.         Assessment & Plan:  1.Chronic low back pain with lumbar spondylosis.  Refilled: Meperidine 50 mg one tablet three times a day #90.  We will continue the opioid monitoring program,this consists of regular clinic visits, examinations, urine drug screen, pill counts as well as use of [place] Controlled Substance reporting System. 2. Lumbar Radiculitis: No complaints today. Continue to monitoe 3. Cervical stenosis with radiculitis : S/P Anterior Cervical Fusion C3-C4 with Dr. Ellene Route on 10/03/2014 4. Chronic bilateral shoulder pain L>R: Continue with Current medication regime. S/P Left Reverse Total Shoulder Arthroplasty on 02/01/16 via Dr. Veverly Fells  20 minutes of face to face patient care time was spent during this visit. All questions was encouraged and answered.   F/U in 1 month

## 2016-07-29 ENCOUNTER — Telehealth: Payer: Self-pay | Admitting: Physical Medicine & Rehabilitation

## 2016-07-29 NOTE — Telephone Encounter (Signed)
Patient would like for Zella Ball to call her back, she has changed her mind about Xray's.

## 2016-07-29 NOTE — Telephone Encounter (Signed)
Return Jenna [name] call, she would like to have her Lumbar X-ray performed at [facility]. She is aware the order has been placed for [place], she verbalizes understanding.

## 2016-08-04 ENCOUNTER — Encounter: Payer: Self-pay | Admitting: Nurse Practitioner

## 2016-08-05 ENCOUNTER — Ambulatory Visit (INDEPENDENT_AMBULATORY_CARE_PROVIDER_SITE_OTHER): Payer: Medicare Other | Admitting: Nurse Practitioner

## 2016-08-05 ENCOUNTER — Encounter: Payer: Self-pay | Admitting: Nurse Practitioner

## 2016-08-05 VITALS — BP 120/70 | HR 76 | Resp 16 | Ht 61.75 in | Wt 146.0 lb

## 2016-08-05 DIAGNOSIS — Z1211 Encounter for screening for malignant neoplasm of colon: Secondary | ICD-10-CM | POA: Diagnosis not present

## 2016-08-05 DIAGNOSIS — D1720 Benign lipomatous neoplasm of skin and subcutaneous tissue of unspecified limb: Secondary | ICD-10-CM

## 2016-08-05 DIAGNOSIS — Z79890 Hormone replacement therapy (postmenopausal): Secondary | ICD-10-CM

## 2016-08-05 DIAGNOSIS — Z01419 Encounter for gynecological examination (general) (routine) without abnormal findings: Secondary | ICD-10-CM

## 2016-08-05 MED ORDER — ESTROGENS, CONJUGATED 0.625 MG/GM VA CREA: TOPICAL_CREAM | VAGINAL | 3 refills | Status: DC

## 2016-08-05 MED ORDER — ESTRADIOL 0.025 MG/24HR TD PTTW: MEDICATED_PATCH | TRANSDERMAL | 4 refills | Status: DC

## 2016-08-05 NOTE — Progress Notes (Signed)
82 y.o. G75P1001 Married  Caucasian Fe here for annual exam.  Had reverse left shoulder replacement 02/01/2016 and did very well.  She will be having X ray of lower back on Thursday.  Patient's last menstrual period was 11/03/1980 (approximate).          Sexually active: Yes.    The current method of family planning is status post hysterectomy.    Exercising: Yes.    walking Smoker:  no  Health Maintenance: Pap:  2002 per patient chart, no report MMG:  04/10/16 BIRADS1 negative Colonoscopy:  06/2003, spoke with GI and due to inability to swallow prep pt no longer getting colonoscopy, but has been advised to continue IFOB testing BMD:   02/2013,  T-Score 2.9 Spine / 0.0 Left Radius TDaP:  PCP Shingles: 06/2013 Pneumonia:  Prevnar 13 last November Labs: PCP maintains all labs and urine   reports that she has quit smoking. She has a 15.00 pack-year smoking history. She has never used smokeless tobacco. She reports that she drinks about 3.6 oz of alcohol per week . She reports that she does not use drugs.  Past Medical History:  Diagnosis Date  . Asthmatic bronchitis   . Cataracts, bilateral    immature  . Chronic back pain    arthritis.Stenosis  . Dysphagia   . Dysrhythmia    (?or extra beats) treated /w atenolol  . Emphysema lung ([place])    left  . GERD (gastroesophageal reflux disease)    takes Omeprazole daily  . Hard of hearing    wears hearing aides   . History of blood transfusion    no abnormal reaction noted  . History of gastric ulcer   . Hypercholesteremia    takes Atorvastatin daily  . Hypertension    takes Coreg and Losartan daily  . Hypothyroidism    takes Synthroid daily  . IBS (irritable bowel syndrome)    takes Librax daily  . Joint pain   . Lumbosacral spondylosis without myelopathy   . Melanoma ([place])    on left leg, wide excision   . Muscle spasm    takes Robaxin daily as needed  . Neuromuscular disorder ([place])    esophageal spasms at times   .  Osteoarthritis   . Peripheral edema    take HCTZ daily  . Pneumonia    hx of.Many yrs ago  . 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 Seasonal allergies    takes Allegra daily and uses Flonase daily as needed  . Shortness of breath dyspnea    with exertion  . Venous stasis   . Yeast infection    given Diflucan today    Past Surgical History:  Procedure Laterality Date  . ABDOMINAL HYSTERECTOMY  1982   BSO  . ANTERIOR CERVICAL DECOMP/DISCECTOMY FUSION N/A 10/03/2014   Procedure: ANTERIOR CERVICAL DECOMPRESSION/DISCECTOMY FUSION 1 LEVEL,CERVICAL THREE-FOUR;  Surgeon: [name], MD;  Location: [facility];  Service: Neurosurgery;  Laterality: N/A;  . APPENDECTOMY  1986   along with chole  . BREAST BIOPSY Right 1997   sclerosing adenosis -Dr. Lucia Gaskins  . CHOLECYSTECTOMY    . COLONOSCOPY    . ESOPHAGOGASTRODUODENOSCOPY     with dilitation  . LIPOMA EXCISION Left 10/1999   left thigh  . lumbosacral cyst  2005   seen on injection for low back pain   . LUNG BIOPSY  05/2001   /w Dr.Burney- bronchoscopy, mediastinoscopy  . REVERSE  SHOULDER ARTHROPLASTY Left 02/01/2016   Procedure: LEFT REVERSE TOTAL SHOULDER ARTHROPLASTY;  Surgeon: [name], MD;  Location: [facility];  Service: Orthopedics;  Laterality: Left;  . rotator cuff  Bilateral 1996/1998   repair  . ROTATOR CUFF REPAIR Left 1996; 1981  . ROTATOR CUFF REPAIR Right 1996  . TONSILLECTOMY    . TOTAL HIP ARTHROPLASTY Left 10/2000  . TOTAL HIP ARTHROPLASTY Right 06/2005  . TOTAL HIP ARTHROPLASTY Left    10/2000  . VAGINAL DELIVERY     x1    Current Outpatient Prescriptions  Medication Sig Dispense Refill  . aspirin 81 MG tablet Take 81 mg by mouth daily.     [name] atorvastatin (LIPITOR) 80 MG tablet Take 40 mg by mouth daily.    . calcium carbonate (OS-CAL) 600 MG TABS Take 600 mg by mouth 2 (two) times daily.    . carvedilol (COREG) 12.5 MG tablet Take 12.5 mg by mouth 2 (two)  times daily with a meal.    . cholecalciferol (VITAMIN D) 1000 UNITS tablet Take 1,000 Units by mouth daily.    . clidinium-chlordiazePOXIDE (LIBRAX) 5-2.5 MG per capsule Take 1 capsule by mouth 3 (three) times daily as needed (for IBS).    [name] conjugated estrogens (PREMARIN) vaginal cream Use 1/2 g vaginally twice weekly (Patient taking differently: Place 1 Applicatorful vaginally once a week. ) 42.5 g 3  . diazepam (VALIUM) 5 MG tablet Take 5 mg by mouth at bedtime as needed for sedation.     [name] estradiol (VIVELLE-DOT) 0.025 MG/24HR APPLY 1 PATCH TO SKIN TWICE WEEKLY. 12 patch 0  . fexofenadine (ALLEGRA) 180 MG tablet Take 180 mg by mouth daily.    . fluconazole (DIFLUCAN) 150 MG tablet Take 1 tablet (150 mg total) by mouth once. Repeat in 72 hours if symptoms persist. 2 tablet 0  . fluticasone (FLONASE) 50 MCG/ACT nasal spray Place 2 sprays into both nostrils daily as needed for allergies.     . hydrochlorothiazide (MICROZIDE) 12.5 MG capsule Take 1 capsule (12.5 mg total) by mouth daily. 30 capsule 0  . hyoscyamine (LEVSIN SL) 0.125 MG SL tablet Place 0.125-0.25 mg under the tongue every 4 (four) hours as needed for cramping.     [name] ibuprofen (ADVIL,MOTRIN) 200 MG tablet Take 400 mg by mouth every 6 (six) hours as needed (for pain).     [name] levothyroxine (SYNTHROID, LEVOTHROID) 112 MCG tablet Take 112 mcg by mouth daily before breakfast.    . losartan (COZAAR) 100 MG tablet Take 1 tablet (100 mg total) by mouth daily. 30 tablet 0  . meperidine (DEMEROL) 50 MG tablet Take 1 tablet (50 mg total) by mouth 3 (three) times daily. 90 tablet 0  . methocarbamol (ROBAXIN) 500 MG tablet Take 1 tablet (500 mg total) by mouth every 6 (six) hours as needed for muscle spasms. 60 tablet 0  . Multiple Vitamins-Minerals (CENTRUM SILVER PO) Take by mouth.    . naphazoline (NAPHCON) 0.1 % ophthalmic solution Place 1 drop into both eyes daily as needed for irritation.    [name] omeprazole (PRILOSEC) 40 MG capsule Take 40 mg  by mouth 2 (two) times daily.     . promethazine (PHENERGAN) 25 MG tablet TAKE 1 TABLET BY MOUTH TWICE DAILY AS NEEDED. (Patient taking differently: TAKE 1 TABLET BY MOUTH TWICE DAILY AS NEEDED for nausea and vomiting) 60 tablet 11   No current facility-administered medications for this visit.     Family History  Problem Relation Age of  Onset  . Stroke Mother   . Heart attack Father   . Diabetes Brother   . Heart disease Brother     ROS:  Pertinent items are noted in HPI.  Otherwise, a comprehensive ROS was negative.  Exam:   LMP 11/03/1980 (Approximate)    Ht Readings from Last 3 Encounters:  02/01/16 5' 2.5" (1.588 m)  01/22/16 5' 2.5" (1.588 m)  07/11/15 5\' 2"  (1.575 m)    General appearance: alert, cooperative and appears stated age Head: Normocephalic, without obvious abnormality, atraumatic Neck: no adenopathy, supple, symmetrical, trachea midline and thyroid normal to inspection and palpation Lungs: clear to auscultation bilaterally Breasts: normal appearance, no masses or tenderness Heart: regular rate and rhythm Abdomen: soft, non-tender; no masses,  no organomegaly Extremities: extremities normal, atraumatic, no cyanosis or edema Skin: Skin color, texture, turgor normal. No rashes or lesions Lymph nodes: Cervical, supraclavicular, and axillary nodes normal. No abnormal inguinal nodes palpated Neurologic: Grossly normal   Pelvic: External genitalia:  no lesions              Urethra:  normal appearing urethra with no masses, tenderness or lesions              Bartholin's and Skene's: normal                 Vagina: normal appearing vagina with normal color and discharge, no lesions              Cervix: absent              Pap taken: No. Bimanual Exam:  Uterus:  uterus absent              Adnexa: no mass, fullness, tenderness               Rectovaginal: Confirms               Anus:  normal sphincter tone, no lesions  Chaperone present: yes  A:  Well Woman with  normal exam    S/P TAH/ BSO secondary to fibroids on ERT 1982 trying to taper dose Chronic neck pain - DDD seeing pain specialist  IBS, hypercholesterolemia, HTN, hypothyroid             Lipoma left axilla  S/P left shoulder reverse replacement 02/01/16   P:   Reviewed health and wellness pertinent to exam  Pap smear as above  Mammogram is due 04/2017  Refill on ERT and vaginal estrogen - using only once a week.  Counseled with risk of DVT, CVA, cancer, etc.  Counseled on breast self exam, mammography screening, use and side effects of HRT, adequate intake of calcium and vitamin D, diet and exercise return annually or prn  An After Visit Summary was printed and given to the patient.

## 2016-08-05 NOTE — Patient Instructions (Signed)

## 2016-08-06 MED ORDER — ESTROGENS, CONJUGATED 0.625 MG/GM VA CREA: TOPICAL_CREAM | VAGINAL | 3 refills | Status: DC

## 2016-08-07 ENCOUNTER — Telehealth: Payer: Self-pay | Admitting: Registered Nurse

## 2016-08-07 ENCOUNTER — Ambulatory Visit
Admission: RE | Admit: 2016-08-07 | Discharge: 2016-08-07 | Disposition: A | Discharge status: Home / Self Care | Payer: Medicare Other | Source: Ambulatory Visit | Attending: Registered Nurse | Admitting: Registered Nurse

## 2016-08-07 DIAGNOSIS — G8929 Other chronic pain: Secondary | ICD-10-CM

## 2016-08-07 DIAGNOSIS — M545 Low back pain: Principal | ICD-10-CM

## 2016-08-07 NOTE — Telephone Encounter (Signed)
Lumbar Xray ordered.

## 2016-08-08 NOTE — Progress Notes (Signed)
Encounter reviewed by Dr. Aundria Rud. Consider discontinuation of ERT.

## 2016-08-11 ENCOUNTER — Telehealth: Payer: Self-pay | Admitting: Registered Nurse

## 2016-08-11 NOTE — Telephone Encounter (Signed)
Placed a call to Ms. Carone, Reviewed Lumbar X-rays results. She verbalizes understanding

## 2016-08-12 ENCOUNTER — Telehealth: Payer: Self-pay | Admitting: Registered Nurse

## 2016-08-12 NOTE — Telephone Encounter (Signed)
Placed a call to Mrs. Segarra, spoke to her husband he will have her return the call. I spoke with Dr. Letta Pate regarding her Lumbar X-rays. Dr. Letta Pate agrees with Facet Injection. Will await Mrs. Gruenberg return to call.

## 2016-08-13 ENCOUNTER — Telehealth: Payer: Self-pay | Admitting: Physical Medicine & Rehabilitation

## 2016-08-13 NOTE — Telephone Encounter (Signed)
Patient returning Jenna [name] phone call.

## 2016-08-14 ENCOUNTER — Telehealth: Payer: Self-pay

## 2016-08-14 ENCOUNTER — Telehealth: Payer: Self-pay | Admitting: Registered Nurse

## 2016-08-14 NOTE — Telephone Encounter (Signed)
PA denied by insurance for Estradiol patch. Called the pharmacy to see how much the patient would have to pay out of pocket. Cost would be $78.89 for 1 month. Patient is aware and does not wish to pay that amount. Per patient she has enough to last her for 4 weeks and can wait until PG returns to discuss other options than the patch.

## 2016-08-14 NOTE — Telephone Encounter (Signed)
Spoke with Jenna [name] regarding her X-ray,  I had spoke with Dr. Letta Pate and he agrees with Facet Injection, she agrees with plan. Office staff scheduling appointment.

## 2016-08-14 NOTE — Telephone Encounter (Signed)
Return Ms. Causby call, no answer. Left message to return the call.

## 2016-08-14 NOTE — Telephone Encounter (Signed)
Routing to Kem Boroughs, FNP for review and advise upon her return.  Encounter previously closed.

## 2016-08-20 NOTE — Telephone Encounter (Signed)
After review of her chart - this pt has been encouraged to taper off ERT anyway.  The oral dose of Estrdiol at 0.5 mg is twice the dose I want her to have.  If she were on the patch 0.025mg   she could still taper off at 1 patch every 4 days for a month, then every 6 days for a month and then off.  See if she is receptive to this idea and since tapering over the cooler months she may do OK.

## 2016-08-20 NOTE — Telephone Encounter (Signed)
Spoke with patient. Advised of message as seen below from Kem Boroughs, [place]. Patient states she has decreased to using 1 patch every 7 days. Has around 4 patches left from her current refill. Advised to continue using 1 patch per 7 days for 1 month and then stop use. Patient is agreeable and verbalizes understanding. She will return call if she has any questions or concerns.  Routing to provider for final review. Patient agreeable to disposition. Will close encounter.

## 2016-08-26 ENCOUNTER — Ambulatory Visit: Payer: Medicare Other | Admitting: Registered Nurse

## 2016-08-29 ENCOUNTER — Encounter: Payer: Self-pay | Admitting: Physical Medicine & Rehabilitation

## 2016-08-29 ENCOUNTER — Encounter: Payer: Medicare Other | Attending: Physical Medicine and Rehabilitation

## 2016-08-29 ENCOUNTER — Ambulatory Visit (HOSPITAL_BASED_OUTPATIENT_CLINIC_OR_DEPARTMENT_OTHER): Payer: Medicare Other | Admitting: Physical Medicine & Rehabilitation

## 2016-08-29 VITALS — BP 147/81 | HR 68

## 2016-08-29 DIAGNOSIS — M12811 Other specific arthropathies, not elsewhere classified, right shoulder: Secondary | ICD-10-CM | POA: Diagnosis present

## 2016-08-29 DIAGNOSIS — Z5181 Encounter for therapeutic drug level monitoring: Secondary | ICD-10-CM | POA: Insufficient documentation

## 2016-08-29 DIAGNOSIS — M47816 Spondylosis without myelopathy or radiculopathy, lumbar region: Secondary | ICD-10-CM | POA: Insufficient documentation

## 2016-08-29 DIAGNOSIS — M47817 Spondylosis without myelopathy or radiculopathy, lumbosacral region: Secondary | ICD-10-CM

## 2016-08-29 DIAGNOSIS — Z79899 Other long term (current) drug therapy: Secondary | ICD-10-CM | POA: Diagnosis present

## 2016-08-29 DIAGNOSIS — M12812 Other specific arthropathies, not elsewhere classified, left shoulder: Secondary | ICD-10-CM | POA: Diagnosis present

## 2016-08-29 DIAGNOSIS — M47812 Spondylosis without myelopathy or radiculopathy, cervical region: Secondary | ICD-10-CM | POA: Insufficient documentation

## 2016-08-29 MED ORDER — MEPERIDINE HCL 50 MG PO TABS: 50.0000 mg | ORAL_TABLET | Freq: Three times a day (TID) | ORAL | 0 refills | Status: DC

## 2016-08-29 NOTE — Progress Notes (Signed)
Bilateral Lumbar L3, L4  medial branch blocks and L 5 dorsal ramus injection under fluoroscopic guidance  Indication: Lumbar pain which is not relieved by medication management or other conservative care and interfering with self-care and mobility.  Informed consent was obtained after describing risks and benefits of the procedure with the patient, this includes bleeding, infection, paralysis and medication side effects.  The patient wishes to proceed and has given written consent.  The patient was placed in prone position.  The lumbar area was marked and prepped with Betadine.  One mL of 1% lidocaine was injected into each of 6 areas into the skin and subcutaneous tissue.  Then a 22-gauge 3.5in spinal needle was inserted targeting the junction of the left S1 superior articular process and sacral ala junction. Needle was advanced under fluoroscopic guidance.  Bone contact was made.  Omnipaque 180 was injected x 0.5 mL demonstrating no intravascular uptake.  Then a solution containing one mL of 4 mg per mL dexamethasone and 3 mL of 2% MPF lidocaine was injected x 0.5 mL.  Then the left L5 superior articular process in transverse process junction was targeted.  Bone contact was made.  Omnipaque 180 was injected x 0.5 mL demonstrating no intravascular uptake. Then a solution containing one mL of 4 mg per mL dexamethasone and 3 mL of 2% MPF lidocaine was injected x 0.5 mL.  Then the left L4 superior articular process in transverse process junction was targeted.  Bone contact was made.  Omnipaque 180 was injected x 0.5 mL demonstrating no intravascular uptake.  Then a solution containing one mL of 4 mg per mL dexamethasone and 3 mL if 2% MPF lidocaine was injected x 0.5 mL.  This same procedure was performed on the right side using the same needle, technique and injectate.  Patient tolerated procedure well.  Post procedure instructions were given.

## 2016-08-29 NOTE — Progress Notes (Signed)
  PROCEDURE RECORD [facility]   Name: Jenna [name] DOB:Jul 30, 1933 MRN: [id number]  Date:08/29/2016  Physician: Alysia Penna, MD    Nurse/CMA: Yasamin Karel, CMA  Allergies:  Allergies  Allergen Reactions  . Codeine Shortness Of Breath  . Pseudoephedrine Hcl Shortness Of Breath  . Tylenol [Acetaminophen] Shortness Of Breath and Palpitations  . Ultram [Tramadol] Shortness Of Breath  . Vicodin [Hydrocodone-Acetaminophen] Shortness Of Breath  . Dalmane [Flurazepam Hcl] Other (See Comments)    confusion  . Cephalexin Nausea Only  . Clarithromycin Nausea Only  . Vibramycin [Doxycycline Calcium] Rash    Consent Signed: Yes.    Is patient diabetic? No.  CBG today?   Pregnant: No. LMP: Patient's last menstrual period was 11/03/1980 (approximate). (age 46-55)  Anticoagulants: no Anti-inflammatory: no Antibiotics: no  Procedure: medial branch block  Position: Prone Start Time: 2:36pm  End Time: 2:47pm  Fluoro Time: 29  RN/CMA [place], CMA Hillel Card, CMA    Time 2:00pm 2:55pm    BP 147/81 172/80    Pulse 68 71    Respirations 14 14    O2 Sat 98 96    S/S 6 6    Pain Level 4/10 0/10     D/C home with husband, [name], D/C instructions reviewed, and sits independently.

## 2016-09-01 NOTE — Addendum Note (Signed)
Addended by: Caro Hight on: 09/01/2016 03:41 PM   Modules accepted: Orders

## 2016-09-01 NOTE — Addendum Note (Signed)
Addended by: Elinor Parkinson I on: 09/01/2016 03:21 PM   Modules accepted: Orders

## 2016-09-08 LAB — TOXASSURE SELECT,+ANTIDEPR,UR

## 2016-09-08 NOTE — Progress Notes (Signed)
Urine drug screen for this encounter is consistent for prescribed medication 

## 2016-09-23 ENCOUNTER — Ambulatory Visit: Payer: Medicare Other | Admitting: Physical Medicine & Rehabilitation

## 2016-09-30 ENCOUNTER — Ambulatory Visit (HOSPITAL_BASED_OUTPATIENT_CLINIC_OR_DEPARTMENT_OTHER): Payer: Medicare Other | Admitting: Physical Medicine & Rehabilitation

## 2016-09-30 ENCOUNTER — Encounter: Payer: Medicare Other | Attending: Physical Medicine and Rehabilitation

## 2016-09-30 DIAGNOSIS — Z79899 Other long term (current) drug therapy: Secondary | ICD-10-CM | POA: Diagnosis present

## 2016-09-30 DIAGNOSIS — M12812 Other specific arthropathies, not elsewhere classified, left shoulder: Secondary | ICD-10-CM | POA: Insufficient documentation

## 2016-09-30 DIAGNOSIS — M47812 Spondylosis without myelopathy or radiculopathy, cervical region: Secondary | ICD-10-CM | POA: Diagnosis present

## 2016-09-30 DIAGNOSIS — M12811 Other specific arthropathies, not elsewhere classified, right shoulder: Secondary | ICD-10-CM | POA: Insufficient documentation

## 2016-09-30 DIAGNOSIS — M47816 Spondylosis without myelopathy or radiculopathy, lumbar region: Secondary | ICD-10-CM | POA: Diagnosis not present

## 2016-09-30 DIAGNOSIS — Z5181 Encounter for therapeutic drug level monitoring: Secondary | ICD-10-CM | POA: Insufficient documentation

## 2016-09-30 MED ORDER — MEPERIDINE HCL 50 MG PO TABS: 50.0000 mg | ORAL_TABLET | Freq: Three times a day (TID) | ORAL | 0 refills | Status: DC

## 2016-09-30 NOTE — Progress Notes (Signed)
Subjective:    Patient ID: Jenna [name], female    DOB: July 22, 1933, 82 y.o.   MRN: [id number]  HPI   82 year old female with chronic low back pain who has previously had a very prolonged relief with lumbar medial branch blocks. More recently, she's had a good but temporary relief. No significant lower extremity pain. No falls or other trauma.  Bilateral MBB 08/29/16 100% relief for 4-5Days  Prior B MBB Right > Left side low back pain Pain Inventory Average Pain 4 Pain Right Now 3 My pain is aching  In the last 24 hours, has pain interfered with the following? General activity 4 Relation with others 1 Enjoyment of life 3 What TIME of day is your pain at its worst? evening and night Sleep (in general) Fair  Pain is worse with: sitting and standing Pain improves with: rest and medication Relief from Meds: 6  Mobility walk without assistance how many minutes can you walk? 15-20 ability to climb steps?  yes do you drive?  yes  Function retired  Neuro/Psych No problems in this area  Prior Studies Any changes since last visit?  no  Physicians involved in your care Any changes since last visit?  no   Family History  Problem Relation Age of Onset  . Stroke Mother   . Heart attack Father   . Diabetes Brother   . Heart disease Brother    Social History   Social History  . Marital status: Married    Spouse name: N/A  . Number of children: N/A  . Years of education: N/A   Social History Main Topics  . Smoking status: Former Smoker    Packs/day: 1.00    Years: 15.00  . Smokeless tobacco: Never Used     Comment: quit smoking in 1985  . Alcohol use 3.6 oz/week    6 Standard drinks or equivalent per week     Comment: occ wine w/ supper  . Drug use: No  . Sexual activity: Yes    Partners: Male    Birth control/ protection: Surgical     Comment: hysterectomy   Other Topics Concern  . Not on file   Social History Narrative  . No narrative on file     Past Surgical History:  Procedure Laterality Date  . ABDOMINAL HYSTERECTOMY  1982   BSO  . ANTERIOR CERVICAL DECOMP/DISCECTOMY FUSION N/A 10/03/2014   Procedure: ANTERIOR CERVICAL DECOMPRESSION/DISCECTOMY FUSION 1 LEVEL,CERVICAL THREE-FOUR;  Surgeon: [name], MD;  Location: [facility];  Service: Neurosurgery;  Laterality: N/A;  . APPENDECTOMY  1986   along with chole  . BREAST BIOPSY Right 1997   sclerosing adenosis -Dr. Lucia Gaskins  . CHOLECYSTECTOMY    . COLONOSCOPY    . ESOPHAGOGASTRODUODENOSCOPY     with dilitation  . LIPOMA EXCISION Left 10/1999   left thigh  . lumbosacral cyst  2005   seen on injection for low back pain   . LUNG BIOPSY  05/2001   /w Dr.Burney- bronchoscopy, mediastinoscopy  . REVERSE SHOULDER ARTHROPLASTY Left 02/01/2016   Procedure: LEFT REVERSE TOTAL SHOULDER ARTHROPLASTY;  Surgeon: [name], MD;  Location: [facility];  Service: Orthopedics;  Laterality: Left;  . rotator cuff  Bilateral 1996/1998   repair  . ROTATOR CUFF REPAIR Left 1996; 1981  . ROTATOR CUFF REPAIR Right 1996  . TONSILLECTOMY    . TOTAL HIP ARTHROPLASTY Left 10/2000  . TOTAL HIP ARTHROPLASTY Right 06/2005  . TOTAL HIP ARTHROPLASTY Left  10/2000  . VAGINAL DELIVERY     x1   Past Medical History:  Diagnosis Date  . Asthmatic bronchitis   . Cataracts, bilateral    immature  . Chronic back pain    arthritis.Stenosis  . Dysphagia   . Dysrhythmia    (?or extra beats) treated /w atenolol  . Emphysema lung ([place])    left  . GERD (gastroesophageal reflux disease)    takes Omeprazole daily  . Hard of hearing    wears hearing aides   . History of blood transfusion    no abnormal reaction noted  . History of gastric ulcer   . Hypercholesteremia    takes Atorvastatin daily  . Hypertension    takes Coreg and Losartan daily  . Hypothyroidism    takes Synthroid daily  . IBS (irritable bowel syndrome)    takes Librax daily  . Joint pain   . Lumbosacral spondylosis without  myelopathy   . Melanoma ([place])    on left leg, wide excision   . Muscle spasm    takes Robaxin daily as needed  . Neuromuscular disorder ([place])    esophageal spasms at times   . Osteoarthritis   . Peripheral edema    take HCTZ daily  . Pneumonia    hx of.Many yrs ago  . 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 Seasonal allergies    takes Allegra daily and uses Flonase daily as needed  . Shortness of breath dyspnea    with exertion  . Venous stasis   . Yeast infection    given Diflucan today   LMP 11/03/1980 (Approximate)   Opioid Risk Score:   Fall Risk Score:  `1  Depression screen PHQ 2/9  Depression screen [facility] 2/9 09/30/2016 12/13/2015 01/11/2015  Decreased Interest 0 0 0  Down, Depressed, Hopeless 0 0 0  PHQ - 2 Score 0 0 0  Altered sleeping - - 0  Tired, decreased energy - - 1  Change in appetite - - 0  Feeling bad or failure about yourself  - - 0  Trouble concentrating - - 1  Moving slowly or fidgety/restless - - 0  Suicidal thoughts - - 0  PHQ-9 Score - - 2    Review of Systems  Constitutional: Negative.   HENT: Negative.   Eyes: Negative.   Respiratory: Negative.   Cardiovascular: Negative.   Gastrointestinal: Negative.   Endocrine: Negative.   Genitourinary: Negative.   Musculoskeletal: Negative.   Skin: Negative.   Allergic/Immunologic: Negative.   Neurological: Negative.   Hematological: Negative.   Psychiatric/Behavioral: Negative.   All other systems reviewed and are negative.      Objective:   Physical Exam  Constitutional: She is oriented to person, place, and time. She appears well-developed and well-nourished.  HENT:  Head: Normocephalic and atraumatic.  Eyes: Conjunctivae and EOM are normal. Pupils are equal, round, and reactive to light.  Neurological: She is alert and oriented to person, place, and time.  Psychiatric: She has a normal mood and affect.  Nursing note and vitals  reviewed. . Lumbar spine range of motion 75% for flexion, less than 25%. Extension less than 25%. Lateral bending to the right and to the left. Tenderness. Palpation lumbosacral junction paraspinals, otherwise nontender. No evidence of deformity. Gait is normal  Negative straight leg raise      Assessment & Plan:  1. Chronic low back pain secondary to lumbar spondylosis without myelopathy.  She's had good but temporary relief on 2 occasions recently of medial branch blocks at bilateral L3, L4 and bilateral L5 dorsal ramus injections under fluoroscopic guidance.  Would recommend lumbar radiofrequency neurotomy, L3, L4 medial branch, L5 dorsal ramus starting on the right side. Then 1 month later performing left side. We discussed the pros and cons of the procedure including risks and benefits. Patient would like to have this done after the holidays. She will schedule this at the next patient visit.  Continue Demerol 50 kg 3 times a day. Hopefully radiofrequency would allow some weaning down to 50 mg twice a day

## 2016-09-30 NOTE — Patient Instructions (Signed)
We discussed lumbar radiofrequency procedure. This is a procedure that can last 12 months. Usually is done, one side at a time. In your case, it would be the right side first. I would recommend physical therapy, either prior to doing the procedure or afterwards. Range of motion of the spine appears to be decreasing

## 2016-10-15 ENCOUNTER — Encounter: Payer: Self-pay | Admitting: *Deleted

## 2016-10-24 ENCOUNTER — Telehealth: Payer: Self-pay | Admitting: Nurse Practitioner

## 2016-10-24 NOTE — Telephone Encounter (Signed)
Patient called says she got a letter that we never received her hemoccult kit.  Patient states that she mailed it to [place] about 2 weeks ago.  Would like another one sent to her.

## 2016-10-31 ENCOUNTER — Encounter: Payer: Self-pay | Admitting: Registered Nurse

## 2016-10-31 ENCOUNTER — Encounter: Payer: Medicare Other | Attending: Physical Medicine and Rehabilitation | Admitting: Registered Nurse

## 2016-10-31 VITALS — BP 168/82 | HR 77 | Resp 14

## 2016-10-31 DIAGNOSIS — M47817 Spondylosis without myelopathy or radiculopathy, lumbosacral region: Secondary | ICD-10-CM | POA: Diagnosis not present

## 2016-10-31 DIAGNOSIS — G894 Chronic pain syndrome: Secondary | ICD-10-CM

## 2016-10-31 DIAGNOSIS — M5416 Radiculopathy, lumbar region: Secondary | ICD-10-CM | POA: Diagnosis not present

## 2016-10-31 DIAGNOSIS — M12811 Other specific arthropathies, not elsewhere classified, right shoulder: Secondary | ICD-10-CM | POA: Insufficient documentation

## 2016-10-31 DIAGNOSIS — Z5181 Encounter for therapeutic drug level monitoring: Secondary | ICD-10-CM | POA: Diagnosis present

## 2016-10-31 DIAGNOSIS — M47812 Spondylosis without myelopathy or radiculopathy, cervical region: Secondary | ICD-10-CM | POA: Diagnosis present

## 2016-10-31 DIAGNOSIS — Z79899 Other long term (current) drug therapy: Secondary | ICD-10-CM | POA: Insufficient documentation

## 2016-10-31 DIAGNOSIS — M47816 Spondylosis without myelopathy or radiculopathy, lumbar region: Secondary | ICD-10-CM | POA: Insufficient documentation

## 2016-10-31 DIAGNOSIS — M12812 Other specific arthropathies, not elsewhere classified, left shoulder: Secondary | ICD-10-CM | POA: Diagnosis present

## 2016-10-31 MED ORDER — MEPERIDINE HCL 50 MG PO TABS: 50.0000 mg | ORAL_TABLET | Freq: Three times a day (TID) | ORAL | 0 refills | Status: DC

## 2016-10-31 NOTE — Telephone Encounter (Signed)
Left detailed message for patient at number provided [phone], okay per ROI. Advised patient RN will place a new IFOB kit at the front desk for patient pick up at her earliest convenience (reviewed with Lamont Snowball, RN). Advised unable to mail as we want to ensure the solution in the kit does not get too cold or hot. Advised to return call to the office with any further questions or concerns. IFOB kit to the front desk for patient pick up.  Routing to provider for final review. Patient agreeable to disposition. Will close encounter.

## 2016-10-31 NOTE — Progress Notes (Signed)
Subjective:    Patient ID: Jenna [name], female    DOB: [birth year], 82 y.o.   MRN: [id number]  HPI: [name] is a 82 year old female who returns for follow up appointment for chronic pain and medication refill. She states her pain is located in her lower back. She rates her pain 2. Her current exercise regime is walking in her home.  Pain Inventory Average Pain 4 Pain Right Now 2 My pain is intermittent, stabbing and aching  In the last 24 hours, has pain interfered with the following? General activity 4 Relation with others 1 Enjoyment of life 3 What TIME of day is your pain at its worst? daytime, evening Sleep (in general) Good  Pain is worse with: standing and some activites Pain improves with: rest, pacing activities and medication Relief from Meds: 6  Mobility walk without assistance how many minutes can you walk? 15 ability to climb steps?  yes do you drive?  yes Do you have any goals in this area?  yes  Function retired Do you have any goals in this area?  no  Neuro/Psych No problems in this area  Prior Studies Any changes since last visit?  no  Physicians involved in your care Any changes since last visit?  no   Family History  Problem Relation Age of Onset  . Stroke Mother   . Heart attack Father   . Diabetes Brother   . Heart disease Brother    Social History   Social History  . Marital status: Married    Spouse name: N/A  . Number of children: N/A  . Years of education: N/A   Social History Main Topics  . Smoking status: Former Smoker    Packs/day: 1.00    Years: 15.00  . Smokeless tobacco: Never Used     Comment: quit smoking in 1985  . Alcohol use 3.6 oz/week    6 Standard drinks or equivalent per week     Comment: occ wine w/ supper  . Drug use: No  . Sexual activity: Yes    Partners: Male    Birth control/ protection: Surgical     Comment: hysterectomy   Other Topics Concern  . None   Social History  Narrative  . None   Past Surgical History:  Procedure Laterality Date  . ABDOMINAL HYSTERECTOMY  1982   BSO  . ANTERIOR CERVICAL DECOMP/DISCECTOMY FUSION N/A 10/03/2014   Procedure: ANTERIOR CERVICAL DECOMPRESSION/DISCECTOMY FUSION 1 LEVEL,CERVICAL THREE-FOUR;  Surgeon: [name], MD;  Location: [facility];  Service: Neurosurgery;  Laterality: N/A;  . APPENDECTOMY  1986   along with chole  . BREAST BIOPSY Right 1997   sclerosing adenosis -Dr. Lucia Gaskins  . CHOLECYSTECTOMY    . COLONOSCOPY    . ESOPHAGOGASTRODUODENOSCOPY     with dilitation  . LIPOMA EXCISION Left 10/1999   left thigh  . lumbosacral cyst  2005   seen on injection for low back pain   . LUNG BIOPSY  05/2001   /w Dr.Burney- bronchoscopy, mediastinoscopy  . REVERSE SHOULDER ARTHROPLASTY Left 02/01/2016   Procedure: LEFT REVERSE TOTAL SHOULDER ARTHROPLASTY;  Surgeon: [name], MD;  Location: [facility];  Service: Orthopedics;  Laterality: Left;  . rotator cuff  Bilateral 1996/1998   repair  . ROTATOR CUFF REPAIR Left 1996; 1981  . ROTATOR CUFF REPAIR Right 1996  . TONSILLECTOMY    . TOTAL HIP ARTHROPLASTY Left 10/2000  . TOTAL HIP ARTHROPLASTY Right 06/2005  .  TOTAL HIP ARTHROPLASTY Left    10/2000  . VAGINAL DELIVERY     x1   Past Medical History:  Diagnosis Date  . Asthmatic bronchitis   . Cataracts, bilateral    immature  . Chronic back pain    arthritis.Stenosis  . Dysphagia   . Dysrhythmia    (?or extra beats) treated /w atenolol  . Emphysema lung ([place])    left  . GERD (gastroesophageal reflux disease)    takes Omeprazole daily  . Hard of hearing    wears hearing aides   . History of blood transfusion    no abnormal reaction noted  . History of gastric ulcer   . Hypercholesteremia    takes Atorvastatin daily  . Hypertension    takes Coreg and Losartan daily  . Hypothyroidism    takes Synthroid daily  . IBS (irritable bowel syndrome)    takes Librax daily  . Joint pain   . Lumbosacral  spondylosis without myelopathy   . Melanoma ([place])    on left leg, wide excision   . Muscle spasm    takes Robaxin daily as needed  . Neuromuscular disorder ([place])    esophageal spasms at times   . Osteoarthritis   . Peripheral edema    take HCTZ daily  . Pneumonia    hx of.Many yrs ago  . 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 Seasonal allergies    takes Allegra daily and uses Flonase daily as needed  . Shortness of breath dyspnea    with exertion  . Venous stasis   . Yeast infection    given Diflucan today   BP (!) 168/82   Pulse 77   Resp 14   LMP 11/03/1980 (Approximate)   SpO2 96%   Opioid Risk Score:   Fall Risk Score:  `1  Depression screen PHQ 2/9  Depression screen [facility] 2/9 09/30/2016 12/13/2015 01/11/2015  Decreased Interest 0 0 0  Down, Depressed, Hopeless 0 0 0  PHQ - 2 Score 0 0 0  Altered sleeping - - 0  Tired, decreased energy - - 1  Change in appetite - - 0  Feeling bad or failure about yourself  - - 0  Trouble concentrating - - 1  Moving slowly or fidgety/restless - - 0  Suicidal thoughts - - 0  PHQ-9 Score - - 2    Review of Systems  Constitutional: Negative.   HENT: Negative.   Eyes: Negative.   Respiratory: Negative.   Cardiovascular: Negative.   Gastrointestinal: Negative.   Endocrine: Negative.   Genitourinary: Negative.   Musculoskeletal: Positive for arthralgias and back pain.  Skin: Negative.   Allergic/Immunologic: Negative.   Hematological: Negative.   Psychiatric/Behavioral: Negative.   All other systems reviewed and are negative.      Objective:   Physical Exam  Constitutional: She appears well-developed and well-nourished.  HENT:  Head: Normocephalic and atraumatic.  Neck: Normal range of motion. Neck supple.  Cardiovascular: Normal rate and regular rhythm.   Pulmonary/Chest: Effort normal and breath sounds normal.  Nursing note and vitals reviewed.           Assessment & Plan:  1.Chronic low back pain with lumbar spondylosis.  Refilled: Meperidine 50 mg one tablet three times a day #90.  We will continue the opioid monitoring program,this consists of regular clinic visits, examinations, urine drug screen, pill counts as well as use of [place] Controlled Substance  reporting System. 2. Lumbar Radiculitis: No complaints today. Continue to monitor 3. Cervical stenosis with radiculitis : S/P Anterior Cervical Fusion C3-C4 with Dr. Ellene Route on 10/03/2014 4. Chronic bilateral shoulder pain L>R: Continue with Current medication regime. S/P Left Reverse Total Shoulder Arthroplasty on 02/01/16 via Dr. Veverly Fells  20 minutes of face to face patient care time was spent during this visit. All questions was encouraged and answered.   F/U in 1 month

## 2016-11-24 ENCOUNTER — Encounter: Payer: Self-pay | Admitting: Registered Nurse

## 2016-11-24 ENCOUNTER — Encounter: Payer: Medicare Other | Attending: Physical Medicine and Rehabilitation | Admitting: Registered Nurse

## 2016-11-24 VITALS — BP 137/84 | HR 76 | Resp 14

## 2016-11-24 DIAGNOSIS — M47816 Spondylosis without myelopathy or radiculopathy, lumbar region: Secondary | ICD-10-CM | POA: Diagnosis not present

## 2016-11-24 DIAGNOSIS — Z5181 Encounter for therapeutic drug level monitoring: Secondary | ICD-10-CM | POA: Insufficient documentation

## 2016-11-24 DIAGNOSIS — G894 Chronic pain syndrome: Secondary | ICD-10-CM

## 2016-11-24 DIAGNOSIS — M47817 Spondylosis without myelopathy or radiculopathy, lumbosacral region: Secondary | ICD-10-CM

## 2016-11-24 DIAGNOSIS — M12812 Other specific arthropathies, not elsewhere classified, left shoulder: Secondary | ICD-10-CM | POA: Insufficient documentation

## 2016-11-24 DIAGNOSIS — M12811 Other specific arthropathies, not elsewhere classified, right shoulder: Secondary | ICD-10-CM | POA: Insufficient documentation

## 2016-11-24 DIAGNOSIS — Z79899 Other long term (current) drug therapy: Secondary | ICD-10-CM

## 2016-11-24 DIAGNOSIS — M47812 Spondylosis without myelopathy or radiculopathy, cervical region: Secondary | ICD-10-CM | POA: Diagnosis present

## 2016-11-24 DIAGNOSIS — Z981 Arthrodesis status: Secondary | ICD-10-CM

## 2016-11-24 MED ORDER — MEPERIDINE HCL 50 MG PO TABS: 50.0000 mg | ORAL_TABLET | Freq: Three times a day (TID) | ORAL | 0 refills | Status: DC

## 2016-11-24 NOTE — Progress Notes (Signed)
Subjective:    Patient ID: Jenna [name], female    DOB: September 26, 1933, 83 y.o.   MRN: [id number]  HPI: Jenna [name] is a 83 year old female who returns for follow up appointment for chronic pain and medication refill. She states her pain is located in her lower back. She rates her pain 2. Her current exercise regime is walking in her home and using the Treadmill in her home QOD for 15 minutes.  Pain Inventory Average Pain 3 Pain Right Now 2 My pain is intermittent, sharp and aching  In the last 24 hours, has pain interfered with the following? General activity 3 Relation with others 1 Enjoyment of life 3 What TIME of day is your pain at its worst? Daytime,evening Sleep (in general) Good  Pain is worse with: standing and some activites Pain improves with: rest, pacing activities and medication Relief from Meds: 7  Mobility walk without assistance how many minutes can you walk? 15 ability to climb steps?  yes do you drive?  yes Do you have any goals in this area?  yes  Function retired Do you have any goals in this area?  no  Neuro/Psych No problems in this area  Prior Studies Any changes since last visit?  no  Physicians involved in your care Any changes since last visit?  no   Family History  Problem Relation Age of Onset  . Stroke Mother   . Heart attack Father   . Diabetes Brother   . Heart disease Brother    Social History   Social History  . Marital status: Married    Spouse name: N/A  . Number of children: N/A  . Years of education: N/A   Social History Main Topics  . Smoking status: Former Smoker    Packs/day: 1.00    Years: 15.00  . Smokeless tobacco: Never Used     Comment: quit smoking in 1985  . Alcohol use 3.6 oz/week    6 Standard drinks or equivalent per week     Comment: occ wine w/ supper  . Drug use: No  . Sexual activity: Yes    Partners: Male    Birth control/ protection: Surgical     Comment: hysterectomy   Other  Topics Concern  . None   Social History Narrative  . None   Past Surgical History:  Procedure Laterality Date  . ABDOMINAL HYSTERECTOMY  1982   BSO  . ANTERIOR CERVICAL DECOMP/DISCECTOMY FUSION N/A 10/03/2014   Procedure: ANTERIOR CERVICAL DECOMPRESSION/DISCECTOMY FUSION 1 LEVEL,CERVICAL THREE-FOUR;  Surgeon: [name], MD;  Location: [facility];  Service: Neurosurgery;  Laterality: N/A;  . APPENDECTOMY  1986   along with chole  . BREAST BIOPSY Right 1997   sclerosing adenosis -Dr. Lucia Gaskins  . CHOLECYSTECTOMY    . COLONOSCOPY    . ESOPHAGOGASTRODUODENOSCOPY     with dilitation  . LIPOMA EXCISION Left 10/1999   left thigh  . lumbosacral cyst  2005   seen on injection for low back pain   . LUNG BIOPSY  05/2001   /w Dr.Burney- bronchoscopy, mediastinoscopy  . REVERSE SHOULDER ARTHROPLASTY Left 02/01/2016   Procedure: LEFT REVERSE TOTAL SHOULDER ARTHROPLASTY;  Surgeon: [name], MD;  Location: [facility];  Service: Orthopedics;  Laterality: Left;  . rotator cuff  Bilateral 1996/1998   repair  . ROTATOR CUFF REPAIR Left 1996; 1981  . ROTATOR CUFF REPAIR Right 1996  . TONSILLECTOMY    . TOTAL HIP ARTHROPLASTY Left  10/2000  . TOTAL HIP ARTHROPLASTY Right 06/2005  . TOTAL HIP ARTHROPLASTY Left    10/2000  . VAGINAL DELIVERY     x1   Past Medical History:  Diagnosis Date  . Asthmatic bronchitis   . Cataracts, bilateral    immature  . Chronic back pain    arthritis.Stenosis  . Dysphagia   . Dysrhythmia    (?or extra beats) treated /w atenolol  . Emphysema lung ([place])    left  . GERD (gastroesophageal reflux disease)    takes Omeprazole daily  . Hard of hearing    wears hearing aides   . History of blood transfusion    no abnormal reaction noted  . History of gastric ulcer   . Hypercholesteremia    takes Atorvastatin daily  . Hypertension    takes Coreg and Losartan daily  . Hypothyroidism    takes Synthroid daily  . IBS (irritable bowel syndrome)    takes Librax  daily  . Joint pain   . Lumbosacral spondylosis without myelopathy   . Melanoma ([place])    on left leg, wide excision   . Muscle spasm    takes Robaxin daily as needed  . Neuromuscular disorder ([place])    esophageal spasms at times   . Osteoarthritis   . Peripheral edema    take HCTZ daily  . Pneumonia    hx of.Many yrs ago  . 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 Seasonal allergies    takes Allegra daily and uses Flonase daily as needed  . Shortness of breath dyspnea    with exertion  . Venous stasis   . Yeast infection    given Diflucan today   BP 137/84   Pulse 76   Resp 14   LMP 11/03/1980 (Approximate)   SpO2 97%   Opioid Risk Score:   Fall Risk Score:  `1  Depression screen PHQ 2/9  Depression screen [facility] 2/9 09/30/2016 12/13/2015 01/11/2015  Decreased Interest 0 0 0  Down, Depressed, Hopeless 0 0 0  PHQ - 2 Score 0 0 0  Altered sleeping - - 0  Tired, decreased energy - - 1  Change in appetite - - 0  Feeling bad or failure about yourself  - - 0  Trouble concentrating - - 1  Moving slowly or fidgety/restless - - 0  Suicidal thoughts - - 0  PHQ-9 Score - - 2    Review of Systems  Constitutional: Negative.   HENT: Negative.   Eyes: Negative.   Respiratory: Negative.   Cardiovascular: Negative.   Gastrointestinal: Negative.   Endocrine: Negative.   Genitourinary: Negative.   Musculoskeletal: Negative.   Skin: Negative.   Allergic/Immunologic: Negative.   Neurological: Negative.   Hematological: Negative.   Psychiatric/Behavioral: Negative.   All other systems reviewed and are negative.      Objective:   Physical Exam  Constitutional: She is oriented to person, place, and time. She appears well-developed and well-nourished.  HENT:  Head: Normocephalic and atraumatic.  Neck: Normal range of motion. Neck supple.  Cardiovascular: Normal rate and regular rhythm.   Pulmonary/Chest: Effort normal and breath  sounds normal.  Musculoskeletal:  Normal Muscle Bulk and Muscle Testing Reveals: Upper Extremities: Right: Full ROM and Muscle Strength 5/5 Left: Decreased ROM 90 Degrees and Muscle Strength 5/5 Lumbar Paraspinal Tenderness: L-3-L-5 Lower Extremities: Full ROM and Muscle Strength 5/5 Arises from table with ease Narrow Based Gait  Neurological: She is alert and oriented to person, place, and time.  Skin: Skin is warm and dry.  Psychiatric: She has a normal mood and affect.  Nursing note and vitals reviewed.         Assessment & Plan:  1.Chronic low back pain with lumbar spondylosis.  Refilled: Meperidine 50 mg one tablet three times a day #90.  We will continue the opioid monitoring program,this consists of regular clinic visits, examinations, urine drug screen, pill counts as well as use of [place] Controlled Substance reporting System. 2. Lumbar Radiculitis: No complaints today. Continue to monitor 3. Cervical stenosis with radiculitis : S/P Anterior Cervical Fusion C3-C4 with Dr. Ellene Route on 10/03/2014 4. Chronic bilateral shoulder pain L>R: Continue with Current medication regime. S/P Left Reverse Total Shoulder Arthroplasty on 02/01/16 via Dr. Veverly Fells  20 minutes of face to face patient care time was spent during this visit. All questions was encouraged and answered.   F/U in 1 month

## 2016-11-30 IMAGING — DX DG SHOULDER 1V*L*
1 series · 1 of 1 positions shown · non-contrast
Comparison: 11/14/2015

CLINICAL DATA: Postop

EXAM:
LEFT SHOULDER - 1 VIEW

[shoulder ap]
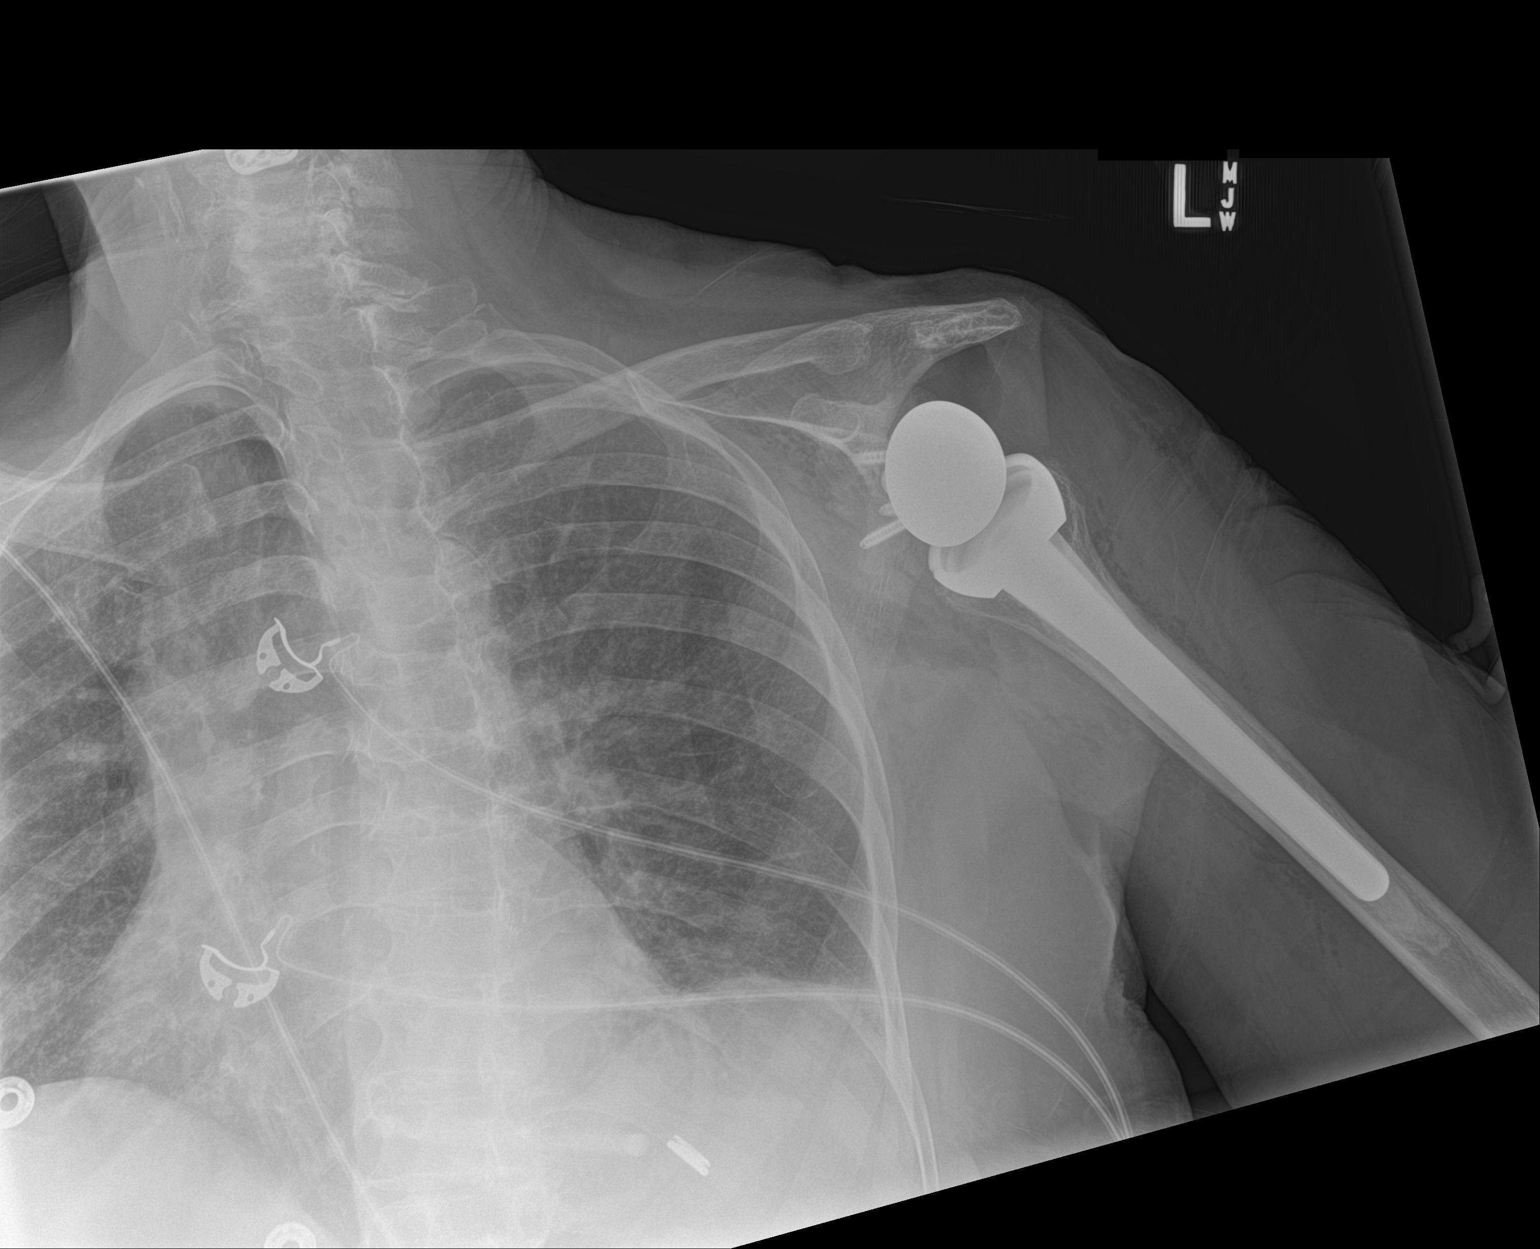

[1 of 1 positions shown; findings below may reference images not displayed]

FINDINGS: Total shoulder arthroplasty is in place. No breakage or loosening of
the hardware. Anatomic alignment.
IMPRESSION: Left total shoulder arthroplasty anatomically aligned.

## 2016-12-09 LAB — FECAL OCCULT BLOOD, IMMUNOCHEMICAL: IMMUNOLOGICAL FECAL OCCULT BLOOD TEST: NEGATIVE

## 2016-12-09 NOTE — Addendum Note (Signed)
Addended by: Graylon Good on: 12/09/2016 03:34 PM   Modules accepted: Orders

## 2016-12-23 ENCOUNTER — Other Ambulatory Visit: Payer: Self-pay | Admitting: Registered Nurse

## 2016-12-30 ENCOUNTER — Encounter: Payer: Medicare Other | Attending: Physical Medicine and Rehabilitation | Admitting: Registered Nurse

## 2016-12-30 ENCOUNTER — Encounter: Payer: Self-pay | Admitting: Registered Nurse

## 2016-12-30 VITALS — BP 129/83 | HR 66

## 2016-12-30 DIAGNOSIS — M12811 Other specific arthropathies, not elsewhere classified, right shoulder: Secondary | ICD-10-CM | POA: Insufficient documentation

## 2016-12-30 DIAGNOSIS — M5416 Radiculopathy, lumbar region: Secondary | ICD-10-CM

## 2016-12-30 DIAGNOSIS — G894 Chronic pain syndrome: Secondary | ICD-10-CM | POA: Diagnosis not present

## 2016-12-30 DIAGNOSIS — M47812 Spondylosis without myelopathy or radiculopathy, cervical region: Secondary | ICD-10-CM | POA: Diagnosis present

## 2016-12-30 DIAGNOSIS — Z981 Arthrodesis status: Secondary | ICD-10-CM

## 2016-12-30 DIAGNOSIS — M12812 Other specific arthropathies, not elsewhere classified, left shoulder: Secondary | ICD-10-CM | POA: Insufficient documentation

## 2016-12-30 DIAGNOSIS — M47816 Spondylosis without myelopathy or radiculopathy, lumbar region: Secondary | ICD-10-CM | POA: Diagnosis not present

## 2016-12-30 DIAGNOSIS — Z5181 Encounter for therapeutic drug level monitoring: Secondary | ICD-10-CM | POA: Diagnosis present

## 2016-12-30 DIAGNOSIS — Z79899 Other long term (current) drug therapy: Secondary | ICD-10-CM

## 2016-12-30 MED ORDER — MEPERIDINE HCL 50 MG PO TABS: 50.0000 mg | ORAL_TABLET | Freq: Three times a day (TID) | ORAL | 0 refills | Status: DC

## 2016-12-30 NOTE — Progress Notes (Signed)
Subjective:    Patient ID: Jenna [name], female    DOB: Jul 26, 1933, 83 y.o.   MRN: [id number]  HPI:  [name] is a 83 year old female who returns for follow up appointmentfor chronic pain and medication refill. She states her pain is located in her lower back mainly right side. She rates her pain 3. Her current exercise regime is walking in her home and using the Treadmill in her home QOD for 15 minutes.  Pain Inventory Average Pain 4 Pain Right Now 3 My pain is intermittent, sharp and aching  In the last 24 hours, has pain interfered with the following? General activity 4 Relation with others 2 Enjoyment of life 5 What TIME of day is your pain at its worst? daytime and evening Sleep (in general) Fair  Pain is worse with: sitting, standing and some activites Pain improves with: rest, pacing activities and medication Relief from Meds: 6  Mobility walk without assistance how many minutes can you walk? 10-15 ability to climb steps?  yes do you drive?  yes Do you have any goals in this area?  yes  Function retired Do you have any goals in this area?  no  Neuro/Psych No problems in this area  Prior Studies Any changes since last visit?  no  Physicians involved in your care Any changes since last visit?  no   Family History  Problem Relation Age of Onset  . Stroke Mother   . Heart attack Father   . Diabetes Brother   . Heart disease Brother    Social History   Social History  . Marital status: Married    Spouse name: N/A  . Number of children: N/A  . Years of education: N/A   Social History Main Topics  . Smoking status: Former Smoker    Packs/day: 1.00    Years: 15.00  . Smokeless tobacco: Never Used     Comment: quit smoking in 1985  . Alcohol use 3.6 oz/week    6 Standard drinks or equivalent per week     Comment: occ wine w/ supper  . Drug use: No  . Sexual activity: Yes    Partners: Male    Birth control/ protection: Surgical       Comment: hysterectomy   Other Topics Concern  . Not on file   Social History Narrative  . No narrative on file   Past Surgical History:  Procedure Laterality Date  . ABDOMINAL HYSTERECTOMY  1982   BSO  . ANTERIOR CERVICAL DECOMP/DISCECTOMY FUSION N/A 10/03/2014   Procedure: ANTERIOR CERVICAL DECOMPRESSION/DISCECTOMY FUSION 1 LEVEL,CERVICAL THREE-FOUR;  Surgeon: [name], MD;  Location: [facility];  Service: Neurosurgery;  Laterality: N/A;  . APPENDECTOMY  1986   along with chole  . BREAST BIOPSY Right 1997   sclerosing adenosis -Dr. Lucia Gaskins  . CHOLECYSTECTOMY    . COLONOSCOPY    . ESOPHAGOGASTRODUODENOSCOPY     with dilitation  . LIPOMA EXCISION Left 10/1999   left thigh  . lumbosacral cyst  2005   seen on injection for low back pain   . LUNG BIOPSY  05/2001   /w Dr.Burney- bronchoscopy, mediastinoscopy  . REVERSE SHOULDER ARTHROPLASTY Left 02/01/2016   Procedure: LEFT REVERSE TOTAL SHOULDER ARTHROPLASTY;  Surgeon: [name], MD;  Location: [facility];  Service: Orthopedics;  Laterality: Left;  . rotator cuff  Bilateral 1996/1998   repair  . ROTATOR CUFF REPAIR Left 1996; 1981  . ROTATOR CUFF REPAIR Right  [place]    . TOTAL HIP ARTHROPLASTY Left 10/2000  . TOTAL HIP ARTHROPLASTY Right 06/2005  . TOTAL HIP ARTHROPLASTY Left    10/2000  . VAGINAL DELIVERY     x1   Past Medical History:  Diagnosis Date  . Asthmatic bronchitis   . Cataracts, bilateral    immature  . Chronic back pain    arthritis.Stenosis  . Dysphagia   . Dysrhythmia    (?or extra beats) treated /w atenolol  . Emphysema lung ([place])    left  . GERD (gastroesophageal reflux disease)    takes Omeprazole daily  . Hard of hearing    wears hearing aides   . History of blood transfusion    no abnormal reaction noted  . History of gastric ulcer   . Hypercholesteremia    takes Atorvastatin daily  . Hypertension    takes Coreg and Losartan daily  . Hypothyroidism    takes Synthroid  daily  . IBS (irritable bowel syndrome)    takes Librax daily  . Joint pain   . Lumbosacral spondylosis without myelopathy   . Melanoma ([place])    on left leg, wide excision   . Muscle spasm    takes Robaxin daily as needed  . Neuromuscular disorder ([place])    esophageal spasms at times   . Osteoarthritis   . Peripheral edema    take HCTZ daily  . Pneumonia    hx of.Many yrs ago  . 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 Seasonal allergies    takes Allegra daily and uses Flonase daily as needed  . Shortness of breath dyspnea    with exertion  . Venous stasis   . Yeast infection    given Diflucan today   LMP 11/03/1980 (Approximate)   Opioid Risk Score:   Fall Risk Score:  `1  Depression screen PHQ 2/9  Depression screen [facility] 2/9 09/30/2016 12/13/2015 01/11/2015  Decreased Interest 0 0 0  Down, Depressed, Hopeless 0 0 0  PHQ - 2 Score 0 0 0  Altered sleeping - - 0  Tired, decreased energy - - 1  Change in appetite - - 0  Feeling bad or failure about yourself  - - 0  Trouble concentrating - - 1  Moving slowly or fidgety/restless - - 0  Suicidal thoughts - - 0  PHQ-9 Score - - 2    Review of Systems  Constitutional: Negative.   HENT: Negative.   Eyes: Negative.   Respiratory: Negative.   Cardiovascular: Negative.   Gastrointestinal: Negative.   Endocrine: Negative.   Genitourinary: Negative.   Musculoskeletal: Positive for back pain.  Skin: Negative.   Allergic/Immunologic: Negative.   Neurological: Negative.   Hematological: Negative.   Psychiatric/Behavioral: Negative.        Objective:   Physical Exam  Constitutional: She is oriented to person, place, and time. She appears well-developed and well-nourished.  HENT:  Head: Normocephalic and atraumatic.  Neck: Normal range of motion. Neck supple.  Cardiovascular: Normal rate and regular rhythm.   Pulmonary/Chest: Effort normal and breath sounds normal.    Musculoskeletal:  Normal Muscle Bulk and Muscle Testing Reveals: Upper Extremities: Right: Full ROM and Muscle Strength 5/5 Left: Decreased ROM 90 Degrees and Muscle Strength 5/5 Lumbar Paraspinal Tenderness: L-3- L-5 Lower Extremities: Full ROM and Muscle Strength 5/5 Arises from Table with ease Narrow Based Gait  Neurological: She is alert and oriented to  person, place, and time.  Skin: Skin is warm and dry.  Psychiatric: She has a normal mood and affect.  Nursing note and vitals reviewed.         Assessment & Plan:  1.Chronic low back pain with lumbar spondylosis. 12/30/2016 Refilled: Meperidine 50 mg one tablet three times a day #90.  We will continue the opioid monitoring program,this consists of regular clinic visits, examinations, urine drug screen, pill counts as well as use of [place] Controlled Substance reporting System. 2. Lumbar Radiculitis: Schedule for Radiofrequency with Dr. Letta Pate 3. Cervical stenosis with radiculitis : S/P Anterior Cervical Fusion C3-C4 with Dr. Ellene Route on 10/03/2014. 12/30/2016 4. Chronic bilateral shoulder pain L>R: Continue with Current medication regime. S/P Left Reverse Total Shoulder Arthroplasty on 02/01/16 via Dr. Veverly Fells. 12/30/2016  20 minutes of face to face patient care time was spent during this visit. All questions were encouraged and answered.  F/U in 1 month

## 2016-12-31 ENCOUNTER — Telehealth: Payer: Self-pay | Admitting: Registered Nurse

## 2016-12-31 NOTE — Telephone Encounter (Signed)
On 12/31/2016 the [place] was reviewed no conflict was seen on the [place] with multiple prescribers. Jenna [name] has a signed narcotic contract with our office. If there were any discrepancies this would have been reported to her physician.

## 2017-01-04 LAB — TOXASSURE SELECT,+ANTIDEPR,UR

## 2017-01-23 ENCOUNTER — Encounter: Payer: Medicare Other | Attending: Physical Medicine and Rehabilitation

## 2017-01-23 ENCOUNTER — Ambulatory Visit (HOSPITAL_BASED_OUTPATIENT_CLINIC_OR_DEPARTMENT_OTHER): Payer: Medicare Other | Admitting: Physical Medicine & Rehabilitation

## 2017-01-23 ENCOUNTER — Encounter: Payer: Self-pay | Admitting: Physical Medicine & Rehabilitation

## 2017-01-23 VITALS — BP 145/69 | HR 66 | Resp 14

## 2017-01-23 DIAGNOSIS — Z79899 Other long term (current) drug therapy: Secondary | ICD-10-CM | POA: Insufficient documentation

## 2017-01-23 DIAGNOSIS — M12811 Other specific arthropathies, not elsewhere classified, right shoulder: Secondary | ICD-10-CM | POA: Insufficient documentation

## 2017-01-23 DIAGNOSIS — Z5181 Encounter for therapeutic drug level monitoring: Secondary | ICD-10-CM | POA: Insufficient documentation

## 2017-01-23 DIAGNOSIS — M12812 Other specific arthropathies, not elsewhere classified, left shoulder: Secondary | ICD-10-CM | POA: Insufficient documentation

## 2017-01-23 DIAGNOSIS — M47812 Spondylosis without myelopathy or radiculopathy, cervical region: Secondary | ICD-10-CM | POA: Insufficient documentation

## 2017-01-23 DIAGNOSIS — M47816 Spondylosis without myelopathy or radiculopathy, lumbar region: Secondary | ICD-10-CM | POA: Insufficient documentation

## 2017-01-23 DIAGNOSIS — M47817 Spondylosis without myelopathy or radiculopathy, lumbosacral region: Secondary | ICD-10-CM

## 2017-01-23 MED ORDER — MEPERIDINE HCL 50 MG PO TABS: 50.0000 mg | ORAL_TABLET | Freq: Three times a day (TID) | ORAL | 0 refills | Status: DC

## 2017-01-23 NOTE — Progress Notes (Signed)
  PROCEDURE RECORD [facility]   Name: Jenna [name] DOB:07-23-1933 MRN: [id number]  Date:01/23/2017  Physician: Alysia Penna, MD    Nurse/CMA: Damian Hofstra, CMA  Allergies:  Allergies  Allergen Reactions  . Codeine Shortness Of Breath  . Pseudoephedrine Hcl Shortness Of Breath  . Tylenol [Acetaminophen] Shortness Of Breath and Palpitations  . Ultram [Tramadol] Shortness Of Breath  . Vicodin [Hydrocodone-Acetaminophen] Shortness Of Breath  . [place] [Flurazepam Hcl] Other (See Comments)    confusion  . Cephalexin Nausea Only  . Clarithromycin Nausea Only  . Vibramycin [Doxycycline Calcium] Rash    Consent Signed: Yes.    Is patient diabetic? Yes.    CBG today?   Pregnant: No. LMP: Patient's last menstrual period was 11/03/1980 (approximate). (age 39-55)  Anticoagulants: no Anti-inflammatory: no Antibiotics: no  Procedure: right radiofrequency neurotomy  Position: Prone Start Time:   End Time:   Fluoro Time:   RN/CMA Baby Stairs, CMA Aadam Zhen, CMA    Time 2:55pm     BP 145/69     Pulse 66     Respirations 14 14    O2 Sat 95     S/S 6 6    Pain Level 4/10      D/C home with husband, [name], D/C instructions reviewed, and sits independently.

## 2017-01-23 NOTE — Progress Notes (Signed)
Patient scheduled for right L3, L4, medial branch L5 /dorsal ramus radiofrequency ablation The fluoroscopy screen malfunctioned prior to initiating the procedure. Could not obtain image. Therefore procedure was not performed. Patient rescheduled  We discussed her medication management, because of pharmacy shortage could not obtain usual Demerol prescription went down to twice a day, we discussed that this was her prior dose and may be she can use this on a more chronic basis. She would first like to see the effect of her medial branch radiofrequency neurotomy

## 2017-02-19 ENCOUNTER — Encounter: Payer: Self-pay | Admitting: Registered Nurse

## 2017-02-19 ENCOUNTER — Ambulatory Visit: Payer: Medicare Other | Admitting: Physical Medicine & Rehabilitation

## 2017-02-19 ENCOUNTER — Encounter: Payer: Medicare Other | Attending: Physical Medicine and Rehabilitation | Admitting: Registered Nurse

## 2017-02-19 VITALS — BP 159/70 | HR 74

## 2017-02-19 DIAGNOSIS — M12812 Other specific arthropathies, not elsewhere classified, left shoulder: Secondary | ICD-10-CM | POA: Insufficient documentation

## 2017-02-19 DIAGNOSIS — G894 Chronic pain syndrome: Secondary | ICD-10-CM | POA: Diagnosis not present

## 2017-02-19 DIAGNOSIS — M12811 Other specific arthropathies, not elsewhere classified, right shoulder: Secondary | ICD-10-CM | POA: Diagnosis present

## 2017-02-19 DIAGNOSIS — M47816 Spondylosis without myelopathy or radiculopathy, lumbar region: Secondary | ICD-10-CM | POA: Insufficient documentation

## 2017-02-19 DIAGNOSIS — M47812 Spondylosis without myelopathy or radiculopathy, cervical region: Secondary | ICD-10-CM | POA: Insufficient documentation

## 2017-02-19 DIAGNOSIS — Z79899 Other long term (current) drug therapy: Secondary | ICD-10-CM | POA: Insufficient documentation

## 2017-02-19 DIAGNOSIS — Z5181 Encounter for therapeutic drug level monitoring: Secondary | ICD-10-CM

## 2017-02-19 DIAGNOSIS — M47817 Spondylosis without myelopathy or radiculopathy, lumbosacral region: Secondary | ICD-10-CM

## 2017-02-19 DIAGNOSIS — M5416 Radiculopathy, lumbar region: Secondary | ICD-10-CM

## 2017-02-19 DIAGNOSIS — Z981 Arthrodesis status: Secondary | ICD-10-CM | POA: Diagnosis not present

## 2017-02-19 MED ORDER — MEPERIDINE HCL 50 MG PO TABS: 50.0000 mg | ORAL_TABLET | Freq: Three times a day (TID) | ORAL | 0 refills | Status: DC

## 2017-02-19 NOTE — Progress Notes (Signed)
Subjective:    Patient ID: Jenna [name], female    DOB: 1933/06/03, 83 y.o.   MRN: [id number]  HPI: Jenna [name] is a 83 year old female who returns for follow up appointmentfor chronic pain and medication refill. She states her pain is located in her lower back. She rates her pain 2. Her current exercise regime is walking in her home and using the Treadmill in her home QOD for 15 minutes.  Last UDS was on 12/30/2016, it was consistent.  Pain Inventory Average Pain 5 Pain Right Now 2 My pain is intermittent and sharp  In the last 24 hours, has pain interfered with the following? General activity 4 Relation with others 1 Enjoyment of life 4 What TIME of day is your pain at its worst? daytime Sleep (in general) Good  Pain is worse with: sitting, standing and some activites Pain improves with: rest, pacing activities and medication Relief from Meds: 6  Mobility walk without assistance ability to climb steps?  yes do you drive?  yes  Function retired  Neuro/Psych No problems in this area  Prior Studies Any changes since last visit?  no  Physicians involved in your care Any changes since last visit?  no   Family History  Problem Relation Age of Onset  . Stroke Mother   . Heart attack Father   . Diabetes Brother   . Heart disease Brother    Social History   Social History  . Marital status: Married    Spouse name: N/A  . Number of children: N/A  . Years of education: N/A   Social History Main Topics  . Smoking status: Former Smoker    Packs/day: 1.00    Years: 15.00  . Smokeless tobacco: Never Used     Comment: quit smoking in 1985  . Alcohol use 3.6 oz/week    6 Standard drinks or equivalent per week     Comment: occ wine w/ supper  . Drug use: No  . Sexual activity: Yes    Partners: Male    Birth control/ protection: Surgical     Comment: hysterectomy   Other Topics Concern  . None   Social History Narrative  . None   Past  Surgical History:  Procedure Laterality Date  . ABDOMINAL HYSTERECTOMY  1982   BSO  . ANTERIOR CERVICAL DECOMP/DISCECTOMY FUSION N/A 10/03/2014   Procedure: ANTERIOR CERVICAL DECOMPRESSION/DISCECTOMY FUSION 1 LEVEL,CERVICAL THREE-FOUR;  Surgeon: [name], MD;  Location: [facility];  Service: Neurosurgery;  Laterality: N/A;  . APPENDECTOMY  1986   along with chole  . BREAST BIOPSY Right 1997   sclerosing adenosis -Dr. Lucia Gaskins  . CHOLECYSTECTOMY    . COLONOSCOPY    . ESOPHAGOGASTRODUODENOSCOPY     with dilitation  . LIPOMA EXCISION Left 10/1999   left thigh  . lumbosacral cyst  2005   seen on injection for low back pain   . LUNG BIOPSY  05/2001   /w Dr.Burney- bronchoscopy, mediastinoscopy  . REVERSE SHOULDER ARTHROPLASTY Left 02/01/2016   Procedure: LEFT REVERSE TOTAL SHOULDER ARTHROPLASTY;  Surgeon: [name], MD;  Location: [facility];  Service: Orthopedics;  Laterality: Left;  . rotator cuff  Bilateral 1996/1998   repair  . ROTATOR CUFF REPAIR Left 1996; 1981  . ROTATOR CUFF REPAIR Right 1996  . TONSILLECTOMY    . TOTAL HIP ARTHROPLASTY Left 10/2000  . TOTAL HIP ARTHROPLASTY Right 06/2005  . TOTAL HIP ARTHROPLASTY Left    10/2000  .  VAGINAL DELIVERY     x1   Past Medical History:  Diagnosis Date  . Asthmatic bronchitis   . Cataracts, bilateral    immature  . Chronic back pain    arthritis.Stenosis  . Dysphagia   . Dysrhythmia    (?or extra beats) treated /w atenolol  . Emphysema lung ([place])    left  . GERD (gastroesophageal reflux disease)    takes Omeprazole daily  . Hard of hearing    wears hearing aides   . History of blood transfusion    no abnormal reaction noted  . History of gastric ulcer   . Hypercholesteremia    takes Atorvastatin daily  . Hypertension    takes Coreg and Losartan daily  . Hypothyroidism    takes Synthroid daily  . IBS (irritable bowel syndrome)    takes Librax daily  . Joint pain   . Lumbosacral spondylosis without myelopathy     . Melanoma ([place])    on left leg, wide excision   . Muscle spasm    takes Robaxin daily as needed  . Neuromuscular disorder ([place])    esophageal spasms at times   . Osteoarthritis   . Peripheral edema    take HCTZ daily  . Pneumonia    hx of.Many yrs ago  . 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 Seasonal allergies    takes Allegra daily and uses Flonase daily as needed  . Shortness of breath dyspnea    with exertion  . Venous stasis   . Yeast infection    given Diflucan today   BP (!) 159/70   Pulse 74   LMP 11/03/1980 (Approximate)   SpO2 97%   Opioid Risk Score:   Fall Risk Score:  `1  Depression screen PHQ 2/9  Depression screen [facility] 2/9 02/19/2017 09/30/2016 12/13/2015 01/11/2015  Decreased Interest 0 0 0 0  Down, Depressed, Hopeless 0 0 0 0  PHQ - 2 Score 0 0 0 0  Altered sleeping - - - 0  Tired, decreased energy - - - 1  Change in appetite - - - 0  Feeling bad or failure about yourself  - - - 0  Trouble concentrating - - - 1  Moving slowly or fidgety/restless - - - 0  Suicidal thoughts - - - 0  PHQ-9 Score - - - 2     Review of Systems  Constitutional: Negative.   HENT: Negative.   Eyes: Negative.   Respiratory: Negative.   Cardiovascular: Negative.   Gastrointestinal: Negative.   Endocrine: Negative.   Genitourinary: Negative.   Musculoskeletal: Negative.   Skin: Negative.   Allergic/Immunologic: Negative.   Neurological: Negative.   Hematological: Negative.   Psychiatric/Behavioral: Negative.   All other systems reviewed and are negative.      Objective:   Physical Exam  Constitutional: She is oriented to person, place, and time. She appears well-developed and well-nourished.  HENT:  Head: Normocephalic and atraumatic.  Neck: Normal range of motion. Neck supple.  Cardiovascular: Normal rate and regular rhythm.   Pulmonary/Chest: Effort normal and breath sounds normal.  Musculoskeletal:  Normal  Muscle Bulk and Muscle Testing Reveals: Upper Extremities: Full ROM and Muscle Strength 5/5 Lumbar Paraspinal Tenderness: L-3-L-5 Lower Extremities: Full ROM and Muscle Strength 5/5 Arises from Chair with ease Narrow Based Gait    Neurological: She is alert and oriented to person, place, and time.  Skin: Skin is warm and  dry.  Psychiatric: She has a normal mood and affect.  Nursing note and vitals reviewed.         Assessment & Plan:  1.Chronic low back pain with lumbar spondylosis. 02/19/2017 Refilled: Meperidine 50 mg one tablet three times a day #90.  We will continue the opioid monitoring program,this consists of regular clinic visits, examinations, urine drug screen, pill counts as well as use of [place] Controlled Substance reporting System. 2. Lumbar Radiculitis: Scheduled for Lumbar Radiofrequency on 03/17/2017 with Dr. Letta Pate. Continue to monitor. 02/19/2017 3. Cervical stenosis with radiculitis : S/P Anterior Cervical Fusion C3-C4 with Dr. Ellene Route on 10/03/2014. 02/19/2017 4. Chronic bilateral shoulder pain L>R: No complaints today.Continue with Current medication regime. 02/19/2017 S/P Left Reverse Total Shoulder Arthroplasty on 02/01/16 via Dr. Veverly Fells  20 minutes of face to face patient care time was spent during this visit. All questions were encouraged and answered.  F/U in 1 month

## 2017-03-17 ENCOUNTER — Encounter: Payer: Medicare Other | Attending: Physical Medicine and Rehabilitation

## 2017-03-17 ENCOUNTER — Ambulatory Visit (HOSPITAL_BASED_OUTPATIENT_CLINIC_OR_DEPARTMENT_OTHER): Payer: Medicare Other | Admitting: Physical Medicine & Rehabilitation

## 2017-03-17 VITALS — BP 148/86 | HR 70

## 2017-03-17 DIAGNOSIS — Z5181 Encounter for therapeutic drug level monitoring: Secondary | ICD-10-CM | POA: Insufficient documentation

## 2017-03-17 DIAGNOSIS — Z79899 Other long term (current) drug therapy: Secondary | ICD-10-CM | POA: Insufficient documentation

## 2017-03-17 DIAGNOSIS — M12811 Other specific arthropathies, not elsewhere classified, right shoulder: Secondary | ICD-10-CM | POA: Insufficient documentation

## 2017-03-17 DIAGNOSIS — M47816 Spondylosis without myelopathy or radiculopathy, lumbar region: Secondary | ICD-10-CM | POA: Insufficient documentation

## 2017-03-17 DIAGNOSIS — M12812 Other specific arthropathies, not elsewhere classified, left shoulder: Secondary | ICD-10-CM | POA: Diagnosis present

## 2017-03-17 DIAGNOSIS — M47817 Spondylosis without myelopathy or radiculopathy, lumbosacral region: Secondary | ICD-10-CM

## 2017-03-17 DIAGNOSIS — M47812 Spondylosis without myelopathy or radiculopathy, cervical region: Secondary | ICD-10-CM | POA: Diagnosis present

## 2017-03-17 MED ORDER — MEPERIDINE HCL 50 MG PO TABS: 50.0000 mg | ORAL_TABLET | Freq: Three times a day (TID) | ORAL | 0 refills | Status: DC

## 2017-03-17 NOTE — Progress Notes (Signed)

## 2017-03-17 NOTE — Patient Instructions (Signed)

## 2017-03-17 NOTE — Progress Notes (Signed)
  PROCEDURE RECORD [facility]   Name: Jenna [name] DOB:[birth year] MRN: [id number]  Date:03/17/2017  Physician: Alysia Penna, MD    Nurse/CMA: Wessling CMA   Allergies:  Allergies  Allergen Reactions  . Codeine Shortness Of Breath  . Pseudoephedrine Hcl Shortness Of Breath  . Tylenol [Acetaminophen] Shortness Of Breath and Palpitations  . Ultram [Tramadol] Shortness Of Breath  . Vicodin [Hydrocodone-Acetaminophen] Shortness Of Breath  . Dalmane [Flurazepam Hcl] Other (See Comments)    confusion  . Cephalexin Nausea Only  . Clarithromycin Nausea Only  . Vibramycin [Doxycycline Calcium] Rash    Consent Signed: Yes.    Is patient diabetic? No.  CBG today? NA  Pregnant: No. LMP: Patient's last menstrual period was 11/03/1980 (approximate). (age 37-55)  Anticoagulants: no Anti-inflammatory: no Antibiotics: no  Procedure: radio frequency Position: Prone Start Time: 1:45pm  End Time: 2:02pm  Fluoro Time: 45  RN/CMA Bright CMA Wessling CMA    Time 116pm 2:09pm    BP 148/86 178/92    Pulse 70 67    Respirations 16 16    O2 Sat 93 92    S/S 6 6    Pain Level 4/10 2/10     D/C home with Bill (Husband), [name], D/C instructions reviewed, and sits independently.

## 2017-03-21 LAB — TOXASSURE SELECT,+ANTIDEPR,UR

## 2017-03-23 ENCOUNTER — Telehealth: Payer: Self-pay | Admitting: *Deleted

## 2017-03-23 NOTE — Telephone Encounter (Signed)
Urine drug screen for this encounter is consistent for prescribed medication.  We are not prescribing valium and by [place] it has not been prescribed in the past year. Absent would be expected.

## 2017-04-28 ENCOUNTER — Encounter: Payer: Medicare Other | Attending: Physical Medicine and Rehabilitation

## 2017-04-28 ENCOUNTER — Ambulatory Visit (HOSPITAL_BASED_OUTPATIENT_CLINIC_OR_DEPARTMENT_OTHER): Payer: Medicare Other | Admitting: Physical Medicine & Rehabilitation

## 2017-04-28 ENCOUNTER — Encounter: Payer: Self-pay | Admitting: Physical Medicine & Rehabilitation

## 2017-04-28 VITALS — BP 150/75 | HR 66

## 2017-04-28 DIAGNOSIS — M47812 Spondylosis without myelopathy or radiculopathy, cervical region: Secondary | ICD-10-CM | POA: Insufficient documentation

## 2017-04-28 DIAGNOSIS — M12812 Other specific arthropathies, not elsewhere classified, left shoulder: Secondary | ICD-10-CM | POA: Diagnosis present

## 2017-04-28 DIAGNOSIS — Z5181 Encounter for therapeutic drug level monitoring: Secondary | ICD-10-CM | POA: Insufficient documentation

## 2017-04-28 DIAGNOSIS — M47816 Spondylosis without myelopathy or radiculopathy, lumbar region: Secondary | ICD-10-CM | POA: Insufficient documentation

## 2017-04-28 DIAGNOSIS — S86302A Unspecified injury of muscle(s) and tendon(s) of peroneal muscle group at lower leg level, left leg, initial encounter: Secondary | ICD-10-CM | POA: Diagnosis not present

## 2017-04-28 DIAGNOSIS — M12811 Other specific arthropathies, not elsewhere classified, right shoulder: Secondary | ICD-10-CM | POA: Insufficient documentation

## 2017-04-28 DIAGNOSIS — Z79899 Other long term (current) drug therapy: Secondary | ICD-10-CM | POA: Diagnosis present

## 2017-04-28 MED ORDER — DICLOFENAC SODIUM 1 % TD GEL: 2.0000 g | Freq: Four times a day (QID) | TRANSDERMAL | 1 refills | Status: DC

## 2017-04-28 MED ORDER — MEPERIDINE HCL 50 MG PO TABS: 50.0000 mg | ORAL_TABLET | Freq: Three times a day (TID) | ORAL | 0 refills | Status: DC

## 2017-04-28 NOTE — Progress Notes (Signed)
Subjective:    Patient ID: Jenna [name], female    DOB: 09/11/1933, 83 y.o.   MRN: [id number]  HPI  83 year old female with lumbar spondylosis without myelopathy. She has had good relief short-term with lumbar medial branch blocks right L3, L4 and right L5 dorsal ramus injection performed on several occasions  03/17/2017, underwent right L3, L4 medial branch and right L5 dorsal ramus radiofrequency neurotomy Preinjection pain levels 5/10, post injection pain level 4/10. Patient's pain is in the same location on the right side.  Occ takes 2 demerol rather than 3 per day No falls, patient has remained independent with all her self-care and mobility. Denies any constipation from her medications. Patient complaining of left lateral ankle pain, mainly with movement. Pain Inventory Average Pain 4 Pain Right Now 4 My pain is intermittent, sharp and aching  In the last 24 hours, has pain interfered with the following? General activity 4 Relation with others 1 Enjoyment of life 4 What TIME of day is your pain at its worst? evening Sleep (in general) Fair  Pain is worse with: sitting, standing and some activites Pain improves with: rest, pacing activities and medication Relief from Meds: 6  Mobility walk without assistance ability to climb steps?  yes do you drive?  yes  Function retired  Neuro/Psych No problems in this area  Prior Studies Any changes since last visit?  no  Physicians involved in your care Any changes since last visit?  no   Family History  Problem Relation Age of Onset  . Stroke Mother   . Heart attack Father   . Diabetes Brother   . Heart disease Brother    Social History   Social History  . Marital status: Married    Spouse name: N/A  . Number of children: N/A  . Years of education: N/A   Social History Main Topics  . Smoking status: Former Smoker    Packs/day: 1.00    Years: 15.00  . Smokeless tobacco: Never Used     Comment: quit  smoking in 1985  . Alcohol use 3.6 oz/week    6 Standard drinks or equivalent per week     Comment: occ wine w/ supper  . Drug use: No  . Sexual activity: Yes    Partners: Male    Birth control/ protection: Surgical     Comment: hysterectomy   Other Topics Concern  . Not on file   Social History Narrative  . No narrative on file   Past Surgical History:  Procedure Laterality Date  . ABDOMINAL HYSTERECTOMY  1982   BSO  . ANTERIOR CERVICAL DECOMP/DISCECTOMY FUSION N/A 10/03/2014   Procedure: ANTERIOR CERVICAL DECOMPRESSION/DISCECTOMY FUSION 1 LEVEL,CERVICAL THREE-FOUR;  Surgeon: [name], MD;  Location: [facility];  Service: Neurosurgery;  Laterality: N/A;  . APPENDECTOMY  1986   along with chole  . BREAST BIOPSY Right 1997   sclerosing adenosis -Dr. Lucia Gaskins  . CHOLECYSTECTOMY    . COLONOSCOPY    . ESOPHAGOGASTRODUODENOSCOPY     with dilitation  . LIPOMA EXCISION Left 10/1999   left thigh  . lumbosacral cyst  2005   seen on injection for low back pain   . LUNG BIOPSY  05/2001   /w Dr.Burney- bronchoscopy, mediastinoscopy  . REVERSE SHOULDER ARTHROPLASTY Left 02/01/2016   Procedure: LEFT REVERSE TOTAL SHOULDER ARTHROPLASTY;  Surgeon: [name], MD;  Location: [facility];  Service: Orthopedics;  Laterality: Left;  . rotator cuff  Bilateral 1996/1998   repair  .  ROTATOR CUFF REPAIR Left 1996; 1981  . ROTATOR CUFF REPAIR Right 1996  . TONSILLECTOMY    . TOTAL HIP ARTHROPLASTY Left 10/2000  . TOTAL HIP ARTHROPLASTY Right 06/2005  . TOTAL HIP ARTHROPLASTY Left    10/2000  . VAGINAL DELIVERY     x1   Past Medical History:  Diagnosis Date  . Asthmatic bronchitis   . Cataracts, bilateral    immature  . Chronic back pain    arthritis.Stenosis  . Dysphagia   . Dysrhythmia    (?or extra beats) treated /w atenolol  . Emphysema lung ([place])    left  . GERD (gastroesophageal reflux disease)    takes Omeprazole daily  . Hard of hearing    wears hearing aides   . History  of blood transfusion    no abnormal reaction noted  . History of gastric ulcer   . Hypercholesteremia    takes Atorvastatin daily  . Hypertension    takes Coreg and Losartan daily  . Hypothyroidism    takes Synthroid daily  . IBS (irritable bowel syndrome)    takes Librax daily  . Joint pain   . Lumbosacral spondylosis without myelopathy   . Melanoma ([place])    on left leg, wide excision   . Muscle spasm    takes Robaxin daily as needed  . Neuromuscular disorder ([place])    esophageal spasms at times   . Osteoarthritis   . Peripheral edema    take HCTZ daily  . Pneumonia    hx of.Many yrs ago  . 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 Seasonal allergies    takes Allegra daily and uses Flonase daily as needed  . Shortness of breath dyspnea    with exertion  . Venous stasis   . Yeast infection    given Diflucan today   LMP 11/03/1980 (Approximate)   Opioid Risk Score:   Fall Risk Score:  `1  Depression screen PHQ 2/9  Depression screen [facility] 2/9 02/19/2017 09/30/2016 12/13/2015 01/11/2015  Decreased Interest 0 0 0 0  Down, Depressed, Hopeless 0 0 0 0  PHQ - 2 Score 0 0 0 0  Altered sleeping - - - 0  Tired, decreased energy - - - 1  Change in appetite - - - 0  Feeling bad or failure about yourself  - - - 0  Trouble concentrating - - - 1  Moving slowly or fidgety/restless - - - 0  Suicidal thoughts - - - 0  PHQ-9 Score - - - 2     Review of Systems  Constitutional: Negative.   HENT: Negative.   Eyes: Negative.   Respiratory: Negative.   Cardiovascular: Negative.   Gastrointestinal: Negative.   Endocrine: Negative.   Genitourinary: Negative.   Musculoskeletal: Negative.   Skin: Negative.   Allergic/Immunologic: Negative.   Neurological: Negative.   Hematological: Negative.   Psychiatric/Behavioral: Negative.   All other systems reviewed and are negative.      Objective:   Physical Exam  Constitutional: She is  oriented to person, place, and time. She appears well-developed and well-nourished.  HENT:  Head: Normocephalic and atraumatic.  Eyes: Conjunctivae and EOM are normal. Pupils are equal, round, and reactive to light.  Neck: Normal range of motion.  Musculoskeletal:  Lumbar range of motion 90 of flexion, 30 of extension, 10 lateral bending bilaterally.  Negative straight leg raising. Mild tenderness to palpation in the right lumbar paraspinal  muscles. Skin over the lumbar area shows no evidence of erythema or drainage.   Neurological: She is alert and oriented to person, place, and time. Gait normal.  Motor strength is 5/5 bilateral hip flexor, knee extensor, ankle dorsi flexors, plantar flexor  Psychiatric: She has a normal mood and affect.  Nursing note and vitals reviewed. Pain over the peroneus brevis tendon posterior to the lateral malleolus. Pain with resisted eversion of foot. No swelling, no erythema. No pain with ankle range of motion        Assessment & Plan:  1. Lumbar spondylosis without myelopathy.  Partial relief with lumbar radiofrequency neurotomy. We'll monitor effect over time and decide whether repeat procedure will be needed after 6 months.  Continue Demerol 50 mg 3 times a day Nurse practitioner. Visit 1 month Continue opioid monitoring program. This consists of regular clinic visits, examinations, urine drug screen, pill counts as well as use of [place] controlled substance reporting System. UDS 03/17/2017 was consistent  2. Peroneal tendon injury, appears to be mild, Voltaren gel, recheck in 1 month

## 2017-04-28 NOTE — Patient Instructions (Signed)
Peroneal Tendinopathy Peroneal tendinopathy is irritation of the tendons that pass behind your ankle (peroneal tendons). These tendons attach muscles in your foot to a bone on the side of your foot and underneath the arch of your foot. This condition can cause your peroneal tendons to get bigger and swell. What are the causes? This condition may be caused by:  Putting stress on your ankle over and over again (overuse injury).  A sudden injury that puts stress on your tendons, such as an ankle sprain.  What increases the risk? This condition is more likely to develop in:  People who have high arches.  Athletes who play sports that involve putting stress on the ankle over and over again. These sports include: ? Running. ? Dancing. ? Soccer. ? Basketball.  What are the signs or symptoms? Symptoms of this condition can start suddenly or develop gradually. Symptoms include:  Pain in the back of the ankle, on the side of the foot, or in the arch of the foot.  Pain that gets worse with activity and better with rest.  Swelling.  Warmth.  Weakness in your foot or ankle.  How is this diagnosed? This condition may be diagnosed based on:  Your symptoms.  Your medical history.  A physical exam.  Imaging tests, such as: ? An X-ray or CT scan to check for bone injury. ? MRI or ultrasound to check for muscle or tendon injury.  During your physical exam, your health care provider may move your foot and ankle and test the strength of your leg muscles. How is this treated? This condition may be treated by:  Keeping your body weight off your ankle for several days.  Returning gradually to full activity gradually.  Putting ice on your ankle to reduce swelling.  Taking an anti-inflammatory pain medicine (NSAID).  Having medicine injected into your tendon to reduce swelling.  Wearing a removable boot or brace for ankle support.  Doing range-of-motion exercises and strengthening  exercises (physical therapy) when pain and swelling improve.  If the condition does not improve with treatment, or if a tendon or muscle is damaged, surgery may be needed. Follow these instructions at home: If you have a boot or brace:  Wear it as told by your health care provider. Remove it only as told by your health care provider.  Loosen it if your toes tingle, become numb, or turn cold and blue.  Do not let it get wet if it is not waterproof.  Keep it clean. Managing pain, stiffness, and swelling  If directed, apply ice to the injured area: ? Put ice in a plastic bag. ? Place a towel between your skin and the bag. ? Leave the ice on for 20 minutes, 2-3 times a day.  Take over-the-counter and prescription medicines only as told by your health care provider.  Raise (elevate) your ankle above the level of your heart when resting if you have swelling. Activity  Do not use your ankle to support (bear) your full body weight until your health care provider says that you can.  Do not do activities that make pain or swelling worse.  Return to your normal activities as told by your health care provider. General instructions  Keep all follow-up visits as told by your health care provider. This is important. How is this prevented?  Wear supportive footwear that is appropriate for your athletic activity.  Avoid athletic activities that cause swelling or pain in your ankle or foot.  See   your health care provider if you have pain or swelling that does not improve after a few days of rest.  Stop training if you develop pain or swelling.  If you start a new athletic activity, start gradually to build up your strength, endurance, and flexibility. Contact a health care provider if:  Your symptoms get worse.  Your symptoms do not improve in 2-4 weeks.  You develop new, unexplained symptoms. This information is not intended to replace advice given to you by your health care  provider. Make sure you discuss any questions you have with your health care provider. Document Released: 10/20/2005 Document Revised: 06/24/2016 Document Reviewed: 09/08/2015 Elsevier Interactive Patient Education  2018 Elsevier Inc.  

## 2017-05-26 ENCOUNTER — Encounter: Payer: Self-pay | Admitting: Registered Nurse

## 2017-05-26 ENCOUNTER — Encounter: Payer: Medicare Other | Attending: Physical Medicine and Rehabilitation | Admitting: Registered Nurse

## 2017-05-26 VITALS — BP 146/64 | HR 70 | Temp 98.1°F

## 2017-05-26 DIAGNOSIS — Z5181 Encounter for therapeutic drug level monitoring: Secondary | ICD-10-CM | POA: Diagnosis not present

## 2017-05-26 DIAGNOSIS — G894 Chronic pain syndrome: Secondary | ICD-10-CM

## 2017-05-26 DIAGNOSIS — Z79899 Other long term (current) drug therapy: Secondary | ICD-10-CM | POA: Diagnosis present

## 2017-05-26 DIAGNOSIS — M12811 Other specific arthropathies, not elsewhere classified, right shoulder: Secondary | ICD-10-CM | POA: Insufficient documentation

## 2017-05-26 DIAGNOSIS — M12812 Other specific arthropathies, not elsewhere classified, left shoulder: Secondary | ICD-10-CM | POA: Diagnosis present

## 2017-05-26 DIAGNOSIS — M25511 Pain in right shoulder: Secondary | ICD-10-CM | POA: Diagnosis not present

## 2017-05-26 DIAGNOSIS — M47812 Spondylosis without myelopathy or radiculopathy, cervical region: Secondary | ICD-10-CM | POA: Diagnosis present

## 2017-05-26 DIAGNOSIS — M47816 Spondylosis without myelopathy or radiculopathy, lumbar region: Secondary | ICD-10-CM | POA: Insufficient documentation

## 2017-05-26 DIAGNOSIS — G8929 Other chronic pain: Secondary | ICD-10-CM | POA: Diagnosis not present

## 2017-05-26 MED ORDER — MEPERIDINE HCL 50 MG PO TABS: 50.0000 mg | ORAL_TABLET | Freq: Three times a day (TID) | ORAL | 0 refills | Status: DC

## 2017-05-26 NOTE — Progress Notes (Signed)
Subjective:    Patient ID: Jenna [name], female    DOB: 1933/02/27, 83 y.o.   MRN: [id number]  HPI: [name] is a 83 year old female who returns for follow up appointmentfor chronic pain and medication refill. She states her pain is located in her left hand andlower back mainly right side. She rates her pain 2. Her current exercise regime is walking in her home and using the Treadmill in her home QOD for 15 minutes.  Last UDS was on 03/17/2017, it was consistent.  Pain Inventory Average Pain 4 Pain Right Now 2 My pain is intermittent, sharp and aching  In the last 24 hours, has pain interfered with the following? General activity 3 Relation with others 0 Enjoyment of life 2 What TIME of day is your pain at its worst? evening, daytime Sleep (in general) Good  Pain is worse with: standing and some activites Pain improves with: rest, pacing activities and medication Relief from Meds: 7  Mobility walk without assistance ability to climb steps?  yes do you drive?  yes Do you have any goals in this area?  yes  Function retired  Neuro/Psych No problems in this area  Prior Studies .  Physicians involved in your care .   Family History  Problem Relation Age of Onset  . Stroke Mother   . Heart attack Father   . Diabetes Brother   . Heart disease Brother    Social History   Social History  . Marital status: Married    Spouse name: N/A  . Number of children: N/A  . Years of education: N/A   Social History Main Topics  . Smoking status: Former Smoker    Packs/day: 1.00    Years: 15.00  . Smokeless tobacco: Never Used     Comment: quit smoking in 1985  . Alcohol use 3.6 oz/week    6 Standard drinks or equivalent per week     Comment: occ wine w/ supper  . Drug use: No  . Sexual activity: Yes    Partners: Male    Birth control/ protection: Surgical     Comment: hysterectomy   Other Topics Concern  . None   Social History Narrative  .  None   Past Surgical History:  Procedure Laterality Date  . ABDOMINAL HYSTERECTOMY  1982   BSO  . ANTERIOR CERVICAL DECOMP/DISCECTOMY FUSION N/A 10/03/2014   Procedure: ANTERIOR CERVICAL DECOMPRESSION/DISCECTOMY FUSION 1 LEVEL,CERVICAL THREE-FOUR;  Surgeon: [name], MD;  Location: [facility];  Service: Neurosurgery;  Laterality: N/A;  . APPENDECTOMY  1986   along with chole  . BREAST BIOPSY Right 1997   sclerosing adenosis -Dr. Lucia Gaskins  . CHOLECYSTECTOMY    . COLONOSCOPY    . ESOPHAGOGASTRODUODENOSCOPY     with dilitation  . LIPOMA EXCISION Left 10/1999   left thigh  . lumbosacral cyst  2005   seen on injection for low back pain   . LUNG BIOPSY  05/2001   /w Dr.Burney- bronchoscopy, mediastinoscopy  . REVERSE SHOULDER ARTHROPLASTY Left 02/01/2016   Procedure: LEFT REVERSE TOTAL SHOULDER ARTHROPLASTY;  Surgeon: [name], MD;  Location: [facility];  Service: Orthopedics;  Laterality: Left;  . rotator cuff  Bilateral 1996/1998   repair  . ROTATOR CUFF REPAIR Left 1996; 1981  . ROTATOR CUFF REPAIR Right 1996  . TONSILLECTOMY    . TOTAL HIP ARTHROPLASTY Left 10/2000  . TOTAL HIP ARTHROPLASTY Right 06/2005  . TOTAL HIP ARTHROPLASTY Left  10/2000  . VAGINAL DELIVERY     x1   Past Medical History:  Diagnosis Date  . Asthmatic bronchitis   . Cataracts, bilateral    immature  . Chronic back pain    arthritis.Stenosis  . Dysphagia   . Dysrhythmia    (?or extra beats) treated /w atenolol  . Emphysema lung ([place])    left  . GERD (gastroesophageal reflux disease)    takes Omeprazole daily  . Hard of hearing    wears hearing aides   . History of blood transfusion    no abnormal reaction noted  . History of gastric ulcer   . Hypercholesteremia    takes Atorvastatin daily  . Hypertension    takes Coreg and Losartan daily  . Hypothyroidism    takes Synthroid daily  . IBS (irritable bowel syndrome)    takes Librax daily  . Joint pain   . Lumbosacral spondylosis without  myelopathy   . Melanoma ([place])    on left leg, wide excision   . Muscle spasm    takes Robaxin daily as needed  . Neuromuscular disorder ([place])    esophageal spasms at times   . Osteoarthritis   . Peripheral edema    take HCTZ daily  . Pneumonia    hx of.Many yrs ago  . 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 Seasonal allergies    takes Allegra daily and uses Flonase daily as needed  . Shortness of breath dyspnea    with exertion  . Venous stasis   . Yeast infection    given Diflucan today   BP (!) 146/64   Pulse 70   Temp 98.1 F (36.7 C)   LMP 11/03/1980 (Approximate)   SpO2 95%   Opioid Risk Score:   Fall Risk Score:  `1  Depression screen PHQ 2/9  Depression screen [facility] 2/9 02/19/2017 09/30/2016 12/13/2015 01/11/2015  Decreased Interest 0 0 0 0  Down, Depressed, Hopeless 0 0 0 0  PHQ - 2 Score 0 0 0 0  Altered sleeping - - - 0  Tired, decreased energy - - - 1  Change in appetite - - - 0  Feeling bad or failure about yourself  - - - 0  Trouble concentrating - - - 1  Moving slowly or fidgety/restless - - - 0  Suicidal thoughts - - - 0  PHQ-9 Score - - - 2    Review of Systems  Constitutional: Negative.   HENT: Negative.   Eyes: Negative.   Respiratory: Negative.   Cardiovascular: Negative.   Gastrointestinal: Negative.   Endocrine: Negative.   Genitourinary: Negative.   Musculoskeletal: Negative.   Skin: Negative.   Allergic/Immunologic: Negative.   Neurological: Negative.   Hematological: Negative.   Psychiatric/Behavioral: Negative.   All other systems reviewed and are negative.      Objective:   Physical Exam  Constitutional: She is oriented to person, place, and time. She appears well-developed and well-nourished.  HENT:  Head: Normocephalic and atraumatic.  Neck: Normal range of motion. Neck supple.  Cardiovascular: Normal rate and regular rhythm.   Pulmonary/Chest: Effort normal and breath sounds  normal.  Musculoskeletal:  Normal Muscle Bulk and Muscle Testing Reveals: Upper Extremities: Right:  Full ROM and Muscle Strength Right 5/5 and Left 4/5 Thoracic Hypersensitivity Lumbar Paraspinal Tenderness: L-3-L-5 Lower Extremities: Full ROM and  Muscle Strength 5/5 Arises from chair with ease Narrow Based gait   Neurological:  She is alert and oriented to person, place, and time.  Skin: Skin is warm and dry.  Psychiatric: She has a normal mood and affect.  Nursing note and vitals reviewed.         Assessment & Plan:  1.Chronic low back pain with lumbar spondylosis. 05/26/2017 Refilled: Meperidine 50 mg one tablet three times a day #90.  We will continue the opioid monitoring program,this consists of regular clinic visits, examinations, urine drug screen, pill counts as well as use of [place] Controlled Substance reporting System. 2. Lumbar Radiculitis: S/P  Lumbar Radiofrequency on 03/17/2017 with relief noted. Continue to monitor. 05/26/2017 3. Cervical stenosis with radiculitis : S/P Anterior Cervical Fusion C3-C4 with Dr. Ellene Route on 10/03/2014. 05/26/2017 4. Chronic bilateral shoulder pain L>R: No complaints today.Continue with Current medication regime. 05/26/2017 S/P Left Reverse Total Shoulder Arthroplasty on 02/01/16 via Dr. Veverly Fells  20 minutes of face to face patient care time was spent during this visit. All questions were encouraged and answered.  F/U in 1 month

## 2017-06-02 ENCOUNTER — Telehealth: Payer: Self-pay | Admitting: Registered Nurse

## 2017-06-02 NOTE — Telephone Encounter (Signed)
On 06/02/2017 the  [place] was reviewed no conflict was seen on the [place] with multiple prescribers. Jenna [name] has a signed narcotic contract with our office. If there were any discrepancies this would have been reported to her physician.

## 2017-06-11 ENCOUNTER — Encounter: Payer: Self-pay | Admitting: Nurse Practitioner

## 2017-06-11 ENCOUNTER — Telehealth: Payer: Self-pay | Admitting: Certified Nurse Midwife

## 2017-06-11 NOTE — Telephone Encounter (Signed)
Patient returning your call.

## 2017-06-11 NOTE — Telephone Encounter (Signed)
Left message with husband, Jenna [name], at home number to return call. OK per DPR.

## 2017-06-11 NOTE — Telephone Encounter (Signed)
Patient notified of bone density results and recommendations.  She voices understanding and is agreeable with plan. Patient is appreciative of call and will discuss more if she has questions at her next annual exam.  Closing encounter.

## 2017-06-30 ENCOUNTER — Encounter: Payer: Self-pay | Admitting: Registered Nurse

## 2017-06-30 ENCOUNTER — Encounter: Payer: Medicare Other | Attending: Physical Medicine and Rehabilitation | Admitting: Registered Nurse

## 2017-06-30 VITALS — BP 148/73 | HR 70

## 2017-06-30 DIAGNOSIS — M47816 Spondylosis without myelopathy or radiculopathy, lumbar region: Secondary | ICD-10-CM

## 2017-06-30 DIAGNOSIS — Z79899 Other long term (current) drug therapy: Secondary | ICD-10-CM | POA: Diagnosis not present

## 2017-06-30 DIAGNOSIS — Z5181 Encounter for therapeutic drug level monitoring: Secondary | ICD-10-CM | POA: Diagnosis not present

## 2017-06-30 DIAGNOSIS — M12812 Other specific arthropathies, not elsewhere classified, left shoulder: Secondary | ICD-10-CM | POA: Insufficient documentation

## 2017-06-30 DIAGNOSIS — M12811 Other specific arthropathies, not elsewhere classified, right shoulder: Secondary | ICD-10-CM | POA: Diagnosis present

## 2017-06-30 DIAGNOSIS — M47812 Spondylosis without myelopathy or radiculopathy, cervical region: Secondary | ICD-10-CM | POA: Insufficient documentation

## 2017-06-30 DIAGNOSIS — G894 Chronic pain syndrome: Secondary | ICD-10-CM

## 2017-06-30 MED ORDER — MEPERIDINE HCL 50 MG PO TABS: 50.0000 mg | ORAL_TABLET | Freq: Three times a day (TID) | ORAL | 0 refills | Status: DC

## 2017-06-30 NOTE — Progress Notes (Signed)
Subjective:    Patient ID: Jenna [name], female    DOB: 26-Jun-1933, 83 y.o.   MRN: [id number]  HPI: [name] is a 83 year old female who returns for follow up appointmentfor chronic pain and medication refill. She states her pain is located in her lower back mainly right side. She rates her pain 4. Her current exercise regime is walking in her home and using the Treadmill in her home QOD for 15 minutes.  Last UDS was on 03/17/2017, it was consistent.   Pain Inventory Average Pain 4 Pain Right Now 4 My pain is constant, sharp, burning and aching  In the last 24 hours, has pain interfered with the following? General activity 3 Relation with others 3 Enjoyment of life 3 What TIME of day is your pain at its worst? daytime and evening Sleep (in general) Good  Pain is worse with: walking, standing and some activites Pain improves with: rest, heat/ice and medication Relief from Meds: 7  Mobility walk without assistance how many minutes can you walk? 15 ability to climb steps?  yes do you drive?  yes  Function retired  Neuro/Psych No problems in this area  Prior Studies Any changes since last visit?  no  Physicians involved in your care Any changes since last visit?  no   Family History  Problem Relation Age of Onset  . Stroke Mother   . Heart attack Father   . Diabetes Brother   . Heart disease Brother    Social History   Social History  . Marital status: Married    Spouse name: N/A  . Number of children: N/A  . Years of education: N/A   Social History Main Topics  . Smoking status: Former Smoker    Packs/day: 1.00    Years: 15.00  . Smokeless tobacco: Never Used     Comment: quit smoking in 1985  . Alcohol use 3.6 oz/week    6 Standard drinks or equivalent per week     Comment: occ wine w/ supper  . Drug use: No  . Sexual activity: Yes    Partners: Male    Birth control/ protection: Surgical     Comment: hysterectomy   Other  Topics Concern  . None   Social History Narrative  . None   Past Surgical History:  Procedure Laterality Date  . ABDOMINAL HYSTERECTOMY  1982   BSO  . ANTERIOR CERVICAL DECOMP/DISCECTOMY FUSION N/A 10/03/2014   Procedure: ANTERIOR CERVICAL DECOMPRESSION/DISCECTOMY FUSION 1 LEVEL,CERVICAL THREE-FOUR;  Surgeon: [name], MD;  Location: [facility];  Service: Neurosurgery;  Laterality: N/A;  . APPENDECTOMY  1986   along with chole  . BREAST BIOPSY Right 1997   sclerosing adenosis -Dr. Lucia Gaskins  . CHOLECYSTECTOMY    . COLONOSCOPY    . ESOPHAGOGASTRODUODENOSCOPY     with dilitation  . LIPOMA EXCISION Left 10/1999   left thigh  . lumbosacral cyst  2005   seen on injection for low back pain   . LUNG BIOPSY  05/2001   /w Dr.Burney- bronchoscopy, mediastinoscopy  . REVERSE SHOULDER ARTHROPLASTY Left 02/01/2016   Procedure: LEFT REVERSE TOTAL SHOULDER ARTHROPLASTY;  Surgeon: [name], MD;  Location: [facility];  Service: Orthopedics;  Laterality: Left;  . rotator cuff  Bilateral 1996/1998   repair  . ROTATOR CUFF REPAIR Left 1996; 1981  . ROTATOR CUFF REPAIR Right 1996  . TONSILLECTOMY    . TOTAL HIP ARTHROPLASTY Left 10/2000  . TOTAL HIP  ARTHROPLASTY Right 06/2005  . TOTAL HIP ARTHROPLASTY Left    10/2000  . VAGINAL DELIVERY     x1   Past Medical History:  Diagnosis Date  . Asthmatic bronchitis   . Cataracts, bilateral    immature  . Chronic back pain    arthritis.Stenosis  . Dysphagia   . Dysrhythmia    (?or extra beats) treated /w atenolol  . Emphysema lung ([place])    left  . GERD (gastroesophageal reflux disease)    takes Omeprazole daily  . Hard of hearing    wears hearing aides   . History of blood transfusion    no abnormal reaction noted  . History of gastric ulcer   . Hypercholesteremia    takes Atorvastatin daily  . Hypertension    takes Coreg and Losartan daily  . Hypothyroidism    takes Synthroid daily  . IBS (irritable bowel syndrome)    takes Librax  daily  . Joint pain   . Lumbosacral spondylosis without myelopathy   . Melanoma ([place])    on left leg, wide excision   . Muscle spasm    takes Robaxin daily as needed  . Neuromuscular disorder ([place])    esophageal spasms at times   . Osteoarthritis   . Peripheral edema    take HCTZ daily  . Pneumonia    hx of.Many yrs ago  . 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 Seasonal allergies    takes Allegra daily and uses Flonase daily as needed  . Shortness of breath dyspnea    with exertion  . Venous stasis   . Yeast infection    given Diflucan today   BP (!) 148/73   Pulse 70   LMP 11/03/1980 (Approximate)   SpO2 98%   Opioid Risk Score:  0 Fall Risk Score:  `1  Depression screen PHQ 2/9  Depression screen [facility] 2/9 06/30/2017 02/19/2017 09/30/2016 12/13/2015 01/11/2015  Decreased Interest 0 0 0 0 0  Down, Depressed, Hopeless 0 0 0 0 0  PHQ - 2 Score 0 0 0 0 0  Altered sleeping - - - - 0  Tired, decreased energy - - - - 1  Change in appetite - - - - 0  Feeling bad or failure about yourself  - - - - 0  Trouble concentrating - - - - 1  Moving slowly or fidgety/restless - - - - 0  Suicidal thoughts - - - - 0  PHQ-9 Score - - - - 2    Review of Systems  Constitutional: Negative.   HENT: Negative.   Eyes: Negative.   Respiratory: Negative.   Cardiovascular: Negative.   Gastrointestinal: Negative.   Endocrine: Negative.   Genitourinary: Negative.   Musculoskeletal: Negative.   Skin: Negative.   Allergic/Immunologic: Negative.   Neurological: Negative.   Hematological: Negative.   Psychiatric/Behavioral: Negative.   All other systems reviewed and are negative.      Objective:   Physical Exam  Constitutional: She is oriented to person, place, and time. She appears well-developed and well-nourished.  HENT:  Head: Normocephalic and atraumatic.  Neck: Normal range of motion. Neck supple.  Cardiovascular: Normal rate and  regular rhythm.   Pulmonary/Chest: Effort normal and breath sounds normal.  Musculoskeletal:  Normal Muscle Bulk and Muscle Testing Reveals: Upper Extremities: Full ROM and Muscle Strength 5/5 Spinal Forward Flexion 90 Degrees and Extension 20 Degrees  Lumbar Paraspinal Tenderness: L-3-L-5 Mainly  Right Side Lower Extremities: Full ROM and Muscle Strength 5/5 Arises from Table with ease Narrow Based Gait  Neurological: She is alert and oriented to person, place, and time.  Skin: Skin is warm and dry.  Psychiatric: She has a normal mood and affect.  Nursing note and vitals reviewed.         Assessment & Plan:  1.Chronic low back pain with lumbar spondylosis. 06/30/2017 Refilled: Meperidine 50 mg one tablet three times a day #90.  We will continue the opioid monitoring program,this consists of regular clinic visits, examinations, urine drug screen, pill counts as well as use of [place] Controlled Substance reporting System. 2. Lumbar Radiculitis: S/P  Lumbar Radiofrequency on 03/17/2017 with relief noted. Continue to monitor. 06/30/2017 3. Cervical stenosis with radiculitis : S/P Anterior Cervical Fusion C3-C4 with Dr. Ellene Route on 10/03/2014. 06/30/2017 4. Chronic bilateral shoulder pain L>R: No complaints today.Continue with Current medication regime. 06/30/2017 S/P Left Reverse Total Shoulder Arthroplasty on 02/01/16 via Dr. Veverly Fells  20 minutes of face to face patient care time was spent during this visit. All questions were encouraged and answered.  F/U in 1 month

## 2017-07-21 ENCOUNTER — Encounter: Payer: Medicare Other | Attending: Physical Medicine and Rehabilitation | Admitting: Registered Nurse

## 2017-07-21 ENCOUNTER — Encounter: Payer: Self-pay | Admitting: Registered Nurse

## 2017-07-21 VITALS — BP 168/86 | HR 75 | Resp 16

## 2017-07-21 DIAGNOSIS — M47812 Spondylosis without myelopathy or radiculopathy, cervical region: Secondary | ICD-10-CM | POA: Diagnosis present

## 2017-07-21 DIAGNOSIS — Z79899 Other long term (current) drug therapy: Secondary | ICD-10-CM | POA: Diagnosis present

## 2017-07-21 DIAGNOSIS — M47816 Spondylosis without myelopathy or radiculopathy, lumbar region: Secondary | ICD-10-CM

## 2017-07-21 DIAGNOSIS — M12812 Other specific arthropathies, not elsewhere classified, left shoulder: Secondary | ICD-10-CM | POA: Diagnosis present

## 2017-07-21 DIAGNOSIS — Z5181 Encounter for therapeutic drug level monitoring: Secondary | ICD-10-CM | POA: Insufficient documentation

## 2017-07-21 DIAGNOSIS — M12811 Other specific arthropathies, not elsewhere classified, right shoulder: Secondary | ICD-10-CM | POA: Diagnosis present

## 2017-07-21 DIAGNOSIS — G894 Chronic pain syndrome: Secondary | ICD-10-CM | POA: Diagnosis not present

## 2017-07-21 MED ORDER — MEPERIDINE HCL 50 MG PO TABS: 50.0000 mg | ORAL_TABLET | Freq: Three times a day (TID) | ORAL | 0 refills | Status: DC

## 2017-07-21 NOTE — Progress Notes (Signed)
Subjective:    Patient ID: Jenna [name], female    DOB: 06/17/1933, 83 y.o.   MRN: [id number]  HPI: Jenna [name] is a 83 year old female who returns for follow up appointmentfor chronic pain and medication refill. She states her pain is located in her lower back mainly right side. She rates her pain 4. Her current exercise regime is walking in her home and using the Treadmill in her home QOD for 15 minutes.  Last UDS was on 03/17/2017, it was consistent.   Pain Inventory Average Pain 4 Pain Right Now 4 My pain is constant, sharp, burning and aching  In the last 24 hours, has pain interfered with the following? General activity 3 Relation with others 2 Enjoyment of life 3 What TIME of day is your pain at its worst? daytime and evening Sleep (in general) Good  Pain is worse with: walking, standing and some activites Pain improves with: rest, heat/ice and medication Relief from Meds: 7  Mobility walk without assistance how many minutes can you walk? 15 ability to climb steps?  yes do you drive?  yes  Function retired  Neuro/Psych No problems in this area  Prior Studies Any changes since last visit?  no  Physicians involved in your care Any changes since last visit?  no   Family History  Problem Relation Age of Onset  . Stroke Mother   . Heart attack Father   . Diabetes Brother   . Heart disease Brother    Social History   Social History  . Marital status: Married    Spouse name: N/A  . Number of children: N/A  . Years of education: N/A   Social History Main Topics  . Smoking status: Former Smoker    Packs/day: 1.00    Years: 15.00  . Smokeless tobacco: Never Used     Comment: quit smoking in 1985  . Alcohol use 3.6 oz/week    6 Standard drinks or equivalent per week     Comment: occ wine w/ supper  . Drug use: No  . Sexual activity: Yes    Partners: Male    Birth control/ protection: Surgical     Comment: hysterectomy   Other  Topics Concern  . None   Social History Narrative  . None   Past Surgical History:  Procedure Laterality Date  . ABDOMINAL HYSTERECTOMY  1982   BSO  . ANTERIOR CERVICAL DECOMP/DISCECTOMY FUSION N/A 10/03/2014   Procedure: ANTERIOR CERVICAL DECOMPRESSION/DISCECTOMY FUSION 1 LEVEL,CERVICAL THREE-FOUR;  Surgeon: [name], MD;  Location: [place];  Service: Neurosurgery;  Laterality: N/A;  . APPENDECTOMY  1986   along with chole  . BREAST BIOPSY Right 1997   sclerosing adenosis -Dr. Lucia Gaskins  . CHOLECYSTECTOMY    . COLONOSCOPY    . ESOPHAGOGASTRODUODENOSCOPY     with dilitation  . LIPOMA EXCISION Left 10/1999   left thigh  . lumbosacral cyst  2005   seen on injection for low back pain   . LUNG BIOPSY  05/2001   /w Dr.Burney- bronchoscopy, mediastinoscopy  . REVERSE SHOULDER ARTHROPLASTY Left 02/01/2016   Procedure: LEFT REVERSE TOTAL SHOULDER ARTHROPLASTY;  Surgeon: [name], MD;  Location: [facility];  Service: Orthopedics;  Laterality: Left;  . rotator cuff  Bilateral 1996/1998   repair  . ROTATOR CUFF REPAIR Left 1996; 1981  . ROTATOR CUFF REPAIR Right 1996  . TONSILLECTOMY    . TOTAL HIP ARTHROPLASTY Left 10/2000  . TOTAL HIP  ARTHROPLASTY Right 06/2005  . TOTAL HIP ARTHROPLASTY Left    10/2000  . VAGINAL DELIVERY     x1   Past Medical History:  Diagnosis Date  . Asthmatic bronchitis   . Cataracts, bilateral    immature  . Chronic back pain    arthritis.Stenosis  . Dysphagia   . Dysrhythmia    (?or extra beats) treated /w atenolol  . Emphysema lung ([place])    left  . GERD (gastroesophageal reflux disease)    takes Omeprazole daily  . Hard of hearing    wears hearing aides   . History of blood transfusion    no abnormal reaction noted  . History of gastric ulcer   . Hypercholesteremia    takes Atorvastatin daily  . Hypertension    takes Coreg and Losartan daily  . Hypothyroidism    takes Synthroid daily  . IBS (irritable bowel syndrome)    takes Librax  daily  . Joint pain   . Lumbosacral spondylosis without myelopathy   . Melanoma ([place])    on left leg, wide excision   . Muscle spasm    takes Robaxin daily as needed  . Neuromuscular disorder ([place])    esophageal spasms at times   . Osteoarthritis   . Peripheral edema    take HCTZ daily  . Pneumonia    hx of.Many yrs ago  . 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 Seasonal allergies    takes Allegra daily and uses Flonase daily as needed  . Shortness of breath dyspnea    with exertion  . Venous stasis   . Yeast infection    given Diflucan today   BP (!) 168/86   Pulse 75   Resp 16   LMP 11/03/1980 (Approximate)   Opioid Risk Score:  0 Fall Risk Score:  `1  Depression screen PHQ 2/9  Depression screen [facility] 2/9 07/21/2017 06/30/2017 02/19/2017 09/30/2016 12/13/2015 01/11/2015  Decreased Interest 0 0 0 0 0 0  Down, Depressed, Hopeless 0 0 0 0 0 0  PHQ - 2 Score 0 0 0 0 0 0  Altered sleeping - - - - - 0  Tired, decreased energy - - - - - 1  Change in appetite - - - - - 0  Feeling bad or failure about yourself  - - - - - 0  Trouble concentrating - - - - - 1  Moving slowly or fidgety/restless - - - - - 0  Suicidal thoughts - - - - - 0  PHQ-9 Score - - - - - 2    Review of Systems  Constitutional: Negative.   HENT: Negative.   Eyes: Negative.   Respiratory: Negative.   Cardiovascular: Negative.   Gastrointestinal: Negative.   Endocrine: Negative.   Genitourinary: Negative.   Musculoskeletal: Negative.   Skin: Negative.   Allergic/Immunologic: Negative.   Neurological: Negative.   Hematological: Negative.   Psychiatric/Behavioral: Negative.   All other systems reviewed and are negative.      Objective:   Physical Exam  Constitutional: She is oriented to person, place, and time. She appears well-developed and well-nourished.  HENT:  Head: Normocephalic and atraumatic.  Neck: Normal range of motion. Neck supple.    Cardiovascular: Normal rate and regular rhythm.   Pulmonary/Chest: Effort normal and breath sounds normal.  Musculoskeletal:  Normal Muscle Bulk and Muscle Testing Reveals: Upper Extremities: Full ROM and Muscle Strength 5/5 Lumbar Paraspinal  Tenderness: L-3-L-5 Mainly Right Side Lower Extremities: Full ROM and Muscle Strength 5/5 Arises from Table with ease Narrow Based Gait  Neurological: She is alert and oriented to person, place, and time.  Skin: Skin is warm and dry.  Psychiatric: She has a normal mood and affect.  Nursing note and vitals reviewed.         Assessment & Plan:  1.Chronic low back pain with lumbar spondylosis. 07/21/2017 Refilled: Meperidine 50 mg one tablet three times a day #90.  We will continue the opioid monitoring program,this consists of regular clinic visits, examinations, urine drug screen, pill counts as well as use of [place] Controlled Substance reporting System. 2. Lumbar Radiculitis: S/P  Lumbar Radiofrequency on 03/17/2017 with relief noted. Continue to monitor. 07/21/2017 3. Cervical stenosis with radiculitis : No complaints Today. S/P Anterior Cervical Fusion C3-C4 with Dr. Ellene Route on 10/03/2014. 07/21/2017 4. Chronic bilateral shoulde pain L>R: No complaints today.Continue with Current medication regime. 07/21/2017 S/P Left Reverse Total Shoulder Arthroplasty on 02/01/16 via Dr. Veverly Fells  15  minutes of face to face patient care time was spent during this visit. All questions were encouraged and answered.  F/U in 1 month

## 2017-07-29 ENCOUNTER — Encounter: Payer: Medicare Other | Admitting: Registered Nurse

## 2017-08-11 ENCOUNTER — Ambulatory Visit: Payer: Medicare Other | Admitting: Nurse Practitioner

## 2017-08-18 ENCOUNTER — Ambulatory Visit: Payer: Medicare Other | Admitting: Certified Nurse Midwife

## 2017-08-19 ENCOUNTER — Ambulatory Visit: Payer: Medicare Other | Admitting: Certified Nurse Midwife

## 2017-08-25 ENCOUNTER — Encounter: Payer: Medicare Other | Attending: Physical Medicine and Rehabilitation | Admitting: Registered Nurse

## 2017-08-25 ENCOUNTER — Encounter: Payer: Self-pay | Admitting: Registered Nurse

## 2017-08-25 VITALS — BP 154/74 | HR 70

## 2017-08-25 DIAGNOSIS — Z5181 Encounter for therapeutic drug level monitoring: Secondary | ICD-10-CM | POA: Diagnosis not present

## 2017-08-25 DIAGNOSIS — M47812 Spondylosis without myelopathy or radiculopathy, cervical region: Secondary | ICD-10-CM | POA: Insufficient documentation

## 2017-08-25 DIAGNOSIS — M12811 Other specific arthropathies, not elsewhere classified, right shoulder: Secondary | ICD-10-CM | POA: Insufficient documentation

## 2017-08-25 DIAGNOSIS — Z79899 Other long term (current) drug therapy: Secondary | ICD-10-CM

## 2017-08-25 DIAGNOSIS — Z981 Arthrodesis status: Secondary | ICD-10-CM | POA: Diagnosis not present

## 2017-08-25 DIAGNOSIS — M47817 Spondylosis without myelopathy or radiculopathy, lumbosacral region: Secondary | ICD-10-CM | POA: Diagnosis not present

## 2017-08-25 DIAGNOSIS — G894 Chronic pain syndrome: Secondary | ICD-10-CM

## 2017-08-25 DIAGNOSIS — M47816 Spondylosis without myelopathy or radiculopathy, lumbar region: Secondary | ICD-10-CM | POA: Insufficient documentation

## 2017-08-25 DIAGNOSIS — M12812 Other specific arthropathies, not elsewhere classified, left shoulder: Secondary | ICD-10-CM | POA: Diagnosis present

## 2017-08-25 MED ORDER — MEPERIDINE HCL 50 MG PO TABS: 50.0000 mg | ORAL_TABLET | Freq: Three times a day (TID) | ORAL | 0 refills | Status: DC

## 2017-08-25 NOTE — Progress Notes (Signed)
Subjective:    Patient ID: Jenna [name], female    DOB: 03/28/1933, 83 y.o.   MRN: [id number]  HPI: [name] is a 83 year old female who returns for follow up appointmentfor chronic pain and medication refill. She states her pain is located in her lower back. She rates her pain 2. Her current exercise regime is walking in her home and using the Treadmill in her home QOD for 15 minutes.  [name] states she is using her Meperidine on an average 2-3 times a day as needed, encouraged to keep a journal. If she is able to tolerate we will decrease to BID as needed, she verbalizes understanding.   [name] Morphine equivalent is  15.00MME.  She  is also prescribed Diazepam by Dr.Roberts, she uses it sporadically last prescription picked up on 03/25/2017.  .We have discussed the black box warning of using opioids and benzodiazepines.I highlighted the dangers of using these drugs together and discussed the adverse events including respiratory suppression, overdose, cognitive impairment and importance of  compliance with current regimen. She verbalizes understanding, we will continue to monitor and adjust as indicated.    Last UDS was on 03/17/2017, it was consistent.   Pain Inventory Average Pain 4 Pain Right Now 2 My pain is intermittent, sharp and aching  In the last 24 hours, has pain interfered with the following? General activity 4 Relation with others 3 Enjoyment of life 3 What TIME of day is your pain at its worst? daytime and evening Sleep (in general) Good  Pain is worse with: walking, standing and some activites Pain improves with: rest, heat/ice and medication Relief from Meds: 7  Mobility walk without assistance how many minutes can you walk? 15 ability to climb steps?  yes do you drive?  yes  Function retired  Neuro/Psych No problems in this area  Prior Studies Any changes since last visit?  no  Physicians involved in your care Any changes since  last visit?  no   Family History  Problem Relation Age of Onset  . Stroke Mother   . Heart attack Father   . Diabetes Brother   . Heart disease Brother    Social History   Social History  . Marital status: Married    Spouse name: N/A  . Number of children: N/A  . Years of education: N/A   Social History Main Topics  . Smoking status: Former Smoker    Packs/day: 1.00    Years: 15.00  . Smokeless tobacco: Never Used     Comment: quit smoking in 1985  . Alcohol use 3.6 oz/week    6 Standard drinks or equivalent per week     Comment: occ wine w/ supper  . Drug use: No  . Sexual activity: Yes    Partners: Male    Birth control/ protection: Surgical     Comment: hysterectomy   Other Topics Concern  . None   Social History Narrative  . None   Past Surgical History:  Procedure Laterality Date  . ABDOMINAL HYSTERECTOMY  1982   BSO  . ANTERIOR CERVICAL DECOMP/DISCECTOMY FUSION N/A 10/03/2014   Procedure: ANTERIOR CERVICAL DECOMPRESSION/DISCECTOMY FUSION 1 LEVEL,CERVICAL THREE-FOUR;  Surgeon: [name], MD;  Location: [place] [facility];  Service: Neurosurgery;  Laterality: N/A;  . APPENDECTOMY  1986   along with chole  . BREAST BIOPSY Right 1997   sclerosing adenosis -Dr. Lucia Gaskins  . CHOLECYSTECTOMY    . COLONOSCOPY    .  ESOPHAGOGASTRODUODENOSCOPY     with dilitation  . LIPOMA EXCISION Left 10/1999   left thigh  . lumbosacral cyst  2005   seen on injection for low back pain   . LUNG BIOPSY  05/2001   /w Dr.Burney- bronchoscopy, mediastinoscopy  . REVERSE SHOULDER ARTHROPLASTY Left 02/01/2016   Procedure: LEFT REVERSE TOTAL SHOULDER ARTHROPLASTY;  Surgeon: [name], MD;  Location: [facility];  Service: Orthopedics;  Laterality: Left;  . rotator cuff  Bilateral 1996/1998   repair  . ROTATOR CUFF REPAIR Left 1996; 1981  . ROTATOR CUFF REPAIR Right 1996  . TONSILLECTOMY    . TOTAL HIP ARTHROPLASTY Left 10/2000  . TOTAL HIP ARTHROPLASTY Right 06/2005  . TOTAL HIP  ARTHROPLASTY Left    10/2000  . VAGINAL DELIVERY     x1   Past Medical History:  Diagnosis Date  . Asthmatic bronchitis   . Cataracts, bilateral    immature  . Chronic back pain    arthritis.Stenosis  . Dysphagia   . Dysrhythmia    (?or extra beats) treated /w atenolol  . Emphysema lung ([place])    left  . GERD (gastroesophageal reflux disease)    takes Omeprazole daily  . Hard of hearing    wears hearing aides   . History of blood transfusion    no abnormal reaction noted  . History of gastric ulcer   . Hypercholesteremia    takes Atorvastatin daily  . Hypertension    takes Coreg and Losartan daily  . Hypothyroidism    takes Synthroid daily  . IBS (irritable bowel syndrome)    takes Librax daily  . Joint pain   . Lumbosacral spondylosis without myelopathy   . Melanoma ([place])    on left leg, wide excision   . Muscle spasm    takes Robaxin daily as needed  . Neuromuscular disorder ([place])    esophageal spasms at times   . Osteoarthritis   . Peripheral edema    take HCTZ daily  . Pneumonia    hx of.Many yrs ago  . 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 Seasonal allergies    takes Allegra daily and uses Flonase daily as needed  . Shortness of breath dyspnea    with exertion  . Venous stasis   . Yeast infection    given Diflucan today   BP (!) 154/74   Pulse 70   LMP 11/03/1980 (Approximate)   SpO2 94%   Opioid Risk Score:  0 Fall Risk Score:  `1  Depression screen PHQ 2/9  Depression screen [facility] 2/9 07/21/2017 06/30/2017 02/19/2017 09/30/2016 12/13/2015 01/11/2015  Decreased Interest 0 0 0 0 0 0  Down, Depressed, Hopeless 0 0 0 0 0 0  PHQ - 2 Score 0 0 0 0 0 0  Altered sleeping - - - - - 0  Tired, decreased energy - - - - - 1  Change in appetite - - - - - 0  Feeling bad or failure about yourself  - - - - - 0  Trouble concentrating - - - - - 1  Moving slowly or fidgety/restless - - - - - 0  Suicidal thoughts - -  - - - 0  PHQ-9 Score - - - - - 2    Review of Systems  Constitutional: Negative.   HENT: Negative.   Eyes: Negative.   Respiratory: Negative.   Cardiovascular: Negative.   Gastrointestinal: Negative.  Endocrine: Negative.   Genitourinary: Negative.   Musculoskeletal: Negative.   Skin: Negative.   Allergic/Immunologic: Negative.   Neurological: Negative.   Hematological: Negative.   Psychiatric/Behavioral: Negative.   All other systems reviewed and are negative.      Objective:   Physical Exam  Constitutional: She is oriented to person, place, and time. She appears well-developed and well-nourished.  HENT:  Head: Normocephalic and atraumatic.  Neck: Normal range of motion. Neck supple.  Cardiovascular: Normal rate and regular rhythm.   Pulmonary/Chest: Effort normal and breath sounds normal.  Musculoskeletal:  Normal Muscle Bulk and Muscle Testing Reveals: Upper Extremities: Full ROM and Muscle Strength 5/5 Back without spinal tenderness noted  Lower Extremities: Full ROM and Muscle Strength 5/5 Arises from Table with ease Narrow Based Gait  Neurological: She is alert and oriented to person, place, and time.  Skin: Skin is warm and dry.  Psychiatric: She has a normal mood and affect.  Nursing note and vitals reviewed.         Assessment & Plan:  1.Chronic low back pain with lumbar spondylosis. 08/25/2017 Refilled: Meperidine 50 mg one tablet three times a day #90.  We will continue the opioid monitoring program,this consists of regular clinic visits, examinations, urine drug screen, pill counts as well as use of [place] Controlled Substance reporting System. 2. Lumbar Radiculitis: S/P  Lumbar Radiofrequency on 03/17/2017 with relief noted. Continue to monitor. 08/25/2017 3. Cervical stenosis with radiculitis : No complaints Today. S/P Anterior Cervical Fusion C3-C4 with Dr. Ellene Route on 10/03/2014. 08/25/2017 4. Chronic bilateral shoulde pain L>R: No  complaints today.Continue with Current medication regime. 08/25/2017 S/P Left Reverse Total Shoulder Arthroplasty on 02/01/16 via Dr. Veverly Fells  15  minutes of face to face patient care time was spent during this visit. All questions were encouraged and answered.  F/U in 1 month

## 2017-08-27 ENCOUNTER — Telehealth: Payer: Self-pay | Admitting: Registered Nurse

## 2017-08-27 NOTE — Telephone Encounter (Signed)
On 08/25/2017 the  [place] was reviewed no conflict was seen on the [place] with multiple prescribers. Ms. Bhagat has a signed narcotic contract with our office. If there were any discrepancies this would have been reported to her physician.

## 2017-08-30 LAB — DRUG TOX MONITOR 1 W/CONF, ORAL FLD
Amphetamines: NEGATIVE ng/mL (ref <10)
BENZODIAZEPINES: NEGATIVE ng/mL (ref <0.50)
Barbiturates: NEGATIVE ng/mL (ref <10)
Buprenorphine: NEGATIVE ng/mL (ref <0.10)
Cocaine: NEGATIVE ng/mL (ref <5.0)
Fentanyl: NEGATIVE ng/mL (ref <0.10)
HEROIN METABOLITE: NEGATIVE ng/mL (ref <1.0)
MARIJUANA: NEGATIVE ng/mL (ref <2.5)
MDMA: NEGATIVE ng/mL (ref <10)
Meprobamate: NEGATIVE ng/mL (ref <2.5)
Methadone: NEGATIVE ng/mL (ref <5.0)
NICOTINE METABOLITE: NEGATIVE ng/mL (ref <5.0)
Opiates: NEGATIVE ng/mL (ref <2.5)
PHENCYCLIDINE: NEGATIVE ng/mL (ref <10)
TAPENTADOL: NEGATIVE ng/mL (ref <5.0)
TRAMADOL: NEGATIVE ng/mL (ref <5.0)
Zolpidem: NEGATIVE ng/mL (ref <5.0)

## 2017-08-30 LAB — DRUG TOX ALC METAB W/CON, ORAL FLD: ALCOHOL METABOLITE: NEGATIVE ng/mL (ref <25)

## 2017-09-03 ENCOUNTER — Telehealth: Payer: Self-pay | Admitting: *Deleted

## 2017-09-03 NOTE — Telephone Encounter (Signed)
Oral swab drug screen was did not show  prescribed medication meperidine. Her last dose was reported the night before the test.  She had # 40 tablets on day 28 of Rx so she takes only when needed so this would be consistent.  All previous tests have been consistent.

## 2017-09-22 ENCOUNTER — Encounter: Payer: Medicare Other | Admitting: Registered Nurse

## 2017-09-23 ENCOUNTER — Ambulatory Visit: Payer: Medicare Other | Admitting: Certified Nurse Midwife

## 2017-09-30 ENCOUNTER — Encounter: Payer: Medicare Other | Attending: Physical Medicine and Rehabilitation | Admitting: Registered Nurse

## 2017-09-30 ENCOUNTER — Encounter: Payer: Self-pay | Admitting: Registered Nurse

## 2017-09-30 ENCOUNTER — Other Ambulatory Visit: Payer: Self-pay

## 2017-09-30 VITALS — BP 138/69 | HR 73 | Resp 14

## 2017-09-30 DIAGNOSIS — Z5181 Encounter for therapeutic drug level monitoring: Secondary | ICD-10-CM | POA: Diagnosis not present

## 2017-09-30 DIAGNOSIS — G894 Chronic pain syndrome: Secondary | ICD-10-CM | POA: Diagnosis not present

## 2017-09-30 DIAGNOSIS — M12811 Other specific arthropathies, not elsewhere classified, right shoulder: Secondary | ICD-10-CM | POA: Diagnosis present

## 2017-09-30 DIAGNOSIS — M47812 Spondylosis without myelopathy or radiculopathy, cervical region: Secondary | ICD-10-CM | POA: Insufficient documentation

## 2017-09-30 DIAGNOSIS — Z981 Arthrodesis status: Secondary | ICD-10-CM

## 2017-09-30 DIAGNOSIS — Z79899 Other long term (current) drug therapy: Secondary | ICD-10-CM | POA: Insufficient documentation

## 2017-09-30 DIAGNOSIS — M47817 Spondylosis without myelopathy or radiculopathy, lumbosacral region: Secondary | ICD-10-CM | POA: Diagnosis not present

## 2017-09-30 DIAGNOSIS — M47816 Spondylosis without myelopathy or radiculopathy, lumbar region: Secondary | ICD-10-CM | POA: Diagnosis not present

## 2017-09-30 DIAGNOSIS — M12812 Other specific arthropathies, not elsewhere classified, left shoulder: Secondary | ICD-10-CM | POA: Insufficient documentation

## 2017-09-30 MED ORDER — MEPERIDINE HCL 50 MG PO TABS: 50.0000 mg | ORAL_TABLET | Freq: Three times a day (TID) | ORAL | 0 refills | Status: DC

## 2017-09-30 NOTE — Progress Notes (Signed)
Subjective:    Patient ID: Jenna [name], female    DOB: 21-Aug-1933, 83 y.o.   MRN: [id number]  HPI: Ms. SERI KIMMER is a 83 year old female who returns for follow up appointmentfor chronic pain and medication refill. She states her pain is located in her lower back pain. She rates her pain 2. Her current exercise regime is walking in her home and using the Treadmill in her home QOD for 15 minutes.  Ms. Dam Morphine equivalent is  15.00 MME.  She  is also prescribed Diazepam by Dr.Roberts, she uses it sporadically last prescription was picked up on 03/25/2017. We have reviewed the black box warning again regarding using opioids and benzodiazepines.I highlighted the dangers of using these drugs together and discussed the adverse events including respiratory suppression, overdose, cognitive impairment and importance of  compliance with current regimen. She verbalizes understanding, we will continue to monitor and adjust as indicated.    Last UDS was on 08/25/2017, it was inconsistent, all previous UDS have been consistent.    Pain Inventory Average Pain 4 Pain Right Now 2 My pain is intermittent, sharp and aching  In the last 24 hours, has pain interfered with the following? General activity 4 Relation with others 3 Enjoyment of life 3 What TIME of day is your pain at its worst? daytime and evening Sleep (in general) Good  Pain is worse with: walking, standing and some activites Pain improves with: rest, heat/ice, pacing activities and medication Relief from Meds: 6  Mobility walk without assistance how many minutes can you walk? 15 ability to climb steps?  yes do you drive?  yes Do you have any goals in this area?  yes  Function retired Do you have any goals in this area?  no  Neuro/Psych No problems in this area  Prior Studies Any changes since last visit?  no  Physicians involved in your care Any changes since last visit?  no   Family History  Problem  Relation Age of Onset  . Stroke Mother   . Heart attack Father   . Diabetes Brother   . Heart disease Brother    Social History   Socioeconomic History  . Marital status: Married    Spouse name: None  . Number of children: None  . Years of education: None  . Highest education level: None  Social Needs  . Financial resource strain: None  . Food insecurity - worry: None  . Food insecurity - inability: None  . Transportation needs - medical: None  . Transportation needs - non-medical: None  Occupational History  . None  Tobacco Use  . Smoking status: Former Smoker    Packs/day: 1.00    Years: 15.00    Pack years: 15.00  . Smokeless tobacco: Never Used  . Tobacco comment: quit smoking in 1985  Substance and Sexual Activity  . Alcohol use: Yes    Alcohol/week: 3.6 oz    Types: 6 Standard drinks or equivalent per week    Comment: occ wine w/ supper  . Drug use: No  . Sexual activity: Yes    Partners: Male    Birth control/protection: Surgical    Comment: hysterectomy  Other Topics Concern  . None  Social History Narrative  . None   Past Surgical History:  Procedure Laterality Date  . ABDOMINAL HYSTERECTOMY  1982   BSO  . ANTERIOR CERVICAL DECOMP/DISCECTOMY FUSION N/A 10/03/2014   Procedure: ANTERIOR CERVICAL DECOMPRESSION/DISCECTOMY FUSION 1 LEVEL,CERVICAL THREE-FOUR;  Surgeon: [name], MD;  Location: [facility] NEURO ORS;  Service: Neurosurgery;  Laterality: N/A;  . APPENDECTOMY  1986   along with chole  . BREAST BIOPSY Right 1997   sclerosing adenosis -Dr. Lucia Gaskins  . CHOLECYSTECTOMY    . COLONOSCOPY    . ESOPHAGOGASTRODUODENOSCOPY     with dilitation  . LIPOMA EXCISION Left 10/1999   left thigh  . lumbosacral cyst  2005   seen on injection for low back pain   . LUNG BIOPSY  05/2001   /w Dr.Burney- bronchoscopy, mediastinoscopy  . REVERSE SHOULDER ARTHROPLASTY Left 02/01/2016   Procedure: LEFT REVERSE TOTAL SHOULDER ARTHROPLASTY;  Surgeon: [name], MD;   Location: [place];  Service: Orthopedics;  Laterality: Left;  . rotator cuff  Bilateral 1996/1998   repair  . ROTATOR CUFF REPAIR Left 1996; 1981  . ROTATOR CUFF REPAIR Right 1996  . TONSILLECTOMY    . TOTAL HIP ARTHROPLASTY Left 10/2000  . TOTAL HIP ARTHROPLASTY Right 06/2005  . TOTAL HIP ARTHROPLASTY Left    10/2000  . VAGINAL DELIVERY     x1   Past Medical History:  Diagnosis Date  . Asthmatic bronchitis   . Cataracts, bilateral    immature  . Chronic back pain    arthritis.Stenosis  . Dysphagia   . Dysrhythmia    (?or extra beats) treated /w atenolol  . Emphysema lung ([place])    left  . GERD (gastroesophageal reflux disease)    takes Omeprazole daily  . Hard of hearing    wears hearing aides   . History of blood transfusion    no abnormal reaction noted  . History of gastric ulcer   . Hypercholesteremia    takes Atorvastatin daily  . Hypertension    takes Coreg and Losartan daily  . Hypothyroidism    takes Synthroid daily  . IBS (irritable bowel syndrome)    takes Librax daily  . Joint pain   . Lumbosacral spondylosis without myelopathy   . Melanoma ([place])    on left leg, wide excision   . Muscle spasm    takes Robaxin daily as needed  . Neuromuscular disorder ([place])    esophageal spasms at times   . Osteoarthritis   . Peripheral edema    take HCTZ daily  . Pneumonia    hx of.Many yrs ago  . 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 Seasonal allergies    takes Allegra daily and uses Flonase daily as needed  . Shortness of breath dyspnea    with exertion  . Venous stasis   . Yeast infection    given Diflucan today   BP 138/69 (BP Location: Left Arm, Patient Position: Sitting, Cuff Size: Normal)   Pulse 73   Resp 14   LMP 11/03/1980 (Approximate)   SpO2 94%   Opioid Risk Score:  0 Fall Risk Score:  `1  Depression screen PHQ 2/9  Depression screen [facility] 2/9 07/21/2017 06/30/2017 02/19/2017 09/30/2016 12/13/2015  01/11/2015  Decreased Interest 0 0 0 0 0 0  Down, Depressed, Hopeless 0 0 0 0 0 0  PHQ - 2 Score 0 0 0 0 0 0  Altered sleeping - - - - - 0  Tired, decreased energy - - - - - 1  Change in appetite - - - - - 0  Feeling bad or failure about yourself  - - - - - 0  Trouble concentrating - - - - -  1  Moving slowly or fidgety/restless - - - - - 0  Suicidal thoughts - - - - - 0  PHQ-9 Score - - - - - 2    Review of Systems  Constitutional: Negative.   HENT: Negative.   Eyes: Negative.   Respiratory: Negative.   Cardiovascular: Negative.   Gastrointestinal: Negative.   Endocrine: Negative.   Genitourinary: Negative.   Musculoskeletal: Positive for back pain.  Skin: Negative.   Allergic/Immunologic: Negative.   Neurological: Negative.   Hematological: Negative.   Psychiatric/Behavioral: Negative.   All other systems reviewed and are negative.      Objective:   Physical Exam  Constitutional: She is oriented to person, place, and time. She appears well-developed and well-nourished.  HENT:  Head: Normocephalic and atraumatic.  Neck: Normal range of motion. Neck supple.  Cardiovascular: Normal rate and regular rhythm.  Pulmonary/Chest: Effort normal and breath sounds normal.  Musculoskeletal:  Normal Muscle Bulk and Muscle Testing Reveals: Upper Extremities: Full ROM and Muscle Strength 5/5 Back without spinal tenderness noted  Lower Extremities: Full ROM and Muscle Strength 5/5 Arises from Table with ease Narrow Based Gait  Neurological: She is alert and oriented to person, place, and time.  Skin: Skin is warm and dry.  Psychiatric: She has a normal mood and affect.  Nursing note and vitals reviewed.         Assessment & Plan:  1.Chronic low back pain with lumbar spondylosis. 09/30/2017 Refilled: Meperidine 50 mg one tablet three times a day #90.  We will continue the opioid monitoring program,this consists of regular clinic visits, examinations, urine drug screen,  pill counts as well as use of [place] Controlled Substance reporting System. 2. Lumbar Radiculitis: S/P  Lumbar Radiofrequency on 03/17/2017 with relief noted. Continue to monitor. 09/30/2017 3. Cervical stenosis with radiculitis : No complaints Today. S/P Anterior Cervical Fusion C3-C4 with Dr. Ellene Route on 10/03/2014. 09/30/2017 4. Chronic bilateral shoulde pain L>R: No complaints today.Continue with Current medication regime. 09/30/2017 S/P Left Reverse Total Shoulder Arthroplasty on 02/01/16 via Dr. Veverly Fells  15  minutes of face to face patient care time was spent during this visit. All questions were encouraged and answered.  F/U in 1 month

## 2017-10-22 ENCOUNTER — Ambulatory Visit: Payer: Medicare Other | Admitting: Registered Nurse

## 2017-10-29 ENCOUNTER — Encounter: Payer: Self-pay | Admitting: Registered Nurse

## 2017-10-29 ENCOUNTER — Encounter: Payer: Medicare Other | Attending: Physical Medicine and Rehabilitation | Admitting: Registered Nurse

## 2017-10-29 VITALS — BP 177/99 | HR 72 | Resp 14

## 2017-10-29 DIAGNOSIS — M47812 Spondylosis without myelopathy or radiculopathy, cervical region: Secondary | ICD-10-CM | POA: Insufficient documentation

## 2017-10-29 DIAGNOSIS — M47817 Spondylosis without myelopathy or radiculopathy, lumbosacral region: Secondary | ICD-10-CM

## 2017-10-29 DIAGNOSIS — Z981 Arthrodesis status: Secondary | ICD-10-CM

## 2017-10-29 DIAGNOSIS — Z5181 Encounter for therapeutic drug level monitoring: Secondary | ICD-10-CM | POA: Insufficient documentation

## 2017-10-29 DIAGNOSIS — M62838 Other muscle spasm: Secondary | ICD-10-CM

## 2017-10-29 DIAGNOSIS — M12811 Other specific arthropathies, not elsewhere classified, right shoulder: Secondary | ICD-10-CM | POA: Diagnosis present

## 2017-10-29 DIAGNOSIS — G894 Chronic pain syndrome: Secondary | ICD-10-CM | POA: Diagnosis not present

## 2017-10-29 DIAGNOSIS — M5412 Radiculopathy, cervical region: Secondary | ICD-10-CM | POA: Diagnosis not present

## 2017-10-29 DIAGNOSIS — M47816 Spondylosis without myelopathy or radiculopathy, lumbar region: Secondary | ICD-10-CM | POA: Insufficient documentation

## 2017-10-29 DIAGNOSIS — Z79899 Other long term (current) drug therapy: Secondary | ICD-10-CM | POA: Insufficient documentation

## 2017-10-29 DIAGNOSIS — M12812 Other specific arthropathies, not elsewhere classified, left shoulder: Secondary | ICD-10-CM | POA: Diagnosis present

## 2017-10-29 DIAGNOSIS — M542 Cervicalgia: Secondary | ICD-10-CM | POA: Diagnosis not present

## 2017-10-29 MED ORDER — METHOCARBAMOL 500 MG PO TABS: 500.0000 mg | ORAL_TABLET | Freq: Four times a day (QID) | ORAL | 2 refills | Status: DC | PRN

## 2017-10-29 MED ORDER — MEPERIDINE HCL 50 MG PO TABS: 50.0000 mg | ORAL_TABLET | Freq: Three times a day (TID) | ORAL | 0 refills | Status: DC

## 2017-10-29 NOTE — Progress Notes (Signed)
Subjective:    Patient ID: Jenna [name], female    DOB: 10/27/1933, 83 y.o.   MRN: [id number]  HPI: [name] is a 83 year old female who returns for follow up appointmentfor chronic pain and medication refill. She states her pain is located in her neck radiating into her right shoulder and upper back, also reports increase frequency of muscle spasm. We will prescribe Methocarbamol, she verbalizes understanding.She rates her pain 8. Her current exercise regime is walking.  Arrived hypertensive, blood pressure re-check, compliant with medication. Refuses ED/ Urgent care evaluation. Instructed to keep blood pressure log and call her PCP, she verbalizes understanding.   [name] is  15.00 MME.  She  is also prescribed Diazepam by Dr.Roberts, she uses it sporadically her  last prescription was picked up on 03/25/2017, according to PMP- Aware Website. We have reviewed the black box warning again regarding using opioids and benzodiazepines.I highlighted the dangers of using these drugs together and discussed the adverse events including respiratory suppression, overdose, cognitive impairment and importance of  compliance with current regimen. She verbalizes understanding, we will continue to monitor and adjust as indicated.    Last UDS was on 08/25/2017, it was inconsistent, all previous UDS have been consistent.    Pain Inventory Average Pain 5 Pain Right Now 8 My pain is intermittent, sharp and aching  In the last 24 hours, has pain interfered with the following? General activity 3 Relation with others 3 Enjoyment of life 3 What TIME of day is your pain at its worst? daytime and evening Sleep (in general) Fair  Pain is worse with: walking, standing and some activites Pain improves with: rest, heat/ice, pacing activities and medication Relief from Meds: 7  Mobility walk without assistance how many minutes can you walk? 15 ability to climb steps?   yes do you drive?  yes Do you have any goals in this area?  yes  Function retired Do you have any goals in this area?  no  Neuro/Psych No problems in this area  Prior Studies Any changes since last visit?  no  Physicians involved in your care Any changes since last visit?  no   Family History  Problem Relation Age of Onset  . Stroke Mother   . Heart attack Father   . Diabetes Brother   . Heart disease Brother    Social History   Socioeconomic History  . Marital status: Married    Spouse name: None  . Number of children: None  . Years of education: None  . Highest education level: None  Social Needs  . Financial resource strain: None  . Food insecurity - worry: None  . Food insecurity - inability: None  . Transportation needs - medical: None  . Transportation needs - non-medical: None  Occupational History  . None  Tobacco Use  . Smoking status: Former Smoker    Packs/day: 1.00    Years: 15.00    Pack years: 15.00  . Smokeless tobacco: Never Used  . Tobacco comment: quit smoking in 1985  Substance and Sexual Activity  . Alcohol use: Yes    Alcohol/week: 3.6 oz    Types: 6 Standard drinks or equivalent per week    Comment: occ wine w/ supper  . Drug use: No  . Sexual activity: Yes    Partners: Male    Birth control/protection: Surgical    Comment: hysterectomy  Other Topics Concern  . None  Social History  Narrative  . None   Past Surgical History:  Procedure Laterality Date  . ABDOMINAL HYSTERECTOMY  1982   BSO  . ANTERIOR CERVICAL DECOMP/DISCECTOMY FUSION N/A 10/03/2014   Procedure: ANTERIOR CERVICAL DECOMPRESSION/DISCECTOMY FUSION 1 LEVEL,CERVICAL THREE-FOUR;  Surgeon: [name], MD;  Location: [place] NEURO ORS;  Service: Neurosurgery;  Laterality: N/A;  . APPENDECTOMY  1986   along with chole  . BREAST BIOPSY Right 1997   sclerosing adenosis -Dr. Lucia Gaskins  . CHOLECYSTECTOMY    . COLONOSCOPY    . ESOPHAGOGASTRODUODENOSCOPY     with dilitation   . LIPOMA EXCISION Left 10/1999   left thigh  . lumbosacral cyst  2005   seen on injection for low back pain   . LUNG BIOPSY  05/2001   /w Dr.Burney- bronchoscopy, mediastinoscopy  . REVERSE SHOULDER ARTHROPLASTY Left 02/01/2016   Procedure: LEFT REVERSE TOTAL SHOULDER ARTHROPLASTY;  Surgeon: [name], MD;  Location: [place];  Service: Orthopedics;  Laterality: Left;  . rotator cuff  Bilateral 1996/1998   repair  . ROTATOR CUFF REPAIR Left 1996; 1981  . ROTATOR CUFF REPAIR Right 1996  . TONSILLECTOMY    . TOTAL HIP ARTHROPLASTY Left 10/2000  . TOTAL HIP ARTHROPLASTY Right 06/2005  . TOTAL HIP ARTHROPLASTY Left    10/2000  . VAGINAL DELIVERY     x1   Past Medical History:  Diagnosis Date  . Asthmatic bronchitis   . Cataracts, bilateral    immature  . Chronic back pain    arthritis.Stenosis  . Dysphagia   . Dysrhythmia    (?or extra beats) treated /w atenolol  . Emphysema lung ([place])    left  . GERD (gastroesophageal reflux disease)    takes Omeprazole daily  . Hard of hearing    wears hearing aides   . History of blood transfusion    no abnormal reaction noted  . History of gastric ulcer   . Hypercholesteremia    takes Atorvastatin daily  . Hypertension    takes Coreg and Losartan daily  . Hypothyroidism    takes Synthroid daily  . IBS (irritable bowel syndrome)    takes Librax daily  . Joint pain   . Lumbosacral spondylosis without myelopathy   . Melanoma ([place])    on left leg, wide excision   . Muscle spasm    takes Robaxin daily as needed  . Neuromuscular disorder ([place])    esophageal spasms at times   . Osteoarthritis   . Peripheral edema    take HCTZ daily  . Pneumonia    hx of.Many yrs ago  . 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 Seasonal allergies    takes Allegra daily and uses Flonase daily as needed  . Shortness of breath dyspnea    with exertion  . Venous stasis   . Yeast infection    given  Diflucan today   BP (!) 176/92 (BP Location: Left Arm, Patient Position: Sitting, Cuff Size: Normal)   Pulse (!) 58   Resp 14   LMP 11/03/1980 (Approximate)   SpO2 92%   Opioid Risk Score:  0 Fall Risk Score:  `1  Depression screen PHQ 2/9  Depression screen [facility] 2/9 07/21/2017 06/30/2017 02/19/2017 09/30/2016 12/13/2015 01/11/2015  Decreased Interest 0 0 0 0 0 0  Down, Depressed, Hopeless 0 0 0 0 0 0  PHQ - 2 Score 0 0 0 0 0 0  Altered sleeping - - - - -  0  Tired, decreased energy - - - - - 1  Change in appetite - - - - - 0  Feeling bad or failure about yourself  - - - - - 0  Trouble concentrating - - - - - 1  Moving slowly or fidgety/restless - - - - - 0  Suicidal thoughts - - - - - 0  PHQ-9 Score - - - - - 2    Review of Systems  Constitutional: Negative.   HENT: Negative.   Eyes: Negative.   Respiratory: Negative.   Cardiovascular: Negative.   Gastrointestinal: Negative.   Endocrine: Negative.   Genitourinary: Negative.   Musculoskeletal: Positive for back pain, neck pain and neck stiffness.  Skin: Negative.   Allergic/Immunologic: Negative.   Neurological: Negative.   Hematological: Negative.   Psychiatric/Behavioral: Negative.   All other systems reviewed and are negative.      Objective:   Physical Exam  Constitutional: She is oriented to person, place, and time. She appears well-developed and well-nourished.  HENT:  Head: Normocephalic and atraumatic.  Neck: Normal range of motion. Neck supple.  Cervical Paraspinal Tenderness: C-5-C-6 Mainly Right Side  Cardiovascular: Normal rate and regular rhythm.  Pulmonary/Chest: Effort normal and breath sounds normal.  Musculoskeletal:  Normal Muscle Bulk and Muscle Testing Reveals: Upper Extremities: Full ROM and Muscle Strength 5/5 Thoracic Paraspinal Tenderness: T-1-T-4 Mainly Right Side Lumbar without spinal tenderness noted  Lower Extremities: Full ROM and Muscle Strength 5/5 Arises from Table with ease Narrow  Based Gait  Neurological: She is alert and oriented to person, place, and time.  Skin: Skin is warm and dry.  Psychiatric: She has a normal mood and affect.  Nursing note and vitals reviewed.         Assessment & Plan:  1.Chronic low back pain with lumbar spondylosis. 10/29/2017 Refilled: Meperidine 50 mg one tablet three times a day #90.  We will continue the opioid monitoring program,this consists of regular clinic visits, examinations, urine drug screen, pill counts as well as use of [place] Controlled Substance reporting System. 2. Lumbar Radiculitis: S/P Lumbar Radiofrequency on 03/17/2017 with relief noted. Continue to monitor. 10/29/2017 3. Cervicalgia/Cervical stenosis with radiculitis/ S/P Anterior Cervical Fusion C3-C4 with Dr. Ellene Route on 10/03/2014. Continue to Monitor. Refuses Gabapentin at this time. 10/29/2017 4. Chronic bilateral shoulder pain L>R: Right Shoulder Pain: Continue with Current medication regime. 10/29/2017 S/P Left Reverse Total Shoulder Arthroplasty on 02/01/16 via Dr. Veverly Fells 5. Muscle Spasm: RX: Methocarbamol.   20  minutes of face to face patient care time was spent during this visit. All questions were encouraged and answered.  F/U in 1 month

## 2017-10-30 ENCOUNTER — Telehealth: Payer: Self-pay | Admitting: Physical Medicine & Rehabilitation

## 2017-10-30 MED ORDER — NORTRIPTYLINE HCL 10 MG PO CAPS: 10.0000 mg | ORAL_CAPSULE | Freq: Every day | ORAL | 2 refills | Status: DC

## 2017-10-30 NOTE — Telephone Encounter (Signed)
Pt phoned to state that in her appointment yesterday you had mentioned giving her another type of Rx for her nerves. Pt wishes to have this Rx filled. Pt believes it may have been Lyrica. Please advise.

## 2017-10-30 NOTE — Telephone Encounter (Signed)
Return Jenna [name] call, we discussed her cervical radiculitis on 10/29/2017,  Still experiencing increase intensity of neuropathic pain. We will prescribe Pamelor, she verbalizes understanding.

## 2017-11-02 ENCOUNTER — Telehealth: Payer: Self-pay | Admitting: Physical Medicine & Rehabilitation

## 2017-11-02 NOTE — Telephone Encounter (Signed)
Patient having increased neck pain and the Robaxin is helping some, but not entirely.  She has been told by Dr. Ellene Route that she would need to have surgery.  She would like for Jenna [name] to call her when she returns to office on Wednesday.  If Jenna [name] could please call her late in the afternoon, due to she has a physical appointment that day.

## 2017-11-03 DIAGNOSIS — I4891 Unspecified atrial fibrillation: Secondary | ICD-10-CM

## 2017-11-03 HISTORY — DX: Unspecified atrial fibrillation: I48.91

## 2017-11-04 ENCOUNTER — Telehealth: Payer: Self-pay

## 2017-11-04 NOTE — Telephone Encounter (Signed)
Return Jenna [name] call, she reports when she took  the Pamelor she developed jerking movement into right lower extremity ( ? Restless Leg Syndrome). She has discontinued the Pamelor. At this time she is going to call  Dr. Ellene Route, she is still having neck pain radiating into her right shoulder and right arm with tingling and burning sensation. At this time she wants to hold off on Gabapentin until she hears from Dr. Ellene Route. She was encouraged to keep office updated, she verbalizes understanding.

## 2017-11-04 NOTE — Telephone Encounter (Signed)
Recieved prior auth request for pamalor, according to last telephone encounter:  Return Ms. Mcclune call, she reports when she took  the Pamelor she developed jerking movement into right lower extremity ( ? Restless Leg Syndrome). She has discontinued the Pamelor. At this time she is going to call  Dr. Ellene Route, she is still having neck pain radiating into her right shoulder and right arm with tingling and burning sensation. At this time she wants to hold off on Gabapentin until she hears from Dr. Ellene Route. She was encouraged to keep office updated, she verbalizes understanding.        Prior auth not conducted.

## 2017-11-12 ENCOUNTER — Ambulatory Visit: Payer: Medicare Other | Admitting: Certified Nurse Midwife

## 2017-11-23 ENCOUNTER — Other Ambulatory Visit: Payer: Self-pay

## 2017-11-23 ENCOUNTER — Encounter: Payer: Self-pay | Admitting: Physical Medicine & Rehabilitation

## 2017-11-23 ENCOUNTER — Ambulatory Visit: Payer: Medicare Other | Admitting: Physical Medicine & Rehabilitation

## 2017-11-23 ENCOUNTER — Encounter: Payer: Medicare Other | Attending: Physical Medicine and Rehabilitation

## 2017-11-23 VITALS — BP 161/89 | HR 47

## 2017-11-23 DIAGNOSIS — Z5181 Encounter for therapeutic drug level monitoring: Secondary | ICD-10-CM | POA: Diagnosis not present

## 2017-11-23 DIAGNOSIS — M47812 Spondylosis without myelopathy or radiculopathy, cervical region: Secondary | ICD-10-CM | POA: Insufficient documentation

## 2017-11-23 DIAGNOSIS — G894 Chronic pain syndrome: Secondary | ICD-10-CM | POA: Diagnosis not present

## 2017-11-23 DIAGNOSIS — M47816 Spondylosis without myelopathy or radiculopathy, lumbar region: Secondary | ICD-10-CM | POA: Insufficient documentation

## 2017-11-23 DIAGNOSIS — M12812 Other specific arthropathies, not elsewhere classified, left shoulder: Secondary | ICD-10-CM | POA: Diagnosis present

## 2017-11-23 DIAGNOSIS — M12811 Other specific arthropathies, not elsewhere classified, right shoulder: Secondary | ICD-10-CM | POA: Insufficient documentation

## 2017-11-23 DIAGNOSIS — M4712 Other spondylosis with myelopathy, cervical region: Secondary | ICD-10-CM

## 2017-11-23 DIAGNOSIS — Z79899 Other long term (current) drug therapy: Secondary | ICD-10-CM | POA: Diagnosis present

## 2017-11-23 DIAGNOSIS — M47817 Spondylosis without myelopathy or radiculopathy, lumbosacral region: Secondary | ICD-10-CM

## 2017-11-23 MED ORDER — MEPERIDINE HCL 50 MG PO TABS: 50.0000 mg | ORAL_TABLET | Freq: Three times a day (TID) | ORAL | 0 refills | Status: DC

## 2017-11-23 NOTE — Patient Instructions (Signed)
Next month we will change demerol to 75 tablets

## 2017-11-23 NOTE — Progress Notes (Signed)
Oral swab cancelled and urine drug screen ordered per Dr Letta Pate.

## 2017-11-23 NOTE — Progress Notes (Signed)
Subjective:    Patient ID: Jenna [name], female    DOB: 03-21-1933, 84 y.o.   MRN: [id number]  HPI   84 year old female with chronic pain syndrome.  She has a history of lumbar spondylosis which has responded to lumbar radiofrequency procedure last performed on 03/17/2017 at right L5 dorsal ramus right L4 and right L3 medial branches. In addition patient has a history of cervical fusion and of late she has had aggravation of her cervical pain. Right sided neck pain radiating to shoulder and wrist, itching and weakness in shoulder, occurred since Christmas, feels a little better, more intermittent Taking Ibuprofen and 2-3 demerol per day Patient has an appointment with her neurosurgeon Dr. [name]. Losing weight ~14lb, not as active Valium 5mg  on Sunday prescribed by PCP Pain Inventory Average Pain 6 Pain Right Now 6 My pain is intermittent, sharp, tingling and aching  In the last 24 hours, has pain interfered with the following? General activity 6 Relation with others 3 Enjoyment of life 7 What TIME of day is your pain at its worst? daytime and evening Sleep (in general) Poor  Pain is worse with: standing and some activites Pain improves with: heat/ice, pacing activities and medication Relief from Meds: 5  Mobility walk without assistance how many minutes can you walk? 10-15 ability to climb steps?  yes do you drive?  yes  Function retired I need assistance with the following:  household duties  Neuro/Psych weakness tingling  Prior Studies Any changes since last visit?  no  Physicians involved in your care Any changes since last visit?  no   Family History  Problem Relation Age of Onset  . Stroke Mother   . Heart attack Father   . Diabetes Brother   . Heart disease Brother    Social History   Socioeconomic History  . Marital status: Married    Spouse name: None  . Number of children: None  . Years of education: None  . Highest education level:  None  Social Needs  . Financial resource strain: None  . Food insecurity - worry: None  . Food insecurity - inability: None  . Transportation needs - medical: None  . Transportation needs - non-medical: None  Occupational History  . None  Tobacco Use  . Smoking status: Former Smoker    Packs/day: 1.00    Years: 15.00    Pack years: 15.00  . Smokeless tobacco: Never Used  . Tobacco comment: quit smoking in 1985  Substance and Sexual Activity  . Alcohol use: Yes    Alcohol/week: 3.6 oz    Types: 6 Standard drinks or equivalent per week    Comment: occ wine w/ supper  . Drug use: No  . Sexual activity: Yes    Partners: Male    Birth control/protection: Surgical    Comment: hysterectomy  Other Topics Concern  . None  Social History Narrative  . None   Past Surgical History:  Procedure Laterality Date  . ABDOMINAL HYSTERECTOMY  1982   BSO  . ANTERIOR CERVICAL DECOMP/DISCECTOMY FUSION N/A 10/03/2014   Procedure: ANTERIOR CERVICAL DECOMPRESSION/DISCECTOMY FUSION 1 LEVEL,CERVICAL THREE-FOUR;  Surgeon: [name], MD;  Location: [facility];  Service: Neurosurgery;  Laterality: N/A;  . APPENDECTOMY  1986   along with chole  . BREAST BIOPSY Right 1997   sclerosing adenosis -Dr. Lucia Gaskins  . CHOLECYSTECTOMY    . COLONOSCOPY    . ESOPHAGOGASTRODUODENOSCOPY     with dilitation  . LIPOMA EXCISION  Left 10/1999   left thigh  . lumbosacral cyst  2005   seen on injection for low back pain   . LUNG BIOPSY  05/2001   /w Dr.Burney- bronchoscopy, mediastinoscopy  . REVERSE SHOULDER ARTHROPLASTY Left 02/01/2016   Procedure: LEFT REVERSE TOTAL SHOULDER ARTHROPLASTY;  Surgeon: [name], MD;  Location: [facility];  Service: Orthopedics;  Laterality: Left;  . rotator cuff  Bilateral 1996/1998   repair  . ROTATOR CUFF REPAIR Left 1996; 1981  . ROTATOR CUFF REPAIR Right 1996  . TONSILLECTOMY    . TOTAL HIP ARTHROPLASTY Left 10/2000  . TOTAL HIP ARTHROPLASTY Right 06/2005  . TOTAL HIP  ARTHROPLASTY Left    10/2000  . VAGINAL DELIVERY     x1   Past Medical History:  Diagnosis Date  . Asthmatic bronchitis   . Cataracts, bilateral    immature  . Chronic back pain    arthritis.Stenosis  . Dysphagia   . Dysrhythmia    (?or extra beats) treated /w atenolol  . Emphysema lung ([place])    left  . GERD (gastroesophageal reflux disease)    takes Omeprazole daily  . Hard of hearing    wears hearing aides   . History of blood transfusion    no abnormal reaction noted  . History of gastric ulcer   . Hypercholesteremia    takes Atorvastatin daily  . Hypertension    takes Coreg and Losartan daily  . Hypothyroidism    takes Synthroid daily  . IBS (irritable bowel syndrome)    takes Librax daily  . Joint pain   . Lumbosacral spondylosis without myelopathy   . Melanoma ([place])    on left leg, wide excision   . Muscle spasm    takes Robaxin daily as needed  . Neuromuscular disorder ([place])    esophageal spasms at times   . Osteoarthritis   . Peripheral edema    take HCTZ daily  . Pneumonia    hx of.Many yrs ago  . 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 Seasonal allergies    takes Allegra daily and uses Flonase daily as needed  . Shortness of breath dyspnea    with exertion  . Venous stasis   . Yeast infection    given Diflucan today   BP (!) 161/89   Pulse (!) 47   LMP 11/03/1980 (Approximate)   SpO2 96%   Opioid Risk Score:  0 Fall Risk Score:  `1  Depression screen PHQ 2/9  Depression screen [facility] 2/9 11/23/2017 07/21/2017 06/30/2017 02/19/2017 09/30/2016 12/13/2015 01/11/2015  Decreased Interest 0 0 0 0 0 0 0  Down, Depressed, Hopeless 0 0 0 0 0 0 0  PHQ - 2 Score 0 0 0 0 0 0 0  Altered sleeping - - - - - - 0  Tired, decreased energy - - - - - - 1  Change in appetite - - - - - - 0  Feeling bad or failure about yourself  - - - - - - 0  Trouble concentrating - - - - - - 1  Moving slowly or fidgety/restless - - -  - - - 0  Suicidal thoughts - - - - - - 0  PHQ-9 Score - - - - - - 2    Review of Systems  HENT: Negative.   Eyes: Negative.   Respiratory: Negative.   Cardiovascular: Negative.   Gastrointestinal: Negative.   Endocrine: Negative.  Genitourinary: Negative.   Musculoskeletal: Negative.   Skin: Negative.   Allergic/Immunologic: Negative.   Neurological: Positive for weakness.       Tingling  Hematological: Negative.   Psychiatric/Behavioral: Negative.   All other systems reviewed and are negative.      Objective:   Physical Exam  Right C5 and C6 sensory deficits General no acute distress Mood and affect are appropriate Motor strength is 4/5 in the right bicep 3- at the right deltoid 5 at the right finger flexors and wrist extensor 5 at the hip flexor knee extensor ankle dorsiflexor bilaterally and 5 at the left deltoid bicep tricep grip. Gait without evidence of toe drag or knee instability Lumbar spine has no tenderness palpation she has full lumbar flexion extension limited to 75% lateral bending 75%     Assessment & Plan:  1.  Right C5-C6 cervical radiculopathy.  She has both sensory and motor symptoms.  She will follow-up with neurosurgery on this has an appointment in the next couple days. 2.  Lumbar spondylosis without myelopathy symptomatically doing well.  No need for repeat lumbar radiofrequency procedure at the current time.  3.  Chronic pain syndrome multifactorial on average she is taking about 75 tabs of Demerol 50mg  per month.  Because of her exacerbation of neck pain will continue with 90 tablets this month but next month she will go down to 75 tablets. Last urine drug screen 03/17/2017 demonstrating expected medications however the oral swab performed in October did not show any Demerol or metabolites Will repeat urine drug screen. We discussed that she is taking less than 3 pills on average per month for couple months.  She denies any "stockpiled medication"  at home.  We discussed that going forward we will prescribe only the amount she is actually taking each month.

## 2017-11-27 LAB — TOXASSURE SELECT,+ANTIDEPR,UR

## 2017-11-30 ENCOUNTER — Ambulatory Visit: Payer: Medicare Other

## 2017-11-30 ENCOUNTER — Ambulatory Visit: Payer: Medicare Other | Admitting: Physical Medicine & Rehabilitation

## 2017-12-01 ENCOUNTER — Ambulatory Visit: Payer: Medicare Other | Admitting: Physical Medicine & Rehabilitation

## 2017-12-01 ENCOUNTER — Ambulatory Visit: Payer: Medicare Other

## 2017-12-01 ENCOUNTER — Other Ambulatory Visit: Payer: Self-pay | Admitting: Neurological Surgery

## 2017-12-01 DIAGNOSIS — M5000 Cervical disc disorder with myelopathy, unspecified cervical region: Secondary | ICD-10-CM

## 2017-12-02 ENCOUNTER — Telehealth: Payer: Self-pay | Admitting: *Deleted

## 2017-12-02 NOTE — Telephone Encounter (Signed)
Urine drug screen for this encounter is consistent for prescribed medication.  This is a repeat urine test after a negative swab.

## 2017-12-03 ENCOUNTER — Other Ambulatory Visit: Payer: Self-pay

## 2017-12-03 NOTE — Telephone Encounter (Signed)
Please keep a log  Thanks

## 2017-12-03 NOTE — Telephone Encounter (Signed)
[facility] faxed refill for patient as follows:  Medication refill request: Estradiol 0.025mg  patch #[id number] Last AEX:  08-05-16 Next AEX: 12-31-17 Last MMG (if hormonal medication request): 05-19-17 [id number] Refill authorized: Please advise

## 2017-12-04 ENCOUNTER — Telehealth: Payer: Self-pay | Admitting: Certified Nurse Midwife

## 2017-12-04 MED ORDER — ESTRADIOL 0.025 MG/24HR TD PTTW: MEDICATED_PATCH | TRANSDERMAL | 0 refills | Status: DC

## 2017-12-04 NOTE — Telephone Encounter (Signed)
Spoke with patient and stated [facility]. Patient states it should have been sent to [facility].  [facility] and authorized refill Estradiol 0.025mg  #4, 0 refills.  Advised patient it has been taken care of.

## 2017-12-04 NOTE — Telephone Encounter (Signed)
Patient requesting refill on estradiol patches. She is scheduled to see Melvia Heaps on 12/31/2017. Please advise.

## 2017-12-11 ENCOUNTER — Ambulatory Visit
Admission: RE | Admit: 2017-12-11 | Discharge: 2017-12-11 | Disposition: A | Discharge status: Home / Self Care | Payer: Medicare Other | Source: Ambulatory Visit | Attending: Neurological Surgery | Admitting: Neurological Surgery

## 2017-12-11 DIAGNOSIS — M5000 Cervical disc disorder with myelopathy, unspecified cervical region: Secondary | ICD-10-CM

## 2017-12-14 ENCOUNTER — Telehealth: Payer: Self-pay | Admitting: Physical Medicine & Rehabilitation

## 2017-12-14 MED ORDER — MEPERIDINE HCL 50 MG PO TABS: 50.0000 mg | ORAL_TABLET | Freq: Three times a day (TID) | ORAL | 0 refills | Status: DC

## 2017-12-14 NOTE — Telephone Encounter (Signed)
[facility] [address] [facility] [address] [facility] - they cannot transfer the RX because it was EScribe  - can you please Escribe it to other pharmacy notify her at [phone]

## 2017-12-14 NOTE — Telephone Encounter (Signed)
Placed a call to Jenna [name], [facility] reports [facility] [address] [facility], it's on back-order. She wanted the [facility]peridine [facility] [address]. This will be done. [name] is aware.

## 2017-12-22 ENCOUNTER — Ambulatory Visit: Payer: Medicare Other | Admitting: Registered Nurse

## 2017-12-24 ENCOUNTER — Encounter: Payer: Medicare Other | Admitting: Registered Nurse

## 2017-12-31 ENCOUNTER — Ambulatory Visit (INDEPENDENT_AMBULATORY_CARE_PROVIDER_SITE_OTHER): Payer: Medicare Other | Admitting: Certified Nurse Midwife

## 2017-12-31 ENCOUNTER — Other Ambulatory Visit: Payer: Self-pay

## 2017-12-31 ENCOUNTER — Encounter: Payer: Self-pay | Admitting: Certified Nurse Midwife

## 2017-12-31 VITALS — BP 122/74 | HR 70 | Resp 16 | Ht 61.25 in | Wt 138.0 lb

## 2017-12-31 DIAGNOSIS — Z8679 Personal history of other diseases of the circulatory system: Secondary | ICD-10-CM | POA: Diagnosis not present

## 2017-12-31 DIAGNOSIS — Z79890 Hormone replacement therapy (postmenopausal): Secondary | ICD-10-CM

## 2017-12-31 DIAGNOSIS — Z8739 Personal history of other diseases of the musculoskeletal system and connective tissue: Secondary | ICD-10-CM

## 2017-12-31 DIAGNOSIS — Z01419 Encounter for gynecological examination (general) (routine) without abnormal findings: Secondary | ICD-10-CM | POA: Diagnosis not present

## 2017-12-31 DIAGNOSIS — Z8639 Personal history of other endocrine, nutritional and metabolic disease: Secondary | ICD-10-CM | POA: Diagnosis not present

## 2017-12-31 DIAGNOSIS — N951 Menopausal and female climacteric states: Secondary | ICD-10-CM | POA: Diagnosis not present

## 2017-12-31 MED ORDER — ESTRADIOL 0.025 MG/24HR TD PTTW: MEDICATED_PATCH | TRANSDERMAL | 6 refills | Status: DC

## 2017-12-31 NOTE — Patient Instructions (Signed)

## 2017-12-31 NOTE — Progress Notes (Signed)
84 y.o. G29P1001 Married  Caucasian Fe here for annual exam. Menopausal on ERT. Denies vaginal bleeding or vaginal dryness. She had stopped using Premarin cream due to the cost and no issues. She is using the patch every 10 to 12 days only.Not twice weekly. Sees PCP for aex, labs and hypertension,cholesterol, hypothyroid,anxiety medication management. All stable per patient. Spouse undergoing lymph node biopsy for cancer. Previous cancer of colon and treated. Patient "worried now". Ambulates with issues, drives with no issues, eating well. No health issues.today.  Patient's last menstrual period was 11/03/1980 (approximate).          Sexually active: No.  The current method of family planning is status post hysterectomy.    Exercising: No.  exercise Smoker:  no  Health Maintenance: Pap:  2002 neg History of Abnormal Pap: no MMG:  05-19-17 category b density birads 1:neg Self Breast exams: yes Colonoscopy:  2004, inability to swallow prep BMD:   2018 TDaP:  2013 Shingles: 2016 Pneumonia: had done Hep C and HIV: not done Labs: if needed   reports that she has quit smoking. She has a 15.00 pack-year smoking history. she has never used smokeless tobacco. She reports that she does not drink alcohol or use drugs.  Past Medical History:  Diagnosis Date  . Asthmatic bronchitis   . Cataracts, bilateral    immature  . Chronic back pain    arthritis.Stenosis  . Dysphagia   . Dysrhythmia    (?or extra beats) treated /w atenolol  . Emphysema lung ([place])    left  . GERD (gastroesophageal reflux disease)    takes Omeprazole daily  . Hard of hearing    wears hearing aides   . History of blood transfusion    no abnormal reaction noted  . History of gastric ulcer   . Hypercholesteremia    takes Atorvastatin daily  . Hypertension    takes Coreg and Losartan daily  . Hypothyroidism    takes Synthroid daily  . IBS (irritable bowel syndrome)    takes Librax daily  . Joint pain   .  Lumbosacral spondylosis without myelopathy   . Melanoma ([place])    on left leg, wide excision   . Muscle spasm    takes Robaxin daily as needed  . Neuromuscular disorder ([place])    esophageal spasms at times   . Osteoarthritis   . Peripheral edema    take HCTZ daily  . Pneumonia    hx of.Many yrs ago  . 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 Seasonal allergies    takes Allegra daily and uses Flonase daily as needed  . Shortness of breath dyspnea    with exertion  . Venous stasis   . Yeast infection    given Diflucan today    Past Surgical History:  Procedure Laterality Date  . ABDOMINAL HYSTERECTOMY  1982   BSO  . ANTERIOR CERVICAL DECOMP/DISCECTOMY FUSION N/A 10/03/2014   Procedure: ANTERIOR CERVICAL DECOMPRESSION/DISCECTOMY FUSION 1 LEVEL,CERVICAL THREE-FOUR;  Surgeon: [name], MD;  Location: [facility];  Service: Neurosurgery;  Laterality: N/A;  . APPENDECTOMY  1986   along with chole  . BREAST BIOPSY Right 1997   sclerosing adenosis -Dr. Lucia Gaskins  . CHOLECYSTECTOMY    . COLONOSCOPY    . ESOPHAGOGASTRODUODENOSCOPY     with dilitation  . LIPOMA EXCISION Left 10/1999   left thigh  . lumbosacral cyst  2005   seen on injection  for low back pain   . LUNG BIOPSY  05/2001   /w Dr.Burney- bronchoscopy, mediastinoscopy  . REVERSE SHOULDER ARTHROPLASTY Left 02/01/2016   Procedure: LEFT REVERSE TOTAL SHOULDER ARTHROPLASTY;  Surgeon: [name], MD;  Location: [facility];  Service: Orthopedics;  Laterality: Left;  . rotator cuff  Bilateral 1996/1998   repair  . ROTATOR CUFF REPAIR Left 1996; 1981  . ROTATOR CUFF REPAIR Right 1996  . TONSILLECTOMY    . TOTAL HIP ARTHROPLASTY Left 10/2000  . TOTAL HIP ARTHROPLASTY Right 06/2005  . TOTAL HIP ARTHROPLASTY Left    10/2000  . VAGINAL DELIVERY     x1    Current Outpatient Medications  Medication Sig Dispense Refill  . amLODipine (NORVASC) 2.5 MG tablet Take 1 tablet by mouth daily.   0  . aspirin 81 MG tablet Take 81 mg by mouth daily.     [name] atorvastatin (LIPITOR) 80 MG tablet Take 40 mg by mouth daily.    . calcium carbonate (OS-CAL) 600 MG TABS Take 600 mg by mouth 2 (two) times daily.    . carvedilol (COREG) 12.5 MG tablet Take 12.5 mg by mouth 2 (two) times daily with a meal.    . cholecalciferol (VITAMIN D) 1000 UNITS tablet Take 1,000 Units by mouth daily.    . clidinium-chlordiazePOXIDE (LIBRAX) 5-2.5 MG per capsule Take 1 capsule by mouth 3 (three) times daily as needed (for IBS).    [name] conjugated estrogens (PREMARIN) vaginal cream Use 1/2 g vaginally twice weekly 42.5 g 3  . diazepam (VALIUM) 5 MG tablet Take 2.5 mg by mouth at bedtime as needed for sedation.     . diclofenac sodium (VOLTAREN) 1 % GEL Apply 2 g topically 4 (four) times daily. 3 Tube 1  . estradiol (VIVELLE-DOT) 0.025 MG/24HR Apply one patch weekly 4 patch 0  . fexofenadine (ALLEGRA) 180 MG tablet Take 180 mg by mouth daily.    . fluticasone (FLONASE) 50 MCG/ACT nasal spray Place 2 sprays into both nostrils daily as needed for allergies.     . hydrochlorothiazide (MICROZIDE) 12.5 MG capsule Take 1 capsule (12.5 mg total) by mouth daily. 30 capsule 0  . hyoscyamine (LEVSIN SL) 0.125 MG SL tablet Place 0.125-0.25 mg under the tongue every 4 (four) hours as needed for cramping.     [name] ibuprofen (ADVIL,MOTRIN) 200 MG tablet Take 400 mg by mouth every 6 (six) hours as needed (for pain).     [name] levothyroxine (SYNTHROID, LEVOTHROID) 112 MCG tablet Take 112 mcg by mouth daily before breakfast.    . losartan (COZAAR) 100 MG tablet Take 1 tablet (100 mg total) by mouth daily. 30 tablet 0  . meperidine (DEMEROL) 50 MG tablet Take 1 tablet (50 mg total) by mouth 3 (three) times daily. 90 tablet 0  . methocarbamol (ROBAXIN) 500 MG tablet Take 1 tablet (500 mg total) by mouth every 6 (six) hours as needed for muscle spasms. 60 tablet 2  . naphazoline (NAPHCON) 0.1 % ophthalmic solution Place 1 drop into both eyes  daily as needed for irritation.    [name] omeprazole (PRILOSEC) 40 MG capsule Take 40 mg by mouth 2 (two) times daily.     . promethazine (PHENERGAN) 25 MG tablet TAKE 1 TABLET BY MOUTH TWICE DAILY AS NEEDED. 60 tablet 11   No current facility-administered medications for this visit.     Family History  Problem Relation Age of Onset  . Stroke Mother   . Heart attack Father   .  Diabetes Brother   . Heart disease Brother     ROS:  Pertinent items are noted in HPI.  Otherwise, a comprehensive ROS was negative.  Exam:   BP 122/74   Pulse 70   Resp 16   Ht 5' 1.25" (1.556 m)   Wt 138 lb (62.6 kg)   LMP 11/03/1980 (Approximate)   BMI 25.86 kg/m  Height: 5' 1.25" (155.6 cm) Ht Readings from Last 3 Encounters:  12/31/17 5' 1.25" (1.556 m)  08/05/16 5' 1.75" (1.568 m)  02/01/16 5' 2.5" (1.588 m)    General appearance: alert, cooperative and appears stated age Head: Normocephalic, without obvious abnormality, atraumatic Neck: no adenopathy, supple, symmetrical, trachea midline and thyroid normal to inspection and palpation Lungs: clear to auscultation bilaterally Breasts: normal appearance, no masses or tenderness, No nipple retraction or dimpling, No nipple discharge or bleeding, No axillary or supraclavicular adenopathy Heart: regular rate and rhythm Abdomen: soft, non-tender; no masses,  no organomegaly Extremities: extremities normal, atraumatic, no cyanosis or edema Skin: Skin color, texture, turgor normal. No rashes or lesions Lymph nodes: Cervical, supraclavicular, and axillary nodes normal. No abnormal inguinal nodes palpated Neurologic: Grossly normal   Pelvic: External genitalia:  no lesions              Urethra:  normal appearing urethra with no masses, tenderness or lesions              Bartholin's and Skene's: normal                 Vagina: normal appearing vagina with normal color and discharge, no lesions, good moisture present, no atrophy              Cervix:  absent              Pap taken: No. Bimanual Exam:  Uterus:  uterus absent              Adnexa: no mass, fullness, tenderness and ovaries surgically removed               Rectovaginal: Confirms               Anus:  normal sphincter tone, no lesions  Chaperone present: yes  A:  Well Woman with normal exam  Post menopausal s/p TAH with BSO on ERT only, desires continuance for bone health and vasomotor relief.        Hypothyroid,hypertension,cholesterol, joint and back issues with MD management  Social stress with spouse ? cancer.   P:   Reviewed health and wellness pertinent to exam  Discussed risks/benefits/warning signs and need for annual mammogram and notification if cardiovascular status change and would need to stop ERT. Patient understands risks and feels quality of life is important.  Rx Vivelle dot see order with instructions.  Premarin cream not renewed, not felt needed after exam.  Continue follow up with MD as indicated.  Pap smear: no   counseled on breast self exam, mammography screening, feminine hygiene, use and side effects of HRT, adequate intake of calcium and vitamin D, diet and exercise, Kegel's exercises  return annually or prn  An After Visit Summary was printed and given to the patient.

## 2018-01-01 ENCOUNTER — Telehealth: Payer: Self-pay | Admitting: Certified Nurse Midwife

## 2018-01-01 MED ORDER — ESTRADIOL 0.025 MG/24HR TD PTWK: 0.0250 mg | MEDICATED_PATCH | TRANSDERMAL | 6 refills | Status: DC

## 2018-01-01 NOTE — Telephone Encounter (Signed)
Spoke with [place] at [facility]. Calling to clarify instructions for Vivelle-dot 0.025 mg apply one patch weekly.   Was advised this RX is recommended for twice weekly and comes in 8 count box. If patient only using weekly, recommends RX for Climara 0.025mg  weekly patch. Advised will review with provider and return call.   Melvia Heaps, CNM -please review and advise?

## 2018-01-01 NOTE — Telephone Encounter (Signed)
Patient is only using one every 10 days. Ok to switch to [employer], but she is only using one every 10 days and I am agreeable to this

## 2018-01-01 NOTE — Telephone Encounter (Signed)
Spoke with [place] at [facility]. She is wanting to confirm dosage for patch prescription. Last seen 12/31/17

## 2018-01-01 NOTE — Telephone Encounter (Signed)
Spoke with patient, advised as seen below per pharmacy  And Melvia Heaps, CNM. Patient agreeable to RX for Climara 0.025 mg patch. New Rx to [facility]. Patient is concerned with cost, will f/u with pharmacy regarding cost, will return call to office with any additional questions/concerns. Patient verbalizes understanding and is agreeable.   Call returned to [name], spoke with Cyndee Brightly. Advised new Rx sent for climara, d/c vivelle-dot.   Routing to [employer], CNM, will close encounter.

## 2018-01-01 NOTE — Telephone Encounter (Signed)
Also Rx should read once weekly

## 2018-01-06 ENCOUNTER — Encounter: Payer: Self-pay | Admitting: Registered Nurse

## 2018-01-06 ENCOUNTER — Encounter: Payer: Medicare Other | Attending: Physical Medicine and Rehabilitation | Admitting: Registered Nurse

## 2018-01-06 VITALS — BP 137/64 | HR 73

## 2018-01-06 DIAGNOSIS — M542 Cervicalgia: Secondary | ICD-10-CM

## 2018-01-06 DIAGNOSIS — M62838 Other muscle spasm: Secondary | ICD-10-CM | POA: Diagnosis not present

## 2018-01-06 DIAGNOSIS — M12812 Other specific arthropathies, not elsewhere classified, left shoulder: Secondary | ICD-10-CM | POA: Diagnosis present

## 2018-01-06 DIAGNOSIS — M5412 Radiculopathy, cervical region: Secondary | ICD-10-CM

## 2018-01-06 DIAGNOSIS — M4712 Other spondylosis with myelopathy, cervical region: Secondary | ICD-10-CM | POA: Diagnosis not present

## 2018-01-06 DIAGNOSIS — M47817 Spondylosis without myelopathy or radiculopathy, lumbosacral region: Secondary | ICD-10-CM | POA: Diagnosis not present

## 2018-01-06 DIAGNOSIS — Z79899 Other long term (current) drug therapy: Secondary | ICD-10-CM | POA: Diagnosis present

## 2018-01-06 DIAGNOSIS — Z981 Arthrodesis status: Secondary | ICD-10-CM

## 2018-01-06 DIAGNOSIS — M47812 Spondylosis without myelopathy or radiculopathy, cervical region: Secondary | ICD-10-CM | POA: Insufficient documentation

## 2018-01-06 DIAGNOSIS — M47816 Spondylosis without myelopathy or radiculopathy, lumbar region: Secondary | ICD-10-CM | POA: Insufficient documentation

## 2018-01-06 DIAGNOSIS — G894 Chronic pain syndrome: Secondary | ICD-10-CM | POA: Diagnosis not present

## 2018-01-06 DIAGNOSIS — Z5181 Encounter for therapeutic drug level monitoring: Secondary | ICD-10-CM

## 2018-01-06 DIAGNOSIS — M12811 Other specific arthropathies, not elsewhere classified, right shoulder: Secondary | ICD-10-CM | POA: Insufficient documentation

## 2018-01-06 MED ORDER — MEPERIDINE HCL 50 MG PO TABS: 50.0000 mg | ORAL_TABLET | Freq: Three times a day (TID) | ORAL | 0 refills | Status: DC

## 2018-01-06 NOTE — Progress Notes (Signed)
Subjective:    Patient ID: Jenna [name], female    DOB: August 29, 1933, 84 y.o.   MRN: [id number]  HPI: Jenna [name] is a 84 year old female who returns for follow up appointmentfor chronic pain and medication refill. She states her pain is located in her neck radiating into her right shoulder and right arm with tingling and numbness and lower back pain. Jenna [name] states she seen Dr. Ellene Route she will be scheduled for Cervical Fusion, she doesn't have a surgery date at this time. Also reports she is waiting on surgery date her husband was diagnosed with Cancer of the Ureter, emotional support given.S he rates her pain 3. Her current exercise regime is walking.  Jenna [name] Morphine equivalent is  13.97 MME.  She  is also prescribed Diazepam by Dr.Roberts, she uses it sporadically her  last prescription was picked up on 11/09/2017, according to PMP- Aware Website. We have reviewed the black box warning  regarding using opioids and benzodiazepines.I highlighted the dangers of using these drugs together and discussed the adverse events including respiratory suppression, overdose, cognitive impairment and importance of  compliance with current regimen. She verbalizes understanding, we will continue to monitor and adjust as indicated.    Last UDS was on 12/02/2017, it was inconsistent.    Pain Inventory Average Pain 4 Pain Right Now 3 My pain is intermittent, sharp and tingling  In the last 24 hours, has pain interfered with the following? General activity 4 Relation with others 2 Enjoyment of life 4 What TIME of day is your pain at its worst? evening and night Sleep (in general) Fair  Pain is worse with: standing and some activites Pain improves with: heat/ice, pacing activities and medication Relief from Meds: 6  Mobility walk without assistance how many minutes can you walk? 10 ability to climb steps?  yes do you drive?  yes Do you have any goals in this area?   yes  Function retired Do you have any goals in this area?  no  Neuro/Psych weakness spasms  Prior Studies Any changes since last visit?  yes CT/MRI  Physicians involved in your care Any changes since last visit?  yes Neurosurgeon Dr. Ellene Route    Family History  Problem Relation Age of Onset  . Stroke Mother   . Heart attack Father   . Diabetes Brother   . Heart disease Brother    Social History   Socioeconomic History  . Marital status: Married    Spouse name: Not on file  . Number of children: Not on file  . Years of education: Not on file  . Highest education level: Not on file  Social Needs  . Financial resource strain: Not on file  . Food insecurity - worry: Not on file  . Food insecurity - inability: Not on file  . Transportation needs - medical: Not on file  . Transportation needs - non-medical: Not on file  Occupational History  . Not on file  Tobacco Use  . Smoking status: Former Smoker    Packs/day: 1.00    Years: 15.00    Pack years: 15.00  . Smokeless tobacco: Never Used  . Tobacco comment: quit smoking in 1985  Substance and Sexual Activity  . Alcohol use: No    Alcohol/week: 3.6 oz    Types: 6 Standard drinks or equivalent per week    Frequency: Never  . Drug use: No  . Sexual activity: No    Partners: Male  Birth control/protection: Surgical    Comment: hysterectomy  Other Topics Concern  . Not on file  Social History Narrative  . Not on file   Past Surgical History:  Procedure Laterality Date  . ABDOMINAL HYSTERECTOMY  1982   BSO  . ANTERIOR CERVICAL DECOMP/DISCECTOMY FUSION N/A 10/03/2014   Procedure: ANTERIOR CERVICAL DECOMPRESSION/DISCECTOMY FUSION 1 LEVEL,CERVICAL THREE-FOUR;  Surgeon: [name], MD;  Location: [place] [facility];  Service: Neurosurgery;  Laterality: N/A;  . APPENDECTOMY  1986   along with chole  . BREAST BIOPSY Right 1997   sclerosing adenosis -Dr. Lucia Gaskins  . CHOLECYSTECTOMY    . COLONOSCOPY    .  ESOPHAGOGASTRODUODENOSCOPY     with dilitation  . LIPOMA EXCISION Left 10/1999   left thigh  . lumbosacral cyst  2005   seen on injection for low back pain   . LUNG BIOPSY  05/2001   /w Dr.Burney- bronchoscopy, mediastinoscopy  . REVERSE SHOULDER ARTHROPLASTY Left 02/01/2016   Procedure: LEFT REVERSE TOTAL SHOULDER ARTHROPLASTY;  Surgeon: [name], MD;  Location: [facility];  Service: Orthopedics;  Laterality: Left;  . rotator cuff  Bilateral 1996/1998   repair  . ROTATOR CUFF REPAIR Left 1996; 1981  . ROTATOR CUFF REPAIR Right 1996  . TONSILLECTOMY    . TOTAL HIP ARTHROPLASTY Left 10/2000  . TOTAL HIP ARTHROPLASTY Right 06/2005  . TOTAL HIP ARTHROPLASTY Left    10/2000  . VAGINAL DELIVERY     x1   Past Medical History:  Diagnosis Date  . Asthmatic bronchitis   . Cataracts, bilateral    immature  . Chronic back pain    arthritis.Stenosis  . Dysphagia   . Dysrhythmia    (?or extra beats) treated /w atenolol  . Emphysema lung ([place])    left  . GERD (gastroesophageal reflux disease)    takes Omeprazole daily  . Hard of hearing    wears hearing aides   . History of blood transfusion    no abnormal reaction noted  . History of gastric ulcer   . Hypercholesteremia    takes Atorvastatin daily  . Hypertension    takes Coreg and Losartan daily  . Hypothyroidism    takes Synthroid daily  . IBS (irritable bowel syndrome)    takes Librax daily  . Joint pain   . Lumbosacral spondylosis without myelopathy   . Melanoma ([place])    on left leg, wide excision   . Muscle spasm    takes Robaxin daily as needed  . Neuromuscular disorder ([place])    esophageal spasms at times   . Osteoarthritis   . Peripheral edema    take HCTZ daily  . Pneumonia    hx of.Many yrs ago  . 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 Seasonal allergies    takes Allegra daily and uses Flonase daily as needed  . Shortness of breath dyspnea    with exertion  .  Venous stasis   . Yeast infection    given Diflucan today   LMP 11/03/1980 (Approximate)   Opioid Risk Score:  0 Fall Risk Score:  `1  Depression screen PHQ 2/9  Depression screen [facility] 2/9 11/23/2017 07/21/2017 06/30/2017 02/19/2017 09/30/2016 12/13/2015 01/11/2015  Decreased Interest 0 0 0 0 0 0 0  Down, Depressed, Hopeless 0 0 0 0 0 0 0  PHQ - 2 Score 0 0 0 0 0 0 0  Altered sleeping - - - - - -  0  Tired, decreased energy - - - - - - 1  Change in appetite - - - - - - 0  Feeling bad or failure about yourself  - - - - - - 0  Trouble concentrating - - - - - - 1  Moving slowly or fidgety/restless - - - - - - 0  Suicidal thoughts - - - - - - 0  PHQ-9 Score - - - - - - 2    Review of Systems  Constitutional: Negative.   HENT: Negative.   Eyes: Negative.   Respiratory: Negative.   Cardiovascular: Negative.   Gastrointestinal: Negative.   Endocrine: Negative.   Genitourinary: Negative.   Musculoskeletal: Positive for back pain, neck pain and neck stiffness.  Skin: Negative.   Allergic/Immunologic: Negative.   Neurological: Negative.   Hematological: Negative.   Psychiatric/Behavioral: Negative.   All other systems reviewed and are negative.      Objective:   Physical Exam  Constitutional: She is oriented to person, place, and time. She appears well-developed and well-nourished.  HENT:  Head: Normocephalic and atraumatic.  Neck: Normal range of motion. Neck supple.  Cervical Paraspinal Tenderness: C-5-C-6  Cardiovascular: Normal rate and regular rhythm.  Pulmonary/Chest: Effort normal and breath sounds normal.  Musculoskeletal:  Normal Muscle Bulk and Muscle Testing Reveals: Upper Extremities: Right: Decreased ROM 45 Degrees and Muscle Strength 4/5 Left: ROM and Muscle Strength 5/5 Bilateral AC Joint Tenderness Lumbar Paraspinal Tenderness: L-3-L-5 Lower Extremities: Full ROM and Muscle Strength 5/5 Arises from Table with ease Narrow Based Gait  Neurological: She is  alert and oriented to person, place, and time.  Skin: Skin is warm and dry.  Psychiatric: She has a normal mood and affect.  Nursing note and vitals reviewed.         Assessment & Plan:  1.Chronic low back pain with lumbar spondylosis. 01/06/2018 Refilled:Decreased  Meperidine 50 mg one tablet three times a day as needed  #75.  We will continue the opioid monitoring program,this consists of regular clinic visits, examinations, urine drug screen, pill counts as well as use of [place] Controlled Substance reporting System. 2. Lumbar Radiculitis: S/P Lumbar Radiofrequency on 03/17/2017 with relief noted. Continue to monitor. 01/06/2018 3. Cervicalgia/ Cervical Spondylosis/Cervical stenosis with radiculitis/ S/P Anterior Cervical Fusion C3-C4 with Dr. Ellene Route on 10/03/2014. Continue to Monitor. Dr. Ellene Route Following. 01/06/2018 4. Chronic bilateral shoulder pain L>R: Right Shoulder Pain: No complaints Today. Continue with Current medication regime.01/06/2018 S/P Left Reverse Total Shoulder Arthroplasty on 02/01/16 via Dr. Veverly Fells 5. Muscle Spasm: Continue  Methocarbamol. 01/06/2018.  20  minutes of face to face patient care time was spent during this visit. All questions were encouraged and answered.  F/U in 1 month

## 2018-02-08 ENCOUNTER — Ambulatory Visit: Payer: Medicare Other | Admitting: Registered Nurse

## 2018-02-08 ENCOUNTER — Encounter: Payer: Medicare Other | Admitting: Registered Nurse

## 2018-02-09 ENCOUNTER — Ambulatory Visit: Payer: Medicare Other | Admitting: Registered Nurse

## 2018-02-10 ENCOUNTER — Encounter: Payer: Self-pay | Admitting: Registered Nurse

## 2018-02-10 ENCOUNTER — Encounter: Payer: Medicare Other | Attending: Physical Medicine and Rehabilitation | Admitting: Registered Nurse

## 2018-02-10 VITALS — BP 157/71 | HR 71 | Resp 14 | Ht 62.0 in | Wt 140.0 lb

## 2018-02-10 DIAGNOSIS — M12811 Other specific arthropathies, not elsewhere classified, right shoulder: Secondary | ICD-10-CM | POA: Diagnosis present

## 2018-02-10 DIAGNOSIS — M12812 Other specific arthropathies, not elsewhere classified, left shoulder: Secondary | ICD-10-CM | POA: Insufficient documentation

## 2018-02-10 DIAGNOSIS — Z981 Arthrodesis status: Secondary | ICD-10-CM

## 2018-02-10 DIAGNOSIS — M4712 Other spondylosis with myelopathy, cervical region: Secondary | ICD-10-CM

## 2018-02-10 DIAGNOSIS — Z79899 Other long term (current) drug therapy: Secondary | ICD-10-CM | POA: Insufficient documentation

## 2018-02-10 DIAGNOSIS — Z5181 Encounter for therapeutic drug level monitoring: Secondary | ICD-10-CM | POA: Insufficient documentation

## 2018-02-10 DIAGNOSIS — M47817 Spondylosis without myelopathy or radiculopathy, lumbosacral region: Secondary | ICD-10-CM | POA: Diagnosis not present

## 2018-02-10 DIAGNOSIS — M47816 Spondylosis without myelopathy or radiculopathy, lumbar region: Secondary | ICD-10-CM | POA: Diagnosis present

## 2018-02-10 DIAGNOSIS — M542 Cervicalgia: Secondary | ICD-10-CM

## 2018-02-10 DIAGNOSIS — M5412 Radiculopathy, cervical region: Secondary | ICD-10-CM

## 2018-02-10 DIAGNOSIS — G894 Chronic pain syndrome: Secondary | ICD-10-CM | POA: Diagnosis not present

## 2018-02-10 DIAGNOSIS — M47812 Spondylosis without myelopathy or radiculopathy, cervical region: Secondary | ICD-10-CM | POA: Diagnosis present

## 2018-02-10 MED ORDER — MEPERIDINE HCL 50 MG PO TABS: 50.0000 mg | ORAL_TABLET | Freq: Three times a day (TID) | ORAL | 0 refills | Status: DC

## 2018-02-10 NOTE — Progress Notes (Signed)
Subjective:    Patient ID: Jenna [name], female    DOB: 04-23-1933, 84 y.o.   MRN: [id number]  HPI: [name] is a 84 year old female who returns for follow up appointment for chronic pain and medication refill. She states her pain is located in her neck radiating into her right shoulder and right arm with tingling and numbness. She also states she placed a Cervical Fusion ( 3-level anterior decompression and arthrodesis per [facility] on hold. Her husband has been diagnosed with Lymphoma receiving chemotherapy, emotional support given. She rates her pain 4. Her current exercise regime is walking.   Jenna [name] Morphine equivalent is 12.50 MME. She is also prescribed Diazepam by Dr. Mancel Bale, she's using it sparingly, last prescription picked up on 11/09/2017. We have We have discussed the black box warning of using opioids and benzodiazepines.  highlighted the dangers of using these drugs together and discussed the adverse events including respiratory suppression, overdose, cognitive impairment and importance of  compliance with current regimen. She verbalizes understanding, we will continue to monitor and adjust as indicated.    Last UDS was Performed on 11/23/2017, it was consistent.    Pain Inventory Average Pain 5 Pain Right Now 4 My pain is constant, burning and aching  In the last 24 hours, has pain interfered with the following? General activity 5 Relation with others 4 Enjoyment of life 3 What TIME of day is your pain at its worst? evening, night  Sleep (in general) Fair  Pain is worse with: standing and some activites Pain improves with: rest, pacing activities and medication Relief from Meds: 5  Mobility walk without assistance how many minutes can you walk? 10-15 ability to climb steps?  yes do you drive?  yes Do you have any goals in this area?  yes  Function retired Do you have any goals in this area?   no  Neuro/Psych weakness tingling spasms  Prior Studies Any changes since last visit?  no  Physicians involved in your care Any changes since last visit?  no   Family History  Problem Relation Age of Onset  . Stroke Mother   . Heart attack Father   . Diabetes Brother   . Heart disease Brother    Social History   Socioeconomic History  . Marital status: Married    Spouse name: Not on file  . Number of children: Not on file  . Years of education: Not on file  . Highest education level: Not on file  Occupational History  . Not on file  Social Needs  . Financial resource strain: Not on file  . Food insecurity:    Worry: Not on file    Inability: Not on file  . Transportation needs:    Medical: Not on file    Non-medical: Not on file  Tobacco Use  . Smoking status: Former Smoker    Packs/day: 1.00    Years: 15.00    Pack years: 15.00  . Smokeless tobacco: Never Used  . Tobacco comment: quit smoking in 1985  Substance and Sexual Activity  . Alcohol use: No    Alcohol/week: 3.6 oz    Types: 6 Standard drinks or equivalent per week    Frequency: Never  . Drug use: No  . Sexual activity: Never    Partners: Male    Birth control/protection: Surgical    Comment: hysterectomy  Lifestyle  . Physical activity:    Days per week: Not  on file    Minutes per session: Not on file  . Stress: Not on file  Relationships  . Social connections:    Talks on phone: Not on file    Gets together: Not on file    Attends religious service: Not on file    Active member of club or organization: Not on file    Attends meetings of clubs or organizations: Not on file    Relationship status: Not on file  Other Topics Concern  . Not on file  Social History Narrative  . Not on file   Past Surgical History:  Procedure Laterality Date  . ABDOMINAL HYSTERECTOMY  1982   BSO  . ANTERIOR CERVICAL DECOMP/DISCECTOMY FUSION N/A 10/03/2014   Procedure: ANTERIOR CERVICAL  DECOMPRESSION/DISCECTOMY FUSION 1 LEVEL,CERVICAL THREE-FOUR;  Surgeon: [name], MD;  Location: [place] [facility];  Service: Neurosurgery;  Laterality: N/A;  . APPENDECTOMY  1986   along with chole  . BREAST BIOPSY Right 1997   sclerosing adenosis -Dr. Lucia Gaskins  . CHOLECYSTECTOMY    . COLONOSCOPY    . ESOPHAGOGASTRODUODENOSCOPY     with dilitation  . LIPOMA EXCISION Left 10/1999   left thigh  . lumbosacral cyst  2005   seen on injection for low back pain   . LUNG BIOPSY  05/2001   /w Dr.Burney- bronchoscopy, mediastinoscopy  . REVERSE SHOULDER ARTHROPLASTY Left 02/01/2016   Procedure: LEFT REVERSE TOTAL SHOULDER ARTHROPLASTY;  Surgeon: [name], MD;  Location: [facility];  Service: Orthopedics;  Laterality: Left;  . rotator cuff  Bilateral 1996/1998   repair  . ROTATOR CUFF REPAIR Left 1996; 1981  . ROTATOR CUFF REPAIR Right 1996  . TONSILLECTOMY    . TOTAL HIP ARTHROPLASTY Left 10/2000  . TOTAL HIP ARTHROPLASTY Right 06/2005  . TOTAL HIP ARTHROPLASTY Left    10/2000  . VAGINAL DELIVERY     x1   Past Medical History:  Diagnosis Date  . Asthmatic bronchitis   . Cataracts, bilateral    immature  . Chronic back pain    arthritis.Stenosis  . Dysphagia   . Dysrhythmia    (?or extra beats) treated /w atenolol  . Emphysema lung ([place])    left  . GERD (gastroesophageal reflux disease)    takes Omeprazole daily  . Hard of hearing    wears hearing aides   . History of blood transfusion    no abnormal reaction noted  . History of gastric ulcer   . Hypercholesteremia    takes Atorvastatin daily  . Hypertension    takes Coreg and Losartan daily  . Hypothyroidism    takes Synthroid daily  . IBS (irritable bowel syndrome)    takes Librax daily  . Joint pain   . Lumbosacral spondylosis without myelopathy   . Melanoma ([place])    on left leg, wide excision   . Muscle spasm    takes Robaxin daily as needed  . Neuromuscular disorder ([place])    esophageal spasms at times   .  Osteoarthritis   . Peripheral edema    take HCTZ daily  . Pneumonia    hx of.Many yrs ago  . 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 Seasonal allergies    takes Allegra daily and uses Flonase daily as needed  . Shortness of breath dyspnea    with exertion  . Venous stasis   . Yeast infection    given Diflucan today   BP [name])  157/71 (BP Location: Left Arm, Patient Position: Sitting, Cuff Size: Normal)   Pulse 71   Resp 14   Ht 5\' 2"  (1.575 m)   Wt 140 lb (63.5 kg)   LMP 11/03/1980 (Approximate)   SpO2 99%   BMI 25.61 kg/m   Opioid Risk Score:   Fall Risk Score:  `1  Depression screen PHQ 2/9  Depression screen [facility] 2/9 11/23/2017 07/21/2017 06/30/2017 02/19/2017 09/30/2016 12/13/2015 01/11/2015  Decreased Interest 0 0 0 0 0 0 0  Down, Depressed, Hopeless 0 0 0 0 0 0 0  PHQ - 2 Score 0 0 0 0 0 0 0  Altered sleeping - - - - - - 0  Tired, decreased energy - - - - - - 1  Change in appetite - - - - - - 0  Feeling bad or failure about yourself  - - - - - - 0  Trouble concentrating - - - - - - 1  Moving slowly or fidgety/restless - - - - - - 0  Suicidal thoughts - - - - - - 0  PHQ-9 Score - - - - - - 2    Review of Systems  Constitutional: Negative.   HENT: Negative.   Eyes: Negative.   Respiratory: Negative.   Cardiovascular: Negative.   Gastrointestinal: Negative.   Endocrine: Negative.   Genitourinary: Negative.   Musculoskeletal: Positive for back pain and neck pain.       Spasms  Skin: Negative.   Neurological: Positive for weakness.       Tingling  Hematological: Negative.   Psychiatric/Behavioral: Negative.   All other systems reviewed and are negative.      Objective:   Physical Exam  Constitutional: She is oriented to person, place, and time. She appears well-developed and well-nourished.  HENT:  Head: Normocephalic and atraumatic.  Neck: Normal range of motion. Neck supple.  Cervical Paraspinal Tenderness:  C-5-C-6  Cardiovascular: Normal rate and regular rhythm.  Pulmonary/Chest: Effort normal and breath sounds normal.  Musculoskeletal:  Normal Muscle Bulk and Muscle Testing Reveals: Upper Extremities: Right: Decreased ROM 45 Degrees and Muscle Strength 4/5 Left: Decreased ROM 90 Degrees and Muscle Strength 4/5 Right AC Joint Tenderness Back without spinal tenderness noted Lower Extremities: Full ROM and Muscle Strength 5/5 Arises from Table with ease Narrow Based Gait  Neurological: She is alert and oriented to person, place, and time.  Skin: Skin is warm and dry.  Psychiatric: She has a normal mood and affect.  Nursing note and vitals reviewed.         Assessment & Plan:  1.Chronic low back pain with lumbar spondylosis. 02/10/2018 Continue current Treatment Regimen: Refilled: Meperidine 50 mg one tablet three times a day as needed  #75.  We will continue the opioid monitoring program,this consists of regular clinic visits, examinations, urine drug screen, pill counts as well as use of [place] Controlled Substance reporting System. 2. Lumbar Radiculitis: S/PLumbar Radiofrequency on 03/17/2017 with relief noted. Adverse Reaction with Gabapentin and Amitriptyline we will Continue to monitor. 02/10/2018 3. Cervicalgia/ Cervical Spondylosis/Cervical stenosis with radiculitis/ S/P Anterior Cervical Fusion C3-C4 with Dr. Ellene Route on 10/03/2014.  3-level anterior decompression and arthrodesis per [facility] on hold. Continue to Monitor. Dr. Ellene Route Following. 02/10/2018 4. Chronic bilateral shoulder pain: Right Shoulder Pain: Continue with Current medication regime.02/10/2018 S/P Left Reverse Total Shoulder Arthroplasty on 02/01/16 via Dr. Veverly Fells 5. Muscle Spasm: Continue current treatment with   Methocarbamol. 02/10/2018.  20  minutes  of face to face patient care time was spent during this visit. All questions were encouraged and answered.  F/U in 1  month

## 2018-02-16 ENCOUNTER — Other Ambulatory Visit: Payer: Self-pay | Admitting: Registered Nurse

## 2018-03-09 ENCOUNTER — Ambulatory Visit: Payer: Medicare Other | Admitting: Registered Nurse

## 2018-03-10 ENCOUNTER — Encounter: Payer: Medicare Other | Admitting: Registered Nurse

## 2018-03-10 ENCOUNTER — Other Ambulatory Visit: Payer: Self-pay

## 2018-03-10 ENCOUNTER — Encounter: Payer: Medicare Other | Attending: Physical Medicine and Rehabilitation | Admitting: Registered Nurse

## 2018-03-10 ENCOUNTER — Encounter: Payer: Self-pay | Admitting: Registered Nurse

## 2018-03-10 VITALS — BP 122/74 | HR 69 | Ht 62.0 in | Wt 141.2 lb

## 2018-03-10 DIAGNOSIS — M5412 Radiculopathy, cervical region: Secondary | ICD-10-CM

## 2018-03-10 DIAGNOSIS — M47816 Spondylosis without myelopathy or radiculopathy, lumbar region: Secondary | ICD-10-CM | POA: Diagnosis not present

## 2018-03-10 DIAGNOSIS — M47817 Spondylosis without myelopathy or radiculopathy, lumbosacral region: Secondary | ICD-10-CM

## 2018-03-10 DIAGNOSIS — M12811 Other specific arthropathies, not elsewhere classified, right shoulder: Secondary | ICD-10-CM | POA: Insufficient documentation

## 2018-03-10 DIAGNOSIS — M47812 Spondylosis without myelopathy or radiculopathy, cervical region: Secondary | ICD-10-CM | POA: Insufficient documentation

## 2018-03-10 DIAGNOSIS — Z981 Arthrodesis status: Secondary | ICD-10-CM | POA: Diagnosis not present

## 2018-03-10 DIAGNOSIS — M4712 Other spondylosis with myelopathy, cervical region: Secondary | ICD-10-CM

## 2018-03-10 DIAGNOSIS — Z79899 Other long term (current) drug therapy: Secondary | ICD-10-CM | POA: Diagnosis present

## 2018-03-10 DIAGNOSIS — M12812 Other specific arthropathies, not elsewhere classified, left shoulder: Secondary | ICD-10-CM | POA: Insufficient documentation

## 2018-03-10 DIAGNOSIS — G894 Chronic pain syndrome: Secondary | ICD-10-CM | POA: Diagnosis not present

## 2018-03-10 DIAGNOSIS — M62838 Other muscle spasm: Secondary | ICD-10-CM

## 2018-03-10 DIAGNOSIS — M542 Cervicalgia: Secondary | ICD-10-CM | POA: Diagnosis not present

## 2018-03-10 DIAGNOSIS — Z5181 Encounter for therapeutic drug level monitoring: Secondary | ICD-10-CM

## 2018-03-10 MED ORDER — MEPERIDINE HCL 50 MG PO TABS: 50.0000 mg | ORAL_TABLET | Freq: Three times a day (TID) | ORAL | 0 refills | Status: DC

## 2018-03-10 NOTE — Progress Notes (Signed)
Subjective:    Patient ID: Jenna [name], female    DOB: [birth year], 84 y.o.   MRN: [id number]  HPI: Jenna [name] is a 84 year old female who returns for follow up appointment for chronic pain and medication refill. She states her pain is located in her neck radiating into her right shoulder with tingling and lower back pain. She rates her pain 5. Her current exercise regime is walking.   Jenna [name] Morphine equivalent is 14.50 MME. She is also prescribed Diazepam by Dr. Mancel Bale, last prescription was filled on 11/09/2017. We have discussed the black box warning of using opioids and benzodiazepines. I highlighted the dangers of using these drugs together and discussed the adverse events including respiratory suppression, overdose, cognitive impairment and importance of compliance with current regimen. We will continue to monitor and adjust as indicated.   Last UDS was Performed on 11/23/2017, it was consistent.   Pain Inventory Average Pain 4 Pain Right Now 5 My pain is intermittent, burning and aching  In the last 24 hours, has pain interfered with the following? General activity 5 Relation with others 3 Enjoyment of life 3 What TIME of day is your pain at its worst? daytime evening Sleep (in general) Fair  Pain is worse with: standing and some activites Pain improves with: rest, pacing activities and medication Relief from Meds: 7  Mobility walk without assistance ability to climb steps?  yes do you drive?  yes  Function retired  Neuro/Psych weakness tingling spasms  Prior Studies Any changes since last visit?  no  Physicians involved in your care Any changes since last visit?  no   Family History  Problem Relation Age of Onset  . Stroke Mother   . Heart attack Father   . Diabetes Brother   . Heart disease Brother    Social History   Socioeconomic History  . Marital status: Married    Spouse name: Not on file  . Number of children: Not on file  .  Years of education: Not on file  . Highest education level: Not on file  Occupational History  . Not on file  Social Needs  . Financial resource strain: Not on file  . Food insecurity:    Worry: Not on file    Inability: Not on file  . Transportation needs:    Medical: Not on file    Non-medical: Not on file  Tobacco Use  . Smoking status: Former Smoker    Packs/day: 1.00    Years: 15.00    Pack years: 15.00  . Smokeless tobacco: Never Used  . Tobacco comment: quit smoking in 1985  Substance and Sexual Activity  . Alcohol use: No    Alcohol/week: 3.6 oz    Types: 6 Standard drinks or equivalent per week    Frequency: Never  . Drug use: No  . Sexual activity: Never    Partners: Male    Birth control/protection: Surgical    Comment: hysterectomy  Lifestyle  . Physical activity:    Days per week: Not on file    Minutes per session: Not on file  . Stress: Not on file  Relationships  . Social connections:    Talks on phone: Not on file    Gets together: Not on file    Attends religious service: Not on file    Active member of club or organization: Not on file    Attends meetings of clubs or organizations: Not on file  Relationship status: Not on file  Other Topics Concern  . Not on file  Social History Narrative  . Not on file   Past Surgical History:  Procedure Laterality Date  . ABDOMINAL HYSTERECTOMY  1982   BSO  . ANTERIOR CERVICAL DECOMP/DISCECTOMY FUSION N/A 10/03/2014   Procedure: ANTERIOR CERVICAL DECOMPRESSION/DISCECTOMY FUSION 1 LEVEL,CERVICAL THREE-FOUR;  Surgeon: [name], MD;  Location: [place] [facility];  Service: Neurosurgery;  Laterality: N/A;  . APPENDECTOMY  1986   along with chole  . BREAST BIOPSY Right 1997   sclerosing adenosis -Dr. Lucia Gaskins  . CHOLECYSTECTOMY    . COLONOSCOPY    . ESOPHAGOGASTRODUODENOSCOPY     with dilitation  . LIPOMA EXCISION Left 10/1999   left thigh  . lumbosacral cyst  2005   seen on injection for low back pain   .  LUNG BIOPSY  05/2001   /w Dr.Burney- bronchoscopy, mediastinoscopy  . REVERSE SHOULDER ARTHROPLASTY Left 02/01/2016   Procedure: LEFT REVERSE TOTAL SHOULDER ARTHROPLASTY;  Surgeon: [name], MD;  Location: [facility];  Service: Orthopedics;  Laterality: Left;  . rotator cuff  Bilateral 1996/1998   repair  . ROTATOR CUFF REPAIR Left 1996; 1981  . ROTATOR CUFF REPAIR Right 1996  . TONSILLECTOMY    . TOTAL HIP ARTHROPLASTY Left 10/2000  . TOTAL HIP ARTHROPLASTY Right 06/2005  . TOTAL HIP ARTHROPLASTY Left    10/2000  . VAGINAL DELIVERY     x1   Past Medical History:  Diagnosis Date  . Asthmatic bronchitis   . Cataracts, bilateral    immature  . Chronic back pain    arthritis.Stenosis  . Dysphagia   . Dysrhythmia    (?or extra beats) treated /w atenolol  . Emphysema lung ([place])    left  . GERD (gastroesophageal reflux disease)    takes Omeprazole daily  . Hard of hearing    wears hearing aides   . History of blood transfusion    no abnormal reaction noted  . History of gastric ulcer   . Hypercholesteremia    takes Atorvastatin daily  . Hypertension    takes Coreg and Losartan daily  . Hypothyroidism    takes Synthroid daily  . IBS (irritable bowel syndrome)    takes Librax daily  . Joint pain   . Lumbosacral spondylosis without myelopathy   . Melanoma ([place])    on left leg, wide excision   . Muscle spasm    takes Robaxin daily as needed  . Neuromuscular disorder ([place])    esophageal spasms at times   . Osteoarthritis   . Peripheral edema    take HCTZ daily  . Pneumonia    hx of.Many yrs ago  . 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 Seasonal allergies    takes Allegra daily and uses Flonase daily as needed  . Shortness of breath dyspnea    with exertion  . Venous stasis   . Yeast infection    given Diflucan today   BP 122/74   Pulse 69   Ht 5\' 2"  (1.575 m) Comment: pt reported  Wt 141 lb 3.2 oz (64 kg)   LMP  11/03/1980 (Approximate)   SpO2 98%   BMI 25.83 kg/m   Opioid Risk Score:   Fall Risk Score:  `1  Depression screen PHQ 2/9  Depression screen [facility] 2/9 03/10/2018 11/23/2017 07/21/2017 06/30/2017 02/19/2017 09/30/2016 12/13/2015  Decreased Interest 0 0 0 0 0 0 0  Down, Depressed, Hopeless 0 0 0 0 0 0 0  PHQ - 2 Score 0 0 0 0 0 0 0  Altered sleeping - - - - - - -  Tired, decreased energy - - - - - - -  Change in appetite - - - - - - -  Feeling bad or failure about yourself  - - - - - - -  Trouble concentrating - - - - - - -  Moving slowly or fidgety/restless - - - - - - -  Suicidal thoughts - - - - - - -  PHQ-9 Score - - - - - - -  Some recent data might be hidden   Review of Systems  Constitutional: Negative.   HENT: Negative.   Eyes: Negative.   Respiratory: Negative.   Cardiovascular: Negative.   Gastrointestinal: Negative.   Endocrine: Negative.   Genitourinary: Negative.   Musculoskeletal: Negative.   Skin: Negative.   Allergic/Immunologic: Negative.   Neurological: Negative.   Hematological: Negative.   Psychiatric/Behavioral: Negative.   All other systems reviewed and are negative.      Objective:   Physical Exam  Constitutional: She is oriented to person, place, and time. She appears well-developed and well-nourished.  HENT:  Head: Normocephalic and atraumatic.  Neck: Normal range of motion. Neck supple.  Cardiovascular: Normal rate and regular rhythm.  Pulmonary/Chest: Effort normal and breath sounds normal.  Musculoskeletal:  Normal Muscle Bulk and Muscle Testing Reveals: Upper Extremities: Full ROM and Muscle Strength 5/5 Lumbar Paraspinal Tenderness: L-4-L-5 Lower Extremities: Full ROM and Muscle Strength 5/5 Arises from Table with Ease Narrow Based Gait  Neurological: She is alert and oriented to person, place, and time.  Skin: Skin is warm and dry.  Psychiatric: She has a normal mood and affect.  Nursing note and vitals reviewed.           Assessment & Plan:  1.Chronic low back pain with lumbar spondylosis: Continue current medication regimen. 03/10/2018. Refilled:Meperidine 50 mg one tablet three times a day as needed#75.  We will continue the opioid monitoring program,this consists of regular clinic visits, examinations, urine drug screen, pill counts as well as use of [place] Controlled Substance reporting System. 2. Lumbar Radiculitis: S/PLumbar Radiofrequency on 03/17/2017 with relief noted. Adverse Reaction with Gabapentin and Amitriptyline we will Continue to monitor.03/10/2018 3. Cervicalgia/Cervical Spondylosis/Cervical stenosis with radiculitis/ S/P Anterior Cervical Fusion C3-C4 with Dr. Ellene Route on 10/03/2014.  3-level anterior decompression and arthrodesis per [facility] on hold. Continue to Monitor.Dr. Ellene Route Following. 03/10/2018 4. Chronic bilateral shoulder pain: Right Shoulder Pain: Continue with Current medication regime.03/10/2018 S/P Left Reverse Total Shoulder Arthroplasty on 02/01/16 via Dr. Veverly Fells. 5. Muscle Spasm:Continue current treatment with Methocarbamol. 03/10/2018.  20 minutes of face to face patient care time was spent during this visit. All questions were encouraged and answered.  F/U in 1 month

## 2018-03-22 ENCOUNTER — Telehealth: Payer: Self-pay | Admitting: Registered Nurse

## 2018-03-22 MED ORDER — MEPERIDINE HCL 50 MG PO TABS
50.0000 mg | ORAL_TABLET | Freq: Three times a day (TID) | ORAL | 0 refills | Status: DC
Start: 2018-03-22 — End: 2018-04-08

## 2018-03-22 NOTE — Telephone Encounter (Signed)
Received a call from [name]. When she picked up her Meperidine on 03/19/2018, they only had 40 tablets. Since it was the weekend she allowed them to fill the prescription. [name] called [facility] on [address] the have Meperidine, will e-scribe for the remaining 35 tablets. She verbalizes understanding.

## 2018-04-08 ENCOUNTER — Other Ambulatory Visit: Payer: Self-pay

## 2018-04-08 ENCOUNTER — Encounter: Payer: Medicare Other | Attending: Physical Medicine and Rehabilitation | Admitting: Registered Nurse

## 2018-04-08 ENCOUNTER — Encounter: Payer: Self-pay | Admitting: Registered Nurse

## 2018-04-08 VITALS — BP 122/72 | HR 91 | Ht 62.0 in | Wt 143.0 lb

## 2018-04-08 DIAGNOSIS — M5412 Radiculopathy, cervical region: Secondary | ICD-10-CM

## 2018-04-08 DIAGNOSIS — M62838 Other muscle spasm: Secondary | ICD-10-CM | POA: Diagnosis not present

## 2018-04-08 DIAGNOSIS — G894 Chronic pain syndrome: Secondary | ICD-10-CM

## 2018-04-08 DIAGNOSIS — M542 Cervicalgia: Secondary | ICD-10-CM | POA: Diagnosis not present

## 2018-04-08 DIAGNOSIS — Z981 Arthrodesis status: Secondary | ICD-10-CM

## 2018-04-08 DIAGNOSIS — M47817 Spondylosis without myelopathy or radiculopathy, lumbosacral region: Secondary | ICD-10-CM | POA: Diagnosis not present

## 2018-04-08 DIAGNOSIS — M4712 Other spondylosis with myelopathy, cervical region: Secondary | ICD-10-CM | POA: Diagnosis not present

## 2018-04-08 DIAGNOSIS — M47812 Spondylosis without myelopathy or radiculopathy, cervical region: Secondary | ICD-10-CM | POA: Diagnosis present

## 2018-04-08 DIAGNOSIS — M12812 Other specific arthropathies, not elsewhere classified, left shoulder: Secondary | ICD-10-CM | POA: Insufficient documentation

## 2018-04-08 DIAGNOSIS — Z79899 Other long term (current) drug therapy: Secondary | ICD-10-CM | POA: Insufficient documentation

## 2018-04-08 DIAGNOSIS — Z5181 Encounter for therapeutic drug level monitoring: Secondary | ICD-10-CM | POA: Insufficient documentation

## 2018-04-08 DIAGNOSIS — M12811 Other specific arthropathies, not elsewhere classified, right shoulder: Secondary | ICD-10-CM | POA: Diagnosis present

## 2018-04-08 DIAGNOSIS — M47816 Spondylosis without myelopathy or radiculopathy, lumbar region: Secondary | ICD-10-CM | POA: Diagnosis present

## 2018-04-08 MED ORDER — MEPERIDINE HCL 50 MG PO TABS: 50.0000 mg | ORAL_TABLET | Freq: Three times a day (TID) | ORAL | 0 refills | Status: DC

## 2018-04-08 MED ORDER — PROMETHAZINE HCL 25 MG PO TABS: 25.0000 mg | ORAL_TABLET | Freq: Two times a day (BID) | ORAL | 6 refills | Status: DC | PRN

## 2018-04-08 NOTE — Progress Notes (Signed)
Subjective:    Patient ID: Jenna [name], female    DOB: 1932-12-19, 84 y.o.   MRN: [id number]  HPI: Jenna [name] is a 84 year old female who returns for follow up appointment for chronic pain and medication refill. She states her pain is located in her neck radiating into her bilateral shoulders, lower back pain and occasionally right hip pain. She rates her pain 3. Her current exercise regime is walking and performing stretching exercises.  Jenna [name] Morphine Equivalent is 15.00 MME.   Last UDS was Performed on 11/23/2017, it was consistent.   Pain Inventory Average Pain 5 Pain Right Now 3 My pain is intermittent, stabbing and aching  In the last 24 hours, has pain interfered with the following? General activity 3 Relation with others 3 Enjoyment of life 3 What TIME of day is your pain at its worst? daytime evening Sleep (in general) Good  Pain is worse with: standing and some activites Pain improves with: rest, pacing activities and medication Relief from Meds: 7  Mobility walk without assistance how many minutes can you walk? 10 ability to climb steps?  yes do you drive?  yes Do you have any goals in this area?  yes  Function retired  Neuro/Psych weakness spasms  Prior Studies Any changes since last visit?  no  Physicians involved in your care Any changes since last visit?  no   Family History  Problem Relation Age of Onset  . Stroke Mother   . Heart attack Father   . Diabetes Brother   . Heart disease Brother    Social History   Socioeconomic History  . Marital status: Married    Spouse name: Not on file  . Number of children: Not on file  . Years of education: Not on file  . Highest education level: Not on file  Occupational History  . Not on file  Social Needs  . Financial resource strain: Not on file  . Food insecurity:    Worry: Not on file    Inability: Not on file  . Transportation needs:    Medical: Not on file    Non-medical:  Not on file  Tobacco Use  . Smoking status: Former Smoker    Packs/day: 1.00    Years: 15.00    Pack years: 15.00  . Smokeless tobacco: Never Used  . Tobacco comment: quit smoking in 1985  Substance and Sexual Activity  . Alcohol use: No    Alcohol/week: 3.6 oz    Types: 6 Standard drinks or equivalent per week    Frequency: Never  . Drug use: No  . Sexual activity: Never    Partners: Male    Birth control/protection: Surgical    Comment: hysterectomy  Lifestyle  . Physical activity:    Days per week: Not on file    Minutes per session: Not on file  . Stress: Not on file  Relationships  . Social connections:    Talks on phone: Not on file    Gets together: Not on file    Attends religious service: Not on file    Active member of club or organization: Not on file    Attends meetings of clubs or organizations: Not on file    Relationship status: Not on file  Other Topics Concern  . Not on file  Social History Narrative  . Not on file   Past Surgical History:  Procedure Laterality Date  . ABDOMINAL HYSTERECTOMY  1982  BSO  . ANTERIOR CERVICAL DECOMP/DISCECTOMY FUSION N/A 10/03/2014   Procedure: ANTERIOR CERVICAL DECOMPRESSION/DISCECTOMY FUSION 1 LEVEL,CERVICAL THREE-FOUR;  Surgeon: [name], MD;  Location: [place] [facility];  Service: Neurosurgery;  Laterality: N/A;  . APPENDECTOMY  1986   along with chole  . BREAST BIOPSY Right 1997   sclerosing adenosis -Dr. Lucia Gaskins  . CHOLECYSTECTOMY    . COLONOSCOPY    . ESOPHAGOGASTRODUODENOSCOPY     with dilitation  . LIPOMA EXCISION Left 10/1999   left thigh  . lumbosacral cyst  2005   seen on injection for low back pain   . LUNG BIOPSY  05/2001   /w Dr.Burney- bronchoscopy, mediastinoscopy  . REVERSE SHOULDER ARTHROPLASTY Left 02/01/2016   Procedure: LEFT REVERSE TOTAL SHOULDER ARTHROPLASTY;  Surgeon: [name], MD;  Location: [place];  Service: Orthopedics;  Laterality: Left;  . rotator cuff  Bilateral 1996/1998   repair    . ROTATOR CUFF REPAIR Left 1996; 1981  . ROTATOR CUFF REPAIR Right 1996  . TONSILLECTOMY    . TOTAL HIP ARTHROPLASTY Left 10/2000  . TOTAL HIP ARTHROPLASTY Right 06/2005  . TOTAL HIP ARTHROPLASTY Left    10/2000  . VAGINAL DELIVERY     x1   Past Medical History:  Diagnosis Date  . Asthmatic bronchitis   . Cataracts, bilateral    immature  . Chronic back pain    arthritis.Stenosis  . Dysphagia   . Dysrhythmia    (?or extra beats) treated /w atenolol  . Emphysema lung ([place])    left  . GERD (gastroesophageal reflux disease)    takes Omeprazole daily  . Hard of hearing    wears hearing aides   . History of blood transfusion    no abnormal reaction noted  . History of gastric ulcer   . Hypercholesteremia    takes Atorvastatin daily  . Hypertension    takes Coreg and Losartan daily  . Hypothyroidism    takes Synthroid daily  . IBS (irritable bowel syndrome)    takes Librax daily  . Joint pain   . Lumbosacral spondylosis without myelopathy   . Melanoma ([place])    on left leg, wide excision   . Muscle spasm    takes Robaxin daily as needed  . Neuromuscular disorder ([place])    esophageal spasms at times   . Osteoarthritis   . Peripheral edema    take HCTZ daily  . Pneumonia    hx of.Many yrs ago  . 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 Seasonal allergies    takes Allegra daily and uses Flonase daily as needed  . Shortness of breath dyspnea    with exertion  . Venous stasis   . Yeast infection    given Diflucan today   BP 122/72   Pulse 91   Ht 5\' 2"  (1.575 m) Comment: pt reported  Wt 143 lb (64.9 kg)   LMP 11/03/1980 (Approximate)   SpO2 97%   BMI 26.16 kg/m   Opioid Risk Score:   Fall Risk Score:  `1  Depression screen PHQ 2/9  Depression screen [facility] 2/9 04/08/2018 03/10/2018 11/23/2017 07/21/2017 06/30/2017 02/19/2017 09/30/2016  Decreased Interest - 0 0 0 0 0 0  Down, Depressed, Hopeless - 0 0 0 0 0 0  PHQ - 2  Score - 0 0 0 0 0 0  Altered sleeping 0 - - - - - -  Tired, decreased energy - - - - - - -  Change in appetite - - - - - - -  Feeling bad or failure about yourself  - - - - - - -  Trouble concentrating - - - - - - -  Moving slowly or fidgety/restless - - - - - - -  Suicidal thoughts - - - - - - -  PHQ-9 Score - - - - - - -  Some recent data might be hidden   Review of Systems  Constitutional: Negative.   HENT: Negative.   Eyes: Negative.   Respiratory: Negative.   Cardiovascular: Negative.   Gastrointestinal: Negative.   Endocrine: Negative.   Genitourinary: Negative.   Musculoskeletal: Negative.   Skin: Negative.   Allergic/Immunologic: Negative.   Neurological: Negative.   Hematological: Negative.   Psychiatric/Behavioral: Negative.   All other systems reviewed and are negative.      Objective:   Physical Exam  Constitutional: She is oriented to person, place, and time. She appears well-developed and well-nourished.  HENT:  Head: Normocephalic and atraumatic.  Neck: Normal range of motion. Neck supple.  Cervical Paraspinal Tenderness: C-5-C-6  Cardiovascular: Normal rate and regular rhythm.  Pulmonary/Chest: Effort normal and breath sounds normal.  Musculoskeletal:  Normal Muscle Bulk and Muscle Testing Reveals: Upper Extremities: Full ROM and Muscle Strength 5/5 Thoracic Paraspinal Tenderness: T-1-T-3 Lumbar Paraspinal Tenderness: L-3- L-5 Lower Extremities: Full ROM and Muscle Strength 5/5 Arises from Table with ease Narrow Based Gait   Neurological: She is alert and oriented to person, place, and time.  Skin: Skin is warm and dry.  Psychiatric: She has a normal mood and affect.  Nursing note and vitals reviewed.         Assessment & Plan:  1.Chronic low back pain with lumbar spondylosis: Continue current medication regimen. 04/08/2018. Refilled:Meperidine 50 mg one tablet three times a day as needed#75.  We will continue the opioid monitoring  program,this consists of regular clinic visits, examinations, urine drug screen, pill counts as well as use of [place] Controlled Substance reporting System. 2. Lumbar Radiculitis: S/PLumbar Radiofrequency on 03/17/2017 with relief noted.Adverse Reaction with Gabapentin and Amitriptyline we willContinue to monitor.04/08/2018 3. Cervicalgia/Cervical Spondylosis/Cervical stenosis with radiculitis/ S/P Anterior Cervical Fusion C3-C4 with Dr. Ellene Route on 10/03/2014.3-level anterior decompression and arthrodesis per [facility] on hold.Continue to Monitor.Dr. Ellene Route Following. 04/08/2018 4. Chronic bilateral shoulder pain: Continue with Current medication regime.04/08/2018 S/P Left Reverse Total Shoulder Arthroplasty on 02/01/16 via Dr. Veverly Fells. 5. Muscle Spasm:Continuecurrent treatment withMethocarbamol. 04/08/2018.  20 minutes of face to face patient care time was spent during this visit. All questions were encouraged and answered.  F/U in 1 month

## 2018-04-09 MED ORDER — MEPERIDINE HCL 50 MG PO TABS: 50.0000 mg | ORAL_TABLET | Freq: Three times a day (TID) | ORAL | 0 refills | Status: DC

## 2018-05-13 ENCOUNTER — Encounter: Payer: Self-pay | Admitting: Registered Nurse

## 2018-05-13 ENCOUNTER — Other Ambulatory Visit: Payer: Self-pay

## 2018-05-13 ENCOUNTER — Encounter: Payer: Medicare Other | Attending: Physical Medicine and Rehabilitation | Admitting: Registered Nurse

## 2018-05-13 VITALS — BP 135/79 | HR 84 | Ht 62.0 in | Wt 142.2 lb

## 2018-05-13 DIAGNOSIS — M62838 Other muscle spasm: Secondary | ICD-10-CM | POA: Diagnosis not present

## 2018-05-13 DIAGNOSIS — M542 Cervicalgia: Secondary | ICD-10-CM | POA: Diagnosis not present

## 2018-05-13 DIAGNOSIS — M12811 Other specific arthropathies, not elsewhere classified, right shoulder: Secondary | ICD-10-CM | POA: Diagnosis present

## 2018-05-13 DIAGNOSIS — M5412 Radiculopathy, cervical region: Secondary | ICD-10-CM | POA: Diagnosis not present

## 2018-05-13 DIAGNOSIS — M47816 Spondylosis without myelopathy or radiculopathy, lumbar region: Secondary | ICD-10-CM | POA: Insufficient documentation

## 2018-05-13 DIAGNOSIS — Z79899 Other long term (current) drug therapy: Secondary | ICD-10-CM | POA: Diagnosis present

## 2018-05-13 DIAGNOSIS — Z981 Arthrodesis status: Secondary | ICD-10-CM

## 2018-05-13 DIAGNOSIS — M12812 Other specific arthropathies, not elsewhere classified, left shoulder: Secondary | ICD-10-CM | POA: Diagnosis present

## 2018-05-13 DIAGNOSIS — M47812 Spondylosis without myelopathy or radiculopathy, cervical region: Secondary | ICD-10-CM | POA: Insufficient documentation

## 2018-05-13 DIAGNOSIS — M4712 Other spondylosis with myelopathy, cervical region: Secondary | ICD-10-CM

## 2018-05-13 DIAGNOSIS — M47817 Spondylosis without myelopathy or radiculopathy, lumbosacral region: Secondary | ICD-10-CM

## 2018-05-13 DIAGNOSIS — G894 Chronic pain syndrome: Secondary | ICD-10-CM

## 2018-05-13 DIAGNOSIS — Z5181 Encounter for therapeutic drug level monitoring: Secondary | ICD-10-CM | POA: Diagnosis present

## 2018-05-13 MED ORDER — MEPERIDINE HCL 50 MG PO TABS: 50.0000 mg | ORAL_TABLET | Freq: Three times a day (TID) | ORAL | 0 refills | Status: DC

## 2018-05-13 NOTE — Progress Notes (Signed)
Subjective:    Patient ID: Jenna [name], female    DOB: Oct 09, 1933, 84 y.o.   MRN: [id number]  HPI: [name] is a 84 year old female who returns for follow up appointment for chronic pain and medication refill. She states her pain is located in her neck and lower back radiating into her right lower extremity. [name] reports she has been experiencing increase intensity of pain since the pharmacy wasn't able to fill her prescription fully. [facility] states [facility] will [facility] next [facility], she verbalizes understanding.   [name] Morphine Equivalent is 15.28 MME. Last UDS was Performed on 11/23/2017, it was consistent.    Pain Inventory Average Pain 4 Pain Right Now 3 My pain is intermittent, sharp, aching and throbbing  In the last 24 hours, has pain interfered with the following? General activity 5 Relation with others 3 Enjoyment of life 3 What TIME of day is your pain at its worst? daytime evening Sleep (in general) Good  Pain is worse with: standing and some activites Pain improves with: heat/ice, pacing activities and medication Relief from Meds: 6  Mobility walk without assistance how many minutes can you walk? 15 ability to climb steps?  yes do you drive?  yes  Function retired  Neuro/Psych weakness  Prior Studies Any changes since last visit?  no  Physicians involved in your care Any changes since last visit?  no   Family History  Problem Relation Age of Onset  . Stroke Mother   . Heart attack Father   . Diabetes Brother   . Heart disease Brother    Social History   Socioeconomic History  . Marital status: Married    Spouse name: Not on file  . Number of children: Not on file  . Years of education: Not on file  . Highest education level: Not on file  Occupational History  . Not on file  Social Needs  . Financial resource strain: Not on file  . Food insecurity:    Worry: Not on file   Inability: Not on file  . Transportation needs:    Medical: Not on file    Non-medical: Not on file  Tobacco Use  . Smoking status: Former Smoker    Packs/day: 1.00    Years: 15.00    Pack years: 15.00  . Smokeless tobacco: Never Used  . Tobacco comment: quit smoking in 1985  Substance and Sexual Activity  . Alcohol use: No    Alcohol/week: 3.6 oz    Types: 6 Standard drinks or equivalent per week    Frequency: Never  . Drug use: No  . Sexual activity: Never    Partners: Male    Birth control/protection: Surgical    Comment: hysterectomy  Lifestyle  . Physical activity:    Days per week: Not on file    Minutes per session: Not on file  . Stress: Not on file  Relationships  . Social connections:    Talks on phone: Not on file    Gets together: Not on file    Attends religious service: Not on file    Active member of club or organization: Not on file    Attends meetings of clubs or organizations: Not on file    Relationship status: Not on file  Other Topics Concern  . Not on file  Social History Narrative  . Not on file   Past Surgical History:  Procedure  Laterality Date  . ABDOMINAL HYSTERECTOMY  1982   BSO  . ANTERIOR CERVICAL DECOMP/DISCECTOMY FUSION N/A 10/03/2014   Procedure: ANTERIOR CERVICAL DECOMPRESSION/DISCECTOMY FUSION 1 LEVEL,CERVICAL THREE-FOUR;  Surgeon: Jenna Miss, MD;  Location: [place] [facility];  Service: Neurosurgery;  Laterality: N/A;  . APPENDECTOMY  1986   along with chole  . BREAST BIOPSY Right 1997   sclerosing adenosis -Dr. Lucia [name]  . CHOLECYSTECTOMY    . COLONOSCOPY    . ESOPHAGOGASTRODUODENOSCOPY     with dilitation  . LIPOMA EXCISION Left 10/1999   left thigh  . lumbosacral cyst  2005   seen on injection for low back pain   . LUNG BIOPSY  05/2001   /w Dr.Burney- bronchoscopy, mediastinoscopy  . REVERSE SHOULDER ARTHROPLASTY Left 02/01/2016   Procedure: LEFT REVERSE TOTAL SHOULDER ARTHROPLASTY;  Surgeon: Jenna Cedars, MD;  Location: [place] [facility];   Service: Orthopedics;  Laterality: Left;  . rotator cuff  Bilateral 1996/1998   repair  . ROTATOR CUFF REPAIR Left 1996; 1981  . ROTATOR CUFF REPAIR Right 1996  . TONSILLECTOMY    . TOTAL HIP ARTHROPLASTY Left 10/2000  . TOTAL HIP ARTHROPLASTY Right 06/2005  . TOTAL HIP ARTHROPLASTY Left    10/2000  . VAGINAL DELIVERY     x1   Past Medical History:  Diagnosis Date  . Asthmatic bronchitis   . Cataracts, bilateral    immature  . Chronic back pain    arthritis.Stenosis  . Dysphagia   . Dysrhythmia    (?or extra beats) treated /w atenolol  . Emphysema lung ([place])    left  . GERD (gastroesophageal reflux disease)    takes Omeprazole daily  . Hard of hearing    wears hearing aides   . History of blood transfusion    no abnormal reaction noted  . History of gastric ulcer   . Hypercholesteremia    takes Atorvastatin daily  . Hypertension    takes Coreg and Losartan daily  . Hypothyroidism    takes Synthroid daily  . IBS (irritable bowel syndrome)    takes Librax daily  . Joint pain   . Lumbosacral spondylosis without myelopathy   . Melanoma ([place])    on left leg, wide excision   . Muscle spasm    takes Robaxin daily as needed  . Neuromuscular disorder ([place])    esophageal spasms at times   . Osteoarthritis   . Peripheral edema    take HCTZ daily  . Pneumonia    hx of.Many yrs ago  . 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 Seasonal allergies    takes Allegra daily and uses Flonase daily as needed  . Shortness of breath dyspnea    with exertion  . Venous stasis   . Yeast infection    given Diflucan today   BP 135/79   Pulse 84   Ht 5\' 2"  (1.575 m)   Wt 142 lb 3.2 oz (64.5 kg)   LMP 11/03/1980 (Approximate)   SpO2 95%   BMI 26.01 kg/m   Opioid Risk Score:   Fall Risk Score:  `1  Depression screen PHQ 2/9  Depression screen [facility] 2/9 05/13/2018 04/08/2018 03/10/2018 11/23/2017 07/21/2017 06/30/2017 02/19/2017    Decreased Interest 0 - 0 0 0 0 0  Down, Depressed, Hopeless 0 - 0 0 0 0 0  PHQ - 2 Score 0 - 0 0 0 0 0  Altered sleeping - 0 - - - - -  Tired, decreased energy - - - - - - -  Change in appetite - - - - - - -  Feeling bad or failure about yourself  - - - - - - -  Trouble concentrating - - - - - - -  Moving slowly or fidgety/restless - - - - - - -  Suicidal thoughts - - - - - - -  PHQ-9 Score - - - - - - -  Some recent data might be hidden   Review of Systems  Constitutional: Negative.   HENT: Negative.   Eyes: Negative.   Respiratory: Negative.   Cardiovascular: Negative.   Gastrointestinal: Negative.   Endocrine: Negative.   Genitourinary: Negative.   Musculoskeletal: Negative.   Skin: Negative.   Allergic/Immunologic: Negative.   Neurological: Negative.   Hematological: Negative.   Psychiatric/Behavioral: Negative.   All other systems reviewed and are negative.      Objective:   Physical Exam  Constitutional: She is oriented to person, place, and time. She appears well-developed and well-nourished.  HENT:  Head: Normocephalic and atraumatic.  Neck: Normal range of motion. Neck supple.  Cardiovascular: Normal rate and regular rhythm.  Pulmonary/Chest: Effort normal and breath sounds normal.  Musculoskeletal:  Normal Muscle Bulk and Muscle Testing Reveals:  Upper Extremities: Full ROM and Muscle Strength 5/5 Lumbar Paraspinal Tenderness: L-3-L-5 Lower Extremities: Full ROM and Muscle Strength  5/5 Arises from chair with ease Narrow Based Gait  Neurological: She is alert and oriented to person, place, and time.  Skin: Skin is warm and dry.  Psychiatric: She has a normal mood and affect. Her behavior is normal.  Nursing note and vitals reviewed.         Assessment & Plan:  1.Chronic low back pain withlumbar spondylosis: Continue current medication regimen. 05/13/2018.Refilled:Meperidine 50 mg one tablet three times a day as needed#75.  We will continue  the opioid monitoring program,this consists of regular clinic visits, examinations, urine drug screen, pill counts as well as use of [place] Controlled Substance reporting System. 2. Lumbar Radiculitis: S/PLumbar Radiofrequency on 03/17/2017 with relief noted.Adverse Reaction with Gabapentin and Amitriptyline we willContinue to monitor.05/13/2018 3. Cervicalgia/Cervical Spondylosis/Cervical stenosis with radiculitis/ S/P Anterior Cervical Fusion C3-C4 with Dr. Ellene Route on 10/03/2014.3-level anterior decompression and arthrodesis per [facility] on hold.Continue to Monitor.Dr. Ellene Route Following. 05/13/2018 4. Chronic bilateral shoulder pain: No complaints today. Continue with Current medication regime.05/13/2018 S/P Left Reverse Total Shoulder Arthroplasty on 02/01/16 via Dr. Veverly Fells. 5. Muscle Spasm:Continuecurrent treatment withMethocarbamol. 05/13/2018.  20 minutes of face to face patient care time was spent during this visit. All questions were encouraged and answered.  F/U in 1 month

## 2018-05-14 ENCOUNTER — Telehealth: Payer: Self-pay

## 2018-05-14 MED ORDER — MEPERIDINE HCL 50 MG PO TABS: 50.0000 mg | ORAL_TABLET | Freq: Three times a day (TID) | ORAL | 0 refills | Status: DC

## 2018-05-14 NOTE — Telephone Encounter (Signed)
[facility] for Demerol in their system. Zella Ball has spoken with Mrs Vantassell and will send the Rx to [facility].

## 2018-05-14 NOTE — Telephone Encounter (Signed)
[name] called stating she spoke to the patient to let her know that the medication is on [facility] back [facility]. So she would have to call other pharmacies to see if they use a different manufacturer. Jenna [name] states for now they will put the prescription on hold.

## 2018-05-17 ENCOUNTER — Telehealth: Payer: Self-pay | Admitting: Registered Nurse

## 2018-05-17 NOTE — Telephone Encounter (Signed)
New Message  Pt verbalized she has gotten worse over the weekend and per pt she has scheduled appt with Dr. Letta Pate on 7.26.19 for MBB at 1:45 pm and she wanted to let Danella Sensing know about her progress.

## 2018-05-18 ENCOUNTER — Encounter: Payer: Medicare Other | Admitting: Registered Nurse

## 2018-05-28 ENCOUNTER — Ambulatory Visit: Payer: Medicare Other | Admitting: Physical Medicine & Rehabilitation

## 2018-05-28 ENCOUNTER — Encounter: Payer: Self-pay | Admitting: Physical Medicine & Rehabilitation

## 2018-05-28 VITALS — BP 136/82 | HR 97 | Resp 14 | Ht 63.0 in | Wt 142.0 lb

## 2018-05-28 DIAGNOSIS — M47817 Spondylosis without myelopathy or radiculopathy, lumbosacral region: Secondary | ICD-10-CM

## 2018-05-28 DIAGNOSIS — M47816 Spondylosis without myelopathy or radiculopathy, lumbar region: Secondary | ICD-10-CM | POA: Diagnosis not present

## 2018-05-28 NOTE — Progress Notes (Signed)
  PROCEDURE RECORD [facility]   Name: Jenna [name] DOB:October 19, 1933 MRN: [id number]  Date:05/28/2018  Physician: Alysia Penna, MD    Nurse/CMA: Nicola Quesnell, CMA   Allergies:  Allergies  Allergen Reactions  . Codeine Shortness Of Breath  . Pseudoephedrine Hcl Shortness Of Breath  . Tylenol [Acetaminophen] Shortness Of Breath and Palpitations  . Ultram [Tramadol] Shortness Of Breath  . Vicodin [Hydrocodone-Acetaminophen] Shortness Of Breath  . [place] [Flurazepam Hcl] Other (See Comments)    confusion  . Flurazepam   . Nortriptyline   . Cephalexin Nausea Only  . Clarithromycin Nausea Only  . Vibramycin [Doxycycline Calcium] Rash    Consent Signed: Yes.    Is patient diabetic? No.  CBG today?   Pregnant: No. LMP: Patient's last menstrual period was 11/03/1980 (approximate). (age 65-55)  Anticoagulants: no Anti-inflammatory: no Antibiotics: no  Procedure: right L3,4,5 medial branch block  Position: Prone Start Time: 2:24pm                End Time: 2:33pm  Fluoro Time: 32s  RN/CMA Areesha Dehaven, CMA Pauline Pegues, CMA    Time 2:17pm 2:37pm    BP 136/82 144/91    Pulse 97 83    Respirations 14 14    O2 Sat 91 95    S/S 6 6    Pain Level 6/10 3/10     D/C home with husband , [name], D/C instructions reviewed, and sits independently.

## 2018-05-28 NOTE — Progress Notes (Signed)
Right lumbar L3, L4 medial branch blocks and L5 dorsal ramus injection under fluoroscopic guidance  Indication: Right Lumbar pain which is not relieved by medication management or other conservative care and interfering with self-care and mobility.  Informed consent was obtained after describing risks and benefits of the procedure with the patient, this includes bleeding, bruising, infection, paralysis and medication side effects. The patient wishes to proceed and has given written consent. The patient was placed in a prone position. The lumbar area was marked and prepped with Betadine. One ML of 1% lidocaine was injected into each of 3 areas into the skin and subcutaneous tissue. Then a 22-gauge 3.5 in spinal needle was inserted targeting the junction of the Right S1 superior articular process and sacral ala junction. Needle was advanced under fluoroscopic guidance. Bone contact was made.Isovue 200 was injected x0.5 mL demonstrating no intravascular uptake. Then a solution containing 2% MPF lidocaine was injected x0.5 mL. Then the Right L5 superior articular process in transverse process junction was targeted. Bone contact was made.Isovue 200 was injected x0.5 mL demonstrating no intravascular uptake. Then a solution containing 2% MPF lidocaine was injected x0.5 mL. Then the Right L4 superior articular process in transverse process junction was targeted. Bone contact was made. Isovue 200 was injected x0.5 mL demonstrating no intravascular uptake. Then a solution containing2% MPF lidocaine was injected x0.5 mL Patient tolerated procedure well. Post procedure instructions were given. Please refer to post procedure form. 

## 2018-05-28 NOTE — Patient Instructions (Signed)

## 2018-06-07 ENCOUNTER — Telehealth: Payer: Self-pay | Admitting: Registered Nurse

## 2018-06-07 MED ORDER — MEPERIDINE HCL 50 MG PO TABS: 50.0000 mg | ORAL_TABLET | Freq: Three times a day (TID) | ORAL | 0 refills | Status: DC

## 2018-06-07 NOTE — Telephone Encounter (Signed)
Jenna [name] called the office today, her husband is in the hospital, she doesn't want to leave him alone. According to the PMP Aware Website she picked up her Demerol on 05/14/2018. Her appointment has been re-scheduled and prescription sent to accommodate scheduled appointment. Jenna [name] spoke with Jenna [name] regarding the above and she verbalizes understanding.

## 2018-06-08 ENCOUNTER — Encounter: Payer: Medicare Other | Admitting: Registered Nurse

## 2018-06-23 ENCOUNTER — Encounter: Payer: Medicare Other | Attending: Physical Medicine and Rehabilitation | Admitting: Registered Nurse

## 2018-06-23 ENCOUNTER — Encounter: Payer: Self-pay | Admitting: Registered Nurse

## 2018-06-23 VITALS — BP 121/74 | HR 74 | Ht 62.5 in | Wt 142.0 lb

## 2018-06-23 DIAGNOSIS — M47817 Spondylosis without myelopathy or radiculopathy, lumbosacral region: Secondary | ICD-10-CM

## 2018-06-23 DIAGNOSIS — M79604 Pain in right leg: Secondary | ICD-10-CM

## 2018-06-23 DIAGNOSIS — Z5181 Encounter for therapeutic drug level monitoring: Secondary | ICD-10-CM | POA: Diagnosis present

## 2018-06-23 DIAGNOSIS — Z79899 Other long term (current) drug therapy: Secondary | ICD-10-CM | POA: Diagnosis present

## 2018-06-23 DIAGNOSIS — M47816 Spondylosis without myelopathy or radiculopathy, lumbar region: Secondary | ICD-10-CM | POA: Diagnosis present

## 2018-06-23 DIAGNOSIS — M12811 Other specific arthropathies, not elsewhere classified, right shoulder: Secondary | ICD-10-CM | POA: Diagnosis present

## 2018-06-23 DIAGNOSIS — G894 Chronic pain syndrome: Secondary | ICD-10-CM

## 2018-06-23 DIAGNOSIS — M47812 Spondylosis without myelopathy or radiculopathy, cervical region: Secondary | ICD-10-CM | POA: Diagnosis present

## 2018-06-23 DIAGNOSIS — Z981 Arthrodesis status: Secondary | ICD-10-CM | POA: Diagnosis not present

## 2018-06-23 DIAGNOSIS — M79605 Pain in left leg: Secondary | ICD-10-CM

## 2018-06-23 DIAGNOSIS — M12812 Other specific arthropathies, not elsewhere classified, left shoulder: Secondary | ICD-10-CM | POA: Diagnosis present

## 2018-06-23 MED ORDER — MEPERIDINE HCL 50 MG PO TABS: 50.0000 mg | ORAL_TABLET | Freq: Three times a day (TID) | ORAL | 0 refills | Status: DC

## 2018-06-23 MED FILL — MEPERIDINE 50 MG TABLET: 50 | 15 days supply | Qty: 45 | Fill #0

## 2018-06-23 NOTE — Progress Notes (Signed)
Subjective:    Patient ID: Jenna [name], female    DOB: 09/26/1933, 84 y.o.   MRN: [id number]  HPI: Jenna [name] is a 84 year old female who returns for follow up appointment for chronic pain and medication refill. Sh states her pain is located in her lower back and bilateral lower extremities. She rates her pain 4. Her current exercise regime is walking.   S/P Right Lumbar BBB with Relief Noted.   Jenna [name] Morphine Equivalent is 15.00 MME. Last UDS was Performed 11/23/2017, it was consistent.   Pain Inventory Average Pain 5 Pain Right Now 4 My pain is intermittent and aching  In the last 24 hours, has pain interfered with the following? General activity 6 Relation with others 3 Enjoyment of life 6 What TIME of day is your pain at its worst? daytime Sleep (in general) Good  Pain is worse with: walking, bending and some activites Pain improves with: rest, heat/ice and medication Relief from Meds: 8  Mobility use a cane ability to climb steps?  yes do you drive?  yes  Function retired  Neuro/Psych trouble walking  Prior Studies Any changes since last visit?  no  Physicians involved in your care Any changes since last visit?  no   Family History  Problem Relation Age of Onset  . Stroke Mother   . Heart attack Father   . Diabetes Brother   . Heart disease Brother    Social History   Socioeconomic History  . Marital status: Married    Spouse name: Not on file  . Number of children: Not on file  . Years of education: Not on file  . Highest education level: Not on file  Occupational History  . Not on file  Social Needs  . Financial resource strain: Not on file  . Food insecurity:    Worry: Not on file    Inability: Not on file  . Transportation needs:    Medical: Not on file    Non-medical: Not on file  Tobacco Use  . Smoking status: Former Smoker    Packs/day: 1.00    Years: 15.00    Pack years: 15.00  . Smokeless tobacco: Never Used  .  Tobacco comment: quit smoking in 1985  Substance and Sexual Activity  . Alcohol use: No    Alcohol/week: 6.0 standard drinks    Types: 6 Standard drinks or equivalent per week    Frequency: Never  . Drug use: No  . Sexual activity: Never    Partners: Male    Birth control/protection: Surgical    Comment: hysterectomy  Lifestyle  . Physical activity:    Days per week: Not on file    Minutes per session: Not on file  . Stress: Not on file  Relationships  . Social connections:    Talks on phone: Not on file    Gets together: Not on file    Attends religious service: Not on file    Active member of club or organization: Not on file    Attends meetings of clubs or organizations: Not on file    Relationship status: Not on file  Other Topics Concern  . Not on file  Social History Narrative  . Not on file   Past Surgical History:  Procedure Laterality Date  . ABDOMINAL HYSTERECTOMY  1982   BSO  . ANTERIOR CERVICAL DECOMP/DISCECTOMY FUSION N/A 10/03/2014   Procedure: ANTERIOR CERVICAL DECOMPRESSION/DISCECTOMY FUSION 1 LEVEL,CERVICAL THREE-FOUR;  Surgeon: Mallie Mussel  Ellene Route, MD;  Location: [place] [facility];  Service: Neurosurgery;  Laterality: N/A;  . APPENDECTOMY  1986   along with chole  . BREAST BIOPSY Right 1997   sclerosing adenosis -Dr. Lucia Gaskins  . CHOLECYSTECTOMY    . COLONOSCOPY    . ESOPHAGOGASTRODUODENOSCOPY     with dilitation  . LIPOMA EXCISION Left 10/1999   left thigh  . lumbosacral cyst  2005   seen on injection for low back pain   . LUNG BIOPSY  05/2001   /w Dr.Burney- bronchoscopy, mediastinoscopy  . REVERSE SHOULDER ARTHROPLASTY Left 02/01/2016   Procedure: LEFT REVERSE TOTAL SHOULDER ARTHROPLASTY;  Surgeon: [name], MD;  Location: [place];  Service: Orthopedics;  Laterality: Left;  . rotator cuff  Bilateral 1996/1998   repair  . ROTATOR CUFF REPAIR Left 1996; 1981  . ROTATOR CUFF REPAIR Right 1996  . TONSILLECTOMY    . TOTAL HIP ARTHROPLASTY Left 10/2000  . TOTAL  HIP ARTHROPLASTY Right 06/2005  . TOTAL HIP ARTHROPLASTY Left    10/2000  . VAGINAL DELIVERY     x1   Past Medical History:  Diagnosis Date  . Asthmatic bronchitis   . Cataracts, bilateral    immature  . Chronic back pain    arthritis.Stenosis  . Dysphagia   . Dysrhythmia    (?or extra beats) treated /w atenolol  . Emphysema lung ([place])    left  . GERD (gastroesophageal reflux disease)    takes Omeprazole daily  . Hard of hearing    wears hearing aides   . History of blood transfusion    no abnormal reaction noted  . History of gastric ulcer   . Hypercholesteremia    takes Atorvastatin daily  . Hypertension    takes Coreg and Losartan daily  . Hypothyroidism    takes Synthroid daily  . IBS (irritable bowel syndrome)    takes Librax daily  . Joint pain   . Lumbosacral spondylosis without myelopathy   . Melanoma ([place])    on left leg, wide excision   . Muscle spasm    takes Robaxin daily as needed  . Neuromuscular disorder ([place])    esophageal spasms at times   . Osteoarthritis   . Peripheral edema    take HCTZ daily  . Pneumonia    hx of.Many yrs ago  . 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 Seasonal allergies    takes Allegra daily and uses Flonase daily as needed  . Shortness of breath dyspnea    with exertion  . Venous stasis   . Yeast infection    given Diflucan today   LMP 11/03/1980 (Approximate)   Opioid Risk Score:   Fall Risk Score:  `1  Depression screen PHQ 2/9  Depression screen [facility] 2/9 05/13/2018 04/08/2018 03/10/2018 11/23/2017 07/21/2017 06/30/2017 02/19/2017  Decreased Interest 0 - 0 0 0 0 0  Down, Depressed, Hopeless 0 - 0 0 0 0 0  PHQ - 2 Score 0 - 0 0 0 0 0  Altered sleeping - 0 - - - - -  Tired, decreased energy - - - - - - -  Change in appetite - - - - - - -  Feeling bad or failure about yourself  - - - - - - -  Trouble concentrating - - - - - - -  Moving slowly or fidgety/restless - - - - -  - -  Suicidal thoughts - - - - - - -  PHQ-9 Score - - - - - - -  Some recent data might be hidden     Review of Systems  Constitutional: Negative.   HENT: Negative.   Eyes: Negative.   Respiratory: Negative.   Cardiovascular: Negative.   Gastrointestinal: Negative.   Endocrine: Negative.   Genitourinary: Negative.   Musculoskeletal: Positive for arthralgias, gait problem and myalgias.  Skin: Negative.   Allergic/Immunologic: Negative.   Hematological: Negative.   All other systems reviewed and are negative.      Objective:   Physical Exam  Constitutional: She is oriented to person, place, and time. She appears well-developed and well-nourished.  HENT:  Head: Normocephalic and atraumatic.  Neck: Normal range of motion. Neck supple.  Cardiovascular: Normal rate and regular rhythm.  Pulmonary/Chest: Effort normal and breath sounds normal.  Musculoskeletal:  Normal Muscle Bulk and Muscle Testing Reveals: Upper Extremities: Right: Full ROM and Muscle Strength  5/5 Left: Decreased ROM 90 Degrees and Muscle Strength 5/5 Lower Extremities: Full ROM and Muscle Strength 5/5 Arises from chair with ease Narrow Based Gait  Neurological: She is alert and oriented to person, place, and time.  Skin: Skin is warm and dry.  Psychiatric: She has a normal mood and affect. Her behavior is normal.  Nursing note and vitals reviewed.         Assessment & Plan:  1.Chronic low back pain withlumbar spondylosis: Continue current medication regimen. 06/23/2018.Refilled:Meperidine 50 mg one tablet three times a day as needed#75.  We will continue the opioid monitoring program,this consists of regular clinic visits, examinations, urine drug screen, pill counts as well as use of [place] Controlled Substance reporting System. 2. Lumbar Radiculitis: S/PLumbar Radiofrequency with relief noted.Adverse Reaction with Gabapentin and Amitriptyline we willContinue to  monitor.06/23/2018 3. Cervicalgia/Cervical Spondylosis/Cervical stenosis with radiculitis/ S/P Anterior Cervical Fusion C3-C4 with Dr. Ellene Route on 10/03/2014.3-level anterior decompression and arthrodesis per [facility] on hold.Continue to Monitor.Dr. Ellene Route Following. 06/23/2018 4. Chronic bilateral shoulder pain: No complaints today. Continue with Current medication regime.06/23/2018 S/P Left Reverse Total Shoulder Arthroplasty on 02/01/16 via Dr. Veverly Fells. 5. Muscle Spasm:Continuecurrent treatment withMethocarbamol. 06/23/2018.  20 minutes of face to face patient care time was spent during this visit. All questions were encouraged and answered.  F/U in 1 month

## 2018-07-15 ENCOUNTER — Telehealth: Payer: Self-pay

## 2018-07-15 NOTE — Telephone Encounter (Signed)
Return Jenna [name] call, she did not answer her home phone and no voicemail has been set up on her cell phone. We will try to call her again in the morning.

## 2018-07-15 NOTE — Telephone Encounter (Signed)
Patient called today stating is about to run out of her pain medication, meperidine.  According to last note:  Chronic low back pain withlumbar spondylosis: Continue current medication regimen. 06/23/2018.Refilled:Meperidine 50 mg one tablet three times a day as needed#75.   Examined the current prescription:  meperidine (DEMEROL) 50 MG tablet [[id number]]   Order Details  Dose: 50 mg Route: Oral Frequency: 3 times daily  Indications of Use: Moderate to Severe Pain  Dispense Quantity: 45 tablet Refills: 0       After reviewing the history of this medication for this patient, she has historically recieved 75 tabs of this medication.  Please advise for this patient.

## 2018-07-15 NOTE — Telephone Encounter (Signed)
Was able to speak with Jenna [name], she stated she will look around and see if a pharmacy still has any more of the demerol medication, and she will consider the plain oxycodone since it does not contain tylenol or codeine.

## 2018-07-21 ENCOUNTER — Encounter: Payer: Medicare Other | Admitting: Registered Nurse

## 2018-07-22 ENCOUNTER — Ambulatory Visit
Admission: RE | Admit: 2018-07-22 | Discharge: 2018-07-22 | Disposition: A | Discharge status: Home / Self Care | Payer: Medicare Other | Source: Ambulatory Visit | Attending: Internal Medicine | Admitting: Internal Medicine

## 2018-07-22 ENCOUNTER — Encounter: Payer: Self-pay | Admitting: Registered Nurse

## 2018-07-22 ENCOUNTER — Other Ambulatory Visit: Payer: Self-pay | Admitting: Internal Medicine

## 2018-07-22 ENCOUNTER — Encounter: Payer: Medicare Other | Attending: Physical Medicine and Rehabilitation | Admitting: Registered Nurse

## 2018-07-22 VITALS — BP 142/82 | HR 76 | Resp 14 | Ht 63.0 in | Wt 136.0 lb

## 2018-07-22 DIAGNOSIS — M47817 Spondylosis without myelopathy or radiculopathy, lumbosacral region: Secondary | ICD-10-CM | POA: Diagnosis not present

## 2018-07-22 DIAGNOSIS — Z981 Arthrodesis status: Secondary | ICD-10-CM

## 2018-07-22 DIAGNOSIS — M62838 Other muscle spasm: Secondary | ICD-10-CM | POA: Diagnosis not present

## 2018-07-22 DIAGNOSIS — G894 Chronic pain syndrome: Secondary | ICD-10-CM

## 2018-07-22 DIAGNOSIS — R0602 Shortness of breath: Secondary | ICD-10-CM

## 2018-07-22 DIAGNOSIS — M12812 Other specific arthropathies, not elsewhere classified, left shoulder: Secondary | ICD-10-CM | POA: Diagnosis present

## 2018-07-22 DIAGNOSIS — M47812 Spondylosis without myelopathy or radiculopathy, cervical region: Secondary | ICD-10-CM | POA: Diagnosis present

## 2018-07-22 DIAGNOSIS — M12811 Other specific arthropathies, not elsewhere classified, right shoulder: Secondary | ICD-10-CM | POA: Diagnosis present

## 2018-07-22 DIAGNOSIS — R609 Edema, unspecified: Secondary | ICD-10-CM

## 2018-07-22 DIAGNOSIS — M47816 Spondylosis without myelopathy or radiculopathy, lumbar region: Secondary | ICD-10-CM | POA: Insufficient documentation

## 2018-07-22 DIAGNOSIS — Z79899 Other long term (current) drug therapy: Secondary | ICD-10-CM

## 2018-07-22 DIAGNOSIS — Z5181 Encounter for therapeutic drug level monitoring: Secondary | ICD-10-CM | POA: Diagnosis present

## 2018-07-22 MED ORDER — OXYCODONE HCL 5 MG PO TABS: 5.0000 mg | ORAL_TABLET | Freq: Three times a day (TID) | ORAL | 0 refills | Status: DC | PRN

## 2018-07-22 NOTE — Patient Instructions (Signed)
Don't Pick up Oxycodone, until I call you. I have a call out to the Pharmacist at [facility] regarding your allergies and Oxycodone.

## 2018-07-22 NOTE — Progress Notes (Signed)
Subjective:    Patient ID: Jenna [name], female    DOB: 15-Jul-1933, 84 y.o.   MRN: [id number]  HPI: Jenna [name] is a 84 year old female who returns for follow up appointment for chronic pain and medication refill. She states her pain is located in her lower back radiating into her bilateral lower extremities. She rates her pain 4. Her current exercise regimen is walking.   Jenna [name] is 15.00 MME. Last UDS was Performed on 11/23/2017, it was consistent.   Jenna [name] is currently prescribed Meperidine, they are no longer manufacturing this medication. This provider has called several pharmacies and they also stated the above and they don't have any meperidine available. Jenna [name] is not able to be prescribe Oxycodone, Morphine or Butrans due to her allergies to codeine, Tramadol, Hydrocodone her adverse reaction was shortness of breath. This provider spoke with the Pharmacist at [facility]  ( Repauly)regarding the above, she reviewed Jenna [name] allergies and agrees she cannot be prescribe the above medications.   The only analgesic she can be prescribed is Tapentadol due to her allergies. We are asking for an approval for the Tapentadol, we realize this is non-formulary, due to the above adverse reactions to other analgesics this is the best recommendation for Jenna [name].    Pain Inventory Average Pain 4 Pain Right Now 4 My pain is intermittent, sharp and aching  In the last 24 hours, has pain interfered with the following? General activity 4 Relation with others 3 Enjoyment of life 3 What TIME of day is your pain at its worst? daytime, evening Sleep (in general) Good  Pain is worse with: walking, standing and some activites Pain improves with: rest, pacing activities and medication Relief from Meds: 6  Mobility walk without assistance how many minutes can you walk? 10 ability to climb steps?  yes do you drive?  yes Do you have any goals  in this area?  yes  Function retired Do you have any goals in this area?  no  Neuro/Psych weakness  Prior Studies Any changes since last visit?  no  Physicians involved in your care Any changes since last visit?  no   Family History  Problem Relation Age of Onset  . Stroke Mother   . Heart attack Father   . Diabetes Brother   . Heart disease Brother    Social History   Socioeconomic History  . Marital status: Married    Spouse name: Not on file  . Number of children: Not on file  . Years of education: Not on file  . Highest education level: Not on file  Occupational History  . Not on file  Social Needs  . Financial resource strain: Not on file  . Food insecurity:    Worry: Not on file    Inability: Not on file  . Transportation needs:    Medical: Not on file    Non-medical: Not on file  Tobacco Use  . Smoking status: Former Smoker    Packs/day: 1.00    Years: 15.00    Pack years: 15.00  . Smokeless tobacco: Never Used  . Tobacco comment: quit smoking in 1985  Substance and Sexual Activity  . Alcohol use: No    Alcohol/week: 6.0 standard drinks    Types: 6 Standard drinks or [name] per week    Frequency: Never  . Drug use: No  . Sexual activity: Never    Partners:  Male    Birth control/protection: Surgical    Comment: hysterectomy  Lifestyle  . Physical activity:    Days per week: Not on file    Minutes per session: Not on file  . Stress: Not on file  Relationships  . Social connections:    Talks on phone: Not on file    Gets together: Not on file    Attends religious service: Not on file    Active member of club or organization: Not on file    Attends meetings of clubs or organizations: Not on file    Relationship status: Not on file  Other Topics Concern  . Not on file  Social History Narrative  . Not on file   Past Surgical History:  Procedure Laterality Date  . ABDOMINAL HYSTERECTOMY  1982   BSO  . ANTERIOR CERVICAL  DECOMP/DISCECTOMY FUSION N/A 10/03/2014   Procedure: ANTERIOR CERVICAL DECOMPRESSION/DISCECTOMY FUSION 1 LEVEL,CERVICAL THREE-FOUR;  Surgeon: [name], MD;  Location: [facility];  Service: Neurosurgery;  Laterality: N/A;  . APPENDECTOMY  1986   along with chole  . BREAST BIOPSY Right 1997   sclerosing adenosis -Dr. Lucia Gaskins  . CHOLECYSTECTOMY    . COLONOSCOPY    . ESOPHAGOGASTRODUODENOSCOPY     with dilitation  . LIPOMA EXCISION Left 10/1999   left thigh  . lumbosacral cyst  2005   seen on injection for low back pain   . LUNG BIOPSY  05/2001   /w Dr.Burney- bronchoscopy, mediastinoscopy  . REVERSE SHOULDER ARTHROPLASTY Left 02/01/2016   Procedure: LEFT REVERSE TOTAL SHOULDER ARTHROPLASTY;  Surgeon: [name], MD;  Location: [facility];  Service: Orthopedics;  Laterality: Left;  . rotator cuff  Bilateral 1996/1998   repair  . ROTATOR CUFF REPAIR Left 1996; 1981  . ROTATOR CUFF REPAIR Right 1996  . TONSILLECTOMY    . TOTAL HIP ARTHROPLASTY Left 10/2000  . TOTAL HIP ARTHROPLASTY Right 06/2005  . TOTAL HIP ARTHROPLASTY Left    10/2000  . VAGINAL DELIVERY     x1   Past Medical History:  Diagnosis Date  . Asthmatic bronchitis   . Cataracts, bilateral    immature  . Chronic back pain    arthritis.Stenosis  . Dysphagia   . Dysrhythmia    (?or extra beats) treated /w atenolol  . Emphysema lung ([place])    left  . GERD (gastroesophageal reflux disease)    takes Omeprazole daily  . Hard of hearing    wears hearing aides   . History of blood transfusion    no abnormal reaction noted  . History of gastric ulcer   . Hypercholesteremia    takes Atorvastatin daily  . Hypertension    takes Coreg and Losartan daily  . Hypothyroidism    takes Synthroid daily  . IBS (irritable bowel syndrome)    takes Librax daily  . Joint pain   . Lumbosacral spondylosis without myelopathy   . Melanoma ([place])    on left leg, wide excision   . Muscle spasm    takes Robaxin daily as needed  .  Neuromuscular disorder ([place])    esophageal spasms at times   . Osteoarthritis   . Peripheral edema    take HCTZ daily  . Pneumonia    hx of.Many yrs ago  . 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 Seasonal allergies    takes Allegra daily and uses Flonase daily as needed  . Shortness of  breath dyspnea    with exertion  . Venous stasis   . Yeast infection    given Diflucan today   BP (!) 142/82 (BP Location: Left Arm, Patient Position: Sitting, Cuff Size: Normal)   Pulse 76   Resp 14   Ht 5\' 3"  (1.6 m)   Wt 136 lb (61.7 kg)   LMP 11/03/1980 (Approximate)   SpO2 92%   BMI 24.09 kg/m   Opioid Risk Score:   Fall Risk Score:  `1  Depression screen PHQ 2/9  Depression screen [facility] 2/9 05/13/2018 04/08/2018 03/10/2018 11/23/2017 07/21/2017 06/30/2017 02/19/2017  Decreased Interest 0 - 0 0 0 0 0  Down, Depressed, Hopeless 0 - 0 0 0 0 0  PHQ - 2 Score 0 - 0 0 0 0 0  Altered sleeping - 0 - - - - -  Tired, decreased energy - - - - - - -  Change in appetite - - - - - - -  Feeling bad or failure about yourself  - - - - - - -  Trouble concentrating - - - - - - -  Moving slowly or fidgety/restless - - - - - - -  Suicidal thoughts - - - - - - -  PHQ-9 Score - - - - - - -  Some recent data might be hidden    Review of Systems  Constitutional: Negative.   HENT: Negative.   Eyes: Negative.   Respiratory: Negative.   Cardiovascular: Negative.   Gastrointestinal: Negative.   Endocrine: Negative.   Genitourinary: Negative.   Musculoskeletal: Positive for arthralgias, back pain, neck pain and neck stiffness.  Skin: Negative.   Allergic/Immunologic: Negative.   Neurological: Positive for weakness.  Hematological: Negative.   Psychiatric/Behavioral: Negative.   All other systems reviewed and are negative.      Objective:   Physical Exam  Constitutional: She is oriented to person, place, and time. She appears well-developed and  well-nourished.  HENT:  Head: Normocephalic and atraumatic.  Neck: Normal range of motion. Neck supple.  Cardiovascular: Normal rate and regular rhythm.  Pulmonary/Chest: Effort normal and breath sounds normal.  Musculoskeletal:  Normal Muscle Bulk and Muscle Testing Reveals: Upper Extremities: Decreased ROM 90 Degrees and Muscle Strength 4/5 Lumbar Paraspinal Tenderness: L-3-L-5 Lower Extremities: Full ROM and Muscle Strength 5/5 Arises from chair slowly Narrow Based gait  Neurological: She is alert and oriented to person, place, and time.  Skin: Skin is warm and dry.  Psychiatric: She has a normal mood and affect. Her behavior is normal.  Nursing note and vitals reviewed.         Assessment & Plan:  1.Chronic low back pain withlumbar spondylosis:RX: Nucynta 50 mg daily as needed for pain. Discontinued Meperidine 50 mg medication no longer available. 07/22/2018. We will continue the opioid monitoring program,this consists of regular clinic visits, examinations, urine drug screen, pill counts as well as use of [place] Controlled Substance reporting System. 2. Lumbar Radiculitis: S/PLumbar Radiofrequency with relief noted.Adverse Reaction with Gabapentin and Amitriptyline we willContinue to monitor.07/22/2018 3. Cervicalgia/Cervical Spondylosis/Cervical stenosis with radiculitis/ S/P Anterior Cervical Fusion C3-C4 with Dr. Ellene Route on 10/03/2014.3-level anterior decompression and arthrodesis per [facility] on hold.Continue to Monitor.Dr. Ellene Route Following. 07/22/2018 4. Chronic bilateral shoulder pain:No complaints today.Continue with Current medication regime.07/22/2018 S/P Left Reverse Total Shoulder Arthroplasty on 02/01/16 via Dr. Veverly Fells. 5. Muscle Spasm: No complaints today. Continueto monitor.   20 minutes of face to face patient care time was spent during  this visit. All questions were encouraged and answered.  F/U in 1  month

## 2018-07-23 ENCOUNTER — Telehealth: Payer: Self-pay | Admitting: Registered Nurse

## 2018-07-23 MED ORDER — OXYCODONE HCL 5 MG PO TABS: 5.0000 mg | ORAL_TABLET | Freq: Three times a day (TID) | ORAL | 0 refills | Status: DC | PRN

## 2018-07-23 MED ORDER — TAPENTADOL HCL 50 MG PO TABS: 50.0000 mg | ORAL_TABLET | Freq: Every day | ORAL | 0 refills | Status: DC | PRN

## 2018-07-23 NOTE — Telephone Encounter (Signed)
Placed a call to [name], This provider spoke with Dr. Letta [name] regarding [name] not being available from Manufacturer. This provider prescribed Jenna [name]  and Jenna [name], it was  Approved the cost will be 100.00 dollars for 30 tablets.  Placed a call to [name] she reports she was prescribed Jenna [name] in the past. Reviewed the medication list when she was hospitalized in 2015, Dr. Ellene [name] prescribed Jenna [name] 5 mg, she doesn't recall any adverse reaction. Due to the cost of the Nucynta, [name] would like to be prescribed Jenna [name]. This was discussed with Jenna [name] he agrees with plan. [name] is aware of the above and verbalizes understanding.

## 2018-08-02 ENCOUNTER — Encounter: Payer: Self-pay | Admitting: Certified Nurse Midwife

## 2018-08-17 ENCOUNTER — Encounter: Payer: Medicare Other | Attending: Physical Medicine and Rehabilitation | Admitting: Registered Nurse

## 2018-08-17 ENCOUNTER — Encounter: Payer: Self-pay | Admitting: Registered Nurse

## 2018-08-17 VITALS — BP 139/88 | HR 76 | Resp 14 | Ht 63.0 in | Wt 140.0 lb

## 2018-08-17 DIAGNOSIS — M12811 Other specific arthropathies, not elsewhere classified, right shoulder: Secondary | ICD-10-CM | POA: Insufficient documentation

## 2018-08-17 DIAGNOSIS — Z981 Arthrodesis status: Secondary | ICD-10-CM | POA: Diagnosis not present

## 2018-08-17 DIAGNOSIS — G894 Chronic pain syndrome: Secondary | ICD-10-CM

## 2018-08-17 DIAGNOSIS — M47816 Spondylosis without myelopathy or radiculopathy, lumbar region: Secondary | ICD-10-CM | POA: Diagnosis not present

## 2018-08-17 DIAGNOSIS — M47817 Spondylosis without myelopathy or radiculopathy, lumbosacral region: Secondary | ICD-10-CM

## 2018-08-17 DIAGNOSIS — Z5181 Encounter for therapeutic drug level monitoring: Secondary | ICD-10-CM | POA: Diagnosis present

## 2018-08-17 DIAGNOSIS — Z79891 Long term (current) use of opiate analgesic: Secondary | ICD-10-CM

## 2018-08-17 DIAGNOSIS — M47812 Spondylosis without myelopathy or radiculopathy, cervical region: Secondary | ICD-10-CM | POA: Insufficient documentation

## 2018-08-17 DIAGNOSIS — Z79899 Other long term (current) drug therapy: Secondary | ICD-10-CM | POA: Insufficient documentation

## 2018-08-17 DIAGNOSIS — M5416 Radiculopathy, lumbar region: Secondary | ICD-10-CM

## 2018-08-17 DIAGNOSIS — M12812 Other specific arthropathies, not elsewhere classified, left shoulder: Secondary | ICD-10-CM | POA: Insufficient documentation

## 2018-08-17 NOTE — Progress Notes (Signed)
Subjective:    Patient ID: Jenna [name], female    DOB: Apr 08, 1933, 84 y.o.   MRN: [id number]  HPI: Jenna [name] is a 84 year old female who returns for follow up appointment for chronic pain and medication refill. She states her pain is located in her lower back radiating into her bilateral lower extremities. She rates her pain 5. Her current exercise regime is walking.   Ms. Daily reports she had two episodes of nightmares, since Oxycodone began. We will continue to monitor. At this time she doesn't want to change her analgesic, we discussed Tapentadol and her allergy list was reviewed. She was instructed to call office with her decision, no prescription given.   [name] Morphine Equivalent is 22.50 MME. She is also prescribed Diazepam by Dr.Roberts, she uses it sparingly, last fill date 11/09/2017..We have discussed the black box warning of using opioids and benzodiazepines. I highlighted the dangers of using these drugs together and discussed the adverse events including respiratory suppression, overdose, cognitive impairment and importance of compliance with current regimen. We will continue to monitor and adjust as indicated.   Last UDS was Performed on 11/23/2017, it was consistent.    Pain Inventory Average Pain 5 Pain Right Now 5 My pain is sharp and aching  In the last 24 hours, has pain interfered with the following? General activity 5 Relation with others 3 Enjoyment of life 4 What TIME of day is your pain at its worst? daytime, evening  Sleep (in general) Fair  Pain is worse with: standing and some activites Pain improves with: rest and medication Relief from Meds: 6  Mobility walk without assistance use a cane how many minutes can you walk? 10-15 ability to climb steps?  yes do you drive?  yes Do you have any goals in this area?  yes  Function retired  Neuro/Psych weakness trouble walking  Prior Studies Any changes since last visit?   no  Physicians involved in your care Any changes since last visit?  no   Family History  Problem Relation Age of Onset  . Stroke Mother   . Heart attack Father   . Diabetes Brother   . Heart disease Brother    Social History   Socioeconomic History  . Marital status: Married    Spouse name: Not on file  . Number of children: Not on file  . Years of education: Not on file  . Highest education level: Not on file  Occupational History  . Not on file  Social Needs  . Financial resource strain: Not on file  . Food insecurity:    Worry: Not on file    Inability: Not on file  . Transportation needs:    Medical: Not on file    Non-medical: Not on file  Tobacco Use  . Smoking status: Former Smoker    Packs/day: 1.00    Years: 15.00    Pack years: 15.00  . Smokeless tobacco: Never Used  . Tobacco comment: quit smoking in 1985  Substance and Sexual Activity  . Alcohol use: No    Alcohol/week: 6.0 standard drinks    Types: 6 Standard drinks or equivalent per week    Frequency: Never  . Drug use: No  . Sexual activity: Never    Partners: Male    Birth control/protection: Surgical    Comment: hysterectomy  Lifestyle  . Physical activity:    Days per week: Not on file    Minutes per  session: Not on file  . Stress: Not on file  Relationships  . Social connections:    Talks on phone: Not on file    Gets together: Not on file    Attends religious service: Not on file    Active member of club or organization: Not on file    Attends meetings of clubs or organizations: Not on file    Relationship status: Not on file  Other Topics Concern  . Not on file  Social History Narrative  . Not on file   Past Surgical History:  Procedure Laterality Date  . ABDOMINAL HYSTERECTOMY  1982   BSO  . ANTERIOR CERVICAL DECOMP/DISCECTOMY FUSION N/A 10/03/2014   Procedure: ANTERIOR CERVICAL DECOMPRESSION/DISCECTOMY FUSION 1 LEVEL,CERVICAL THREE-FOUR;  Surgeon: [name], MD;   Location: [facility];  Service: Neurosurgery;  Laterality: N/A;  . APPENDECTOMY  1986   along with chole  . BREAST BIOPSY Right 1997   sclerosing adenosis -Dr. Lucia Gaskins  . CHOLECYSTECTOMY    . COLONOSCOPY    . ESOPHAGOGASTRODUODENOSCOPY     with dilitation  . LIPOMA EXCISION Left 10/1999   left thigh  . lumbosacral cyst  2005   seen on injection for low back pain   . LUNG BIOPSY  05/2001   /w Dr.Burney- bronchoscopy, mediastinoscopy  . REVERSE SHOULDER ARTHROPLASTY Left 02/01/2016   Procedure: LEFT REVERSE TOTAL SHOULDER ARTHROPLASTY;  Surgeon: [name], MD;  Location: [facility];  Service: Orthopedics;  Laterality: Left;  . rotator cuff  Bilateral 1996/1998   repair  . ROTATOR CUFF REPAIR Left 1996; 1981  . ROTATOR CUFF REPAIR Right 1996  . TONSILLECTOMY    . TOTAL HIP ARTHROPLASTY Left 10/2000  . TOTAL HIP ARTHROPLASTY Right 06/2005  . TOTAL HIP ARTHROPLASTY Left    10/2000  . VAGINAL DELIVERY     x1   Past Medical History:  Diagnosis Date  . Asthmatic bronchitis   . Cataracts, bilateral    immature  . Chronic back pain    arthritis.Stenosis  . Dysphagia   . Dysrhythmia    (?or extra beats) treated /w atenolol  . Emphysema lung ([place])    left  . GERD (gastroesophageal reflux disease)    takes Omeprazole daily  . Hard of hearing    wears hearing aides   . History of blood transfusion    no abnormal reaction noted  . History of gastric ulcer   . Hypercholesteremia    takes Atorvastatin daily  . Hypertension    takes Coreg and Losartan daily  . Hypothyroidism    takes Synthroid daily  . IBS (irritable bowel syndrome)    takes Librax daily  . Joint pain   . Lumbosacral spondylosis without myelopathy   . Melanoma ([place])    on left leg, wide excision   . Muscle spasm    takes Robaxin daily as needed  . Neuromuscular disorder ([place])    esophageal spasms at times   . Osteoarthritis   . Peripheral edema    take HCTZ daily  . Pneumonia    hx of.Many yrs ago  .  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 Seasonal allergies    takes Allegra daily and uses Flonase daily as needed  . Shortness of breath dyspnea    with exertion  . Venous stasis   . Yeast infection    given Diflucan today   BP 139/88 (BP Location: Left Arm, Patient Position:  Sitting, Cuff Size: Normal)   Pulse 76   Resp 14   Ht 5\' 3"  (1.6 m)   Wt 140 lb (63.5 kg)   LMP 11/03/1980 (Approximate)   SpO2 95%   BMI 24.80 kg/m   Opioid Risk Score:   Fall Risk Score:  `1  Depression screen PHQ 2/9  Depression screen [facility] 2/9 05/13/2018 04/08/2018 03/10/2018 11/23/2017 07/21/2017 06/30/2017 02/19/2017  Decreased Interest 0 - 0 0 0 0 0  Down, Depressed, Hopeless 0 - 0 0 0 0 0  PHQ - 2 Score 0 - 0 0 0 0 0  Altered sleeping - 0 - - - - -  Tired, decreased energy - - - - - - -  Change in appetite - - - - - - -  Feeling bad or failure about yourself  - - - - - - -  Trouble concentrating - - - - - - -  Moving slowly or fidgety/restless - - - - - - -  Suicidal thoughts - - - - - - -  PHQ-9 Score - - - - - - -  Some recent data might be hidden    Review of Systems  HENT: Negative.   Eyes: Negative.   Respiratory: Negative.   Cardiovascular: Negative.   Gastrointestinal: Negative.   Endocrine: Negative.   Genitourinary: Negative.   Musculoskeletal: Positive for back pain, gait problem and neck pain.  Skin: Negative.   Allergic/Immunologic: Negative.   Neurological: Positive for weakness.  Hematological: Negative.   Psychiatric/Behavioral: Negative.   All other systems reviewed and are negative.      Objective:   Physical Exam  Constitutional: She is oriented to person, place, and time. She appears well-developed and well-nourished.  HENT:  Head: Normocephalic and atraumatic.  Neck: Normal range of motion. Neck supple.  Cardiovascular: Normal rate and regular rhythm.  Pulmonary/Chest: Effort normal and breath sounds normal.   Musculoskeletal:  Normal Muscle Bulk and Muscle Testing Reveals: Upper Extremities: Full ROM and Muscle Strength 5/5 Back without spinal Tenderness Lower Extremities: Full ROM and Muscle Strength 5/5 Arises from Table with Ease Narrow Based Gait  Neurological: She is alert and oriented to person, place, and time.  Skin: Skin is warm and dry.  Psychiatric: She has a normal mood and affect. Her behavior is normal.  Nursing note and vitals reviewed.         Assessment & Plan:  1.Chronic low back pain withlumbar spondylosis:Continue Oxycodone. Ms. Huckaba will call with her decision regarding continuing oxycodone or Tapentadol. Discontinued Meperidine 50 mg medication no longer available. 08/17/2018. We will continue the opioid monitoring program,this consists of regular clinic visits, examinations, urine drug screen, pill counts as well as use of [place] Controlled Substance reporting System. 2. Lumbar Radiculitis: S/PLumbar Radiofrequency with relief noted.Adverse Reaction with Gabapentin and Amitriptyline we willContinue to monitor.08/17/2018 3. Cervicalgia/Cervical Spondylosis/Cervical stenosis with radiculitis/ S/P Anterior Cervical Fusion C3-C4 with Dr. Ellene Route on 10/03/2014.3-level anterior decompression and arthrodesis per [facility] on hold.Continue to Monitor.Dr. Ellene Route Following. 08/17/2018 4. Chronic bilateral shoulder pain:No complaints today.Continue with Current medication regime.08/17/2018 S/P Left Reverse Total Shoulder Arthroplasty on 02/01/16 via Dr. Veverly Fells. 5. Muscle Spasm: No complaints today. Continueto monitor.   20 minutes of face to face patient care time was spent during this visit. All questions were encouraged and answered.  F/U in 1 month

## 2018-08-17 NOTE — Patient Instructions (Signed)
Call Office next week to discussed Medication Management Tapentadol immediate-release oral tablets What is this medicine? TAPENTADOL (ta PEN ta dol) is a pain reliever. It is used to treat moderate to severe pain. This medicine may be used for other purposes; ask your health care provider or pharmacist if you have questions. COMMON BRAND NAME(S): Nucynta What should I tell my health care provider before I take this medicine? They need to know if you have any of these conditions: -gallbladder disease -head injury -history of a drug or alcohol abuse problem -if you often drink alcohol -kidney disease -liver disease -lung or breathing disease, like asthma -prostate disease -seizures -stomach or intestine problems -thyroid disease -an unusual or allergic reaction to tapentadol, other medicines, foods, dyes, or preservatives -pregnant or trying to get pregnant -breast-feeding How should I use this medicine? Take this medicine by mouth with a glass of water. If the medicine upsets your stomach, take it with food or milk. Follow the directions on the prescription label. Do not take more than you are told to take. A special MedGuide will be given to you by the pharmacist with each prescription and refill. Be sure to read this information carefully each time. Talk to your pediatrician regarding the use of this medicine in children. Special care may be needed. Overdosage: If you think you have taken too much of this medicine contact a poison control center or emergency room at once. NOTE: This medicine is only for you. Do not share this medicine with others. What if I miss a dose? If you miss a dose, take it as soon as you can. If it is almost time for your next dose, take only that dose. Do not take double or extra doses. What may interact with this medicine? Do not take this medicine with any of the following medications: -MAOIs like Carbex, Eldepryl, Marplan, Nardil, and Parnate This  medicine may also interact with the following medications: -alcohol -antihistamines for allergy, cough and cold -atropine -certain medicines for anxiety or sleep -certain medicines for bladder problems like oxybutynin, tolterodine -certain medicines for depression like amitriptyline, fluoxetine, sertraline -certain medicines for migraine headache like almotriptan, eletriptan, frovatriptan, naratriptan, rizatriptan, sumatriptan, zolmitriptan -certain medicines for Parkinson's disease like benztropine, trihexyphenidyl -certain medicines for seizures like phenobarbital, primidone -certain medicines for stomach problems like dicyclomine, hyoscyamine -certain medicines for travel sickness like scopolamine -general anesthetics like halothane, isoflurane, methoxyflurane, propofol -ipratropium -local anesthetics like lidocaine, pramoxine, tetracaine -medicines that relax muscles for surgery -other narcotic medicines for pain or cough -phenothiazines like chlorpromazine, mesoridazine, prochlorperazine, thioridazine This list may not describe all possible interactions. Give your health care provider a list of all the medicines, herbs, non-prescription drugs, or dietary supplements you use. Also tell them if you smoke, drink alcohol, or use illegal drugs. Some items may interact with your medicine. What should I watch for while using this medicine? Tell your doctor or health care professional if your pain does not go away, if it gets worse, or if you have new or a different type of pain. You may develop tolerance to the medicine. Tolerance means that you will need a higher dose of the medicine for pain relief. Tolerance is normal and is expected if you take the medicine for a long time. Do not suddenly stop taking your medicine because you may develop a severe reaction. Your body becomes used to the medicine. This does NOT mean you are addicted. Addiction is a behavior related to getting and using a drug  for a non-medical reason. If you have pain, you have a medical reason to take pain medicine. Your doctor will tell you how much medicine to take. If your doctor wants you to stop the medicine, the dose will be slowly lowered over time to avoid any side effects. There are different types of narcotic medicines (opiates). If you take more than one type at the same time or if you are taking another medicine that also causes drowsiness, you may have more side effects. Give your health care provider a list of all medicines you use. Your doctor will tell you how much medicine to take. Do not take more medicine than directed. Call emergency for help if you have problems breathing or unusual sleepiness. You may get drowsy or dizzy. Do not drive, use machinery, or do anything that needs mental alertness until you know how this medicine affects you. Do not stand or sit up quickly, especially if you are an older patient. This reduces the risk of dizzy or fainting spells. Alcohol may interfere with the effect of this medicine. Avoid alcoholic drinks. This medicine will cause constipation. Try to have a bowel movement at least every 2 to 3 days. If you do not have a bowel movement for 3 days, call your doctor or health care professional. Your mouth may get dry. Chewing sugarless gum or sucking heard candy, and drinking plenty of water may help. Contact your doctor if the problem does not go away or is severe. What side effects may I notice from receiving this medicine? Side effects that you should report to your doctor or health care professional as soon as possible: -allergic reactions like skin rash, itching or hives, swelling of the face, lips, or tongue -breathing problems -confusion -seizures -signs and symptoms of low bleed pressure like dizziness; feeling faint or lightheaded, falls; unusually weak or tired -trouble passing urine or change in the amount of urine Side effects that usually do not require medical  attention (report to your doctor or health care professional if they continue or are bothersome): -constipation -dry mouth -nausea, vomiting -tiredness This list may not describe all possible side effects. Call your doctor for medical advice about side effects. You may report side effects to FDA at 1-800-FDA-1088. Where should I keep my medicine? Keep out of the reach of children. This medicine can be abused. Keep your medicine in a safe place to protect it from theft. Do not share this medicine with anyone. Selling or giving away this medicine is dangerous and against the law. Store at room temperature between 15 and 30 degrees C (59 and 86 degrees F). Protect from moisture. This medicine may cause accidental overdose and death if it is taken by other adults, children, or pets. Flush any unused medicine down the toilet to reduce the chance of harm. Do not use the medicine after the expiration date. NOTE: This sheet is a summary. It may not cover all possible information. If you have questions about this medicine, talk to your doctor, pharmacist, or health care provider.  2018 Elsevier/Gold Standard (2015-11-22 12:37:22)

## 2018-08-22 LAB — TOXASSURE SELECT,+ANTIDEPR,UR

## 2018-08-25 ENCOUNTER — Telehealth: Payer: Self-pay | Admitting: *Deleted

## 2018-08-25 NOTE — Telephone Encounter (Signed)
Urine drug screen for this encounter is consistent for prescribed medication. She has been prescribed diazepam in the past.

## 2018-09-02 ENCOUNTER — Telehealth: Payer: Self-pay | Admitting: Physical Medicine & Rehabilitation

## 2018-09-02 NOTE — Telephone Encounter (Signed)
Patient was told by Zella Ball to call her this week.

## 2018-09-03 NOTE — Telephone Encounter (Signed)
Sybil RN called Ms. Rooks on 09/02/2018, no answer.  Placed a call to Ms. Waggle her husband asked for me to call her on cell phone. Called her cell-phone no answer, voicemail not set up. Will await her return call.

## 2018-09-13 ENCOUNTER — Encounter: Payer: Self-pay | Admitting: Registered Nurse

## 2018-09-13 ENCOUNTER — Encounter: Payer: Medicare Other | Attending: Physical Medicine and Rehabilitation | Admitting: Registered Nurse

## 2018-09-13 ENCOUNTER — Other Ambulatory Visit: Payer: Self-pay

## 2018-09-13 VITALS — BP 125/84 | HR 109 | Resp 14 | Ht 63.0 in | Wt 139.4 lb

## 2018-09-13 DIAGNOSIS — Z981 Arthrodesis status: Secondary | ICD-10-CM | POA: Diagnosis not present

## 2018-09-13 DIAGNOSIS — Z79899 Other long term (current) drug therapy: Secondary | ICD-10-CM | POA: Insufficient documentation

## 2018-09-13 DIAGNOSIS — M47817 Spondylosis without myelopathy or radiculopathy, lumbosacral region: Secondary | ICD-10-CM | POA: Diagnosis not present

## 2018-09-13 DIAGNOSIS — M12811 Other specific arthropathies, not elsewhere classified, right shoulder: Secondary | ICD-10-CM | POA: Diagnosis present

## 2018-09-13 DIAGNOSIS — M542 Cervicalgia: Secondary | ICD-10-CM | POA: Diagnosis not present

## 2018-09-13 DIAGNOSIS — M47816 Spondylosis without myelopathy or radiculopathy, lumbar region: Secondary | ICD-10-CM | POA: Insufficient documentation

## 2018-09-13 DIAGNOSIS — M4712 Other spondylosis with myelopathy, cervical region: Secondary | ICD-10-CM | POA: Diagnosis not present

## 2018-09-13 DIAGNOSIS — Z5181 Encounter for therapeutic drug level monitoring: Secondary | ICD-10-CM | POA: Diagnosis present

## 2018-09-13 DIAGNOSIS — M47812 Spondylosis without myelopathy or radiculopathy, cervical region: Secondary | ICD-10-CM | POA: Insufficient documentation

## 2018-09-13 DIAGNOSIS — G894 Chronic pain syndrome: Secondary | ICD-10-CM

## 2018-09-13 DIAGNOSIS — M12812 Other specific arthropathies, not elsewhere classified, left shoulder: Secondary | ICD-10-CM | POA: Diagnosis present

## 2018-09-13 DIAGNOSIS — Z79891 Long term (current) use of opiate analgesic: Secondary | ICD-10-CM

## 2018-09-13 MED ORDER — OXYCODONE HCL 5 MG PO TABS: 5.0000 mg | ORAL_TABLET | Freq: Three times a day (TID) | ORAL | 0 refills | Status: DC | PRN

## 2018-09-13 NOTE — Progress Notes (Signed)
Subjective:    Patient ID: Jenna [name], female    DOB: [birth year], 84 y.o.   MRN: [id number]  HPI: Jenna [name] is a 84 year old female who returns for follow up appointment for chronic pain and medication refill. She states her pain is located in her neck and lower back. She rates her pain 3. Her current exercise regime is walking.   [name] Morphine equivalent is 22.50 MME. Last UDS was Performed on 08/17/2018, it was consistent.  Pain Inventory Average Pain 5 Pain Right Now 3 My pain is constant, sharp and aching  In the last 24 hours, has pain interfered with the following? General activity 5 Relation with others 3 Enjoyment of life 3 What TIME of day is your pain at its worst? daytime and evening Sleep (in general) Fair  Pain is worse with: standing and some activites Pain improves with: rest, heat/ice, pacing activities and medication Relief from Meds: 6  Mobility walk without assistance use a cane how many minutes can you walk? 10 ability to climb steps?  yes do you drive?  yes  Function retired  Neuro/Psych trouble walking  Prior Studies Any changes since last visit?  no  Physicians involved in your care Any changes since last visit?  no   Family History  Problem Relation Age of Onset  . Stroke Mother   . Heart attack Father   . Diabetes Brother   . Heart disease Brother    Social History   Socioeconomic History  . Marital status: Married    Spouse name: Not on file  . Number of children: Not on file  . Years of education: Not on file  . Highest education level: Not on file  Occupational History  . Not on file  Social Needs  . Financial resource strain: Not on file  . Food insecurity:    Worry: Not on file    Inability: Not on file  . Transportation needs:    Medical: Not on file    Non-medical: Not on file  Tobacco Use  . Smoking status: Former Smoker    Packs/day: 1.00    Years: 15.00    Pack years: 15.00  . Smokeless  tobacco: Never Used  . Tobacco comment: quit smoking in 1985  Substance and Sexual Activity  . Alcohol use: No    Alcohol/week: 6.0 standard drinks    Types: 6 Standard drinks or equivalent per week    Frequency: Never  . Drug use: No  . Sexual activity: Never    Partners: Male    Birth control/protection: Surgical    Comment: hysterectomy  Lifestyle  . Physical activity:    Days per week: Not on file    Minutes per session: Not on file  . Stress: Not on file  Relationships  . Social connections:    Talks on phone: Not on file    Gets together: Not on file    Attends religious service: Not on file    Active member of club or organization: Not on file    Attends meetings of clubs or organizations: Not on file    Relationship status: Not on file  Other Topics Concern  . Not on file  Social History Narrative  . Not on file   Past Surgical History:  Procedure Laterality Date  . ABDOMINAL HYSTERECTOMY  1982   BSO  . ANTERIOR CERVICAL DECOMP/DISCECTOMY FUSION N/A 10/03/2014   Procedure: ANTERIOR CERVICAL DECOMPRESSION/DISCECTOMY FUSION 1 LEVEL,CERVICAL  THREE-FOUR;  Surgeon: Jenna Miss, MD;  Location: [facility];  Service: Neurosurgery;  Laterality: N/A;  . APPENDECTOMY  1986   along with chole  . BREAST BIOPSY Right 1997   sclerosing adenosis -Dr. Lucia Gaskins  . CHOLECYSTECTOMY    . COLONOSCOPY    . ESOPHAGOGASTRODUODENOSCOPY     with dilitation  . LIPOMA EXCISION Left 10/1999   left thigh  . lumbosacral cyst  2005   seen on injection for low back pain   . LUNG BIOPSY  05/2001   /w Dr.Burney- bronchoscopy, mediastinoscopy  . REVERSE SHOULDER ARTHROPLASTY Left 02/01/2016   Procedure: LEFT REVERSE TOTAL SHOULDER ARTHROPLASTY;  Surgeon: [name], MD;  Location: [facility];  Service: Orthopedics;  Laterality: Left;  . rotator cuff  Bilateral 1996/1998   repair  . ROTATOR CUFF REPAIR Left 1996; 1981  . ROTATOR CUFF REPAIR Right 1996  . TONSILLECTOMY    . TOTAL HIP  ARTHROPLASTY Left 10/2000  . TOTAL HIP ARTHROPLASTY Right 06/2005  . TOTAL HIP ARTHROPLASTY Left    10/2000  . VAGINAL DELIVERY     x1   Past Medical History:  Diagnosis Date  . Asthmatic bronchitis   . Cataracts, bilateral    immature  . Chronic back pain    arthritis.Stenosis  . Dysphagia   . Dysrhythmia    (?or extra beats) treated /w atenolol  . Emphysema lung ([place])    left  . GERD (gastroesophageal reflux disease)    takes Omeprazole daily  . Hard of hearing    wears hearing aides   . History of blood transfusion    no abnormal reaction noted  . History of gastric ulcer   . Hypercholesteremia    takes Atorvastatin daily  . Hypertension    takes Coreg and Losartan daily  . Hypothyroidism    takes Synthroid daily  . IBS (irritable bowel syndrome)    takes Librax daily  . Joint pain   . Lumbosacral spondylosis without myelopathy   . Melanoma ([place])    on left leg, wide excision   . Muscle spasm    takes Robaxin daily as needed  . Neuromuscular disorder ([place])    esophageal spasms at times   . Osteoarthritis   . Peripheral edema    take HCTZ daily  . Pneumonia    hx of.Many yrs ago  . 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 Seasonal allergies    takes Allegra daily and uses Flonase daily as needed  . Shortness of breath dyspnea    with exertion  . Venous stasis   . Yeast infection    given Diflucan today   BP 125/84   Pulse (!) 109   Resp 14   Ht 5\' 3"  (1.6 m) Comment: reported  Wt 139 lb 6.4 oz (63.2 kg)   LMP 11/03/1980 (Approximate)   BMI 24.69 kg/m   Opioid Risk Score:   Fall Risk Score:  `1  Depression screen PHQ 2/9  Depression screen [facility] 2/9 09/13/2018 05/13/2018 04/08/2018 03/10/2018 11/23/2017 07/21/2017 06/30/2017  Decreased Interest 0 0 - 0 0 0 0  Down, Depressed, Hopeless 0 0 - 0 0 0 0  PHQ - 2 Score 0 0 - 0 0 0 0  Altered sleeping - - 0 - - - -  Tired, decreased energy - - - - - - -  Change  in appetite - - - - - - -  Feeling bad  or failure about yourself  - - - - - - -  Trouble concentrating - - - - - - -  Moving slowly or fidgety/restless - - - - - - -  Suicidal thoughts - - - - - - -  PHQ-9 Score - - - - - - -  Some recent data might be hidden     Review of Systems  Constitutional: Negative.   HENT: Negative.   Eyes: Negative.   Respiratory: Negative.   Cardiovascular: Negative.   Endocrine: Negative.   Genitourinary: Negative.   Musculoskeletal: Positive for gait problem.  Skin: Negative.   Allergic/Immunologic: Negative.   Hematological: Negative.   Psychiatric/Behavioral: Negative.   All other systems reviewed and are negative.      Objective:   Physical Exam  Constitutional: She is oriented to person, place, and time. She appears well-developed and well-nourished.  HENT:  Head: Normocephalic and atraumatic.  Neck: Normal range of motion. Neck supple.  Cervical Paraspinal Tenderness: C-5-C-6  Cardiovascular: Normal rate and regular rhythm.  Pulmonary/Chest: Effort normal and breath sounds normal.  Musculoskeletal:  Normal Muscle Bulk and Muscle Testing Reveals: Upper Extremities: Full ROM and Muscle Strength 5/5 Back without spinal tenderness noted Lower Extremities: Full ROM and Muscle Strength 5/5 Arises from Table with ease Narrow Based gait  Neurological: She is alert and oriented to person, place, and time.  Skin: Skin is warm and dry.  Psychiatric: She has a normal mood and affect. Her behavior is normal.  Nursing note and vitals reviewed.         Assessment & Plan:  1.Chronic low back pain withlumbar spondylosis:Refilled:  Oxycodone. 5mg  one tablet every 8 hours as needed for moderate pain #75. 09/13/2018 We will continue the opioid monitoring program,this consists of regular clinic visits, examinations, urine drug screen, pill counts as well as use of [place] Controlled Substance reporting System. 2. Lumbar Radiculitis: S/P  Right Lumbar  MBB on 05/28/18 with relief noted.Adverse Reaction with Gabapentin and Amitriptyline we willContinue to monitor.09/13/2018 3. Cervicalgia/Cervical Spondylosis/Cervical stenosis with radiculitis/ S/P Anterior Cervical Fusion C3-C4 with Dr. Ellene Route on 10/03/2014.3-level anterior decompression and arthrodesis per [facility] on hold.Continue to Monitor.Dr. Ellene Route Following. 09/13/2018 4. Chronic bilateral shoulder pain:No complaints today.Continue with Current medication regime and HEP as Tolerated. .09/13/2018 S/P Left Reverse Total Shoulder Arthroplasty on 02/01/16 via Dr. Veverly Fells. 5. Muscle Spasm:No complaints today.Continueto monitor.09/13/18  20 minutes of face to face patient care time was spent during this visit. All questions were encouraged and answered.  F/U in 1 month

## 2018-09-16 ENCOUNTER — Ambulatory Visit: Payer: Medicare Other | Admitting: Registered Nurse

## 2018-09-20 ENCOUNTER — Encounter: Payer: Medicare Other | Admitting: Registered Nurse

## 2018-10-07 ENCOUNTER — Encounter: Payer: Medicare Other | Attending: Physical Medicine and Rehabilitation | Admitting: Registered Nurse

## 2018-10-07 ENCOUNTER — Encounter: Payer: Self-pay | Admitting: Registered Nurse

## 2018-10-07 VITALS — BP 117/74 | HR 89 | Ht 62.5 in | Wt 140.0 lb

## 2018-10-07 DIAGNOSIS — M47817 Spondylosis without myelopathy or radiculopathy, lumbosacral region: Secondary | ICD-10-CM

## 2018-10-07 DIAGNOSIS — Z981 Arthrodesis status: Secondary | ICD-10-CM

## 2018-10-07 DIAGNOSIS — Z79899 Other long term (current) drug therapy: Secondary | ICD-10-CM | POA: Diagnosis present

## 2018-10-07 DIAGNOSIS — M47812 Spondylosis without myelopathy or radiculopathy, cervical region: Secondary | ICD-10-CM | POA: Diagnosis present

## 2018-10-07 DIAGNOSIS — Z79891 Long term (current) use of opiate analgesic: Secondary | ICD-10-CM

## 2018-10-07 DIAGNOSIS — M5416 Radiculopathy, lumbar region: Secondary | ICD-10-CM | POA: Diagnosis not present

## 2018-10-07 DIAGNOSIS — M12811 Other specific arthropathies, not elsewhere classified, right shoulder: Secondary | ICD-10-CM | POA: Diagnosis present

## 2018-10-07 DIAGNOSIS — G894 Chronic pain syndrome: Secondary | ICD-10-CM

## 2018-10-07 DIAGNOSIS — M12812 Other specific arthropathies, not elsewhere classified, left shoulder: Secondary | ICD-10-CM | POA: Diagnosis present

## 2018-10-07 DIAGNOSIS — M4712 Other spondylosis with myelopathy, cervical region: Secondary | ICD-10-CM | POA: Diagnosis not present

## 2018-10-07 DIAGNOSIS — Z5181 Encounter for therapeutic drug level monitoring: Secondary | ICD-10-CM | POA: Insufficient documentation

## 2018-10-07 DIAGNOSIS — M47816 Spondylosis without myelopathy or radiculopathy, lumbar region: Secondary | ICD-10-CM | POA: Insufficient documentation

## 2018-10-07 MED ORDER — OXYCODONE HCL 5 MG PO TABS: 5.0000 mg | ORAL_TABLET | Freq: Three times a day (TID) | ORAL | 0 refills | Status: DC | PRN

## 2018-10-07 NOTE — Progress Notes (Signed)
Subjective:    Patient ID: Jenna [name], female    DOB: [birth year], 84 y.o.   MRN: [id number]  HPI: Jenna [name] is a 83 y.o. female who returns for follow up appointment for chronic pain and medication refill. She states her pain is located in her lower back radiating into her right hip. She  rates her pain 3. Her current exercise regime is walking and performing stretching exercises.   [name] Morphine equivalent is 22.50  MME.  Last UDS was Performed on 08/17/2018, it was consistent.   Pain Inventory Average Pain 5 Pain Right Now 3 My pain is intermittent, sharp and aching  In the last 24 hours, has pain interfered with the following? General activity 6 Relation with others 3 Enjoyment of life 3 What TIME of day is your pain at its worst? evening Sleep (in general) Good  Pain is worse with: walking, standing and some activites Pain improves with: rest, pacing activities and medication Relief from Meds: 6  Mobility walk without assistance ability to climb steps?  yes do you drive?  yes  Function retired  Neuro/Psych weakness  Prior Studies Any changes since last visit?  no  Physicians involved in your care Any changes since last visit?  no   Family History  Problem Relation Age of Onset  . Stroke Mother   . Heart attack Father   . Diabetes Brother   . Heart disease Brother    Social History   Socioeconomic History  . Marital status: Married    Spouse name: Not on file  . Number of children: Not on file  . Years of education: Not on file  . Highest education level: Not on file  Occupational History  . Not on file  Social Needs  . Financial resource strain: Not on file  . Food insecurity:    Worry: Not on file    Inability: Not on file  . Transportation needs:    Medical: Not on file    Non-medical: Not on file  Tobacco Use  . Smoking status: Former Smoker    Packs/day: 1.00    Years: 15.00    Pack years: 15.00  . Smokeless tobacco:  Never Used  . Tobacco comment: quit smoking in 1985  Substance and Sexual Activity  . Alcohol use: No    Alcohol/week: 6.0 standard drinks    Types: 6 Standard drinks or equivalent per week    Frequency: Never  . Drug use: No  . Sexual activity: Never    Partners: Male    Birth control/protection: Surgical    Comment: hysterectomy  Lifestyle  . Physical activity:    Days per week: Not on file    Minutes per session: Not on file  . Stress: Not on file  Relationships  . Social connections:    Talks on phone: Not on file    Gets together: Not on file    Attends religious service: Not on file    Active member of club or organization: Not on file    Attends meetings of clubs or organizations: Not on file    Relationship status: Not on file  Other Topics Concern  . Not on file  Social History Narrative  . Not on file   Past Surgical History:  Procedure Laterality Date  . ABDOMINAL HYSTERECTOMY  1982   BSO  . ANTERIOR CERVICAL DECOMP/DISCECTOMY FUSION N/A 10/03/2014   Procedure: ANTERIOR CERVICAL DECOMPRESSION/DISCECTOMY FUSION 1 LEVEL,CERVICAL THREE-FOUR;  Surgeon: Mallie Mussel  Ellene Route, MD;  Location: [facility];  Service: Neurosurgery;  Laterality: N/A;  . APPENDECTOMY  1986   along with chole  . BREAST BIOPSY Right 1997   sclerosing adenosis -Dr. Lucia Gaskins  . CHOLECYSTECTOMY    . COLONOSCOPY    . ESOPHAGOGASTRODUODENOSCOPY     with dilitation  . LIPOMA EXCISION Left 10/1999   left thigh  . lumbosacral cyst  2005   seen on injection for low back pain   . LUNG BIOPSY  05/2001   /w Dr.Burney- bronchoscopy, mediastinoscopy  . REVERSE SHOULDER ARTHROPLASTY Left 02/01/2016   Procedure: LEFT REVERSE TOTAL SHOULDER ARTHROPLASTY;  Surgeon: [name], MD;  Location: [facility];  Service: Orthopedics;  Laterality: Left;  . rotator cuff  Bilateral 1996/1998   repair  . ROTATOR CUFF REPAIR Left 1996; 1981  . ROTATOR CUFF REPAIR Right 1996  . TONSILLECTOMY    . TOTAL HIP ARTHROPLASTY Left  10/2000  . TOTAL HIP ARTHROPLASTY Right 06/2005  . TOTAL HIP ARTHROPLASTY Left    10/2000  . VAGINAL DELIVERY     x1   Past Medical History:  Diagnosis Date  . Asthmatic bronchitis   . Cataracts, bilateral    immature  . Chronic back pain    arthritis.Stenosis  . Dysphagia   . Dysrhythmia    (?or extra beats) treated /w atenolol  . Emphysema lung ([place])    left  . GERD (gastroesophageal reflux disease)    takes Omeprazole daily  . Hard of hearing    wears hearing aides   . History of blood transfusion    no abnormal reaction noted  . History of gastric ulcer   . Hypercholesteremia    takes Atorvastatin daily  . Hypertension    takes Coreg and Losartan daily  . Hypothyroidism    takes Synthroid daily  . IBS (irritable bowel syndrome)    takes Librax daily  . Joint pain   . Lumbosacral spondylosis without myelopathy   . Melanoma ([place])    on left leg, wide excision   . Muscle spasm    takes Robaxin daily as needed  . Neuromuscular disorder ([place])    esophageal spasms at times   . Osteoarthritis   . Peripheral edema    take HCTZ daily  . Pneumonia    hx of.Many yrs ago  . 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 Seasonal allergies    takes Allegra daily and uses Flonase daily as needed  . Shortness of breath dyspnea    with exertion  . Venous stasis   . Yeast infection    given Diflucan today   BP 117/74   Pulse 89   Ht 5' 2.5" (1.588 m)   Wt 140 lb (63.5 kg)   LMP 11/03/1980 (Approximate)   SpO2 97%   BMI 25.20 kg/m   Opioid Risk Score:   Fall Risk Score:  `1  Depression screen PHQ 2/9  Depression screen [facility] 2/9 09/13/2018 05/13/2018 04/08/2018 03/10/2018 11/23/2017 07/21/2017 06/30/2017  Decreased Interest 0 0 - 0 0 0 0  Down, Depressed, Hopeless 0 0 - 0 0 0 0  PHQ - 2 Score 0 0 - 0 0 0 0  Altered sleeping - - 0 - - - -  Tired, decreased energy - - - - - - -  Change in appetite - - - - - - -  Feeling bad or  failure about yourself  - - - - - - -  Trouble concentrating - - - - - - -  Moving slowly or fidgety/restless - - - - - - -  Suicidal thoughts - - - - - - -  PHQ-9 Score - - - - - - -  Some recent data might be hidden     Review of Systems  Constitutional: Negative.   HENT: Negative.   Eyes: Negative.   Respiratory: Negative.   Cardiovascular: Negative.   Gastrointestinal: Negative.   Endocrine: Negative.   Genitourinary: Negative.   Musculoskeletal: Positive for arthralgias, back pain and myalgias.  Skin: Negative.   Allergic/Immunologic: Negative.   Neurological: Positive for weakness.  Hematological: Negative.   Psychiatric/Behavioral: Negative.   All other systems reviewed and are negative.      Objective:   Physical Exam  Constitutional: She is oriented to person, place, and time. She appears well-developed and well-nourished.  HENT:  Head: Normocephalic and atraumatic.  Neck: Normal range of motion. Neck supple.  Cardiovascular: Normal rate and regular rhythm.  Pulmonary/Chest: Effort normal and breath sounds normal.  Musculoskeletal:  Normal Muscle Bulk and Muscle Testing Reveals: Upper Extremities: Full ROM and Muscle Strength 5/5 Lumbar Paraspinal Tenderness: L-4-L-5 Lower Extremities: Full ROM and Muscle Strength 5/5 Arises from Table with Ease  Narrow Based Gait  Neurological: She is alert and oriented to person, place, and time.  Skin: Skin is warm and dry.  Psychiatric: She has a normal mood and affect. Her behavior is normal.  Nursing note and vitals reviewed.         Assessment & Plan:  1.Chronic low back pain withlumbar spondylosis:Refilled:  Oxycodone. 5mg  one tablet every 8 hours as needed  #75.We will continue the opioid monitoring program,this consists of regular clinic visits, examinations, urine drug screen, pill counts as well as use of [place] Controlled Substance reporting System. 2. Lumbar Radiculitis: S/PLumbar  Radiofrequency with relief noted.Adverse Reaction with Gabapentin and Amitriptyline we willContinue to monitor.10/07/2018 3. Cervicalgia/Cervical Spondylosis/Cervical stenosis with radiculitis/ S/P Anterior Cervical Fusion C3-C4 with Dr. Ellene Route on 10/03/2014.3-level anterior decompression and arthrodesis per [facility] on hold.Continue to Monitor.Dr. Ellene Route Following. 10/07/2018 4. Chronic bilateral shoulder pain:No complaints today.Continue with Current medication regime.10/07/2018 S/P Left Reverse Total Shoulder Arthroplasty on 02/01/16 via Dr. Veverly Fells. 5. Muscle Spasm:No complaints today.Continueto monitor.10/07/2018.  20 minutes of face to face patient care time was spent during this visit. All questions were encouraged and answered.  F/U in 1 month

## 2018-11-05 ENCOUNTER — Encounter: Payer: Self-pay | Admitting: Registered Nurse

## 2018-11-05 ENCOUNTER — Encounter: Payer: Medicare Other | Attending: Physical Medicine and Rehabilitation | Admitting: Registered Nurse

## 2018-11-05 ENCOUNTER — Other Ambulatory Visit: Payer: Self-pay

## 2018-11-05 VITALS — BP 118/76 | HR 96 | Ht 62.5 in | Wt 134.4 lb

## 2018-11-05 DIAGNOSIS — M47816 Spondylosis without myelopathy or radiculopathy, lumbar region: Secondary | ICD-10-CM | POA: Insufficient documentation

## 2018-11-05 DIAGNOSIS — M47812 Spondylosis without myelopathy or radiculopathy, cervical region: Secondary | ICD-10-CM | POA: Insufficient documentation

## 2018-11-05 DIAGNOSIS — Z981 Arthrodesis status: Secondary | ICD-10-CM

## 2018-11-05 DIAGNOSIS — M12811 Other specific arthropathies, not elsewhere classified, right shoulder: Secondary | ICD-10-CM | POA: Insufficient documentation

## 2018-11-05 DIAGNOSIS — Z79899 Other long term (current) drug therapy: Secondary | ICD-10-CM | POA: Diagnosis present

## 2018-11-05 DIAGNOSIS — Z5181 Encounter for therapeutic drug level monitoring: Secondary | ICD-10-CM | POA: Insufficient documentation

## 2018-11-05 DIAGNOSIS — M12812 Other specific arthropathies, not elsewhere classified, left shoulder: Secondary | ICD-10-CM | POA: Insufficient documentation

## 2018-11-05 DIAGNOSIS — M5416 Radiculopathy, lumbar region: Secondary | ICD-10-CM

## 2018-11-05 DIAGNOSIS — M4712 Other spondylosis with myelopathy, cervical region: Secondary | ICD-10-CM | POA: Diagnosis not present

## 2018-11-05 DIAGNOSIS — Z79891 Long term (current) use of opiate analgesic: Secondary | ICD-10-CM

## 2018-11-05 DIAGNOSIS — M47817 Spondylosis without myelopathy or radiculopathy, lumbosacral region: Secondary | ICD-10-CM | POA: Diagnosis not present

## 2018-11-05 DIAGNOSIS — G894 Chronic pain syndrome: Secondary | ICD-10-CM

## 2018-11-05 MED ORDER — OXYCODONE HCL 5 MG PO TABS: 5.0000 mg | ORAL_TABLET | Freq: Three times a day (TID) | ORAL | 0 refills | Status: DC | PRN

## 2018-11-05 NOTE — Progress Notes (Signed)
Subjective:    Patient ID: Jenna [name], female    DOB: [birth year], 85 y.o.   MRN: [id number]  HPI: Jenna [name] is a 85 y.o. female who returns for follow up appointment for chronic pain and medication refill. She states her pain is located in lower back radiating into her bilateral lower extremities occassionally. She rates her  Pain 3. Her current exercise regime is walking and performing stretching exercises.  Ms. [place] Morphine equivalent is 22.50 MME.  Last UDS was Performed on 08/17/2018, it was consistent.   Pain Inventory Average Pain 5 Pain Right Now 3 My pain is intermittent, sharp and aching  In the last 24 hours, has pain interfered with the following? General activity 5 Relation with others 3 Enjoyment of life 4 What TIME of day is your pain at its worst? daytime and evening Sleep (in general) Good  Pain is worse with: standing and some activites Pain improves with: rest, pacing activities and medication Relief from Meds: 6  Mobility walk without assistance how many minutes can you walk? 10 ability to climb steps?  yes do you drive?  yes  Function retired  Neuro/Psych weakness  Prior Studies Any changes since last visit?  no  Physicians involved in your care Any changes since last visit?  no   Family History  Problem Relation Age of Onset  . Stroke Mother   . Heart attack Father   . Diabetes Brother   . Heart disease Brother    Social History   Socioeconomic History  . Marital status: Married    Spouse name: Not on file  . Number of children: Not on file  . Years of education: Not on file  . Highest education level: Not on file  Occupational History  . Not on file  Social Needs  . Financial resource strain: Not on file  . Food insecurity:    Worry: Not on file    Inability: Not on file  . Transportation needs:    Medical: Not on file    Non-medical: Not on file  Tobacco Use  . Smoking status: Former Smoker    Packs/day: 1.00     Years: 15.00    Pack years: 15.00  . Smokeless tobacco: Never Used  . Tobacco comment: quit smoking in 1985  Substance and Sexual Activity  . Alcohol use: No    Alcohol/week: 6.0 standard drinks    Types: 6 Standard drinks or equivalent per week    Frequency: Never  . Drug use: No  . Sexual activity: Never    Partners: Male    Birth control/protection: Surgical    Comment: hysterectomy  Lifestyle  . Physical activity:    Days per week: Not on file    Minutes per session: Not on file  . Stress: Not on file  Relationships  . Social connections:    Talks on phone: Not on file    Gets together: Not on file    Attends religious service: Not on file    Active member of club or organization: Not on file    Attends meetings of clubs or organizations: Not on file    Relationship status: Not on file  Other Topics Concern  . Not on file  Social History Narrative  . Not on file   Past Surgical History:  Procedure Laterality Date  . ABDOMINAL HYSTERECTOMY  1982   BSO  . ANTERIOR CERVICAL DECOMP/DISCECTOMY FUSION N/A 10/03/2014   Procedure: ANTERIOR CERVICAL DECOMPRESSION/DISCECTOMY  FUSION 1 LEVEL,CERVICAL THREE-FOUR;  Surgeon: [name], MD;  Location: [place];  Service: Neurosurgery;  Laterality: N/A;  . APPENDECTOMY  1986   along with chole  . BREAST BIOPSY Right 1997   sclerosing adenosis -Dr. Lucia Gaskins  . CHOLECYSTECTOMY    . COLONOSCOPY    . ESOPHAGOGASTRODUODENOSCOPY     with dilitation  . LIPOMA EXCISION Left 10/1999   left thigh  . lumbosacral cyst  2005   seen on injection for low back pain   . LUNG BIOPSY  05/2001   /w Dr.Burney- bronchoscopy, mediastinoscopy  . REVERSE SHOULDER ARTHROPLASTY Left 02/01/2016   Procedure: LEFT REVERSE TOTAL SHOULDER ARTHROPLASTY;  Surgeon: [name], MD;  Location: [facility];  Service: Orthopedics;  Laterality: Left;  . rotator cuff  Bilateral 1996/1998   repair  . ROTATOR CUFF REPAIR Left 1996; 1981  . ROTATOR CUFF REPAIR  Right 1996  . TONSILLECTOMY    . TOTAL HIP ARTHROPLASTY Left 10/2000  . TOTAL HIP ARTHROPLASTY Right 06/2005  . TOTAL HIP ARTHROPLASTY Left    10/2000  . VAGINAL DELIVERY     x1   Past Medical History:  Diagnosis Date  . Asthmatic bronchitis   . Cataracts, bilateral    immature  . Chronic back pain    arthritis.Stenosis  . Dysphagia   . Dysrhythmia    (?or extra beats) treated /w atenolol  . Emphysema lung ([place])    left  . GERD (gastroesophageal reflux disease)    takes Omeprazole daily  . Hard of hearing    wears hearing aides   . History of blood transfusion    no abnormal reaction noted  . History of gastric ulcer   . Hypercholesteremia    takes Atorvastatin daily  . Hypertension    takes Coreg and Losartan daily  . Hypothyroidism    takes Synthroid daily  . IBS (irritable bowel syndrome)    takes Librax daily  . Joint pain   . Lumbosacral spondylosis without myelopathy   . Melanoma ([place])    on left leg, wide excision   . Muscle spasm    takes Robaxin daily as needed  . Neuromuscular disorder ([place])    esophageal spasms at times   . Osteoarthritis   . Peripheral edema    take HCTZ daily  . Pneumonia    hx of.Many yrs ago  . 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 Seasonal allergies    takes Allegra daily and uses Flonase daily as needed  . Shortness of breath dyspnea    with exertion  . Venous stasis   . Yeast infection    given Diflucan today   LMP 11/03/1980 (Approximate)   Opioid Risk Score:   Fall Risk Score:  `1  Depression screen PHQ 2/9  Depression screen [facility] 2/9 09/13/2018 05/13/2018 04/08/2018 03/10/2018 11/23/2017 07/21/2017 06/30/2017  Decreased Interest 0 0 - 0 0 0 0  Down, Depressed, Hopeless 0 0 - 0 0 0 0  PHQ - 2 Score 0 0 - 0 0 0 0  Altered sleeping - - 0 - - - -  Tired, decreased energy - - - - - - -  Change in appetite - - - - - - -  Feeling bad or failure about yourself  - - - - - - -    Trouble concentrating - - - - - - -  Moving slowly or fidgety/restless - - - - - - -  Suicidal thoughts - - - - - - -  PHQ-9 Score - - - - - - -  Some recent data might be hidden    Review of Systems  Constitutional: Negative.   HENT: Negative.   Eyes: Negative.   Respiratory: Negative.   Cardiovascular: Negative.   Gastrointestinal: Negative.   Endocrine: Negative.   Genitourinary: Negative.   Musculoskeletal: Negative.   Skin: Negative.   Allergic/Immunologic: Negative.   Neurological: Positive for weakness.  Hematological: Negative.   Psychiatric/Behavioral: Negative.   All other systems reviewed and are negative.      Objective:   Physical Exam Vitals signs and nursing note reviewed.  Constitutional:      Appearance: Normal appearance.  Neck:     Musculoskeletal: Normal range of motion and neck supple.  Cardiovascular:     Rate and Rhythm: Normal rate and regular rhythm.     Pulses: Normal pulses.     Heart sounds: Normal heart sounds.  Pulmonary:     Effort: Pulmonary effort is normal.     Breath sounds: Normal breath sounds.  Musculoskeletal:     Comments: Normal Muscle Bulk and Muscle Testing Reveals:  Upper Extremities:Full  ROM and Muscle Strength 5/5  Lumbar Paraspinal Tenderness: L-4-L-5 Lower Extremities: Full ROM and Muscle Strength 5/5 Arises from table with ease Narrow Based  Gait   Skin:    General: Skin is warm and dry.  Neurological:     Mental Status: She is alert and oriented to person, place, and time.  Psychiatric:        Mood and Affect: Mood normal.        Behavior: Behavior normal.           Assessment & Plan:  1.Chronic low back pain withlumbar spondylosis:Refilled:  Oxycodone. 5mg  one tablet every 8 hours as needed  #75.11/05/2018 We will continue the opioid monitoring program,this consists of regular clinic visits, examinations, urine drug screen, pill counts as well as use of [place] Controlled Substance reporting  System. 2. Lumbar Radiculitis: S/PLumbar Radiofrequency with relief noted.Adverse Reaction with Gabapentin and Amitriptyline we willContinue to monitor.11/05/2018 3. Cervicalgia/Cervical Spondylosis/Cervical stenosis with radiculitis/ S/P Anterior Cervical Fusion C3-C4 with Dr. Ellene Route on 10/03/2014.3-level anterior decompression and arthrodesis per [facility] on hold.Continue to Monitor.Dr. Ellene Route Following. 11/05/2018 4. Chronic bilateral shoulder pain:No complaints today.Continue with Current medication regime.11/05/2018 S/P Left Reverse Total Shoulder Arthroplasty on 02/01/16 via Dr. Veverly Fells. 5. Muscle Spasm:No complaints today.Continueto monitor.11/05/2018.  20 minutes of face to face patient care time was spent during this visit. All questions were encouraged and answered.  F/U in 1 month

## 2018-11-11 MED FILL — ELIQUIS 5 MG TABLET: 5 | 30 days supply | Qty: 60 | Fill #0

## 2018-11-19 ENCOUNTER — Telehealth: Payer: Self-pay | Admitting: Certified Nurse Midwife

## 2018-11-19 NOTE — Telephone Encounter (Signed)
Patient is calling with a "cyst near her vulva".

## 2018-11-19 NOTE — Telephone Encounter (Signed)
Spoke with patient. Patient reports lump on left side of vulva. Noticed 4 days days ago when wiping. Only notices when wiping. Denies pain, vaginal d/c, odor, or bleeding. Can not visualize lump.   Recommended OV with physician for further evaluation, OV scheduled with Dr. Quincy Simmonds on 1/22 at 2pm. Patient declined OV on 1/20. Advised patient to return call to office if any new symptoms develop.  Routing to provider for final review. Patient is agreeable to disposition. Will close encounter.  Cc: Melvia Heaps, CNM

## 2018-11-22 NOTE — Progress Notes (Signed)
GYNECOLOGY  VISIT   HPI: 85 y.o.   Married  Caucasian  female   G1P1001 with Patient's last menstrual period was 11/03/1980 (approximate).   here for left vulvar lesion.     10 - 12 days ago, noticed a polyp like area on the vulva.  Now it is tender to touch and wipe.  Now spot of blood on the tissue with wiping.  No real drainage.   No fever or ill feeling.   Did have a respiratory infection recently and was tx with Levaquin and then switched to Z-Pack.  Did get a yeast infection in her mouth.  Did tx with magic mouthwash.  Also took Diflucan.   Recent dx of afib.   Does have a hx of rectal fistula.   GYNECOLOGIC HISTORY: Patient's last menstrual period was 11/03/1980 (approximate). Contraception:  Hysterectomy Menopausal hormone therapy:  none Last mammogram: 07-27-18 3D Neg/denstiy B/BiRads1 Last pap smear:   2002 Neg        OB History    Gravida  1   Para  1   Term  1   Preterm  0   AB  0   Living  1     SAB  0   TAB  0   Ectopic  0   Multiple  0   Live Births  1              Patient Active Problem List   Diagnosis Date Noted  . Lumbosacral spondylosis without myelopathy 01/23/2017  . S/P shoulder replacement 02/01/2016  . Cervical spondylosis with myelopathy 10/03/2014  . Spondylosis of cervical joint 07/07/2012  . Chronic postoperative pain 02/10/2012  . Rotator cuff tear arthropathy of both shoulders 02/10/2012  . Lumbar spondylosis 02/10/2012    Past Medical History:  Diagnosis Date  . Asthmatic bronchitis   . Atrial fibrillation ([place]) 2019  . Cataracts, bilateral    immature  . Chronic back pain    arthritis.Stenosis  . Dysphagia   . Dysrhythmia    (?or extra beats) treated /w atenolol  . Emphysema lung ([place])    left  . GERD (gastroesophageal reflux disease)    takes Omeprazole daily  . Hard of hearing    wears hearing aides   . History of blood transfusion    no abnormal reaction noted  . History of gastric ulcer   .  Hypercholesteremia    takes Atorvastatin daily  . Hypertension    takes Coreg and Losartan daily  . Hypothyroidism    takes Synthroid daily  . IBS (irritable bowel syndrome)    takes Librax daily  . Joint pain   . Lumbosacral spondylosis without myelopathy   . Melanoma ([place])    on left leg, wide excision   . Muscle spasm    takes Robaxin daily as needed  . Neuromuscular disorder ([place])    esophageal spasms at times   . Osteoarthritis   . Peripheral edema    take HCTZ daily  . Pneumonia    hx of.Many yrs ago  . 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 Seasonal allergies    takes Allegra daily and uses Flonase daily as needed  . Shortness of breath dyspnea    with exertion  . Venous stasis   . Yeast infection    given Diflucan today    Past Surgical History:  Procedure Laterality Date  . ABDOMINAL HYSTERECTOMY  1982  BSO  . ANTERIOR CERVICAL DECOMP/DISCECTOMY FUSION N/A 10/03/2014   Procedure: ANTERIOR CERVICAL DECOMPRESSION/DISCECTOMY FUSION 1 LEVEL,CERVICAL THREE-FOUR;  Surgeon: [name], MD;  Location: [facility];  Service: Neurosurgery;  Laterality: N/A;  . APPENDECTOMY  1986   along with chole  . BREAST BIOPSY Right 1997   sclerosing adenosis -Dr. Lucia Gaskins  . CHOLECYSTECTOMY    . COLONOSCOPY    . ESOPHAGOGASTRODUODENOSCOPY     with dilitation  . LIPOMA EXCISION Left 10/1999   left thigh  . lumbosacral cyst  2005   seen on injection for low back pain   . LUNG BIOPSY  05/2001   /w Dr.Burney- bronchoscopy, mediastinoscopy  . REVERSE SHOULDER ARTHROPLASTY Left 02/01/2016   Procedure: LEFT REVERSE TOTAL SHOULDER ARTHROPLASTY;  Surgeon: [name], MD;  Location: [facility];  Service: Orthopedics;  Laterality: Left;  . rotator cuff  Bilateral 1996/1998   repair  . ROTATOR CUFF REPAIR Left 1996; 1981  . ROTATOR CUFF REPAIR Right 1996  . TONSILLECTOMY    . TOTAL HIP ARTHROPLASTY Left 10/2000  . TOTAL HIP ARTHROPLASTY  Right 06/2005  . TOTAL HIP ARTHROPLASTY Left    10/2000  . VAGINAL DELIVERY     x1    Current Outpatient Medications  Medication Sig Dispense Refill  . albuterol (PROAIR HFA) 108 (90 Base) MCG/ACT inhaler Inhale 2 puffs into the lungs 4 (four) times daily.    [name] albuterol (PROVENTIL HFA;VENTOLIN HFA) 108 (90 Base) MCG/ACT inhaler Inhale 2 puffs into the lungs every 6 (six) hours as needed for wheezing or shortness of breath.    [name] amLODipine (NORVASC) 2.5 MG tablet Take 1 tablet by mouth daily.  0  . atorvastatin (LIPITOR) 80 MG tablet Take 40 mg by mouth daily.    . calcium carbonate (OS-CAL) 600 MG TABS Take 600 mg by mouth 2 (two) times daily.    . carvedilol (COREG) 12.5 MG tablet Take 12.5 mg by mouth 2 (two) times daily with a meal.    . cholecalciferol (VITAMIN D) 1000 UNITS tablet Take 1,000 Units by mouth daily.    . clindamycin (CLEOCIN) 150 MG capsule   2  . diazepam (VALIUM) 5 MG tablet Take 2.5 mg by mouth at bedtime as needed for sedation.     . diclofenac sodium (VOLTAREN) 1 % GEL Apply 2 g topically 4 (four) times daily. 3 Tube 1  . ELIQUIS 5 MG TABS tablet Take 1 tablet by mouth daily.    [name] estradiol (CLIMARA) 0.025 mg/24hr patch Place 1 patch (0.025 mg total) onto the skin once a week. 4 patch 6  . fexofenadine (ALLEGRA) 180 MG tablet Take 180 mg by mouth daily.    . fluticasone (FLONASE) 50 MCG/ACT nasal spray Place 2 sprays into both nostrils daily as needed for allergies.     . furosemide (LASIX) 20 MG tablet Take 20 mg by mouth.    . hyoscyamine (LEVSIN SL) 0.125 MG SL tablet Place 0.125-0.25 mg under the tongue every 4 (four) hours as needed for cramping.     [name] levothyroxine (SYNTHROID, LEVOTHROID) 112 MCG tablet Take 112 mcg by mouth daily before breakfast.    . losartan (COZAAR) 100 MG tablet Take 1 tablet (100 mg total) by mouth daily. 30 tablet 0  . methocarbamol (ROBAXIN) 500 MG tablet Take 1 tablet (500 mg total) by mouth every 6 (six) hours as needed for muscle  spasms. 60 tablet 2  . naphazoline (NAPHCON) 0.1 % ophthalmic solution Place 1 drop into both  eyes daily as needed for irritation.    [name] omeprazole (PRILOSEC) 40 MG capsule Take 40 mg by mouth 2 (two) times daily.     [name] oxyCODONE (OXY IR/ROXICODONE) 5 MG immediate release tablet Take 1 tablet (5 mg total) by mouth every 8 (eight) hours as needed for moderate pain. 70 tablet 0  . promethazine (PHENERGAN) 25 MG tablet Take 1 tablet (25 mg total) by mouth 2 (two) times daily as needed. 60 tablet 6  . sulfamethoxazole-trimethoprim (BACTRIM DS) 800-160 MG tablet Take 1 tablet by mouth 2 (two) times daily. Take for one week. 14 tablet 0   No current facility-administered medications for this visit.      ALLERGIES: Codeine; Pseudoephedrine hcl; Tylenol [acetaminophen]; Ultram [tramadol]; Vicodin [hydrocodone-acetaminophen]; [place] [flurazepam hcl]; Flurazepam; Nortriptyline; Cephalexin; Clarithromycin; Levofloxacin; and Vibramycin [doxycycline calcium]  Family History  Problem Relation Age of Onset  . Stroke Mother   . Heart attack Father   . Diabetes Brother   . Heart disease Brother     Social History   Socioeconomic History  . Marital status: Married    Spouse name: Not on file  . Number of children: Not on file  . Years of education: Not on file  . Highest education level: Not on file  Occupational History  . Not on file  Social Needs  . Financial resource strain: Not on file  . Food insecurity:    Worry: Not on file    Inability: Not on file  . Transportation needs:    Medical: Not on file    Non-medical: Not on file  Tobacco Use  . Smoking status: Former Smoker    Packs/day: 1.00    Years: 15.00    Pack years: 15.00  . Smokeless tobacco: Never Used  . Tobacco comment: quit smoking in 1985  Substance and Sexual Activity  . Alcohol use: No    Alcohol/week: 6.0 standard drinks    Types: 6 Standard drinks or equivalent per week    Frequency: Never  . Drug use: No  .  Sexual activity: Never    Partners: Male    Birth control/protection: Surgical    Comment: hysterectomy  Lifestyle  . Physical activity:    Days per week: Not on file    Minutes per session: Not on file  . Stress: Not on file  Relationships  . Social connections:    Talks on phone: Not on file    Gets together: Not on file    Attends religious service: Not on file    Active member of club or organization: Not on file    Attends meetings of clubs or organizations: Not on file    Relationship status: Not on file  . Intimate partner violence:    Fear of current or ex partner: Not on file    Emotionally abused: Not on file    Physically abused: Not on file    Forced sexual activity: Not on file  Other Topics Concern  . Not on file  Social History Narrative  . Not on file    Review of Systems  All other systems reviewed and are negative.   PHYSICAL EXAMINATION:    BP 110/72 (BP Location: Right Arm, Patient Position: Sitting, Cuff Size: Normal)   Pulse 88 Comment: irregular  Ht 5' 1.25" (1.556 m)   Wt 133 lb (60.3 kg)   LMP 11/03/1980 (Approximate)   BMI 24.93 kg/m     General appearance: alert, cooperative and appears stated age  Pelvic: External genitalia:   Left labia majora with 7 mm area of raised erythema and central area of pus like drainage.               Urethra:  normal appearing urethra with no masses, tenderness or lesions              Bartholins and Skenes: normal                 Vagina: normal appearing vagina with normal color and discharge, no lesions              Cervix: no lesions                Bimanual Exam:  Uterus:  normal size, contour, position, consistency, mobility, non-tender              Adnexa: no mass, fullness, tenderness            Chaperone was present for exam.  ASSESSMENT  Small vulvar abscess.  Possible infected sebaceous cyst.   PLAN  Warm wet soaks.  Bactrim DS po bid x 7 days.  We discussed signs and symptoms of C diff  infection due to her multiple recent abs.  Start probiotics. Recheck in 10 days.    An After Visit Summary was printed and given to the patient.  ___15___ minutes face to face time of which over 50% was spent in counseling.

## 2018-11-24 ENCOUNTER — Ambulatory Visit: Payer: Medicare Other | Admitting: Obstetrics and Gynecology

## 2018-11-24 ENCOUNTER — Other Ambulatory Visit: Payer: Self-pay

## 2018-11-24 ENCOUNTER — Encounter: Payer: Self-pay | Admitting: Obstetrics and Gynecology

## 2018-11-24 VITALS — BP 110/72 | HR 88 | Ht 61.25 in | Wt 133.0 lb

## 2018-11-24 DIAGNOSIS — N764 Abscess of vulva: Secondary | ICD-10-CM | POA: Diagnosis not present

## 2018-11-24 MED ORDER — SULFAMETHOXAZOLE-TRIMETHOPRIM 800-160 MG PO TABS: 1.0000 | ORAL_TABLET | Freq: Two times a day (BID) | ORAL | 0 refills | Status: DC

## 2018-11-24 NOTE — Patient Instructions (Signed)
Epidermal Cyst    An epidermal cyst is a sac made of skin tissue. The sac contains a substance called keratin. Keratin is a protein that is normally secreted through the hair follicles. When keratin becomes trapped in the top layer of skin (epidermis), it can form an epidermal cyst.  Epidermal cysts can be found anywhere on your body. These cysts are usually harmless (benign), and they may not cause symptoms unless they become infected.  What are the causes?  This condition may be caused by:   A blocked hair follicle.   A hair that curls and re-enters the skin instead of growing straight out of the skin (ingrown hair).   A blocked pore.   Irritated skin.   An injury to the skin.   Certain conditions that are passed along from parent to child (inherited).   Human papillomavirus (HPV).   Long-term (chronic) sun damage to the skin.  What increases the risk?  The following factors may make you more likely to develop an epidermal cyst:   Having acne.   Being overweight.   Being 30-40 years old.  What are the signs or symptoms?  The only symptom of this condition may be a small, painless lump underneath the skin. When an epidermal cyst ruptures, it may become infected. Symptoms may include:   Redness.   Inflammation.   Tenderness.   Warmth.   Fever.   Keratin draining from the cyst. Keratin is grayish-white, bad-smelling substance.   Pus draining from the cyst.  How is this diagnosed?  This condition is diagnosed with a physical exam.   In some cases, you may have a sample of tissue (biopsy) taken from your cyst to be examined under a microscope or tested for bacteria.   You may be referred to a health care provider who specializes in skin care (dermatologist).  How is this treated?  In many cases, epidermal cysts go away on their own without treatment. If a cyst becomes infected, treatment may include:   Opening and draining the cyst, done by a health care provider. After draining, minor surgery to  remove the rest of the cyst may be done.   Antibiotic medicine.   Injections of medicines (steroids) that help to reduce inflammation.   Surgery to remove the cyst. Surgery may be done if the cyst:  ? Becomes large.  ? Bothers you.  ? Has a chance of turning into cancer.   Do not try to open a cyst yourself.  Follow these instructions at home:   Take over-the-counter and prescription medicines only as told by your health care provider.   If you were prescribed an antibiotic medicine, take it it as told by your health care provider. Do not stop using the antibiotic even if you start to feel better.   Keep the area around your cyst clean and dry.   Wear loose, dry clothing.   Avoid touching your cyst.   Check your cyst every day for signs of infection. Check for:  ? Redness, swelling, or pain.  ? Fluid or blood.  ? Warmth.  ? Pus or a bad smell.   Keep all follow-up visits as told by your health care provider. This is important.  How is this prevented?   Wear clean, dry, clothing.   Avoid wearing tight clothing.   Keep your skin clean and dry. Take showers or baths every day.  Contact a health care provider if:   Your cyst develops symptoms of infection.     Your condition is not improving or is getting worse.   You develop a cyst that looks different from other cysts you have had.   You have a fever.  Get help right away if:   Redness spreads from the cyst into the surrounding area.  Summary   An epidermal cyst is a sac made of skin tissue. These cysts are usually harmless (benign), and they may not cause symptoms unless they become infected.   If a cyst becomes infected, treatment may include surgery to open and drain the cyst, or to remove it. Treatment may also include medicines by mouth or through an injection.   Take over-the-counter and prescription medicines only as told by your health care provider. If you were prescribed an antibiotic medicine, take it as told by your health care  provider. Do not stop using the antibiotic even if you start to feel better.   Contact a health care provider if your condition is not improving or is getting worse.   Keep all follow-up visits as told by your health care provider. This is important.  This information is not intended to replace advice given to you by your health care provider. Make sure you discuss any questions you have with your health care provider.  Document Released: 09/20/2004 Document Revised: 05/03/2018 Document Reviewed: 05/03/2018  Elsevier Interactive Patient Education  2019 [employer]

## 2018-12-06 ENCOUNTER — Telehealth: Payer: Self-pay | Admitting: Obstetrics and Gynecology

## 2018-12-06 ENCOUNTER — Ambulatory Visit: Payer: Medicare Other | Admitting: Physical Medicine & Rehabilitation

## 2018-12-06 NOTE — Telephone Encounter (Signed)
Encounter reviewed and closed.  

## 2018-12-06 NOTE — Telephone Encounter (Signed)
Patient called and cancelled her 10 day recheck with Dr. Quincy Simmonds on 12/08/18. She said she is doing much better and doesn't need the appointment. Routing to provider to review and close encounter unless follow up is needed.

## 2018-12-07 ENCOUNTER — Ambulatory Visit: Payer: Medicare Other | Admitting: Physical Medicine & Rehabilitation

## 2018-12-08 ENCOUNTER — Ambulatory Visit: Payer: Medicare Other | Admitting: Obstetrics and Gynecology

## 2018-12-14 ENCOUNTER — Encounter: Payer: Medicare Other | Attending: Physical Medicine and Rehabilitation | Admitting: Registered Nurse

## 2018-12-14 ENCOUNTER — Ambulatory Visit: Payer: Medicare Other | Admitting: Physical Medicine & Rehabilitation

## 2018-12-14 ENCOUNTER — Other Ambulatory Visit: Payer: Self-pay

## 2018-12-14 VITALS — BP 111/71 | HR 83 | Ht 62.0 in | Wt 133.6 lb

## 2018-12-14 DIAGNOSIS — M47817 Spondylosis without myelopathy or radiculopathy, lumbosacral region: Secondary | ICD-10-CM

## 2018-12-14 DIAGNOSIS — Z5181 Encounter for therapeutic drug level monitoring: Secondary | ICD-10-CM | POA: Insufficient documentation

## 2018-12-14 DIAGNOSIS — Z79899 Other long term (current) drug therapy: Secondary | ICD-10-CM | POA: Diagnosis present

## 2018-12-14 DIAGNOSIS — Z981 Arthrodesis status: Secondary | ICD-10-CM | POA: Diagnosis not present

## 2018-12-14 DIAGNOSIS — M47816 Spondylosis without myelopathy or radiculopathy, lumbar region: Secondary | ICD-10-CM | POA: Insufficient documentation

## 2018-12-14 DIAGNOSIS — M12812 Other specific arthropathies, not elsewhere classified, left shoulder: Secondary | ICD-10-CM | POA: Insufficient documentation

## 2018-12-14 DIAGNOSIS — M47812 Spondylosis without myelopathy or radiculopathy, cervical region: Secondary | ICD-10-CM | POA: Insufficient documentation

## 2018-12-14 DIAGNOSIS — M4712 Other spondylosis with myelopathy, cervical region: Secondary | ICD-10-CM | POA: Diagnosis not present

## 2018-12-14 DIAGNOSIS — M12811 Other specific arthropathies, not elsewhere classified, right shoulder: Secondary | ICD-10-CM | POA: Insufficient documentation

## 2018-12-14 DIAGNOSIS — Z79891 Long term (current) use of opiate analgesic: Secondary | ICD-10-CM

## 2018-12-14 DIAGNOSIS — G894 Chronic pain syndrome: Secondary | ICD-10-CM

## 2018-12-14 DIAGNOSIS — M5416 Radiculopathy, lumbar region: Secondary | ICD-10-CM

## 2018-12-14 NOTE — Patient Instructions (Signed)
Call office or send My Chart Message  in 10 days regarding medication Management.

## 2018-12-14 NOTE — Progress Notes (Signed)
Subjective:    Patient ID: Jenna [name], female    DOB: 13-Sep-1933, 85 y.o.   MRN: [id number]  HPI: Jenna [name] is a 85 y.o. female who returns for follow up appointment for chronic pain and medication refill. She states her pain is located in her mid- lower back. She rates her pain 6. Her current exercise regime is walking and performing stretching exercises.  Ms. Cotugno reports at times she develops anxiety prior to taking her oxycodone. We discussed treatment modalities as it relates to her allergies, she verbalizes understanding. She is prescribed diazepam by her PCP, we discuss when to take her diazepam, she verbalizes understanding. Also instructed to call office in a week with her decision to continue with oxycodone, she verbalizes understanding.    Ms. Haworth reports she had electrodessication to her right lower extremity, dermatology following.   Ms. Eilers also states she was diagnosed with Atrial Fibrillation, she was prescribed Eliquis and cardiology following.   Ms. Magno Morphine equivalent is 22.83  MME. She is also prescribed Dr. Mancel Bale .We have discussed the black box warning of using opioids and benzodiazepines. I highlighted the dangers of using these drugs together and discussed the adverse events including respiratory suppression, overdose, cognitive impairment and importance of compliance with current regimen. We will continue to monitor and adjust as indicated.   Last UDS was Performed on 08/25/2018, it was consistent.  Pain Inventory Average Pain 6 Pain Right Now 6 My pain is intermittent and aching  In the last 24 hours, has pain interfered with the following? General activity 5 Relation with others 3 Enjoyment of life 4 What TIME of day is your pain at its worst? daytime evening Sleep (in general) Fair  Pain is worse with: standing and some activites Pain improves with: rest, pacing activities and medication Relief from Meds: 5  Mobility walk without  assistance how many minutes can you walk? 10 ability to climb steps?  yes do you drive?  yes  Function retired  Neuro/Psych weakness  Prior Studies Any changes since last visit?  no  Physicians involved in your care Any changes since last visit?  no   Family History  Problem Relation Age of Onset  . Stroke Mother   . Heart attack Father   . Diabetes Brother   . Heart disease Brother    Social History   Socioeconomic History  . Marital status: Married    Spouse name: Not on file  . Number of children: Not on file  . Years of education: Not on file  . Highest education level: Not on file  Occupational History  . Not on file  Social Needs  . Financial resource strain: Not on file  . Food insecurity:    Worry: Not on file    Inability: Not on file  . Transportation needs:    Medical: Not on file    Non-medical: Not on file  Tobacco Use  . Smoking status: Former Smoker    Packs/day: 1.00    Years: 15.00    Pack years: 15.00  . Smokeless tobacco: Never Used  . Tobacco comment: quit smoking in 1985  Substance and Sexual Activity  . Alcohol use: No    Alcohol/week: 6.0 standard drinks    Types: 6 Standard drinks or equivalent per week    Frequency: Never  . Drug use: No  . Sexual activity: Never    Partners: Male    Birth control/protection: Surgical    Comment:  hysterectomy  Lifestyle  . Physical activity:    Days per week: Not on file    Minutes per session: Not on file  . Stress: Not on file  Relationships  . Social connections:    Talks on phone: Not on file    Gets together: Not on file    Attends religious service: Not on file    Active member of club or organization: Not on file    Attends meetings of clubs or organizations: Not on file    Relationship status: Not on file  Other Topics Concern  . Not on file  Social History Narrative  . Not on file   Past Surgical History:  Procedure Laterality Date  . ABDOMINAL HYSTERECTOMY  1982   BSO    . ANTERIOR CERVICAL DECOMP/DISCECTOMY FUSION N/A 10/03/2014   Procedure: ANTERIOR CERVICAL DECOMPRESSION/DISCECTOMY FUSION 1 LEVEL,CERVICAL THREE-FOUR;  Surgeon: [name], MD;  Location: [facility];  Service: Neurosurgery;  Laterality: N/A;  . APPENDECTOMY  1986   along with chole  . BREAST BIOPSY Right 1997   sclerosing adenosis -Dr. Lucia Gaskins  . CHOLECYSTECTOMY    . COLONOSCOPY    . ESOPHAGOGASTRODUODENOSCOPY     with dilitation  . LIPOMA EXCISION Left 10/1999   left thigh  . lumbosacral cyst  2005   seen on injection for low back pain   . LUNG BIOPSY  05/2001   /w Dr.Burney- bronchoscopy, mediastinoscopy  . REVERSE SHOULDER ARTHROPLASTY Left 02/01/2016   Procedure: LEFT REVERSE TOTAL SHOULDER ARTHROPLASTY;  Surgeon: [name], MD;  Location: [facility];  Service: Orthopedics;  Laterality: Left;  . rotator cuff  Bilateral 1996/1998   repair  . ROTATOR CUFF REPAIR Left 1996; 1981  . ROTATOR CUFF REPAIR Right 1996  . TONSILLECTOMY    . TOTAL HIP ARTHROPLASTY Left 10/2000  . TOTAL HIP ARTHROPLASTY Right 06/2005  . TOTAL HIP ARTHROPLASTY Left    10/2000  . VAGINAL DELIVERY     x1   Past Medical History:  Diagnosis Date  . Asthmatic bronchitis   . Atrial fibrillation ([place]) 2019  . Cataracts, bilateral    immature  . Chronic back pain    arthritis.Stenosis  . Dysphagia   . Dysrhythmia    (?or extra beats) treated /w atenolol  . Emphysema lung ([place])    left  . GERD (gastroesophageal reflux disease)    takes Omeprazole daily  . Hard of hearing    wears hearing aides   . History of blood transfusion    no abnormal reaction noted  . History of gastric ulcer   . Hypercholesteremia    takes Atorvastatin daily  . Hypertension    takes Coreg and Losartan daily  . Hypothyroidism    takes Synthroid daily  . IBS (irritable bowel syndrome)    takes Librax daily  . Joint pain   . Lumbosacral spondylosis without myelopathy   . Melanoma ([place])    on left leg, wide excision    . Muscle spasm    takes Robaxin daily as needed  . Neuromuscular disorder ([place])    esophageal spasms at times   . Osteoarthritis   . Peripheral edema    take HCTZ daily  . Pneumonia    hx of.Many yrs ago  . 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 Seasonal allergies    takes Allegra daily and uses Flonase daily as needed  . Shortness of breath dyspnea  with exertion  . Venous stasis   . Yeast infection    given Diflucan today   BP 111/71   Pulse 83   Ht 5\' 2"  (1.575 m)   Wt 133 lb 9.6 oz (60.6 kg)   LMP 11/03/1980 (Approximate)   SpO2 96%   BMI 24.44 kg/m   Opioid Risk Score:   Fall Risk Score:  `1  Depression screen PHQ 2/9  Depression screen [facility] 2/9 12/14/2018 11/05/2018 09/13/2018 05/13/2018 04/08/2018 03/10/2018 11/23/2017  Decreased Interest 0 0 0 0 - 0 0  Down, Depressed, Hopeless 0 0 0 0 - 0 0  PHQ - 2 Score 0 0 0 0 - 0 0  Altered sleeping - - - - 0 - -  Tired, decreased energy - - - - - - -  Change in appetite - - - - - - -  Feeling bad or failure about yourself  - - - - - - -  Trouble concentrating - - - - - - -  Moving slowly or fidgety/restless - - - - - - -  Suicidal thoughts - - - - - - -  PHQ-9 Score - - - - - - -  Some recent data might be hidden    Review of Systems  Constitutional: Negative.   HENT: Negative.   Eyes: Negative.   Respiratory: Negative.   Cardiovascular: Negative.   Gastrointestinal: Negative.   Endocrine: Negative.   Genitourinary: Negative.   Musculoskeletal: Negative.   Skin: Negative.   Allergic/Immunologic: Negative.   Neurological: Negative.   Hematological: Negative.   Psychiatric/Behavioral: Negative.   All other systems reviewed and are negative.      Objective:   Physical Exam Vitals signs and nursing note reviewed.  Constitutional:      Appearance: Normal appearance.  Neck:     Musculoskeletal: Normal range of motion and neck supple.  Cardiovascular:     Rate  and Rhythm: Normal rate. Rhythm irregular.     Pulses: Normal pulses.     Heart sounds: Normal heart sounds.  Pulmonary:     Effort: Pulmonary effort is normal.     Breath sounds: Normal breath sounds.  Musculoskeletal:     Comments: Normal Muscle Bulk and Muscle Testing Reveals:  Upper Extremities: Full ROM and Muscle Strength 5/5  Lumbar Paraspinal Tenderness: L-3-L-5 Lower Extremities: Full ROM and Muscle Strength 5/5 Arises from chair with ease Narrow Based Gait   Skin:    General: Skin is warm and dry.  Neurological:     Mental Status: She is alert and oriented to person, place, and time.  Psychiatric:        Mood and Affect: Mood normal.        Behavior: Behavior normal.           Assessment & Plan:  1.Chronic low back pain withlumbar spondylosis:ContinueOxycodone. 5mg  one tablet every 8 hours as needed #75.12/14/2018 We will continue the opioid monitoring program,this consists of regular clinic visits, examinations, urine drug screen, pill counts as well as use of [place] Controlled Substance reporting System. 2. Lumbar Radiculitis: S/PLumbar Radiofrequency with relief noted.Adverse Reaction with Gabapentin and Amitriptyline we willContinue to monitor.12/14/2018 3. Cervicalgia/Cervical Spondylosis/Cervical stenosis with radiculitis/ S/P Anterior Cervical Fusion C3-C4 with Dr. Ellene Route on 10/03/2014.3-level anterior decompression and arthrodesis per [facility] on hold.Continue to Monitor.Dr. Ellene Route Following. 12/14/2018 4. Chronic bilateral shoulder pain:No complaints today.Continue with Current medication regime.12/14/2018 S/P Left Reverse Total Shoulder Arthroplasty on 02/01/16 via Dr. Veverly Fells.  5. Muscle Spasm:No complaints today.Continueto monitor.12/14/2018.  20 minutes of face to face patient care time was spent during this visit. All questions were encouraged and answered.  F/U in 1 month

## 2018-12-15 ENCOUNTER — Encounter: Payer: Self-pay | Admitting: Cardiology

## 2018-12-19 ENCOUNTER — Encounter: Payer: Self-pay | Admitting: Registered Nurse

## 2018-12-27 NOTE — Progress Notes (Signed)
Cardiology Office Note   Date:  12/30/2018   ID:  Tesia, Lybrand [birth year], MRN [id number]  PCP:  [name], MD  Cardiologist:   Joane Postel [place], MD   Chief Complaint  Patient presents with  . Atrial Fibrillation      History of Present Illness: Jenna [name] is a 85 y.o. female who is seen at the request of Dr. Mancel Bale for evaluation of Afib. She has a history of HTN and hypothyroidism. She was seen by Dr. Mancel Bale in January. Noted to be in AFib. Rate 94. Was started on Eliquis. She had prior Echo in October 2016. This showed MAC and grade 1 diastolic dysfunction but was otherwise normal. She is on Coreg for BP and rate control.   She thinks she may have developed Afib as early as September. She was under a lot of stress with her husband having surgery and chemotherapy for lymphoma. She notes her heart skipping and beating hard particularly at night. Notes she is more fatigued and weak. Minimal dyspnea. No chest pain. Notes BP has been running low. No history of CVA or bleeding.     Past Medical History:  Diagnosis Date  . Asthmatic bronchitis   . Atrial fibrillation ([place]) 2019  . Cataracts, bilateral    immature  . Chronic back pain    arthritis.Stenosis  . Dysphagia   . Dysrhythmia    (?or extra beats) treated /w atenolol  . Emphysema lung ([place])    left  . GERD (gastroesophageal reflux disease)    takes Omeprazole daily  . Hard of hearing    wears hearing aides   . History of blood transfusion    no abnormal reaction noted  . History of gastric ulcer   . Hypercholesteremia    takes Atorvastatin daily  . Hypertension    takes Coreg and Losartan daily  . Hypothyroidism    takes Synthroid daily  . IBS (irritable bowel syndrome)    takes Librax daily  . Joint pain   . Lumbosacral spondylosis without myelopathy   . Melanoma ([place])    on left leg, wide excision   . Muscle spasm    takes Robaxin daily as needed  . Neuromuscular disorder ([place])    esophageal spasms at times   . Osteoarthritis   . Peripheral edema    take HCTZ daily  . Pneumonia    hx of.Many yrs ago  . 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 Seasonal allergies    takes Allegra daily and uses Flonase daily as needed  . Shortness of breath dyspnea    with exertion  . Venous stasis   . Yeast infection    given Diflucan today    Past Surgical History:  Procedure Laterality Date  . ABDOMINAL HYSTERECTOMY  1982   BSO  . ANTERIOR CERVICAL DECOMP/DISCECTOMY FUSION N/A 10/03/2014   Procedure: ANTERIOR CERVICAL DECOMPRESSION/DISCECTOMY FUSION 1 LEVEL,CERVICAL THREE-FOUR;  Surgeon: [name], MD;  Location: [facility];  Service: Neurosurgery;  Laterality: N/A;  . APPENDECTOMY  1986   along with chole  . BREAST BIOPSY Right 1997   sclerosing adenosis -Dr. Lucia Gaskins  . CHOLECYSTECTOMY    . COLONOSCOPY    . ESOPHAGOGASTRODUODENOSCOPY     with dilitation  . LIPOMA EXCISION Left 10/1999   left thigh  . lumbosacral cyst  2005   seen on injection for low back pain   . LUNG BIOPSY  05/2001   /w Dr.Burney- bronchoscopy, mediastinoscopy  . REVERSE SHOULDER ARTHROPLASTY Left 02/01/2016   Procedure: LEFT REVERSE TOTAL SHOULDER ARTHROPLASTY;  Surgeon: [name], MD;  Location: [facility];  Service: Orthopedics;  Laterality: Left;  . rotator cuff  Bilateral 1996/1998   repair  . ROTATOR CUFF REPAIR Left 1996; 1981  . ROTATOR CUFF REPAIR Right 1996  . TONSILLECTOMY    . TOTAL HIP ARTHROPLASTY Left 10/2000  . TOTAL HIP ARTHROPLASTY Right 06/2005  . TOTAL HIP ARTHROPLASTY Left    10/2000  . VAGINAL DELIVERY     x1     Current Outpatient Medications  Medication Sig Dispense Refill  . albuterol (PROAIR HFA) 108 (90 Base) MCG/ACT inhaler Inhale 2 puffs into the lungs 4 (four) times daily.    [name] albuterol (PROVENTIL HFA;VENTOLIN HFA) 108 (90 Base) MCG/ACT inhaler Inhale 2 puffs into the lungs every 6 (six) hours as needed for  wheezing or shortness of breath.    [name] amLODipine (NORVASC) 2.5 MG tablet Take 1 tablet by mouth daily.  0  . atorvastatin (LIPITOR) 80 MG tablet Take 40 mg by mouth daily.    . calcium carbonate (OS-CAL) 600 MG TABS Take 600 mg by mouth 2 (two) times daily.    . carvedilol (COREG) 12.5 MG tablet Take 12.5 mg by mouth 2 (two) times daily with a meal.    . cholecalciferol (VITAMIN D) 1000 UNITS tablet Take 1,000 Units by mouth daily.    . clindamycin (CLEOCIN) 150 MG capsule   2  . diazepam (VALIUM) 5 MG tablet Take 2.5 mg by mouth at bedtime as needed for sedation.     [name] estradiol (CLIMARA) 0.025 mg/24hr patch Place 1 patch (0.025 mg total) onto the skin once a week. 4 patch 6  . fexofenadine (ALLEGRA) 180 MG tablet Take 180 mg by mouth daily.    . fluticasone (FLONASE) 50 MCG/ACT nasal spray Place 2 sprays into both nostrils daily as needed for allergies.     . furosemide (LASIX) 20 MG tablet Take 20 mg by mouth.    . hyoscyamine (LEVSIN SL) 0.125 MG SL tablet Place 0.125-0.25 mg under the tongue every 4 (four) hours as needed for cramping.     [name] levothyroxine (SYNTHROID, LEVOTHROID) 112 MCG tablet Take 112 mcg by mouth daily before breakfast.    . losartan (COZAAR) 100 MG tablet Take 1 tablet (100 mg total) by mouth daily. 30 tablet 0  . methocarbamol (ROBAXIN) 500 MG tablet Take 1 tablet (500 mg total) by mouth every 6 (six) hours as needed for muscle spasms. 60 tablet 2  . naphazoline (NAPHCON) 0.1 % ophthalmic solution Place 1 drop into both eyes daily as needed for irritation.    [name] omeprazole (PRILOSEC) 40 MG capsule Take 40 mg by mouth 2 (two) times daily.     [name] oxyCODONE (OXY IR/ROXICODONE) 5 MG immediate release tablet Take 1 tablet (5 mg total) by mouth every 8 (eight) hours as needed for moderate pain. 70 tablet 0  . promethazine (PHENERGAN) 25 MG tablet Take 1 tablet (25 mg total) by mouth 2 (two) times daily as needed. 60 tablet 6  . apixaban (ELIQUIS) 2.5 MG TABS tablet Take 1 tablet  (2.5 mg total) by mouth 2 (two) times daily. 60 tablet 11   No current facility-administered medications for this visit.     Allergies:   Codeine; Pseudoephedrine hcl; Tylenol [acetaminophen]; Ultram [tramadol]; Vicodin [hydrocodone-acetaminophen]; [place] [flurazepam hcl]; Flurazepam; Nortriptyline; Cephalexin; Clarithromycin; Levofloxacin; and Vibramycin [doxycycline  calcium]    Social History:  The patient  reports that she has quit smoking. She has a 15.00 pack-year smoking history. She has never used smokeless tobacco. She reports that she does not drink alcohol or use drugs.   Family History:  The patient's family history includes Diabetes in her brother; Heart attack in her father; Heart disease in her brother; Stroke in her mother.    ROS:  Please see the history of present illness.   Otherwise, review of systems are positive for none.   All other systems are reviewed and negative.    PHYSICAL EXAM: VS:  BP (!) 100/58   Pulse 82   Ht _0  (1.575 m)   Wt 134 lb 3.2 oz (60.9 kg)   LMP 11/03/1980 (Approximate)   BMI 24.55 kg/m  , BMI Body mass index is 24.55 kg/m. GEN: Well nourished, well developed, in no acute distress  HEENT: normal  Neck: no JVD, carotid bruits, or masses Cardiac: IRRR; no murmurs, rubs, or gallops,no edema  Respiratory:  clear to auscultation bilaterally, normal work of breathing GI: soft, nontender, nondistended, + BS MS: no deformity or atrophy  Skin: warm and dry, no rash Neuro:  Strength and sensation are intact Psych: euthymic mood, full affect   EKG:  EKG is ordered today. The ekg ordered today demonstrates Afib with rate 82. Low voltage. I have personally reviewed and interpreted this study.    Recent Labs: No results found for requested labs within last 8760 hours.   Dated 02/02/16: creatinine 0.71. potassium 2.9. Hgb 10.6   Lipid Panel No results found for: CHOL, TRIG, HDL, CHOLHDL, VLDL, LDLCALC, LDLDIRECT    Wt Readings from  Last 3 Encounters:  12/30/18 134 lb 3.2 oz (60.9 kg)  12/14/18 133 lb 9.6 oz (60.6 kg)  11/24/18 133 lb (60.3 kg)      Other studies Reviewed: Additional studies/ records that were reviewed today include:   Echo 08/07/15: Study Conclusions  - Left ventricle: The cavity size was normal. Systolic function was   normal. The estimated ejection fraction was in the range of 55%   to 60%. Wall motion was normal; there were no regional wall   motion abnormalities. There was an increased relative   contribution of atrial contraction to ventricular filling.   Doppler parameters are consistent with abnormal left ventricular   relaxation (grade 1 diastolic dysfunction). - Aortic valve: Trileaflet; normal thickness, mildly calcified   leaflets. - Mitral valve: Calcified annulus.  ASSESSMENT AND PLAN:  1.  Atrial fibrillation. Persistent. May have been present for several months. She is symptomatic. Predisposing risk factors for AFib are age, HTN, and thyroid disease. [place] vasc score of 4. Now on Eliquis for over a month. Given age > 6 and low body weight of 60 kg I would recommend reducing Eliquis to 2.5 mg bid. We discussed treatment options including rate versus rhythm control. She would like to feel better so I think an attempt at [place] is worthwhile. Will check Echo. If it is OK we will arrange for outpatient DCCV. This will also allow [place] to see how much her symptoms improve in NSR. This will inform us if we need to consider AAD therapy should Afib recur. Procedure and risks reviewed with her today. 2. HTN controlled. If BP remains low in NSR will discontinue amlodipine.  3. Hypothyroidism. On replacement. Request a copy of recent lab work   Current medicines are reviewed at length with the patient today.  The  patient does not have concerns regarding medicines.  The following changes have been made:  Reduce Eliquis to 2.5 mg bid.   Labs/ tests ordered today include:   Orders Placed This  Encounter  Procedures  . EKG 12-Lead  . ECHOCARDIOGRAM COMPLETE     Disposition:   FU post DCCV  Signed, Shary Lamos [place], MD  12/30/2018 11:43 AM    [facility] [address], [place], [place], [address] Phone [phone], Fax [phone]

## 2018-12-27 NOTE — Telephone Encounter (Signed)
Message from patient regarding medication reaction

## 2018-12-28 ENCOUNTER — Telehealth: Payer: Self-pay | Admitting: Registered Nurse

## 2018-12-28 MED ORDER — OXYCODONE HCL 5 MG PO TABS: 5.0000 mg | ORAL_TABLET | Freq: Three times a day (TID) | ORAL | 0 refills | Status: DC | PRN

## 2018-12-28 NOTE — Telephone Encounter (Signed)
Spoke with Jenna [name], we will continue current medication regimen, she is tolerating the Oxycodone. Oxycodone e-scribe, she verbalizes understanding.

## 2018-12-30 ENCOUNTER — Encounter: Payer: Self-pay | Admitting: Cardiology

## 2018-12-30 ENCOUNTER — Ambulatory Visit: Payer: Medicare Other | Admitting: Cardiology

## 2018-12-30 VITALS — BP 100/58 | HR 82 | Ht 62.0 in | Wt 134.2 lb

## 2018-12-30 DIAGNOSIS — I10 Essential (primary) hypertension: Secondary | ICD-10-CM | POA: Diagnosis not present

## 2018-12-30 DIAGNOSIS — I4819 Other persistent atrial fibrillation: Secondary | ICD-10-CM

## 2018-12-30 MED ORDER — APIXABAN 2.5 MG PO TABS: 2.5000 mg | ORAL_TABLET | Freq: Two times a day (BID) | ORAL | 11 refills | Status: DC

## 2018-12-30 NOTE — Patient Instructions (Signed)
Reduce Eliquis to 2.5 mg twice a day  We will schedule you for an Echo.  After Echo confirmed we will arrange for you to have a cardioversion.

## 2019-01-04 ENCOUNTER — Encounter: Payer: Self-pay | Admitting: Certified Nurse Midwife

## 2019-01-04 ENCOUNTER — Ambulatory Visit (INDEPENDENT_AMBULATORY_CARE_PROVIDER_SITE_OTHER): Payer: Medicare Other | Admitting: Certified Nurse Midwife

## 2019-01-04 ENCOUNTER — Other Ambulatory Visit: Payer: Self-pay

## 2019-01-04 VITALS — BP 120/68 | HR 70 | Resp 16 | Ht 61.5 in | Wt 130.0 lb

## 2019-01-04 DIAGNOSIS — Z1211 Encounter for screening for malignant neoplasm of colon: Secondary | ICD-10-CM

## 2019-01-04 DIAGNOSIS — Z780 Asymptomatic menopausal state: Secondary | ICD-10-CM

## 2019-01-04 DIAGNOSIS — Z01419 Encounter for gynecological examination (general) (routine) without abnormal findings: Secondary | ICD-10-CM | POA: Diagnosis not present

## 2019-01-04 DIAGNOSIS — Z8679 Personal history of other diseases of the circulatory system: Secondary | ICD-10-CM

## 2019-01-04 NOTE — Progress Notes (Signed)
85 y.o. G8P1001 Married  Caucasian Fe here for annual exam. Post menopausal, no dryness. Spouse had ureter/bladder cancer and now doing well.  Staying busy, recently diagnosed with Atrial Fib and is going to have conversion. Patient tried Vinegar tablets for GERD and had severe pain. "will never do that again", back on her GERD medication. Sees MD for hypothyroid , cholesterol, hypertension and anxiety management. All stable per patient. No other health issues today. Still driving, eating well and ambulating without pain most of the time.   Patient's last menstrual period was 11/03/1980 (approximate).          Sexually active: No.  The current method of family planning is status post hysterectomy.    Exercising: No.  exercise Smoker:  no  Review of Systems  Constitutional: Negative.   HENT: Negative.   Eyes: Negative.   Respiratory: Negative.   Cardiovascular: Negative.   Gastrointestinal: Negative.   Genitourinary: Negative.   Musculoskeletal: Negative.   Skin: Negative.   Neurological: Negative.   Endo/Heme/Allergies: Negative.   Psychiatric/Behavioral: Negative.     Health Maintenance: Pap:  2002 neg History of Abnormal Pap: no MMG:  07-27-18 category b density birads 1:neg Self Breast exams: yes Colonoscopy:  2004, inability to swallow prep BMD:   2018 TDaP:  2013 Shingles: 2016 Pneumonia: had done Hep C and HIV: not done Labs:  If needed   reports that she has quit smoking. She has a 15.00 pack-year smoking history. She has never used smokeless tobacco. She reports that she does not drink alcohol or use drugs.  Past Medical History:  Diagnosis Date  . Asthmatic bronchitis   . Atrial fibrillation ([place]) 2019  . Cataracts, bilateral    immature  . Chronic back pain    arthritis.Stenosis  . Dysphagia   . Dysrhythmia    (?or extra beats) treated /w atenolol  . Emphysema lung ([place])    left  . GERD (gastroesophageal reflux disease)    takes Omeprazole daily  . Hard  of hearing    wears hearing aides   . History of blood transfusion    no abnormal reaction noted  . History of gastric ulcer   . Hypercholesteremia    takes Atorvastatin daily  . Hypertension    takes Coreg and Losartan daily  . Hypothyroidism    takes Synthroid daily  . IBS (irritable bowel syndrome)    takes Librax daily  . Joint pain   . Lumbosacral spondylosis without myelopathy   . Melanoma ([place])    on left leg, wide excision   . Muscle spasm    takes Robaxin daily as needed  . Neuromuscular disorder ([place])    esophageal spasms at times   . Osteoarthritis   . Peripheral edema    take HCTZ daily  . Pneumonia    hx of.Many yrs ago  . 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 Seasonal allergies    takes Allegra daily and uses Flonase daily as needed  . Shortness of breath dyspnea    with exertion  . Venous stasis   . Yeast infection    given Diflucan today    Past Surgical History:  Procedure Laterality Date  . ABDOMINAL HYSTERECTOMY  1982   BSO  . ANTERIOR CERVICAL DECOMP/DISCECTOMY FUSION N/A 10/03/2014   Procedure: ANTERIOR CERVICAL DECOMPRESSION/DISCECTOMY FUSION 1 LEVEL,CERVICAL THREE-FOUR;  Surgeon: [name], MD;  Location: [facility];  Service: Neurosurgery;  Laterality: N/A;  .  APPENDECTOMY  1986   along with chole  . BREAST BIOPSY Right 1997   sclerosing adenosis -Dr. Lucia Gaskins  . CHOLECYSTECTOMY    . COLONOSCOPY    . ESOPHAGOGASTRODUODENOSCOPY     with dilitation  . LIPOMA EXCISION Left 10/1999   left thigh  . lumbosacral cyst  2005   seen on injection for low back pain   . LUNG BIOPSY  05/2001   /w Dr.Burney- bronchoscopy, mediastinoscopy  . REVERSE SHOULDER ARTHROPLASTY Left 02/01/2016   Procedure: LEFT REVERSE TOTAL SHOULDER ARTHROPLASTY;  Surgeon: [name], MD;  Location: [facility];  Service: Orthopedics;  Laterality: Left;  . rotator cuff  Bilateral 1996/1998   repair  . ROTATOR CUFF REPAIR Left  1996; 1981  . ROTATOR CUFF REPAIR Right 1996  . TONSILLECTOMY    . TOTAL HIP ARTHROPLASTY Left 10/2000  . TOTAL HIP ARTHROPLASTY Right 06/2005  . TOTAL HIP ARTHROPLASTY Left    10/2000  . VAGINAL DELIVERY     x1    Current Outpatient Medications  Medication Sig Dispense Refill  . albuterol (PROAIR HFA) 108 (90 Base) MCG/ACT inhaler Inhale 2 puffs into the lungs 4 (four) times daily.    [name] albuterol (PROVENTIL HFA;VENTOLIN HFA) 108 (90 Base) MCG/ACT inhaler Inhale 2 puffs into the lungs every 6 (six) hours as needed for wheezing or shortness of breath.    [name] amLODipine (NORVASC) 2.5 MG tablet Take 1 tablet by mouth daily.  0  . apixaban (ELIQUIS) 2.5 MG TABS tablet Take 1 tablet (2.5 mg total) by mouth 2 (two) times daily. 60 tablet 11  . atorvastatin (LIPITOR) 80 MG tablet Take 40 mg by mouth daily.    . calcium carbonate (OS-CAL) 600 MG TABS Take 600 mg by mouth 2 (two) times daily.    . carvedilol (COREG) 12.5 MG tablet Take 12.5 mg by mouth 2 (two) times daily with a meal.    . cholecalciferol (VITAMIN D) 1000 UNITS tablet Take 1,000 Units by mouth daily.    . clindamycin (CLEOCIN) 150 MG capsule   2  . diazepam (VALIUM) 5 MG tablet Take 2.5 mg by mouth at bedtime as needed for sedation.     [name] estradiol (CLIMARA) 0.025 mg/24hr patch Place 1 patch (0.025 mg total) onto the skin once a week. 4 patch 6  . fexofenadine (ALLEGRA) 180 MG tablet Take 180 mg by mouth daily.    . fluticasone (FLONASE) 50 MCG/ACT nasal spray Place 2 sprays into both nostrils daily as needed for allergies.     . furosemide (LASIX) 20 MG tablet Take 20 mg by mouth.    . hyoscyamine (LEVSIN SL) 0.125 MG SL tablet Place 0.125-0.25 mg under the tongue every 4 (four) hours as needed for cramping.     [name] levothyroxine (SYNTHROID, LEVOTHROID) 112 MCG tablet Take 112 mcg by mouth daily before breakfast.    . losartan (COZAAR) 100 MG tablet Take 1 tablet (100 mg total) by mouth daily. 30 tablet 0  . methocarbamol (ROBAXIN)  500 MG tablet Take 1 tablet (500 mg total) by mouth every 6 (six) hours as needed for muscle spasms. 60 tablet 2  . naphazoline (NAPHCON) 0.1 % ophthalmic solution Place 1 drop into both eyes daily as needed for irritation.    [name] omeprazole (PRILOSEC) 40 MG capsule Take 40 mg by mouth 2 (two) times daily.     [name] oxyCODONE (OXY IR/ROXICODONE) 5 MG immediate release tablet Take 1 tablet (5 mg total) by mouth every 8 (eight)  hours as needed for moderate pain. 70 tablet 0  . promethazine (PHENERGAN) 25 MG tablet Take 1 tablet (25 mg total) by mouth 2 (two) times daily as needed. 60 tablet 6   No current facility-administered medications for this visit.     Family History  Problem Relation Age of Onset  . Stroke Mother   . Heart attack Father   . Diabetes Brother   . Heart disease Brother     ROS:  Pertinent items are noted in HPI.  Otherwise, a comprehensive ROS was negative.  Exam:   LMP 11/03/1980 (Approximate)    Ht Readings from Last 3 Encounters:  12/30/18 5\' 2"  (1.575 m)  12/14/18 5\' 2"  (1.575 m)  11/24/18 5' 1.25" (1.556 m)    General appearance: alert, cooperative and appears stated age Head: Normocephalic, without obvious abnormality, atraumatic Neck: no adenopathy, supple, symmetrical, trachea midline and thyroid normal to inspection and palpation Lungs: clear to auscultation bilaterally Breasts: normal appearance, no masses or tenderness, No nipple retraction or dimpling, No nipple discharge or bleeding, No axillary or supraclavicular adenopathy, pendulous bilateral Heart: regular rate and rhythm Abdomen: soft, non-tender; no masses,  no organomegaly Extremities: extremities normal, atraumatic, no cyanosis or edema Skin: Skin color, texture, turgor normal. No rashes or lesions Lymph nodes: Cervical, supraclavicular, and axillary nodes normal. No abnormal inguinal nodes palpated Neurologic: Grossly normal   Pelvic: External genitalia:  no lesions              Urethra:   normal appearing urethra with no masses, tenderness or lesions              Bartholin's and Skene's: normal                 Vagina: normal appearing vagina with normal color and discharge, no lesions              Cervix: absent              Pap taken: No. Bimanual Exam:  Uterus:  uterus absent              Adnexa: adnexa bilateral absent, no fullness or tenderness               Rectovaginal: Confirms               Anus:  normal sphincter tone, no lesions  Chaperone present: yes  A:  Well Woman with normal exam  Menopausal on HRT 1/2 [place]ara patch  Recent diagnosis of atrial fibrillation has conversion planned  Cholesterol, hypertension,hypothyroid, GERD, joint pain with MD management  Desires IFOB stool test, colonoscopy not recommended now by GI  P:   Reviewed health and wellness pertinent to exam  Discussed due to cardiology issue now patient needs to stop ERT due to increase risk of DVT or stroke. Patient plans to stop prior to conversion. Discussed risk with continuance. No Rx given.  Continue MD follow up regarding other medical issues.  IFOB dispenses with instructions given by CMA.  Pap smear: no   counseled on breast self exam, mammography screening, feminine hygiene, adequate intake of calcium and vitamin D, diet and exercise  return annually or prn  An After Visit Summary was printed and given to the patient.

## 2019-01-04 NOTE — Patient Instructions (Signed)

## 2019-01-12 ENCOUNTER — Ambulatory Visit (HOSPITAL_COMMUNITY): Payer: Medicare Other | Attending: Cardiology

## 2019-01-12 ENCOUNTER — Other Ambulatory Visit: Payer: Self-pay

## 2019-01-12 DIAGNOSIS — I4819 Other persistent atrial fibrillation: Secondary | ICD-10-CM | POA: Diagnosis present

## 2019-01-12 LAB — FECAL OCCULT BLOOD, IMMUNOCHEMICAL: Fecal Occult Bld: NEGATIVE

## 2019-01-17 ENCOUNTER — Telehealth: Payer: Self-pay

## 2019-01-17 DIAGNOSIS — I480 Paroxysmal atrial fibrillation: Secondary | ICD-10-CM

## 2019-01-17 NOTE — Telephone Encounter (Signed)
  You are scheduled for a Cardioversion on Wednesday 01/26/19 with Dr.Nelson.  Please arrive at the [facility] (Main Entrance A) at [facility]: [address] [place], [place] [address] at 12:00 pm. (1 hour prior to procedure unless lab work is needed; if lab work is needed arrive 1.5 hours ahead)  DIET: Nothing to eat or drink after midnight except a sip of water with medications (see medication instructions below)  Medication Instructions: Hold morning dose of Furosemide  Continue your anticoagulant: Eliquis You will need to continue your anticoagulant after your procedure    until you are told by your  Provider that it is safe to stop   Labs: Bmet and cbc to be done 01/21/19 at [facility] at [place] office.  You must have a responsible person to drive you home and stay in the waiting area during your procedure. Failure to do so could result in cancellation.  Bring your insurance cards.  *Special Note: Every effort is made to have your procedure done on time. Occasionally there are emergencies that occur at the hospital that may cause delays. Please be patient if a delay does occur.

## 2019-01-20 ENCOUNTER — Other Ambulatory Visit: Payer: Self-pay | Admitting: Cardiology

## 2019-01-21 ENCOUNTER — Encounter: Payer: Self-pay | Admitting: Cardiology

## 2019-01-21 NOTE — Progress Notes (Signed)
I contacted the patient today concerning her upcoming cardioversion for atrial fibrillation scheduled for January 26, 2019.  She has some fatigue but denies dyspnea or chest pain.  Due to coronavirus pandemic we have elected to postpone her procedure to decrease risk of infection.  We will contact her and reschedule in the next 4 to 6 weeks if possible.  She will call us with any worsening symptoms. Kirk Ruths, MD

## 2019-01-25 ENCOUNTER — Ambulatory Visit: Payer: Medicare Other | Admitting: Registered Nurse

## 2019-01-26 ENCOUNTER — Ambulatory Visit: Payer: Medicare Other | Admitting: Cardiology

## 2019-01-26 ENCOUNTER — Ambulatory Visit (HOSPITAL_COMMUNITY): Admission: RE | Admit: 2019-01-26 | Payer: Medicare Other | Source: Home / Self Care | Admitting: Cardiology

## 2019-01-26 ENCOUNTER — Encounter (HOSPITAL_COMMUNITY): Admission: RE | Payer: Self-pay | Source: Home / Self Care

## 2019-01-26 SURGERY — CARDIOVERSION
Anesthesia: General

## 2019-02-02 ENCOUNTER — Ambulatory Visit: Payer: Medicare Other | Admitting: Cardiology

## 2019-02-03 ENCOUNTER — Encounter: Payer: Medicare Other | Attending: Physical Medicine and Rehabilitation | Admitting: Registered Nurse

## 2019-02-03 ENCOUNTER — Encounter: Payer: Self-pay | Admitting: Registered Nurse

## 2019-02-03 ENCOUNTER — Other Ambulatory Visit: Payer: Self-pay

## 2019-02-03 VITALS — Ht 61.5 in | Wt 130.0 lb

## 2019-02-03 DIAGNOSIS — M12811 Other specific arthropathies, not elsewhere classified, right shoulder: Secondary | ICD-10-CM | POA: Insufficient documentation

## 2019-02-03 DIAGNOSIS — M542 Cervicalgia: Secondary | ICD-10-CM

## 2019-02-03 DIAGNOSIS — Z981 Arthrodesis status: Secondary | ICD-10-CM

## 2019-02-03 DIAGNOSIS — M47816 Spondylosis without myelopathy or radiculopathy, lumbar region: Secondary | ICD-10-CM | POA: Insufficient documentation

## 2019-02-03 DIAGNOSIS — G8929 Other chronic pain: Secondary | ICD-10-CM

## 2019-02-03 DIAGNOSIS — Z79899 Other long term (current) drug therapy: Secondary | ICD-10-CM | POA: Insufficient documentation

## 2019-02-03 DIAGNOSIS — M546 Pain in thoracic spine: Secondary | ICD-10-CM

## 2019-02-03 DIAGNOSIS — Z79891 Long term (current) use of opiate analgesic: Secondary | ICD-10-CM

## 2019-02-03 DIAGNOSIS — M12812 Other specific arthropathies, not elsewhere classified, left shoulder: Secondary | ICD-10-CM | POA: Insufficient documentation

## 2019-02-03 DIAGNOSIS — M47817 Spondylosis without myelopathy or radiculopathy, lumbosacral region: Secondary | ICD-10-CM

## 2019-02-03 DIAGNOSIS — M4712 Other spondylosis with myelopathy, cervical region: Secondary | ICD-10-CM

## 2019-02-03 DIAGNOSIS — G894 Chronic pain syndrome: Secondary | ICD-10-CM

## 2019-02-03 DIAGNOSIS — M47812 Spondylosis without myelopathy or radiculopathy, cervical region: Secondary | ICD-10-CM | POA: Insufficient documentation

## 2019-02-03 DIAGNOSIS — Z5181 Encounter for therapeutic drug level monitoring: Secondary | ICD-10-CM | POA: Insufficient documentation

## 2019-02-03 MED ORDER — OXYCODONE HCL 5 MG PO TABS: 5.0000 mg | ORAL_TABLET | Freq: Three times a day (TID) | ORAL | 0 refills | Status: DC | PRN

## 2019-02-03 NOTE — Progress Notes (Addendum)
Subjective:    Patient ID: Jenna [name], female    DOB: 12-29-1932, 85 y.o.   MRN: [id number]  HPI: Jenna [name] is a 85 y.o. female, her appointment was changed, due to national recommendations of social distancing due to [place] 19, an audio/video telehealth visit is felt to be most appropriate for this patient at this time.  See Chart message from today for the patient's consent to telehealth from [place].    She states her pain is located in her neck, upper back mainly right side and lower back mainly right side. She rates her pain 3. Her  current exercise regime is walking.   Ms. Blackard Morphine equivalent is 21.88 MME.She is also prescribed Diazepam by Dr. Mancel Bale. We have discussed the black box warning of using opioids and benzodiazepines. I highlighted the dangers of using these drugs together and discussed the adverse events including respiratory suppression, overdose, cognitive impairment and importance of compliance with current regimen. We will continue to monitor and adjust as indicated.  [name]    Mancel Parsons CMA performed the Health and History Questions  I Danella Sensing [facility] verified that I am speaking with the correct person using two identifiers.  Pain Inventory Average Pain 5 Pain Right Now 3 My pain is intermittent, sharp, stabbing and aching  In the last 24 hours, has pain interfered with the following? General activity 4 Relation with others 3 Enjoyment of life 3 What TIME of day is your pain at its worst? evening Sleep (in general) Good  Pain is worse with: standing Pain improves with: rest and medication Relief from Meds: 7  Mobility walk without assistance how many minutes can you walk? 15 ability to climb steps?  yes do you drive?  yes  Function retired  Neuro/Psych weakness trouble walking  Prior Studies Any changes since last visit?  no  Physicians involved in your care Any changes since last visit?   no   Family History  Problem Relation Age of Onset  . Stroke Mother   . Heart attack Father   . Diabetes Brother   . Heart disease Brother    Social History   Socioeconomic History  . Marital status: Married    Spouse name: Not on file  . Number of children: Not on file  . Years of education: Not on file  . Highest education level: Not on file  Occupational History  . Not on file  Social Needs  . Financial resource strain: Not on file  . Food insecurity:    Worry: Not on file    Inability: Not on file  . Transportation needs:    Medical: Not on file    Non-medical: Not on file  Tobacco Use  . Smoking status: Former Smoker    Packs/day: 1.00    Years: 15.00    Pack years: 15.00  . Smokeless tobacco: Never Used  . Tobacco comment: quit smoking in 1985  Substance and Sexual Activity  . Alcohol use: No    Frequency: Never  . Drug use: No  . Sexual activity: Not Currently    Partners: Male    Birth control/protection: Surgical    Comment: hysterectomy  Lifestyle  . Physical activity:    Days per week: Not on file    Minutes per session: Not on file  . Stress: Not on file  Relationships  . Social connections:    Talks on phone: Not on file  Gets together: Not on file    Attends religious service: Not on file    Active member of club or organization: Not on file    Attends meetings of clubs or organizations: Not on file    Relationship status: Not on file  Other Topics Concern  . Not on file  Social History Narrative  . Not on file   Past Surgical History:  Procedure Laterality Date  . ABDOMINAL HYSTERECTOMY  1982   BSO  . ANTERIOR CERVICAL DECOMP/DISCECTOMY FUSION N/A 10/03/2014   Procedure: ANTERIOR CERVICAL DECOMPRESSION/DISCECTOMY FUSION 1 LEVEL,CERVICAL THREE-FOUR;  Surgeon: [name], MD;  Location: [facility];  Service: Neurosurgery;  Laterality: N/A;  . APPENDECTOMY  1986   along with chole  . BREAST BIOPSY Right 1997   sclerosing adenosis  -Dr. Lucia Gaskins  . CHOLECYSTECTOMY    . COLONOSCOPY    . ESOPHAGOGASTRODUODENOSCOPY     with dilitation  . LIPOMA EXCISION Left 10/1999   left thigh  . lumbosacral cyst  2005   seen on injection for low back pain   . LUNG BIOPSY  05/2001   /w Dr.Burney- bronchoscopy, mediastinoscopy  . REVERSE SHOULDER ARTHROPLASTY Left 02/01/2016   Procedure: LEFT REVERSE TOTAL SHOULDER ARTHROPLASTY;  Surgeon: [name], MD;  Location: [facility];  Service: Orthopedics;  Laterality: Left;  . rotator cuff  Bilateral 1996/1998   repair  . ROTATOR CUFF REPAIR Left 1996; 1981  . ROTATOR CUFF REPAIR Right 1996  . TONSILLECTOMY    . TOTAL HIP ARTHROPLASTY Left 10/2000  . TOTAL HIP ARTHROPLASTY Right 06/2005  . TOTAL HIP ARTHROPLASTY Left    10/2000  . VAGINAL DELIVERY     x1   Past Medical History:  Diagnosis Date  . Asthmatic bronchitis   . Atrial fibrillation ([place]) 2019  . Cataracts, bilateral    immature  . Chronic back pain    arthritis.Stenosis  . Dysphagia   . Dysrhythmia    (?or extra beats) treated /w atenolol  . Emphysema lung ([place])    left  . GERD (gastroesophageal reflux disease)    takes Omeprazole daily  . Hard of hearing    wears hearing aides   . History of blood transfusion    no abnormal reaction noted  . History of gastric ulcer   . Hypercholesteremia    takes Atorvastatin daily  . Hypertension    takes Coreg and Losartan daily  . Hypothyroidism    takes Synthroid daily  . IBS (irritable bowel syndrome)    takes Librax daily  . Joint pain   . Lumbosacral spondylosis without myelopathy   . Melanoma ([place])    on left leg, wide excision   . Muscle spasm    takes Robaxin daily as needed  . Neuromuscular disorder ([place])    esophageal spasms at times   . Osteoarthritis   . Peripheral edema    take HCTZ daily  . Pneumonia    hx of.Many yrs ago  . 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 Seasonal allergies    takes Allegra  daily and uses Flonase daily as needed  . Shortness of breath dyspnea    with exertion  . Venous stasis   . Yeast infection    given Diflucan today   LMP 11/03/1980 (Approximate)   Opioid Risk Score:   Fall Risk Score:  `1  Depression screen [facility] 2/9  Depression screen [facility] 2/9 12/14/2018 11/05/2018 09/13/2018 05/13/2018 04/08/2018  03/10/2018 11/23/2017  Decreased Interest 0 0 0 0 - 0 0  Down, Depressed, Hopeless 0 0 0 0 - 0 0  PHQ - 2 Score 0 0 0 0 - 0 0  Altered sleeping - - - - 0 - -  Tired, decreased energy - - - - - - -  Change in appetite - - - - - - -  Feeling bad or failure about yourself  - - - - - - -  Trouble concentrating - - - - - - -  Moving slowly or fidgety/restless - - - - - - -  Suicidal thoughts - - - - - - -  PHQ-9 Score - - - - - - -  Some recent data might be hidden    Review of Systems  Constitutional: Negative.   HENT: Negative.   Eyes: Negative.   Respiratory: Negative.   Cardiovascular: Negative.   Gastrointestinal: Negative.   Endocrine: Negative.   Genitourinary: Negative.   Musculoskeletal: Positive for back pain, gait problem, neck pain and neck stiffness.  Skin: Negative.   Allergic/Immunologic: Negative.   Neurological: Positive for weakness.  Hematological: Negative.   Psychiatric/Behavioral: Negative.   All other systems reviewed and are negative.      Objective:   Physical Exam Vitals signs and nursing note reviewed.  Neurological:     Mental Status: She is oriented to person, place, and time.           Assessment & Plan:  1.Chronic low back pain withlumbar spondylosis: Decreased: Slow Weaning: Oxycodone. 5mg  one tablet every 8 hours as needed # 65.02/03/2019 We will continue the opioid monitoring program,this consists of regular clinic visits, examinations, urine drug screen, pill counts as well as use of [place] Controlled Substance reporting System. 2. Lumbar Radiculitis: S/PLumbar Radiofrequency with relief  noted.Adverse Reaction with Gabapentin and Amitriptyline we willContinue to monitor.02/03/2019 3. Cervicalgia/Cervical Spondylosis/Cervical stenosis with radiculitis/ S/P Anterior Cervical Fusion C3-C4 with Dr. Ellene Route on 10/03/2014.3-level anterior decompression and arthrodesis per [facility] on hold.Continue to Monitor.Dr. Ellene Route Following. 02/03/2019 4. Chronic bilateral shoulder pain:No complaints today.Continue with Current medication regime.02/03/2019 S/P Left Reverse Total Shoulder Arthroplasty on 02/01/16 via Dr. Veverly Fells. 5. Chronic Right- sided Thoracic Back pain: Continue HEP as Tolerated. Continue to monitor.  6. Muscle Spasm:No complaints today.Continueto monitor.03/05/2019.   Location of patient: Home Location of provider: Office Time Spent: 10 Minutes

## 2019-02-03 NOTE — Progress Notes (Addendum)
   TELEPHONE CALL NOTE - RESCHEDULING OPV CANCELLATIONS DUE TO COVID-19  This patient has been deemed a candidate for a follow-up tele-health visit to limit community exposure during the Covid-19 pandemic. I spoke with the patient via phone 02/03/2019 and the patient is agreeable to a telehealth visit. I have sent instructions to the patient's MyChart (dotphrase [email or web address]). If the patient does not have an active MyChart account, I have offered to send a link so the patient can set this up. The patient is aware they will receive a phone call 2-3 days prior to their E-Visit, at which time consent will be verbally confirmed.  I will forward this appointment request to the appropriate scheduling pool and the primary cardiologist's assist. I will remove this patient from the C19 cancellation pool.   Please schedule patient for a video telehealth visit in the next 4-6 weeks with Dr. [place].  They will use her husband's smartphone for the evisit.   Ledora Bottcher, PA 02/03/2019, 5:16 PM

## 2019-02-09 ENCOUNTER — Ambulatory Visit: Payer: Medicare Other | Admitting: Cardiology

## 2019-02-09 ENCOUNTER — Telehealth (INDEPENDENT_AMBULATORY_CARE_PROVIDER_SITE_OTHER): Payer: Medicare Other | Admitting: Cardiology

## 2019-02-09 ENCOUNTER — Telehealth: Payer: Self-pay | Admitting: Cardiology

## 2019-02-09 VITALS — BP 124/69 | HR 78 | Ht 61.5 in | Wt 131.0 lb

## 2019-02-09 DIAGNOSIS — I480 Paroxysmal atrial fibrillation: Secondary | ICD-10-CM

## 2019-02-09 DIAGNOSIS — I10 Essential (primary) hypertension: Secondary | ICD-10-CM

## 2019-02-09 NOTE — Telephone Encounter (Signed)
  Patient is returning all to [facility]

## 2019-02-09 NOTE — Progress Notes (Signed)
Virtual Visit via Telephone Note   This visit type was conducted due to national recommendations for restrictions regarding the COVID-19 Pandemic (e.g. social distancing) in an effort to limit this patient's exposure and mitigate transmission in our community.  Due to her co-morbid illnesses, this patient is at least at moderate risk for complications without adequate follow up.  This format is felt to be most appropriate for this patient at this time.  The patient did not have access to video technology/had technical difficulties with video requiring transitioning to audio format only (telephone).  All issues noted in this document were discussed and addressed.  No physical exam could be performed with this format.  Please refer to the patient's chart for her  consent to telehealth for [facility]   Evaluation Performed:  Follow-up visit  Date:  02/09/2019   ID:  Jenna [name], DOB 09-Aug-1933, MRN [id number]  Patient Location: Home  Provider Location: Office  PCP:  [name], MD  Cardiologist:  Jiovanna Frei [place] MD Electrophysiologist:  None   Chief Complaint:  Atrial fibrillation  History of Present Illness:    Jenna [name] is a 85 y.o. female who presents via audio/video conferencing for a telehealth visit today.    She has a history of HTN and hypothyroidism. She was seen by Dr. Mancel Bale in January. Noted to be in AFib. Rate 94. Was started on Eliquis. She had prior Echo in October 2016. This showed MAC and grade 1 diastolic dysfunction but was otherwise normal. She is on Coreg for BP and rate control.   She thinks she may have developed Afib as early as September. She was under a lot of stress with her husband having surgery and chemotherapy for lymphoma. She notes her heart skipping and beating hard particularly at night. Notes she is more fatigued and weak. Minimal dyspnea. No chest pain. Notes BP has been running low. No history of CVA or bleeding. She was initially  scheduled for DCCV in March but due to the pandemic this was cancelled.   She states overall she is doing OK. She is still aware of her heart beating irregular in the early morning and at night. Not so much during the day. Feels a little lightheaded if she turns her head. No chest pain, dyspnea, or syncope. Tolerating medication well.   The patient does not have symptoms concerning for COVID-19 infection (fever, chills, cough, or new shortness of breath).    Past Medical History:  Diagnosis Date  . Asthmatic bronchitis   . Atrial fibrillation ([place]) 2019  . Cataracts, bilateral    immature  . Chronic back pain    arthritis.Stenosis  . Dysphagia   . Dysrhythmia    (?or extra beats) treated /w atenolol  . Emphysema lung ([place])    left  . GERD (gastroesophageal reflux disease)    takes Omeprazole daily  . Hard of hearing    wears hearing aides   . History of blood transfusion    no abnormal reaction noted  . History of gastric ulcer   . Hypercholesteremia    takes Atorvastatin daily  . Hypertension    takes Coreg and Losartan daily  . Hypothyroidism    takes Synthroid daily  . IBS (irritable bowel syndrome)    takes Librax daily  . Joint pain   . Lumbosacral spondylosis without myelopathy   . Melanoma ([place])    on left leg, wide excision   . Muscle spasm    takes Robaxin  daily as needed  . Neuromuscular disorder ([place])    esophageal spasms at times   . Osteoarthritis   . Peripheral edema    take HCTZ daily  . Pneumonia    hx of.Many yrs ago  . 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 Seasonal allergies    takes Allegra daily and uses Flonase daily as needed  . Shortness of breath dyspnea    with exertion  . Venous stasis   . Yeast infection    given Diflucan today   Past Surgical History:  Procedure Laterality Date  . ABDOMINAL HYSTERECTOMY  1982   BSO  . ANTERIOR CERVICAL DECOMP/DISCECTOMY FUSION N/A 10/03/2014    Procedure: ANTERIOR CERVICAL DECOMPRESSION/DISCECTOMY FUSION 1 LEVEL,CERVICAL THREE-FOUR;  Surgeon: [name], MD;  Location: [facility];  Service: Neurosurgery;  Laterality: N/A;  . APPENDECTOMY  1986   along with chole  . BREAST BIOPSY Right 1997   sclerosing adenosis -Dr. Lucia Gaskins  . CHOLECYSTECTOMY    . COLONOSCOPY    . ESOPHAGOGASTRODUODENOSCOPY     with dilitation  . LIPOMA EXCISION Left 10/1999   left thigh  . lumbosacral cyst  2005   seen on injection for low back pain   . LUNG BIOPSY  05/2001   /w Dr.Burney- bronchoscopy, mediastinoscopy  . REVERSE SHOULDER ARTHROPLASTY Left 02/01/2016   Procedure: LEFT REVERSE TOTAL SHOULDER ARTHROPLASTY;  Surgeon: [name], MD;  Location: [facility];  Service: Orthopedics;  Laterality: Left;  . rotator cuff  Bilateral 1996/1998   repair  . ROTATOR CUFF REPAIR Left 1996; 1981  . ROTATOR CUFF REPAIR Right 1996  . TONSILLECTOMY    . TOTAL HIP ARTHROPLASTY Left 10/2000  . TOTAL HIP ARTHROPLASTY Right 06/2005  . TOTAL HIP ARTHROPLASTY Left    10/2000  . VAGINAL DELIVERY     x1     Current Meds  Medication Sig  . albuterol (PROVENTIL HFA;VENTOLIN HFA) 108 (90 Base) MCG/ACT inhaler Inhale 2 puffs into the lungs every 6 (six) hours as needed for wheezing or shortness of breath.  [name] amLODipine (NORVASC) 2.5 MG tablet Take 2.5 mg by mouth daily.   [name] apixaban (ELIQUIS) 2.5 MG TABS tablet Take 1 tablet (2.5 mg total) by mouth 2 (two) times daily.  . Calcium Carbonate-Vitamin D (CALCIUM 600+D PO) Take 1 tablet by mouth daily.  . carvedilol (COREG) 12.5 MG tablet Take 12.5 mg by mouth 2 (two) times daily with a meal.  . cholecalciferol (VITAMIN D) 1000 UNITS tablet Take 1,000 Units by mouth daily.  . diazepam (VALIUM) 5 MG tablet Take 2.5 mg by mouth at bedtime as needed for sedation.   . fluticasone (FLONASE) 50 MCG/ACT nasal spray Place 2 sprays into both nostrils daily as needed for allergies.   . furosemide (LASIX) 20 MG tablet Take 20 mg by  mouth daily.   . hyoscyamine (LEVSIN SL) 0.125 MG SL tablet Place 0.125 mg under the tongue every 4 (four) hours as needed for cramping.   [name] levothyroxine (SYNTHROID, LEVOTHROID) 112 MCG tablet Take 112 mcg by mouth daily before breakfast.  . loratadine (CLARITIN) 10 MG tablet Take 10 mg by mouth daily as needed for allergies.  [name] losartan (COZAAR) 100 MG tablet Take 1 tablet (100 mg total) by mouth daily.  . naphazoline (NAPHCON) 0.1 % ophthalmic solution Place 1 drop into both eyes daily as needed for irritation.  [name] omeprazole (PRILOSEC) 40 MG capsule Take 40 mg by mouth  at bedtime.   [name] oxyCODONE (OXY IR/ROXICODONE) 5 MG immediate release tablet Take 1 tablet (5 mg total) by mouth every 8 (eight) hours as needed for moderate pain.  . Probiotic CAPS Take 1 capsule by mouth daily.     Allergies:   Codeine; Pseudoephedrine hcl; Tylenol [acetaminophen]; Ultram [tramadol]; Vicodin [hydrocodone-acetaminophen]; Dalmane [flurazepam hcl]; Nortriptyline; Cephalexin; Clarithromycin; Levofloxacin; and Vibramycin [doxycycline calcium]   Social History   Tobacco Use  . Smoking status: Former Smoker    Packs/day: 1.00    Years: 15.00    Pack years: 15.00  . Smokeless tobacco: Never Used  . Tobacco comment: quit smoking in 1985  Substance Use Topics  . Alcohol use: No    Frequency: Never  . Drug use: No     Family Hx: The patient's family history includes Diabetes in her brother; Heart attack in her father; Heart disease in her brother; Stroke in her mother.  ROS:   Please see the history of present illness.     All other systems reviewed and are negative.   Prior CV studies:   The following studies were reviewed today:  Echo 01/12/19: IMPRESSIONS    1. The left ventricle has mildly reduced systolic function, with an ejection fraction of 45-50%. The cavity size was normal. Left ventricular diastolic function could not be evaluated secondary to atrial fibrillation. Left ventrical global  hypokinesis  without regional wall motion abnormalities.  2. The right ventricle has normal systolic function. The cavity was normal. There is no increase in right ventricular wall thickness.  3. Left atrial size was moderately dilated.  4. The aortic valve is tricuspid Moderate thickening of the aortic valve Moderate calcification of the aortic valve. Aortic valve regurgitation was not assessed by color flow Doppler.  Labs/Other Tests and Data Reviewed:    EKG:  No ECG reviewed.  Recent Labs: No results found for requested labs within last 8760 hours.   Recent Lipid Panel No results found for: CHOL, TRIG, HDL, CHOLHDL, LDLCALC, LDLDIRECT  Wt Readings from Last 3 Encounters:  02/09/19 131 lb (59.4 kg)  02/03/19 130 lb (59 kg)  01/04/19 130 lb (59 kg)     Objective:    Vital Signs:  BP 124/69   Pulse 78   Ht 5' 1.5" (1.562 m)   Wt 131 lb (59.4 kg)   LMP 11/03/1980 (Approximate)   BMI 24.35 kg/m      ASSESSMENT & PLAN:    1.  Atrial fibrillation. Persistent. May have been present for several months ? Started in September. She is mildly symptomatic. Predisposing risk factors for AFib are age, HTN, and thyroid disease. [place] vasc score of 4. Now on Eliquis for over a month. Given age > 22 and low body weight of 60 kg I would recommend reducing Eliquis to 2.5 mg bid. We will plan on DCCV once elective procedures can be done in light of pandemic.  2. HTN controlled. BP is normal.  3. Hypothyroidism. On replacement. TSH OK.   COVID-19 Education: The signs and symptoms of COVID-19 were discussed with the patient and how to seek care for testing (follow up with PCP or arrange E-visit).  The importance of social distancing was discussed today.  Time:   Today, I have spent 10 minutes with the patient with telehealth technology discussing the above problems.     Medication Adjustments/Labs and Tests Ordered: Current medicines are reviewed at length with the patient today.   Concerns regarding medicines are outlined above.  Tests Ordered: No orders of the defined types were placed in this encounter.  Medication Changes: No orders of the defined types were placed in this encounter.   Disposition:  Follow up in 2 month(s)  Signed, Caitlynn Ju [place], MD  02/09/2019 2:46 PM    [place]

## 2019-02-09 NOTE — Patient Instructions (Addendum)
Medication Instructions:  Continue same medications If you need a refill on your cardiac medications before your next appointment, please call your pharmacy.   Lab work: None ordered   Testing/Procedures: Cardioversion to be scheduled June or July  Follow-Up: At [facility], you and your health needs are our priority.  As part of our continuing mission to provide you with exceptional heart care, we have created designated Provider Care Teams.  These Care Teams include your primary Cardiologist (physician) and Advanced Practice Providers (APPs -  Physician Assistants and Nurse Practitioners) who all work together to provide you with the care you need, when you need it.  . Telephone visit scheduled with Dr.Jordan  Friday 04/15/19 at 2:40 pm to discuss scheduling cardioversion

## 2019-02-10 NOTE — Telephone Encounter (Signed)
Already spoke to patient medications reviewed for 4/8 tele visit.

## 2019-02-16 ENCOUNTER — Telehealth: Payer: Self-pay

## 2019-02-16 ENCOUNTER — Telehealth: Payer: Self-pay | Admitting: Cardiology

## 2019-02-16 MED ORDER — APIXABAN 2.5 MG PO TABS: 2.5000 mg | ORAL_TABLET | Freq: Two times a day (BID) | ORAL | 2 refills | Status: DC

## 2019-02-16 MED ORDER — APIXABAN 2.5 MG PO TABS: 2.5000 mg | ORAL_TABLET | Freq: Two times a day (BID) | ORAL | 1 refills | Status: DC

## 2019-02-16 NOTE — Telephone Encounter (Signed)
New Message           *STAT* If patient is at the pharmacy, call can be transferred to refill team.   1. Which medications need to be refilled? (please list name of each medication and dose if known) Eliquio  2. Which pharmacy/location (including street and city if local pharmacy) is medication to be sent to? [facility] [phone]  3. Do they need a 30 day or 90 day supply? 90 Patient would like change 90 days

## 2019-02-16 NOTE — Telephone Encounter (Signed)
rx sent

## 2019-02-16 NOTE — Telephone Encounter (Signed)
Eliquis has already been sent in to the RX

## 2019-03-09 ENCOUNTER — Encounter: Payer: Medicare Other | Attending: Physical Medicine and Rehabilitation | Admitting: Registered Nurse

## 2019-03-09 ENCOUNTER — Encounter: Payer: Self-pay | Admitting: Registered Nurse

## 2019-03-09 ENCOUNTER — Other Ambulatory Visit: Payer: Self-pay

## 2019-03-09 VITALS — BP 108/80 | HR 90 | Ht 61.5 in | Wt 130.0 lb

## 2019-03-09 DIAGNOSIS — Z79891 Long term (current) use of opiate analgesic: Secondary | ICD-10-CM

## 2019-03-09 DIAGNOSIS — M12811 Other specific arthropathies, not elsewhere classified, right shoulder: Secondary | ICD-10-CM | POA: Insufficient documentation

## 2019-03-09 DIAGNOSIS — M12812 Other specific arthropathies, not elsewhere classified, left shoulder: Secondary | ICD-10-CM | POA: Insufficient documentation

## 2019-03-09 DIAGNOSIS — M542 Cervicalgia: Secondary | ICD-10-CM | POA: Diagnosis not present

## 2019-03-09 DIAGNOSIS — Z5181 Encounter for therapeutic drug level monitoring: Secondary | ICD-10-CM

## 2019-03-09 DIAGNOSIS — Z981 Arthrodesis status: Secondary | ICD-10-CM

## 2019-03-09 DIAGNOSIS — M47812 Spondylosis without myelopathy or radiculopathy, cervical region: Secondary | ICD-10-CM | POA: Insufficient documentation

## 2019-03-09 DIAGNOSIS — M546 Pain in thoracic spine: Secondary | ICD-10-CM

## 2019-03-09 DIAGNOSIS — G8929 Other chronic pain: Secondary | ICD-10-CM

## 2019-03-09 DIAGNOSIS — G894 Chronic pain syndrome: Secondary | ICD-10-CM

## 2019-03-09 DIAGNOSIS — Z79899 Other long term (current) drug therapy: Secondary | ICD-10-CM | POA: Insufficient documentation

## 2019-03-09 DIAGNOSIS — M4712 Other spondylosis with myelopathy, cervical region: Secondary | ICD-10-CM

## 2019-03-09 DIAGNOSIS — M47817 Spondylosis without myelopathy or radiculopathy, lumbosacral region: Secondary | ICD-10-CM

## 2019-03-09 DIAGNOSIS — M47816 Spondylosis without myelopathy or radiculopathy, lumbar region: Secondary | ICD-10-CM | POA: Insufficient documentation

## 2019-03-09 NOTE — Progress Notes (Signed)
Subjective:    Patient ID: Jenna [name], female    DOB: 08/03/1933, 85 y.o.   MRN: [id number]  HPI: Jenna [name] is a 85 y.o. female her appointment was changed, due to national recommendations of social distancing due to [place] 19, an audio/video telehealth visit is felt to be most appropriate for this patient at this time.  See Chart message from today for the patient's consent to telehealth from [place].    She states her pain is located in her mid-lower back pain. She rates her pain 3. Her  current exercise regime is walking.  [name] Morphine equivalent is 23.21 MME. She  is also prescribed Diazepam by Dr. Mancel Bale..We have discussed the black box warning of using opioids and benzodiazepines. I highlighted the dangers of using these drugs together and discussed the adverse events including respiratory suppression, overdose, cognitive impairment and importance of compliance with current regimen. We will continue to monitor and adjust as indicated.   [name] reports she is waiting to be scheduled for cardioversion, she reports concerns with waiting for a scheduled date, she was instructed to call her cardiologist, she verbalizes understanding.   [name] also reports  She had decreased her Oxycodone to 1- 2 tablets daily. No prescription will be e-scribed today.   [name] CMA asked the Health and History Questions this provider and Mancel Parsons verified we were speaking with the correct person using two identifiers.  Pain Inventory Average Pain 4 Pain Right Now 3 My pain is intermittent, dull and aching  In the last 24 hours, has pain interfered with the following? General activity 5 Relation with others 2 Enjoyment of life 2 What TIME of day is your pain at its worst? evening Sleep (in general) Fair  Pain is worse with: standing and some activites Pain improves with: rest and medication Relief from Meds: 6  Mobility walk  without assistance walk with assistance use a cane how many minutes can you walk? 10 ability to climb steps?  yes do you drive?  yes  Function retired  Neuro/Psych weakness trouble walking  Prior Studies Any changes since last visit?  no  Physicians involved in your care Any changes since last visit?  no   Family History  Problem Relation Age of Onset  . Stroke Mother   . Heart attack Father   . Diabetes Brother   . Heart disease Brother    Social History   Socioeconomic History  . Marital status: Married    Spouse name: Not on file  . Number of children: Not on file  . Years of education: Not on file  . Highest education level: Not on file  Occupational History  . Not on file  Social Needs  . Financial resource strain: Not on file  . Food insecurity:    Worry: Not on file    Inability: Not on file  . Transportation needs:    Medical: Not on file    Non-medical: Not on file  Tobacco Use  . Smoking status: Former Smoker    Packs/day: 1.00    Years: 15.00    Pack years: 15.00  . Smokeless tobacco: Never Used  . Tobacco comment: quit smoking in 1985  Substance and Sexual Activity  . Alcohol use: No    Frequency: Never  . Drug use: No  . Sexual activity: Not Currently    Partners: Male    Birth control/protection: Surgical  Comment: hysterectomy  Lifestyle  . Physical activity:    Days per week: Not on file    Minutes per session: Not on file  . Stress: Not on file  Relationships  . Social connections:    Talks on phone: Not on file    Gets together: Not on file    Attends religious service: Not on file    Active member of club or organization: Not on file    Attends meetings of clubs or organizations: Not on file    Relationship status: Not on file  Other Topics Concern  . Not on file  Social History Narrative  . Not on file   Past Surgical History:  Procedure Laterality Date  . ABDOMINAL HYSTERECTOMY  1982   BSO  . ANTERIOR CERVICAL  DECOMP/DISCECTOMY FUSION N/A 10/03/2014   Procedure: ANTERIOR CERVICAL DECOMPRESSION/DISCECTOMY FUSION 1 LEVEL,CERVICAL THREE-FOUR;  Surgeon: [name], MD;  Location: [facility];  Service: Neurosurgery;  Laterality: N/A;  . APPENDECTOMY  1986   along with chole  . BREAST BIOPSY Right 1997   sclerosing adenosis -Dr. Lucia Gaskins  . CHOLECYSTECTOMY    . COLONOSCOPY    . ESOPHAGOGASTRODUODENOSCOPY     with dilitation  . LIPOMA EXCISION Left 10/1999   left thigh  . lumbosacral cyst  2005   seen on injection for low back pain   . LUNG BIOPSY  05/2001   /w Dr.Burney- bronchoscopy, mediastinoscopy  . REVERSE SHOULDER ARTHROPLASTY Left 02/01/2016   Procedure: LEFT REVERSE TOTAL SHOULDER ARTHROPLASTY;  Surgeon: [name], MD;  Location: [facility];  Service: Orthopedics;  Laterality: Left;  . rotator cuff  Bilateral 1996/1998   repair  . ROTATOR CUFF REPAIR Left 1996; 1981  . ROTATOR CUFF REPAIR Right 1996  . TONSILLECTOMY    . TOTAL HIP ARTHROPLASTY Left 10/2000  . TOTAL HIP ARTHROPLASTY Right 06/2005  . TOTAL HIP ARTHROPLASTY Left    10/2000  . VAGINAL DELIVERY     x1   Past Medical History:  Diagnosis Date  . Asthmatic bronchitis   . Atrial fibrillation ([place]) 2019  . Cataracts, bilateral    immature  . Chronic back pain    arthritis.Stenosis  . Dysphagia   . Dysrhythmia    (?or extra beats) treated /w atenolol  . Emphysema lung ([place])    left  . GERD (gastroesophageal reflux disease)    takes Omeprazole daily  . Hard of hearing    wears hearing aides   . History of blood transfusion    no abnormal reaction noted  . History of gastric ulcer   . Hypercholesteremia    takes Atorvastatin daily  . Hypertension    takes Coreg and Losartan daily  . Hypothyroidism    takes Synthroid daily  . IBS (irritable bowel syndrome)    takes Librax daily  . Joint pain   . Lumbosacral spondylosis without myelopathy   . Melanoma ([place])    on left leg, wide excision   . Muscle spasm     takes Robaxin daily as needed  . Neuromuscular disorder ([place])    esophageal spasms at times   . Osteoarthritis   . Peripheral edema    take HCTZ daily  . Pneumonia    hx of.Many yrs ago  . 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 Seasonal allergies    takes Allegra daily and uses Flonase daily as needed  . Shortness of breath dyspnea  with exertion  . Venous stasis   . Yeast infection    given Diflucan today   LMP 11/03/1980 (Approximate)   Opioid Risk Score:   Fall Risk Score:  `1  Depression screen PHQ 2/9  Depression screen [facility] 2/9 12/14/2018 11/05/2018 09/13/2018 05/13/2018 04/08/2018 03/10/2018 11/23/2017  Decreased Interest 0 0 0 0 - 0 0  Down, Depressed, Hopeless 0 0 0 0 - 0 0  PHQ - 2 Score 0 0 0 0 - 0 0  Altered sleeping - - - - 0 - -  Tired, decreased energy - - - - - - -  Change in appetite - - - - - - -  Feeling bad or failure about yourself  - - - - - - -  Trouble concentrating - - - - - - -  Moving slowly or fidgety/restless - - - - - - -  Suicidal thoughts - - - - - - -  PHQ-9 Score - - - - - - -  Some recent data might be hidden    Review of Systems  Constitutional: Negative.   HENT: Negative.   Eyes: Negative.   Respiratory: Negative.   Cardiovascular: Negative.   Gastrointestinal: Negative.   Endocrine: Negative.   Genitourinary: Negative.   Musculoskeletal: Positive for back pain and gait problem.  Skin: Negative.   Neurological: Positive for weakness.  Hematological: Negative.   Psychiatric/Behavioral: Negative.   All other systems reviewed and are negative.      Objective:   Physical Exam Vitals signs and nursing note reviewed.  Musculoskeletal:     Comments: No Physical Exam Performed: Virtual Visit  Neurological:     Mental Status: She is oriented to person, place, and time.           Assessment & Plan:  .Chronic low back pain withlumbar spondylosis: Continue with  Slow Weaning:  No prescription escribe today. Oxycodone. 5mg  one tablet every 8 hours as needed # 65.03/09/2019 We will continue the opioid monitoring program,this consists of regular clinic visits, examinations, urine drug screen, pill counts as well as use of [place] Controlled Substance reporting System. 2. Lumbar Radiculitis: S/PLumbar Radiofrequency with relief noted.Adverse Reaction with Gabapentin and Amitriptyline we willContinue to monitor.03/09/2019 3. Cervicalgia/Cervical Spondylosis/Cervical stenosis with radiculitis/ S/P Anterior Cervical Fusion C3-C4 with Dr. Ellene Route on 10/03/2014.3-level anterior decompression and arthrodesis per [facility] on hold.Continue to Monitor.Dr. Ellene Route Following. 03/09/2019 4. Chronic bilateral shoulder pain:No complaints today.Continue with Current medication regime.03/09/2019 S/P Left Reverse Total Shoulder Arthroplasty on 02/01/16 via Dr. Veverly Fells. 5. Chronic Right- sided Thoracic Back pain: Continue HEP as Tolerated. Continue to monitor. 03/09/2019. 6. Muscle Spasm:No complaints today.Continueto monitor.03/09/2019.  Telephone Call Location of patient: In Her Home Location of provider: Office Established patient Time spent on call: 15 Minutes

## 2019-04-08 ENCOUNTER — Telehealth: Payer: Self-pay | Admitting: Cardiology

## 2019-04-08 NOTE — Telephone Encounter (Signed)
Mychart, no smartphone, consent, pre reg complete 04/08/19 AF

## 2019-04-12 ENCOUNTER — Ambulatory Visit: Payer: Medicare Other | Admitting: Registered Nurse

## 2019-04-12 NOTE — Progress Notes (Signed)
Virtual Visit via Telephone Note   This visit type was conducted due to national recommendations for restrictions regarding the COVID-19 Pandemic (e.g. social distancing) in an effort to limit this patient's exposure and mitigate transmission in our community.  Due to her co-morbid illnesses, this patient is at least at moderate risk for complications without adequate follow up.  This format is felt to be most appropriate for this patient at this time.  The patient did not have access to video technology/had technical difficulties with video requiring transitioning to audio format only (telephone).  All issues noted in this document were discussed and addressed.  No physical exam could be performed with this format.  Please refer to the patient's chart for her  consent to telehealth for [facility].   Date:  04/15/2019   ID:  Jenna [name], DOB 01-Jan-1933, MRN [id number]  Patient Location: Home Provider Location: Home  PCP:  [name], MD  Cardiologist:  Peter Martinique MD Electrophysiologist:  None   Evaluation Performed:  Follow-Up Visit  Chief Complaint:  Atrial fibrillation  History of Present Illness:    Jenna [name] is a 85 y.o. female with She has a history of HTN and hypothyroidism.She was seen by Dr. Mancel Bale in January. Noted to be in AFib. Rate 94. Was started on Eliquis. She had prior Echo in October 2016. This showed MAC and grade 1 diastolic dysfunction but was otherwise normal. She is on Coreg for BP and rate control.  She thinks she may have developed Afib as early as September. She was under a lot of stress with her husband having surgery and chemotherapy for lymphoma. She notes her heart skipping and beating hard particularly at night. Notes she is more fatigued and weak. Minimal dyspnea. No chest pain. Notes BP has been running low. No history of CVA or bleeding.She was initially scheduled for DCCV in March but due to the pandemic this was cancelled.   On  follow up she notes she is more aware of the Afib particularly at night and in the morning. Feels tired all the times. Notes SOB. She is ready to have DCCV done. She has not missed any Eliquis doses.  The patient does not have symptoms concerning for COVID-19 infection (fever, chills, cough, or new shortness of breath).    Past Medical History:  Diagnosis Date  . Asthmatic bronchitis   . Atrial fibrillation ([place]) 2019  . Cataracts, bilateral    immature  . Chronic back pain    arthritis.Stenosis  . Dysphagia   . Dysrhythmia    (?or extra beats) treated /w atenolol  . Emphysema lung ([place])    left  . GERD (gastroesophageal reflux disease)    takes Omeprazole daily  . Hard of hearing    wears hearing aides   . History of blood transfusion    no abnormal reaction noted  . History of gastric ulcer   . Hypercholesteremia    takes Atorvastatin daily  . Hypertension    takes Coreg and Losartan daily  . Hypothyroidism    takes Synthroid daily  . IBS (irritable bowel syndrome)    takes Librax daily  . Joint pain   . Lumbosacral spondylosis without myelopathy   . Melanoma ([place])    on left leg, wide excision   . Muscle spasm    takes Robaxin daily as needed  . Neuromuscular disorder ([place])    esophageal spasms at times   . Osteoarthritis   . Peripheral edema  take HCTZ daily  . Pneumonia    hx of.Many yrs ago  . 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 Seasonal allergies    takes Allegra daily and uses Flonase daily as needed  . Shortness of breath dyspnea    with exertion  . Venous stasis   . Yeast infection    given Diflucan today   Past Surgical History:  Procedure Laterality Date  . ABDOMINAL HYSTERECTOMY  1982   BSO  . ANTERIOR CERVICAL DECOMP/DISCECTOMY FUSION N/A 10/03/2014   Procedure: ANTERIOR CERVICAL DECOMPRESSION/DISCECTOMY FUSION 1 LEVEL,CERVICAL THREE-FOUR;  Surgeon: [name], MD;  Location: [facility];   Service: Neurosurgery;  Laterality: N/A;  . APPENDECTOMY  1986   along with chole  . BREAST BIOPSY Right 1997   sclerosing adenosis -Dr. Lucia Gaskins  . CHOLECYSTECTOMY    . COLONOSCOPY    . ESOPHAGOGASTRODUODENOSCOPY     with dilitation  . LIPOMA EXCISION Left 10/1999   left thigh  . lumbosacral cyst  2005   seen on injection for low back pain   . LUNG BIOPSY  05/2001   /w Dr.Burney- bronchoscopy, mediastinoscopy  . REVERSE SHOULDER ARTHROPLASTY Left 02/01/2016   Procedure: LEFT REVERSE TOTAL SHOULDER ARTHROPLASTY;  Surgeon: [name], MD;  Location: [facility];  Service: Orthopedics;  Laterality: Left;  . rotator cuff  Bilateral 1996/1998   repair  . ROTATOR CUFF REPAIR Left 1996; 1981  . ROTATOR CUFF REPAIR Right 1996  . TONSILLECTOMY    . TOTAL HIP ARTHROPLASTY Left 10/2000  . TOTAL HIP ARTHROPLASTY Right 06/2005  . TOTAL HIP ARTHROPLASTY Left    10/2000  . VAGINAL DELIVERY     x1     Current Meds  Medication Sig  . albuterol (PROVENTIL HFA;VENTOLIN HFA) 108 (90 Base) MCG/ACT inhaler Inhale 2 puffs into the lungs every 6 (six) hours as needed for wheezing or shortness of breath.  [name] amLODipine (NORVASC) 2.5 MG tablet Take 2.5 mg by mouth daily.   [name] apixaban (ELIQUIS) 2.5 MG TABS tablet Take 1 tablet (2.5 mg total) by mouth 2 (two) times daily.  [name] atorvastatin (LIPITOR) 80 MG tablet Take 40 mg by mouth daily.  . Calcium Carbonate-Vitamin D (CALCIUM 600+D PO) Take 1 tablet by mouth daily.  . carvedilol (COREG) 12.5 MG tablet Take 12.5 mg by mouth 2 (two) times daily with a meal.  . cholecalciferol (VITAMIN D) 1000 UNITS tablet Take 1,000 Units by mouth daily.  . diazepam (VALIUM) 5 MG tablet Take 2.5 mg by mouth at bedtime as needed for sedation.   [name] estradiol (CLIMARA) 0.025 mg/24hr patch Place 1 patch (0.025 mg total) onto the skin once a week.  . fexofenadine (ALLEGRA) 180 MG tablet Take 180 mg by mouth daily as needed for allergies.   . fluticasone (FLONASE) 50 MCG/ACT nasal  spray Place 2 sprays into both nostrils daily as needed for allergies.   . furosemide (LASIX) 20 MG tablet Take 20 mg by mouth daily.   . hyoscyamine (LEVSIN SL) 0.125 MG SL tablet Place 0.125 mg under the tongue every 4 (four) hours as needed for cramping.   [name] levothyroxine (SYNTHROID, LEVOTHROID) 112 MCG tablet Take 112 mcg by mouth daily before breakfast.  . loratadine (CLARITIN) 10 MG tablet Take 10 mg by mouth daily as needed for allergies.  [name] losartan (COZAAR) 100 MG tablet Take 1 tablet (100 mg total) by mouth daily.  . naphazoline (NAPHCON) 0.1 % ophthalmic solution  Place 1 drop into both eyes daily as needed for irritation.  [name] omeprazole (PRILOSEC) 40 MG capsule Take 40 mg by mouth at bedtime.   [name] oxyCODONE (OXY IR/ROXICODONE) 5 MG immediate release tablet Take 1 tablet (5 mg total) by mouth every 8 (eight) hours as needed for moderate pain.  . Probiotic CAPS Take 1 capsule by mouth daily.     Allergies:   Codeine, Pseudoephedrine hcl, Tylenol [acetaminophen], Ultram [tramadol], Vicodin [hydrocodone-acetaminophen], Dalmane [flurazepam hcl], Nortriptyline, Cephalexin, Clarithromycin, Levofloxacin, and Vibramycin [doxycycline calcium]   Social History   Tobacco Use  . Smoking status: Former Smoker    Packs/day: 1.00    Years: 15.00    Pack years: 15.00  . Smokeless tobacco: Never Used  . Tobacco comment: quit smoking in 1985  Substance Use Topics  . Alcohol use: No    Frequency: Never  . Drug use: No     Family Hx: The patient's family history includes Diabetes in her brother; Heart attack in her father; Heart disease in her brother; Stroke in her mother.  ROS:   Please see the history of present illness.    All other systems reviewed and are negative.   Prior CV studies:   The following studies were reviewed today:  Echo 01/12/19: IMPRESSIONS   1. The left ventricle has mildly reduced systolic function, with an ejection fraction of 45-50%. The cavity size was  normal. Left ventricular diastolic function could not be evaluated secondary to atrial fibrillation. Left ventrical global hypokinesis  without regional wall motion abnormalities. 2. The right ventricle has normal systolic function. The cavity was normal. There is no increase in right ventricular wall thickness. 3. Left atrial size was moderately dilated. 4. The aortic valve is tricuspid Moderate thickening of the aortic valve Moderate calcification of the aortic valve. Aortic valve regurgitation was not assessed by color flow Doppler.  Labs/Other Tests and Data Reviewed:    EKG:  No ECG reviewed.  Recent Labs: No results found for requested labs within last 8760 hours.   Recent Lipid Panel No results found for: CHOL, TRIG, HDL, CHOLHDL, LDLCALC, LDLDIRECT  Wt Readings from Last 3 Encounters:  04/15/19 130 lb (59 kg)  03/09/19 130 lb (59 kg)  02/09/19 131 lb (59.4 kg)     Objective:    Vital Signs:  BP 106/61   Pulse 93   Ht 5' 1.5" (1.562 m)   Wt 130 lb (59 kg)   LMP 11/03/1980 (Approximate)   BMI 24.17 kg/m    VITAL SIGNS:  reviewed  ASSESSMENT & PLAN:    1.Atrial fibrillation. Persistent. May have been present for several months ? Started in September. She is mildly symptomatic. Predisposing risk factors for AFib are age, HTN, and thyroid disease. [place] vasc score of 4. Now on Eliquis for over a month. Given age > 82 and low body weight of 60 kg I would recommend reducing Eliquis to 2.5 mg bid. We will plan on DCCV once elective procedures can be done in light of pandemic.  2. HTN controlled. BP is normal.  3. Hypothyroidism. On replacement. TSH OK.   COVID-19 Education: The signs and symptoms of COVID-19 were discussed with the patient and how to seek care for testing (follow up with PCP or arrange E-visit).  The importance of social distancing was discussed today.  Time:   Today, I have spent 15 minutes with the patient with telehealth technology discussing the  above problems.     Medication Adjustments/Labs and Tests  Ordered: Current medicines are reviewed at length with the patient today.  Concerns regarding medicines are outlined above.   Tests Ordered: No orders of the defined types were placed in this encounter.   Medication Changes: No orders of the defined types were placed in this encounter.   Disposition:  Follow up post cardioversion  Signed, Peter Martinique, MD  04/15/2019 2:21 PM    Sisters

## 2019-04-15 ENCOUNTER — Encounter: Payer: Self-pay | Admitting: Cardiology

## 2019-04-15 ENCOUNTER — Telehealth: Payer: Self-pay | Admitting: Cardiology

## 2019-04-15 ENCOUNTER — Telehealth (INDEPENDENT_AMBULATORY_CARE_PROVIDER_SITE_OTHER): Payer: Medicare Other | Admitting: Cardiology

## 2019-04-15 ENCOUNTER — Other Ambulatory Visit: Payer: Self-pay | Admitting: Cardiology

## 2019-04-15 VITALS — BP 106/61 | HR 93 | Ht 61.5 in | Wt 130.0 lb

## 2019-04-15 DIAGNOSIS — Z01812 Encounter for preprocedural laboratory examination: Secondary | ICD-10-CM

## 2019-04-15 DIAGNOSIS — I10 Essential (primary) hypertension: Secondary | ICD-10-CM

## 2019-04-15 DIAGNOSIS — I4819 Other persistent atrial fibrillation: Secondary | ICD-10-CM

## 2019-04-15 NOTE — Telephone Encounter (Signed)
New Message     Pt is calling for Jenna [name]  and said she wants to make a special effort to leave the Cardio version appt the way it was scheduled and not change it     Please call back

## 2019-04-15 NOTE — Addendum Note (Signed)
Addended by: Kathyrn Lass on: 04/15/2019 06:59 PM   Modules accepted: Orders

## 2019-04-15 NOTE — Patient Instructions (Signed)
  You are scheduled for a Cardioversion on Friday 04/29/19 with Dr.Crenshaw.  Please arrive at the [facility] (Main Entrance A) at [facility]: [address] [place], [place] [address] at 1:15 pm (1 hour prior to procedure )  DIET: Nothing to eat or drink after midnight except a sip of water with medications (see medication instructions below)  Medication Instructions: Hold  Furosemide morning of procedure  Continue your anticoagulant: Eliquis You will need to continue your anticoagulant after your procedure until you  are told by your  Provider that it is safe to stop   Labs: Bmet,cbc to be done at our office [place] Tuesday 04/26/19 at 2:00 pm Then you will need Covid  test at [facility] at 2:22 pm  You must have a responsible person to drive you home and stay in the waiting area during your procedure. Failure to do so could result in cancellation.  Bring your insurance cards.  *Special Note: Every effort is made to have your procedure done on time. Occasionally there are emergencies that occur at the hospital that may cause delays. Please be patient if a delay does occur.

## 2019-04-15 NOTE — Telephone Encounter (Signed)
Reroute

## 2019-04-19 ENCOUNTER — Other Ambulatory Visit: Payer: Self-pay

## 2019-04-19 DIAGNOSIS — I4819 Other persistent atrial fibrillation: Secondary | ICD-10-CM

## 2019-04-19 DIAGNOSIS — Z01812 Encounter for preprocedural laboratory examination: Secondary | ICD-10-CM

## 2019-04-21 ENCOUNTER — Encounter: Payer: Self-pay | Admitting: Registered Nurse

## 2019-04-21 ENCOUNTER — Encounter: Payer: Medicare Other | Attending: Physical Medicine and Rehabilitation | Admitting: Registered Nurse

## 2019-04-21 ENCOUNTER — Other Ambulatory Visit: Payer: Self-pay

## 2019-04-21 VITALS — BP 130/85 | HR 88 | Temp 97.6°F | Resp 14 | Ht 62.5 in | Wt 135.0 lb

## 2019-04-21 DIAGNOSIS — Z981 Arthrodesis status: Secondary | ICD-10-CM

## 2019-04-21 DIAGNOSIS — Z79899 Other long term (current) drug therapy: Secondary | ICD-10-CM | POA: Diagnosis present

## 2019-04-21 DIAGNOSIS — M4712 Other spondylosis with myelopathy, cervical region: Secondary | ICD-10-CM | POA: Diagnosis not present

## 2019-04-21 DIAGNOSIS — M47812 Spondylosis without myelopathy or radiculopathy, cervical region: Secondary | ICD-10-CM | POA: Diagnosis present

## 2019-04-21 DIAGNOSIS — G8929 Other chronic pain: Secondary | ICD-10-CM

## 2019-04-21 DIAGNOSIS — M47816 Spondylosis without myelopathy or radiculopathy, lumbar region: Secondary | ICD-10-CM | POA: Diagnosis not present

## 2019-04-21 DIAGNOSIS — M12811 Other specific arthropathies, not elsewhere classified, right shoulder: Secondary | ICD-10-CM | POA: Diagnosis present

## 2019-04-21 DIAGNOSIS — M12812 Other specific arthropathies, not elsewhere classified, left shoulder: Secondary | ICD-10-CM | POA: Insufficient documentation

## 2019-04-21 DIAGNOSIS — M47817 Spondylosis without myelopathy or radiculopathy, lumbosacral region: Secondary | ICD-10-CM

## 2019-04-21 DIAGNOSIS — G894 Chronic pain syndrome: Secondary | ICD-10-CM

## 2019-04-21 DIAGNOSIS — M546 Pain in thoracic spine: Secondary | ICD-10-CM

## 2019-04-21 DIAGNOSIS — Z5181 Encounter for therapeutic drug level monitoring: Secondary | ICD-10-CM | POA: Diagnosis not present

## 2019-04-21 DIAGNOSIS — M542 Cervicalgia: Secondary | ICD-10-CM

## 2019-04-21 DIAGNOSIS — Z79891 Long term (current) use of opiate analgesic: Secondary | ICD-10-CM | POA: Diagnosis not present

## 2019-04-21 MED ORDER — OXYCODONE HCL 5 MG PO TABS: 5.0000 mg | ORAL_TABLET | Freq: Three times a day (TID) | ORAL | 0 refills | Status: DC | PRN

## 2019-04-21 NOTE — Progress Notes (Signed)
Subjective:    Patient ID: Jenna [name], female    DOB: [birth year], 85 y.o.   MRN: [id number]  HPI: Jenna [name] is a 85 y.o. female who returns for follow up appointment for chronic pain and medication refill. She states her pain is located in her head  ( temporal) mainly right side radiating into her neck mainly right side, she will be scheduling an appointment with Dr. Ellene Route she states. She  rates her pain 4. Her current exercise regime is walking.  Ms. Belinsky Morphine equivalent is 23.21 MME.  Oral Swab was Performed today.   Ms. Stevick scheduled for Cardioversion on 04/29/2019 by Dr. [name].    Pain Inventory Average Pain 6 Pain Right Now 4 My pain is intermittent, sharp and aching  In the last 24 hours, has pain interfered with the following? General activity 5 Relation with others 3 Enjoyment of life 5 What TIME of day is your pain at its worst? evening Sleep (in general) Fair  Pain is worse with: some activites Pain improves with: rest, pacing activities and medication Relief from Meds: 7  Mobility walk without assistance use a cane ability to climb steps?  yes do you drive?  yes  Function retired  Neuro/Psych weakness numbness  Prior Studies n/a  Physicians involved in your care n/a   Family History  Problem Relation Age of Onset  . Stroke Mother   . Heart attack Father   . Diabetes Brother   . Heart disease Brother    Social History   Socioeconomic History  . Marital status: Married    Spouse name: Not on file  . Number of children: Not on file  . Years of education: Not on file  . Highest education level: Not on file  Occupational History  . Not on file  Social Needs  . Financial resource strain: Not on file  . Food insecurity    Worry: Not on file    Inability: Not on file  . Transportation needs    Medical: Not on file    Non-medical: Not on file  Tobacco Use  . Smoking status: Former Smoker    Packs/day: 1.00    Years:  15.00    Pack years: 15.00  . Smokeless tobacco: Never Used  . Tobacco comment: quit smoking in 1985  Substance and Sexual Activity  . Alcohol use: No    Frequency: Never  . Drug use: No  . Sexual activity: Not Currently    Partners: Male    Birth control/protection: Surgical    Comment: hysterectomy  Lifestyle  . Physical activity    Days per week: Not on file    Minutes per session: Not on file  . Stress: Not on file  Relationships  . Social Herbalist on phone: Not on file    Gets together: Not on file    Attends religious service: Not on file    Active member of club or organization: Not on file    Attends meetings of clubs or organizations: Not on file    Relationship status: Not on file  Other Topics Concern  . Not on file  Social History Narrative  . Not on file   Past Surgical History:  Procedure Laterality Date  . ABDOMINAL HYSTERECTOMY  1982   BSO  . ANTERIOR CERVICAL DECOMP/DISCECTOMY FUSION N/A 10/03/2014   Procedure: ANTERIOR CERVICAL DECOMPRESSION/DISCECTOMY FUSION 1 LEVEL,CERVICAL THREE-FOUR;  Surgeon: [name], MD;  Location: [facility];  Service: Neurosurgery;  Laterality: N/A;  . APPENDECTOMY  1986   along with chole  . BREAST BIOPSY Right 1997   sclerosing adenosis -Dr. Lucia Gaskins  . CHOLECYSTECTOMY    . COLONOSCOPY    . ESOPHAGOGASTRODUODENOSCOPY     with dilitation  . LIPOMA EXCISION Left 10/1999   left thigh  . lumbosacral cyst  2005   seen on injection for low back pain   . LUNG BIOPSY  05/2001   /w Dr.Burney- bronchoscopy, mediastinoscopy  . REVERSE SHOULDER ARTHROPLASTY Left 02/01/2016   Procedure: LEFT REVERSE TOTAL SHOULDER ARTHROPLASTY;  Surgeon: [name], MD;  Location: [facility];  Service: Orthopedics;  Laterality: Left;  . rotator cuff  Bilateral 1996/1998   repair  . ROTATOR CUFF REPAIR Left 1996; 1981  . ROTATOR CUFF REPAIR Right 1996  . TONSILLECTOMY    . TOTAL HIP ARTHROPLASTY Left 10/2000  . TOTAL HIP ARTHROPLASTY  Right 06/2005  . TOTAL HIP ARTHROPLASTY Left    10/2000  . VAGINAL DELIVERY     x1   Past Medical History:  Diagnosis Date  . Asthmatic bronchitis   . Atrial fibrillation ([place]) 2019  . Cataracts, bilateral    immature  . Chronic back pain    arthritis.Stenosis  . Dysphagia   . Dysrhythmia    (?or extra beats) treated /w atenolol  . Emphysema lung ([place])    left  . GERD (gastroesophageal reflux disease)    takes Omeprazole daily  . Hard of hearing    wears hearing aides   . History of blood transfusion    no abnormal reaction noted  . History of gastric ulcer   . Hypercholesteremia    takes Atorvastatin daily  . Hypertension    takes Coreg and Losartan daily  . Hypothyroidism    takes Synthroid daily  . IBS (irritable bowel syndrome)    takes Librax daily  . Joint pain   . Lumbosacral spondylosis without myelopathy   . Melanoma ([place])    on left leg, wide excision   . Muscle spasm    takes Robaxin daily as needed  . Neuromuscular disorder ([place])    esophageal spasms at times   . Osteoarthritis   . Peripheral edema    take HCTZ daily  . Pneumonia    hx of.Many yrs ago  . 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 Seasonal allergies    takes Allegra daily and uses Flonase daily as needed  . Shortness of breath dyspnea    with exertion  . Venous stasis   . Yeast infection    given Diflucan today   LMP 11/03/1980 (Approximate)   Opioid Risk Score:   Fall Risk Score:  `1  Depression screen PHQ 2/9  Depression screen [facility] 2/9 12/14/2018 11/05/2018 09/13/2018 05/13/2018 04/08/2018 03/10/2018 11/23/2017  Decreased Interest 0 0 0 0 - 0 0  Down, Depressed, Hopeless 0 0 0 0 - 0 0  PHQ - 2 Score 0 0 0 0 - 0 0  Altered sleeping - - - - 0 - -  Tired, decreased energy - - - - - - -  Change in appetite - - - - - - -  Feeling bad or failure about yourself  - - - - - - -  Trouble concentrating - - - - - - -  Moving slowly or  fidgety/restless - - - - - - -  Suicidal thoughts - - - - - - -  PHQ-9 Score - - - - - - -  Some recent data might be hidden     Review of Systems     Objective:   Physical Exam Vitals signs and nursing note reviewed.  Constitutional:      Appearance: Normal appearance.  Neck:     Musculoskeletal: Normal range of motion and neck supple.  Cardiovascular:     Rate and Rhythm: Rhythm irregular.     Pulses: Normal pulses.  Pulmonary:     Effort: Pulmonary effort is normal.     Breath sounds: Normal breath sounds.  Musculoskeletal:     Comments: Normal Muscle Bulk and Muscle Testing Reveals:  Upper Extremities: Right: Decreased ROM 90 Degrees and Muscle Strength 5/5  Thoracic Paraspinal Tenderness: T-3-T-7  Lower Extremities: Full ROM and Muscle Strength 5/5 Arises from Table with ease Narrow Based  Gait   Skin:    General: Skin is warm and dry.  Neurological:     Mental Status: She is alert and oriented to person, place, and time.  Psychiatric:        Mood and Affect: Mood normal.        Behavior: Behavior normal.           Assessment & Plan:  1.Chronic low back pain withlumbar spondylosis: Continue with  Slow Weaning: Refilled: Oxycodone. 5mg  one tablet every 8 hours as needed # 65.04/21/2019 We will continue the opioid monitoring program,this consists of regular clinic visits, examinations, urine drug screen, pill counts as well as use of [place] Controlled Substance reporting System. 2. Lumbar Radiculitis: S/PLumbar Radiofrequency with relief noted.Adverse Reaction with Gabapentin and Amitriptyline we willContinue to monitor.04/21/2019 3. Cervicalgia/Cervical Spondylosis/Cervical stenosis with radiculitis/ S/P Anterior Cervical Fusion C3-C4 with Dr. Ellene Route on 10/03/2014.3-level anterior decompression and arthrodesis per [facility] on hold.Continue to Monitor.Dr. Ellene Route Following. 04/21/2019 4. Chronic bilateral shoulder  pain:No complaints today.Continue with Current medication regime.04/21/2019 S/P Left Reverse Total Shoulder Arthroplasty on 02/01/16 via Dr. Veverly Fells. 5. Chronic Right- sided Thoracic Back pain: Continue HEP as Tolerated. Continue to monitor. 04/21/2019. 6. Muscle Spasm:No complaints today.Continueto monitor.04/21/2019.  F/U in 1 month  15 minutes of face to face patient care time was spent during this visit. All questions were encouraged and answered.

## 2019-04-22 NOTE — Telephone Encounter (Signed)
Already taken care of.Spoke to patient cardioversion instructions reviewed and mailed to patient.

## 2019-04-26 ENCOUNTER — Other Ambulatory Visit: Payer: Medicare Other

## 2019-04-26 ENCOUNTER — Encounter: Payer: Self-pay | Admitting: *Deleted

## 2019-04-26 ENCOUNTER — Other Ambulatory Visit (HOSPITAL_COMMUNITY)
Admission: RE | Admit: 2019-04-26 | Discharge: 2019-04-26 | Disposition: A | Discharge status: Home / Self Care | Payer: Medicare Other | Source: Ambulatory Visit | Attending: Cardiology | Admitting: Cardiology

## 2019-04-26 DIAGNOSIS — Z1159 Encounter for screening for other viral diseases: Secondary | ICD-10-CM | POA: Diagnosis present

## 2019-04-26 LAB — SARS CORONAVIRUS 2 (TAT 6-24 HRS): SARS Coronavirus 2: NEGATIVE

## 2019-04-26 NOTE — Progress Notes (Signed)
Covid 19 screen scheduled today for procedure 6/26 at [facility]

## 2019-04-27 ENCOUNTER — Other Ambulatory Visit: Payer: Self-pay | Admitting: *Deleted

## 2019-04-27 LAB — CBC WITH DIFFERENTIAL/PLATELET
Basophils Absolute: 0 10*3/uL (ref 0.0–0.2)
Basos: 1 %
EOS (ABSOLUTE): 0.1 10*3/uL (ref 0.0–0.4)
Eos: 1 %
Hematocrit: 38.4 % (ref 34.0–46.6)
Hemoglobin: 12.9 g/dL (ref 11.1–15.9)
Immature Grans (Abs): 0 10*3/uL (ref 0.0–0.1)
Immature Granulocytes: 0 %
Lymphocytes Absolute: 1.6 10*3/uL (ref 0.7–3.1)
Lymphs: 21 %
MCH: 32.7 pg (ref 26.6–33.0)
MCHC: 33.6 g/dL (ref 31.5–35.7)
MCV: 97 fL (ref 79–97)
Monocytes Absolute: 0.6 10*3/uL (ref 0.1–0.9)
Monocytes: 8 %
Neutrophils Absolute: 5.2 10*3/uL (ref 1.4–7.0)
Neutrophils: 69 %
Platelets: 248 10*3/uL (ref 150–450)
RBC: 3.95 x10E6/uL (ref 3.77–5.28)
RDW: 12.4 % (ref 11.7–15.4)
WBC: 7.6 10*3/uL (ref 3.4–10.8)

## 2019-04-27 LAB — BASIC METABOLIC PANEL
BUN/Creatinine Ratio: 17 (ref 12–28)
BUN: 13 mg/dL (ref 8–27)
CO2: 26 mmol/L (ref 20–29)
Calcium: 9.8 mg/dL (ref 8.7–10.3)
Chloride: 100 mmol/L (ref 96–106)
Creatinine, Ser: 0.78 mg/dL (ref 0.57–1.00)
GFR calc Af Amer: 81 mL/min/{1.73_m2} (ref >59)
GFR calc non Af Amer: 70 mL/min/{1.73_m2} (ref >59)
Glucose: 94 mg/dL (ref 65–99)
Potassium: 5.1 mmol/L (ref 3.5–5.2)
Sodium: 140 mmol/L (ref 134–144)

## 2019-04-27 LAB — DRUG TOX MONITOR 1 W/CONF, ORAL FLD
Alprazolam: NEGATIVE ng/mL (ref <0.50)
Amphetamines: NEGATIVE ng/mL (ref <10)
Barbiturates: NEGATIVE ng/mL (ref <10)
Benzodiazepines: POSITIVE ng/mL — AB (ref <0.50)
Buprenorphine: NEGATIVE ng/mL (ref <0.10)
Chlordiazepoxide: NEGATIVE ng/mL (ref <0.50)
Clonazepam: NEGATIVE ng/mL (ref <0.50)
Cocaine: NEGATIVE ng/mL (ref <5.0)
Codeine: NEGATIVE ng/mL (ref <2.5)
Diazepam: 3.9 ng/mL — ABNORMAL HIGH (ref <0.50)
Dihydrocodeine: NEGATIVE ng/mL (ref <2.5)
Fentanyl: NEGATIVE ng/mL (ref <0.10)
Flunitrazepam: NEGATIVE ng/mL (ref <0.50)
Flurazepam: NEGATIVE ng/mL (ref <0.50)
Heroin Metabolite: NEGATIVE ng/mL (ref <1.0)
Hydrocodone: NEGATIVE ng/mL (ref <2.5)
Hydromorphone: NEGATIVE ng/mL (ref <2.5)
Lorazepam: NEGATIVE ng/mL (ref <0.50)
MARIJUANA: NEGATIVE ng/mL (ref <2.5)
MDMA: NEGATIVE ng/mL (ref <10)
Meprobamate: NEGATIVE ng/mL (ref <2.5)
Methadone: NEGATIVE ng/mL (ref <5.0)
Midazolam: NEGATIVE ng/mL (ref <0.50)
Morphine: NEGATIVE ng/mL (ref <2.5)
Nicotine Metabolite: NEGATIVE ng/mL (ref <5.0)
Nordiazepam: 11.58 ng/mL — ABNORMAL HIGH (ref <0.50)
Norhydrocodone: NEGATIVE ng/mL (ref <2.5)
Noroxycodone: 17 ng/mL — ABNORMAL HIGH (ref <2.5)
Opiates: POSITIVE ng/mL — AB (ref <2.5)
Oxazepam: 0.89 ng/mL — ABNORMAL HIGH (ref <0.50)
Oxycodone: 42.4 ng/mL — ABNORMAL HIGH (ref <2.5)
Oxymorphone: NEGATIVE ng/mL (ref <2.5)
Phencyclidine: NEGATIVE ng/mL (ref <10)
Tapentadol: NEGATIVE ng/mL (ref <5.0)
Temazepam: 0.55 ng/mL — ABNORMAL HIGH (ref <0.50)
Tramadol: NEGATIVE ng/mL (ref <5.0)
Triazolam: NEGATIVE ng/mL (ref <0.50)
Zolpidem: NEGATIVE ng/mL (ref <5.0)

## 2019-04-27 LAB — DRUG TOX ALC METAB W/CON, ORAL FLD: Alcohol Metabolite: NEGATIVE ng/mL (ref <25)

## 2019-04-28 ENCOUNTER — Telehealth: Payer: Self-pay | Admitting: *Deleted

## 2019-04-28 NOTE — Progress Notes (Signed)
Spoke with patient who states that she not showing any signs and symptoms of COVID 19 and states that she has been quarantined since her COVID testing. Jobe Igo, RN

## 2019-04-28 NOTE — Telephone Encounter (Signed)
Oral swab drug screen was consistent for prescribed medications.  ?

## 2019-04-29 ENCOUNTER — Other Ambulatory Visit: Payer: Self-pay

## 2019-04-29 ENCOUNTER — Encounter (HOSPITAL_COMMUNITY)
Admission: RE | Disposition: A | Discharge status: Home / Self Care | Payer: Self-pay | Source: Home / Self Care | Attending: Cardiology

## 2019-04-29 ENCOUNTER — Ambulatory Visit (HOSPITAL_COMMUNITY): Payer: Medicare Other | Admitting: Anesthesiology

## 2019-04-29 ENCOUNTER — Encounter (HOSPITAL_COMMUNITY): Payer: Self-pay | Admitting: Cardiology

## 2019-04-29 ENCOUNTER — Ambulatory Visit (HOSPITAL_COMMUNITY)
Admission: RE | Admit: 2019-04-29 | Discharge: 2019-04-29 | Disposition: A | Discharge status: Home / Self Care | Payer: Medicare Other | Attending: Cardiology | Admitting: Cardiology

## 2019-04-29 DIAGNOSIS — Z1159 Encounter for screening for other viral diseases: Secondary | ICD-10-CM | POA: Insufficient documentation

## 2019-04-29 DIAGNOSIS — J45909 Unspecified asthma, uncomplicated: Secondary | ICD-10-CM | POA: Insufficient documentation

## 2019-04-29 DIAGNOSIS — Z7901 Long term (current) use of anticoagulants: Secondary | ICD-10-CM | POA: Insufficient documentation

## 2019-04-29 DIAGNOSIS — Z79899 Other long term (current) drug therapy: Secondary | ICD-10-CM | POA: Diagnosis not present

## 2019-04-29 DIAGNOSIS — Z886 Allergy status to analgesic agent status: Secondary | ICD-10-CM | POA: Diagnosis not present

## 2019-04-29 DIAGNOSIS — K219 Gastro-esophageal reflux disease without esophagitis: Secondary | ICD-10-CM | POA: Diagnosis not present

## 2019-04-29 DIAGNOSIS — J439 Emphysema, unspecified: Secondary | ICD-10-CM | POA: Insufficient documentation

## 2019-04-29 DIAGNOSIS — Z7951 Long term (current) use of inhaled steroids: Secondary | ICD-10-CM | POA: Diagnosis not present

## 2019-04-29 DIAGNOSIS — K589 Irritable bowel syndrome without diarrhea: Secondary | ICD-10-CM | POA: Insufficient documentation

## 2019-04-29 DIAGNOSIS — I4819 Other persistent atrial fibrillation: Secondary | ICD-10-CM | POA: Insufficient documentation

## 2019-04-29 DIAGNOSIS — E039 Hypothyroidism, unspecified: Secondary | ICD-10-CM | POA: Diagnosis not present

## 2019-04-29 DIAGNOSIS — Z8249 Family history of ischemic heart disease and other diseases of the circulatory system: Secondary | ICD-10-CM | POA: Insufficient documentation

## 2019-04-29 DIAGNOSIS — M1990 Unspecified osteoarthritis, unspecified site: Secondary | ICD-10-CM | POA: Insufficient documentation

## 2019-04-29 DIAGNOSIS — Z885 Allergy status to narcotic agent status: Secondary | ICD-10-CM | POA: Diagnosis not present

## 2019-04-29 DIAGNOSIS — G8929 Other chronic pain: Secondary | ICD-10-CM | POA: Insufficient documentation

## 2019-04-29 DIAGNOSIS — Z881 Allergy status to other antibiotic agents status: Secondary | ICD-10-CM | POA: Insufficient documentation

## 2019-04-29 DIAGNOSIS — I10 Essential (primary) hypertension: Secondary | ICD-10-CM | POA: Insufficient documentation

## 2019-04-29 DIAGNOSIS — M545 Low back pain: Secondary | ICD-10-CM | POA: Diagnosis not present

## 2019-04-29 DIAGNOSIS — Z888 Allergy status to other drugs, medicaments and biological substances status: Secondary | ICD-10-CM | POA: Insufficient documentation

## 2019-04-29 DIAGNOSIS — Z79890 Hormone replacement therapy (postmenopausal): Secondary | ICD-10-CM | POA: Insufficient documentation

## 2019-04-29 DIAGNOSIS — Z87891 Personal history of nicotine dependence: Secondary | ICD-10-CM | POA: Diagnosis not present

## 2019-04-29 DIAGNOSIS — E7800 Pure hypercholesterolemia, unspecified: Secondary | ICD-10-CM | POA: Diagnosis not present

## 2019-04-29 HISTORY — PX: CARDIOVERSION: SHX1299

## 2019-04-29 SURGERY — CARDIOVERSION
Anesthesia: General

## 2019-04-29 MED ORDER — PROPOFOL 10 MG/ML IV BOLUS
INTRAVENOUS | Status: DC | PRN
  Administered 2019-04-29: 70 mg via INTRAVENOUS

## 2019-04-29 MED ORDER — SODIUM CHLORIDE 0.9 % IV SOLN
INTRAVENOUS | Status: DC
  Administered 2019-04-29: 500 mL via INTRAVENOUS

## 2019-04-29 MED ORDER — LIDOCAINE 2% (20 MG/ML) 5 ML SYRINGE
INTRAMUSCULAR | Status: DC | PRN
  Administered 2019-04-29: 60 mg via INTRAVENOUS

## 2019-04-29 NOTE — H&P (Signed)
Telemedicine  04/15/2019 [facility]   [name], [name], MD Cardiology  Other persistent atrial fibrillation +2 more Dx  Referred by Jenna Dy, MD Reason for Visit  Additional Documentation  Vitals:   BP 106/61  Pulse 93  Ht 5' 1.5" (1.562 [name])  Wt 59 kg  LMP 11/03/1980 (Approximate)  BMI 24.17 kg/[name]  BSA 1.6 [name]  Flowsheets:   MEWS Score,  Anthropometrics    Encounter Info:   Billing Info,  History,  Allergies,  Detailed Report    All Notes  Addendum Note by Jenna Salk, LPN at 4/80/1655 3:74 PM Author: Luanna Salk, LPN Author Type: Licensed Practical Nurse Filed: 04/15/2019 6:59 PM  Note Status: Signed Cosign: Cosign Not Required Encounter Date: 04/15/2019  Editor: Jenna Salk, LPN (Licensed Practical Nurse)    Addended by: Jenna [name] on: 04/15/2019 06:59 PM   Modules accepted: Orders     Patient Instructions by Jenna Salk, LPN at 06/29/785 7:54 PM Author: Luanna Salk, LPN Author Type: Licensed Practical Nurse Filed: 04/15/2019 6:59 PM  Note Status: Signed Cosign: Cosign Not Required Encounter Date: 04/15/2019  Editor: Jenna Salk, LPN (Licensed Practical Nurse)     You are scheduled for a Cardioversion on Friday 04/29/19 with Dr.Kayen [name].  Please arrive at the [facility] (Main Entrance A) at [facility]: [address] [place], [place] [address] at 1:15 pm (1 hour prior to procedure )  DIET: Nothing to eat or drink after midnight except a sip of water with medications (see medication instructions below)  Medication Instructions: Hold  Furosemide morning of procedure  Continue your anticoagulant: Eliquis You will need to continue your anticoagulant after your procedure until you            are told by your  Provider that it is safe to stop   Labs: Bmet,cbc to be done at our office [place] Tuesday 04/26/19 at 2:00 pm Then you will need Covid  test at [facility] [facility] at 0:07 pm  You must have a responsible person to drive you home and stay in the waiting area during your procedure. Failure to do so could result in cancellation.  Bring your insurance cards.  *Special Note: Every effort is made to have your procedure done on time. Occasionally there are emergencies that occur at the hospital that may cause delays. Please be patient if a delay does occur.      Progress Notes by [name], [name], MD at 04/15/2019 2:30 PM Author: Martinique, [name], MD Author Type: Physician Filed: 04/15/2019 2:22 PM  Note Status: Signed Cosign: Cosign Not Required Encounter Date: 04/15/2019  Editor: [name], [name], MD (Physician)  Expand All Collapse All      Virtual Visit via Telephone Note   This visit type was conducted due to national recommendations for restrictions regarding the COVID-19 Pandemic (e.g. social distancing) in an effort to limit this patient's exposure and mitigate transmission in our community.  Due to her co-morbid illnesses, this patient is at least at moderate risk for complications without adequate follow up.  This format is felt to be most appropriate for this patient at this time.  The patient did not have access to video technology/had technical difficulties with video requiring transitioning to audio format only (telephone).  All issues noted in this document were discussed and addressed.  No physical exam could be performed with this format.  Please refer to the patient's chart for her  consent to telehealth for [facility].   Date:  04/15/2019   ID:  Jenna [name], DOB 1933/07/31, MRN [id number]  Patient Location: Home Provider Location: Home  PCP:  Jenna Dy, MD    Cardiologist:  Jenna Martinique MD Electrophysiologist:  None   Evaluation Performed:  Follow-Up Visit  Chief Complaint:  Atrial fibrillation  History of Present Illness:    Jenna [name] is a 85 y.o. female with She  has a history of HTN and hypothyroidism.She was seen by Dr. Mancel Bale in January. Noted to be in AFib. Rate 94. Was started on Eliquis. She had prior Echo in October 2016. This showed MAC and grade 1 diastolic dysfunction but was otherwise normal. She is on Coreg for BP and rate control.  She thinks she may have developed Afib as early as September. She was under a lot of stress with her husband having surgery and chemotherapy for lymphoma. She notes her heart skipping and beating hard particularly at night. Notes she is more fatigued and weak. Minimal dyspnea. No chest pain. Notes BP has been running low. No history of CVA or bleeding.She was initially scheduled for DCCV in March but due to the pandemic this was cancelled.  On follow up she notes she is more aware of the Afib particularly at night and in the morning. Feels tired all the times. Notes SOB. She is ready to have DCCV done. She has not missed any Eliquis doses.  The patient does not have symptoms concerning for COVID-19 infection (fever, chills, cough, or new shortness of breath).        Past Medical History:  Diagnosis Date  . Asthmatic bronchitis   . Atrial fibrillation ([place]) 2019  . Cataracts, bilateral    immature  . Chronic back pain    arthritis.Stenosis  . Dysphagia   . Dysrhythmia    (?or extra beats) treated /w atenolol  . Emphysema lung ([place])    left  . GERD (gastroesophageal reflux disease)    takes Omeprazole daily  . Hard of hearing    wears hearing aides   . History of blood transfusion    no abnormal reaction noted  . History of gastric ulcer   . Hypercholesteremia    takes Atorvastatin daily  . Hypertension    takes Coreg and Losartan daily  . Hypothyroidism    takes Synthroid daily  . IBS (irritable bowel syndrome)    takes Librax daily  . Joint pain   . Lumbosacral spondylosis without myelopathy   . Melanoma ([place])    on left leg, wide excision   . Muscle  spasm    takes Robaxin daily as needed  . Neuromuscular disorder ([place])    esophageal spasms at times   . Osteoarthritis   . Peripheral edema    take HCTZ daily  . Pneumonia    hx of.Many yrs ago  . 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 Seasonal allergies    takes Allegra daily and uses Flonase daily as needed  . Shortness of breath dyspnea    with exertion  . Venous stasis   . Yeast infection    given Diflucan today        Past Surgical History:  Procedure Laterality Date  . ABDOMINAL HYSTERECTOMY  1982   BSO  . ANTERIOR CERVICAL DECOMP/DISCECTOMY FUSION N/A 10/03/2014   Procedure: ANTERIOR CERVICAL DECOMPRESSION/DISCECTOMY FUSION  1 LEVEL,CERVICAL THREE-FOUR;  Surgeon: [name], MD;  Location: [place] [facility];  Service: Neurosurgery;  Laterality: N/A;  . APPENDECTOMY  1986   along with chole  . BREAST BIOPSY Right 1997   sclerosing adenosis -Dr. Lucia Gaskins  . CHOLECYSTECTOMY    . COLONOSCOPY    . ESOPHAGOGASTRODUODENOSCOPY     with dilitation  . LIPOMA EXCISION Left 10/1999   left thigh  . lumbosacral cyst  2005   seen on injection for low back pain   . LUNG BIOPSY  05/2001   /w Dr.Burney- bronchoscopy, mediastinoscopy  . REVERSE SHOULDER ARTHROPLASTY Left 02/01/2016   Procedure: LEFT REVERSE TOTAL SHOULDER ARTHROPLASTY;  Surgeon: [name], MD;  Location: [facility];  Service: Orthopedics;  Laterality: Left;  . rotator cuff  Bilateral 1996/1998   repair  . ROTATOR CUFF REPAIR Left 1996; 1981  . ROTATOR CUFF REPAIR Right 1996  . TONSILLECTOMY    . TOTAL HIP ARTHROPLASTY Left 10/2000  . TOTAL HIP ARTHROPLASTY Right 06/2005  . TOTAL HIP ARTHROPLASTY Left    10/2000  . VAGINAL DELIVERY     x1     Active Medications      Current Meds  Medication Sig  . albuterol (PROVENTIL HFA;VENTOLIN HFA) 108 (90 Base) MCG/ACT inhaler Inhale 2 puffs into the lungs every 6 (six) hours as needed  for wheezing or shortness of breath.  [name] amLODipine (NORVASC) 2.5 MG tablet Take 2.5 mg by mouth daily.   [name] apixaban (ELIQUIS) 2.5 MG TABS tablet Take 1 tablet (2.5 mg total) by mouth 2 (two) times daily.  [name] atorvastatin (LIPITOR) 80 MG tablet Take 40 mg by mouth daily.  . Calcium Carbonate-Vitamin D (CALCIUM 600+D PO) Take 1 tablet by mouth daily.  . carvedilol (COREG) 12.5 MG tablet Take 12.5 mg by mouth 2 (two) times daily with a meal.  . cholecalciferol (VITAMIN D) 1000 UNITS tablet Take 1,000 Units by mouth daily.  . diazepam (VALIUM) 5 MG tablet Take 2.5 mg by mouth at bedtime as needed for sedation.   [name] estradiol (CLIMARA) 0.025 mg/24hr patch Place 1 patch (0.025 mg total) onto the skin once a week.  . fexofenadine (ALLEGRA) 180 MG tablet Take 180 mg by mouth daily as needed for allergies.   . fluticasone (FLONASE) 50 MCG/ACT nasal spray Place 2 sprays into both nostrils daily as needed for allergies.   . furosemide (LASIX) 20 MG tablet Take 20 mg by mouth daily.   . hyoscyamine (LEVSIN SL) 0.125 MG SL tablet Place 0.125 mg under the tongue every 4 (four) hours as needed for cramping.   [name] levothyroxine (SYNTHROID, LEVOTHROID) 112 MCG tablet Take 112 mcg by mouth daily before breakfast.  . loratadine (CLARITIN) 10 MG tablet Take 10 mg by mouth daily as needed for allergies.  [name] losartan (COZAAR) 100 MG tablet Take 1 tablet (100 mg total) by mouth daily.  . naphazoline (NAPHCON) 0.1 % ophthalmic solution Place 1 drop into both eyes daily as needed for irritation.  [name] omeprazole (PRILOSEC) 40 MG capsule Take 40 mg by mouth at bedtime.   [name] oxyCODONE (OXY IR/ROXICODONE) 5 MG immediate release tablet Take 1 tablet (5 mg total) by mouth every 8 (eight) hours as needed for moderate pain.  . Probiotic CAPS Take 1 capsule by mouth daily.       Allergies:   Codeine, Pseudoephedrine hcl, Tylenol [acetaminophen], Ultram [tramadol], Vicodin [hydrocodone-acetaminophen], Dalmane [flurazepam hcl],  Nortriptyline, Cephalexin, Clarithromycin, Levofloxacin, and Vibramycin [doxycycline calcium]   Social History  Tobacco Use  . Smoking status: Former Smoker    Packs/day: 1.00    Years: 15.00    Pack years: 15.00  . Smokeless tobacco: Never Used  . Tobacco comment: quit smoking in 1985  Substance Use Topics  . Alcohol use: No    Frequency: Never  . Drug use: No     Family Hx: The patient's family history includes Diabetes in her brother; Heart attack in her father; Heart disease in her brother; Stroke in her mother.  ROS:   Please see the history of present illness.    All other systems reviewed and are negative.   Prior CV studies:   The following studies were reviewed today:  Echo 01/12/19:IMPRESSIONS   1. The left ventricle has mildly reduced systolic function, with an ejection fraction of 45-50%. The cavity size was normal. Left ventricular diastolic function could not be evaluated secondary to atrial fibrillation. Left ventrical global hypokinesis  without regional wall motion abnormalities. 2. The right ventricle has normal systolic function. The cavity was normal. There is no increase in right ventricular wall thickness. 3. Left atrial size was moderately dilated. 4. The aortic valve is tricuspid Moderate thickening of the aortic valve Moderate calcification of the aortic valve. Aortic valve regurgitation was not assessed by color flow Doppler.  Labs/Other Tests and Data Reviewed:    EKG:  No ECG reviewed.  Recent Labs: No results found for requested labs within last 8760 hours.   Recent Lipid Panel Labs (Brief)  No results found for: CHOL, TRIG, HDL, CHOLHDL, LDLCALC, LDLDIRECT       Wt Readings from Last 3 Encounters:  04/15/19 130 lb (59 kg)  03/09/19 130 lb (59 kg)  02/09/19 131 lb (59.4 kg)     Objective:    Vital Signs:  BP 106/61   Pulse 93   Ht 5' 1.5" (1.562 [name])   Wt 130 lb (59 kg)   LMP 11/03/1980  (Approximate)   BMI 24.17 kg/[name]    VITAL SIGNS:  reviewed  ASSESSMENT & PLAN:    1.Atrial fibrillation. Persistent. May have been present for several months? Started in September. She ismildlysymptomatic. Predisposing risk factors for AFib are age, HTN, and thyroid disease. [place] vasc score of 4. Now on Eliquis for over a month. Given age > 46 and low body weight of 60 kg I would recommend reducing Eliquis to 2.5 mg bid.We will plan on DCCV once elective procedures can be done in light of pandemic. 2. HTN controlled.BP is normal. 3. Hypothyroidism. On replacement.TSH OK.  COVID-19 Education: The signs and symptoms of COVID-19 were discussed with the patient and how to seek care for testing (follow up with PCP or arrange E-visit).  The importance of social distancing was discussed today.  Time:   Today, I have spent 15 minutes with the patient with telehealth technology discussing the above problems.     Medication Adjustments/Labs and Tests Ordered: Current medicines are reviewed at length with the patient today.  Concerns regarding medicines are outlined above.   Tests Ordered: No orders of the defined types were placed in this encounter.   Medication Changes: No orders of the defined types were placed in this encounter.   Disposition:  Follow up post cardioversion  Signed, Jenna Martinique, MD  04/15/2019 2:21 PM    [place]; no changes; compliant with apixaban. Jenna [name]

## 2019-04-29 NOTE — Interval H&P Note (Signed)
History and Physical Interval Note:  04/29/2019 1:44 PM  Jenna [name]  has presented today for surgery, with the diagnosis of AFIB.  The various methods of treatment have been discussed with the patient and family. After consideration of risks, benefits and other options for treatment, the patient has consented to  Procedure(s): CARDIOVERSION (N/A) as a surgical intervention.  The patient's history has been reviewed, patient examined, no change in status, stable for surgery.  I have reviewed the patient's chart and labs.  Questions were answered to the patient's satisfaction.     Kirk Ruths

## 2019-04-29 NOTE — Anesthesia Preprocedure Evaluation (Addendum)
Anesthesia Evaluation  Patient identified by MRN, date of birth, ID band Patient awake    Reviewed: Allergy & Precautions, NPO status , Patient's Chart, lab work & pertinent test results, reviewed documented beta blocker date and time   History of Anesthesia Complications Negative for: history of anesthetic complications  Airway Mallampati: II  TM Distance: >3 FB Neck ROM: Limited    Dental  (+) Dental Advisory Given   Pulmonary asthma , COPD, former smoker,   Sarcoidosis    breath sounds clear to auscultation       Cardiovascular hypertension, Pt. on medications and Pt. on home beta blockers + dysrhythmias Atrial Fibrillation  Rhythm:Irregular Rate:Tachycardia   '20 TTE - EF 45-50%. Left ventrical global hypokinesis  without regional wall motion abnormalities. Left atrial size was moderately dilated.    Neuro/Psych negative neurological ROS  negative psych ROS   GI/Hepatic Neg liver ROS, PUD, GERD  Controlled and Medicated, IBS    Endo/Other  Hypothyroidism   Renal/GU negative Renal ROS     Musculoskeletal  (+) Arthritis , Osteoarthritis,    Abdominal   Peds  Hematology negative hematology ROS (+)   Anesthesia Other Findings   Reproductive/Obstetrics                            Anesthesia Physical Anesthesia Plan  ASA: III  Anesthesia Plan: General   Post-op Pain Management:    Induction: Intravenous  PONV Risk Score and Plan: 3 and Treatment may vary due to age or medical condition and Propofol infusion  Airway Management Planned: Mask and Natural Airway  Additional Equipment: None  Intra-op Plan:   Post-operative Plan:   Informed Consent: I have reviewed the patients History and Physical, chart, labs and discussed the procedure including the risks, benefits and alternatives for the proposed anesthesia with the patient or authorized representative who has indicated  his/her understanding and acceptance.       Plan Discussed with: CRNA and Anesthesiologist  Anesthesia Plan Comments:        Anesthesia Quick Evaluation

## 2019-04-29 NOTE — Procedures (Signed)
Electrical Cardioversion Procedure Note Jenna [name] [id number] 1933/03/07  Procedure: Electrical Cardioversion Indications:  Atrial Fibrillation  Procedure Details Consent: Risks of procedure as well as the alternatives and risks of each were explained to the (patient/caregiver).  Consent for procedure obtained. Time Out: Verified patient identification, verified procedure, site/side was marked, verified correct patient position, special equipment/implants available, medications/allergies/relevent history reviewed, required imaging and test results available.  Performed  Patient placed on cardiac monitor, pulse oximetry, supplemental oxygen as necessary.  Sedation given: Pt sedated by anesthesia with lidocaine 60 mg and diprovan 70 mg IV. Pacer pads placed anterior and posterior chest.  Cardioverted 1 time(s).  Cardioverted at 120J.  Evaluation Findings: Post procedure EKG shows: NSR Complications: None Patient did tolerate procedure well.   Kirk Ruths 04/29/2019, 1:42 PM

## 2019-04-29 NOTE — Transfer of Care (Signed)
Immediate Anesthesia Transfer of Care Note  Patient: Jenna [name]  Procedure(s) Performed: CARDIOVERSION (N/A )  Patient Location: Endoscopy Unit  Anesthesia Type:MAC  Level of Consciousness: awake, alert  and oriented  Airway & Oxygen Therapy: Patient Spontanous Breathing  Post-op Assessment: Report given to RN and Post -op Vital signs reviewed and stable  Post vital signs: Reviewed and stable  Last Vitals:  Vitals Value Taken Time  BP 104/73 04/29/19 1432  Temp    Pulse 85 04/29/19 1433  Resp 22 04/29/19 1433  SpO2 100 % 04/29/19 1433    Last Pain:  Vitals:   04/29/19 1359  TempSrc: Oral  PainSc:          Complications: No apparent anesthesia complications

## 2019-04-29 NOTE — Anesthesia Procedure Notes (Signed)
Procedure Name: MAC Date/Time: 04/29/2019 2:25 PM Performed by: Marsa Aris, CRNA Pre-anesthesia Checklist: Patient identified, Emergency Drugs available, Suction available, Patient being monitored and Timeout performed Patient Re-evaluated:Patient Re-evaluated prior to induction Oxygen Delivery Method: Ambu bag Preoxygenation: Pre-oxygenation with 100% oxygen Induction Type: IV induction

## 2019-04-29 NOTE — Anesthesia Postprocedure Evaluation (Signed)
Anesthesia Post Note  Patient: Jenna [name]  Procedure(s) Performed: CARDIOVERSION (N/A )     Patient location during evaluation: PACU Anesthesia Type: General Level of consciousness: awake and alert Pain management: pain level controlled Vital Signs Assessment: post-procedure vital signs reviewed and stable Respiratory status: spontaneous breathing, nonlabored ventilation and respiratory function stable Cardiovascular status: blood pressure returned to baseline and stable Postop Assessment: no apparent nausea or vomiting Anesthetic complications: no    Last Vitals:  Vitals:   04/29/19 1435 04/29/19 1445  BP: (!) 125/55 (!) 147/74  Pulse: 82 (!) 51  Resp: 18 10  Temp: 36.9 C   SpO2: 100% 99%    Last Pain:  Vitals:   04/29/19 1435  TempSrc: Oral  PainSc: 0-No pain                 Audry Pili

## 2019-04-29 NOTE — Discharge Instructions (Signed)
Electrical Cardioversion, Care After °This sheet gives you information about how to care for yourself after your procedure. Your health care provider may also give you more specific instructions. If you have problems or questions, contact your health care provider. °What can I expect after the procedure? °After the procedure, it is common to have: °· Some redness on the skin where the shocks were given. °Follow these instructions at home: ° °· Do not drive for 24 hours if you were given a medicine to help you relax (sedative). °· Take over-the-counter and prescription medicines only as told by your health care provider. °· Ask your health care provider how to check your pulse. Check it often. °· Rest for 48 hours after the procedure or as told by your health care provider. °· Avoid or limit your caffeine use as told by your health care provider. °Contact a health care provider if: °· You feel like your heart is beating too quickly or your pulse is not regular. °· You have a serious muscle cramp that does not go away. °Get help right away if: ° °· You have discomfort in your chest. °· You are dizzy or you feel faint. °· You have trouble breathing or you are short of breath. °· Your speech is slurred. °· You have trouble moving an arm or leg on one side of your body. °· Your fingers or toes turn cold or blue. °This information is not intended to replace advice given to you by your health care provider. Make sure you discuss any questions you have with your health care provider. °Document Released: 08/10/2013 Document Revised: 05/23/2016 Document Reviewed: 04/25/2016 °Elsevier Interactive Patient Education © 2019 Elsevier Inc. ° °

## 2019-05-01 ENCOUNTER — Encounter (HOSPITAL_COMMUNITY): Payer: Self-pay | Admitting: Cardiology

## 2019-05-12 ENCOUNTER — Ambulatory Visit (INDEPENDENT_AMBULATORY_CARE_PROVIDER_SITE_OTHER): Payer: Medicare Other | Admitting: Cardiology

## 2019-05-12 ENCOUNTER — Other Ambulatory Visit: Payer: Self-pay

## 2019-05-12 ENCOUNTER — Encounter: Payer: Self-pay | Admitting: Cardiology

## 2019-05-12 DIAGNOSIS — I480 Paroxysmal atrial fibrillation: Secondary | ICD-10-CM | POA: Diagnosis not present

## 2019-05-12 DIAGNOSIS — I10 Essential (primary) hypertension: Secondary | ICD-10-CM | POA: Insufficient documentation

## 2019-05-12 DIAGNOSIS — E039 Hypothyroidism, unspecified: Secondary | ICD-10-CM | POA: Diagnosis not present

## 2019-05-12 DIAGNOSIS — Z7901 Long term (current) use of anticoagulants: Secondary | ICD-10-CM | POA: Insufficient documentation

## 2019-05-12 MED ORDER — AMIODARONE HCL 200 MG PO TABS: ORAL_TABLET | ORAL | 0 refills | Status: DC

## 2019-05-12 NOTE — Patient Instructions (Addendum)
Medication Instructions:  START Amidarone 200mg  Take 1 tablet twice a day for 10 days then Take one tablet once a day  If you need a refill on your cardiac medications before your next appointment, please call your pharmacy.   Lab work: None  If you have labs (blood work) drawn today and your tests are completely normal, you will receive your results only by: [name] MyChart Message (if you have MyChart) OR . A paper copy in the mail If you have any lab test that is abnormal or we need to change your treatment, we will call you to review the results.  Testing/Procedures: None   Follow-Up: At [facility], you and your health needs are our priority.  As part of our continuing mission to provide you with exceptional heart care, we have created designated Provider Care Teams.  These Care Teams include your primary Cardiologist (physician) and Advanced Practice Providers (APPs -  Physician Assistants and Nurse Practitioners) who all work together to provide you with the care you need, when you need it.  . Your physician recommends that you schedule a follow-up appointment in: 3 weeks with Kerin Ransom, PA-C  Any Other Special Instructions Will Be Listed Below (If Applicable).

## 2019-05-12 NOTE — Assessment & Plan Note (Signed)
Diagnosed in Jan 2020 but may have been present since Sept 2020- EF 45-50%, mod LAE.  S/P elective OP DCCV 04/27/2019. She is back in AF today.

## 2019-05-12 NOTE — Assessment & Plan Note (Signed)
On Synthroid-Dr Mancel Bale follows

## 2019-05-12 NOTE — Assessment & Plan Note (Signed)
Controlled-EF 45-50% (in AF) with moderate LAE March 2020

## 2019-05-12 NOTE — Assessment & Plan Note (Signed)
CHADS VASC=4, low dose Eliquis secondary to wgt and age

## 2019-05-12 NOTE — Progress Notes (Addendum)
Cardiology Office Note:    Date:  05/12/2019   ID:  Jenna [name], DOB Jan 14, 1933, MRN [id number]  PCP:  [name], MD  Cardiologist:  [name] Electrophysiologist:  None   Referring MD: [name], MD   palpitations  History of Present Illness:    Jenna [name] is a 85 y.o. female with a hx of atrial fibrillation which was first noticed in January 2020.  The patient reports she thinks it may have actually been present since September 2019 when she noticed fatigue.  She had been under a great deal of stress, her husband was ill at that time.  Her other medical issues include hypertension, and treated hypothyroidism.  She was placed on Eliquis, her CHA DS VASC is 4.  She is on 2.5 mg twice daily secondary to her age and BMI.  Echocardiogram January 12, 2019 showed an ejection fraction of 45 to 50% with moderate left atrial enlargement.  Her cardioversion was delayed secondary to the [place] V[place] pandemic.  She ultimately underwent outpatient DC cardioversion 04/29/2019.  She converted to sinus rhythm.  She said she did well until this morning when she felt fatigue and palpitations.  She was unable to get a blood pressure reading on her blood pressure cuff and suspect that she was back in atrial fibrillation.  EKG in the office today confirms this.  Past Medical History:  Diagnosis Date  . Asthmatic bronchitis   . Atrial fibrillation ([place]) 2019  . Cataracts, bilateral    immature  . Chronic back pain    arthritis.Stenosis  . Dysphagia   . Dysrhythmia    (?or extra beats) treated /w atenolol  . Emphysema lung ([place])    left  . GERD (gastroesophageal reflux disease)    takes Omeprazole daily  . Hard of hearing    wears hearing aides   . History of blood transfusion    no abnormal reaction noted  . History of gastric ulcer   . Hypercholesteremia    takes Atorvastatin daily  . Hypertension    takes Coreg and Losartan daily  . Hypothyroidism    takes Synthroid daily  . IBS  (irritable bowel syndrome)    takes Librax daily  . Joint pain   . Lumbosacral spondylosis without myelopathy   . Melanoma ([place])    on left leg, wide excision   . Muscle spasm    takes Robaxin daily as needed  . Neuromuscular disorder ([place])    esophageal spasms at times   . Osteoarthritis   . Peripheral edema    take HCTZ daily  . Pneumonia    hx of.Many yrs ago  . 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 Seasonal allergies    takes Allegra daily and uses Flonase daily as needed  . Shortness of breath dyspnea    with exertion  . Venous stasis   . Yeast infection    given Diflucan today    Past Surgical History:  Procedure Laterality Date  . ABDOMINAL HYSTERECTOMY  1982   BSO  . ANTERIOR CERVICAL DECOMP/DISCECTOMY FUSION N/A 10/03/2014   Procedure: ANTERIOR CERVICAL DECOMPRESSION/DISCECTOMY FUSION 1 LEVEL,CERVICAL THREE-FOUR;  Surgeon: [name], MD;  Location: [facility]S;  Service: Neurosurgery;  Laterality: N/A;  . APPENDECTOMY  1986   along with chole  . BREAST BIOPSY Right 1997   sclerosing adenosis -Dr. Lucia Gaskins  . CARDIOVERSION N/A 04/29/2019   Procedure: CARDIOVERSION;  Surgeon: [name],  [name], MD;  Location: [facility] ENDOSCOPY;  Service: Cardiovascular;  Laterality: N/A;  . CHOLECYSTECTOMY    . COLONOSCOPY    . ESOPHAGOGASTRODUODENOSCOPY     with dilitation  . LIPOMA EXCISION Left 10/1999   left thigh  . lumbosacral cyst  2005   seen on injection for low back pain   . LUNG BIOPSY  05/2001   /w Dr.Burney- bronchoscopy, mediastinoscopy  . REVERSE SHOULDER ARTHROPLASTY Left 02/01/2016   Procedure: LEFT REVERSE TOTAL SHOULDER ARTHROPLASTY;  Surgeon: [name], MD;  Location: [facility];  Service: Orthopedics;  Laterality: Left;  . rotator cuff  Bilateral 1996/1998   repair  . ROTATOR CUFF REPAIR Left 1996; 1981  . ROTATOR CUFF REPAIR Right 1996  . TONSILLECTOMY    . TOTAL HIP ARTHROPLASTY Left 10/2000  . TOTAL HIP  ARTHROPLASTY Right 06/2005  . TOTAL HIP ARTHROPLASTY Left    10/2000  . VAGINAL DELIVERY     x1    Current Medications: Current Meds  Medication Sig  . albuterol (PROVENTIL HFA;VENTOLIN HFA) 108 (90 Base) MCG/ACT inhaler Inhale 2 puffs into the lungs every 6 (six) hours as needed for wheezing or shortness of breath.  [name] amLODipine (NORVASC) 2.5 MG tablet Take 2.5 mg by mouth daily.   [name] apixaban (ELIQUIS) 2.5 MG TABS tablet Take 1 tablet (2.5 mg total) by mouth 2 (two) times daily.  [name] atorvastatin (LIPITOR) 20 MG tablet Take 20 mg by mouth daily.  . Calcium Carbonate-Vitamin D (CALCIUM 600+D PO) Take 1 tablet by mouth daily.  . carvedilol (COREG) 12.5 MG tablet Take 12.5 mg by mouth 2 (two) times daily with a meal.  . cholecalciferol (VITAMIN D) 1000 UNITS tablet Take 1,000 Units by mouth daily.  . clidinium-chlordiazePOXIDE (LIBRAX) 5-2.5 MG capsule chlordiazepoxide-clidinium 5 mg-2.5 mg capsule  TAKE 1 CAPSULE BY MOUTH EVERY DAY AS DIRECTED  . diazepam (VALIUM) 5 MG tablet Take 2.5 mg by mouth at bedtime as needed for sedation.   . Eflornithine HCl (VANIQA) 13.9 % cream Vaniqa 13.9 % topical cream  APPLY TO AFFECTED AREA TWICE A DAY AS DIRECTED  . fluconazole (DIFLUCAN) 150 MG tablet fluconazole 150 mg tablet  . fluticasone (FLONASE) 50 MCG/ACT nasal spray Place 2 sprays into both nostrils daily.   . furosemide (LASIX) 20 MG tablet Take 20 mg by mouth daily.   . hyoscyamine (LEVSIN SL) 0.125 MG SL tablet Place 0.125 mg under the tongue every 4 (four) hours as needed for cramping.   [name] ketoconazole (NIZORAL) 2 % shampoo ketoconazole  2 % sham  . levothyroxine (SYNTHROID, LEVOTHROID) 112 MCG tablet Take 112 mcg by mouth daily before breakfast.  . losartan (COZAAR) 100 MG tablet Take 1 tablet (100 mg total) by mouth daily.  . naphazoline-pheniramine (NAPHCON-A) 0.025-0.3 % ophthalmic solution Place 1 drop into both eyes 4 (four) times daily as needed for eye irritation.  [name] omeprazole  (PRILOSEC) 40 MG capsule Take 40 mg by mouth at bedtime.   [name] oxyCODONE (OXY IR/ROXICODONE) 5 MG immediate release tablet Take 1 tablet (5 mg total) by mouth every 8 (eight) hours as needed for moderate pain. (Patient taking differently: Take 2.5-5 mg by mouth every 8 (eight) hours as needed for moderate pain. )     Allergies:   Codeine, Pseudoephedrine hcl, Tylenol [acetaminophen], Ultram [tramadol], Vicodin [hydrocodone-acetaminophen], Dalmane [flurazepam hcl], Nortriptyline, Cephalexin, Clarithromycin, Levofloxacin, and Vibramycin [doxycycline calcium]   Social History   Socioeconomic History  . Marital status: Married    Spouse name: Not  on file  . Number of children: Not on file  . Years of education: Not on file  . Highest education level: Not on file  Occupational History  . Not on file  Social Needs  . Financial resource strain: Not on file  . Food insecurity    Worry: Not on file    Inability: Not on file  . Transportation needs    Medical: Not on file    Non-medical: Not on file  Tobacco Use  . Smoking status: Former Smoker    Packs/day: 1.00    Years: 15.00    Pack years: 15.00  . Smokeless tobacco: Never Used  . Tobacco comment: quit smoking in 1985  Substance and Sexual Activity  . Alcohol use: No    Frequency: Never  . Drug use: No  . Sexual activity: Not Currently    Partners: Male    Birth control/protection: Surgical    Comment: hysterectomy  Lifestyle  . Physical activity    Days per week: Not on file    Minutes per session: Not on file  . Stress: Not on file  Relationships  . Social Herbalist on phone: Not on file    Gets together: Not on file    Attends religious service: Not on file    Active member of club or organization: Not on file    Attends meetings of clubs or organizations: Not on file    Relationship status: Not on file  Other Topics Concern  . Not on file  Social History Narrative  . Not on file     Family  History: The patient's family history includes Diabetes in her brother; Heart attack in her father; Heart disease in her brother; Stroke in her mother.  ROS:   Please see the history of present illness.  All other systems reviewed and are negative.  EKGs/Labs/Other Studies Reviewed:    The following studies were reviewed today: Echo 01/12/2019  EKG:  EKG is ordered today.  The ekg ordered today demonstrates atrial fibrillation with VR 110, poor anterior RW  Recent Labs: 04/26/2019: BUN 13; Creatinine, Ser 0.78; Hemoglobin 12.9; Platelets 248; Potassium 5.1; Sodium 140  Recent Lipid Panel No results found for: CHOL, TRIG, HDL, CHOLHDL, VLDL, LDLCALC, LDLDIRECT  Physical Exam:    VS:  BP 124/64   Pulse (!) 114   Temp (!) 95.2 F (35.1 C)   Ht 5' 2.5" (1.588 m)   Wt 129 lb 9.6 oz (58.8 kg)   LMP 11/03/1980 (Approximate)   BMI 23.33 kg/m     Wt Readings from Last 3 Encounters:  05/12/19 129 lb 9.6 oz (58.8 kg)  04/29/19 132 lb (59.9 kg)  04/21/19 135 lb (61.2 kg)     GEN:  Well nourished, well developed in no acute distress HEENT: Normal NECK: No JVD; No carotid bruits LYMPHATICS: No lymphadenopathy CARDIAC: irregularly irregular, no murmurs, rubs, gallops RESPIRATORY:  Clear to auscultation without rales, wheezing or rhonchi  ABDOMEN: Soft, non-tender, non-distended MUSCULOSKELETAL:  No edema; No deformity  SKIN: Warm and dry NEUROLOGIC:  Alert and oriented x 3 PSYCHIATRIC:  Normal affect   ASSESSMENT:    PAF (paroxysmal atrial fibrillation) ([place]) Diagnosed in Jan 2020 but may have been present since Sept 2020- EF 45-50%, mod LAE.  S/P elective OP DCCV 04/27/2019. She is back in AF today.  Essential hypertension Controlled-EF 45-50% (in AF) with moderate LAE March 2020  Chronic anticoagulation CHADS VASC=4, low dose Eliquis secondary to  wgt and age  Hypothyroidism On Synthroid-Dr Mancel Bale follows  PLAN:    Discussed with [name]- add Amiodarone 200 mg BID  x 10 days, then 200 mg daily.  I'll see her back in 3 weeks, plan OP DCCV if she remains in AF.  I'll see if I can get her last TSH and LFTs from Dr Mancel Bale.   Addendum: Labs from PCP obtained- LFTS WNL Jan 2020, TSH was 5.0.  She will need f/u CMET and TSH in 6-8 weeks if she remains on Amiodarone.   Kerin Ransom PA-C 05/12/2019 4:34 PM    Medication Adjustments/Labs and Tests Ordered: Current medicines are reviewed at length with the patient today.  Concerns regarding medicines are outlined above.  No orders of the defined types were placed in this encounter.  No orders of the defined types were placed in this encounter.   There are no Patient Instructions on file for this visit.   Signed, Kerin Ransom, PA-C  05/12/2019 2:05 PM    [facility]

## 2019-05-24 ENCOUNTER — Other Ambulatory Visit: Payer: Self-pay

## 2019-05-24 ENCOUNTER — Encounter: Payer: Medicare Other | Attending: Physical Medicine and Rehabilitation | Admitting: Registered Nurse

## 2019-05-24 ENCOUNTER — Encounter: Payer: Self-pay | Admitting: Registered Nurse

## 2019-05-24 VITALS — BP 114/70 | HR 75 | Temp 97.8°F | Ht 62.5 in | Wt 132.0 lb

## 2019-05-24 DIAGNOSIS — Z79899 Other long term (current) drug therapy: Secondary | ICD-10-CM | POA: Insufficient documentation

## 2019-05-24 DIAGNOSIS — M47816 Spondylosis without myelopathy or radiculopathy, lumbar region: Secondary | ICD-10-CM | POA: Diagnosis present

## 2019-05-24 DIAGNOSIS — M47812 Spondylosis without myelopathy or radiculopathy, cervical region: Secondary | ICD-10-CM | POA: Diagnosis present

## 2019-05-24 DIAGNOSIS — M4712 Other spondylosis with myelopathy, cervical region: Secondary | ICD-10-CM

## 2019-05-24 DIAGNOSIS — Z5181 Encounter for therapeutic drug level monitoring: Secondary | ICD-10-CM | POA: Insufficient documentation

## 2019-05-24 DIAGNOSIS — M5412 Radiculopathy, cervical region: Secondary | ICD-10-CM | POA: Diagnosis not present

## 2019-05-24 DIAGNOSIS — M47817 Spondylosis without myelopathy or radiculopathy, lumbosacral region: Secondary | ICD-10-CM

## 2019-05-24 DIAGNOSIS — M12811 Other specific arthropathies, not elsewhere classified, right shoulder: Secondary | ICD-10-CM | POA: Diagnosis present

## 2019-05-24 DIAGNOSIS — M12812 Other specific arthropathies, not elsewhere classified, left shoulder: Secondary | ICD-10-CM | POA: Insufficient documentation

## 2019-05-24 DIAGNOSIS — G894 Chronic pain syndrome: Secondary | ICD-10-CM

## 2019-05-24 DIAGNOSIS — M546 Pain in thoracic spine: Secondary | ICD-10-CM

## 2019-05-24 DIAGNOSIS — Z981 Arthrodesis status: Secondary | ICD-10-CM | POA: Diagnosis not present

## 2019-05-24 DIAGNOSIS — G8929 Other chronic pain: Secondary | ICD-10-CM

## 2019-05-24 DIAGNOSIS — M542 Cervicalgia: Secondary | ICD-10-CM

## 2019-05-24 DIAGNOSIS — Z79891 Long term (current) use of opiate analgesic: Secondary | ICD-10-CM

## 2019-05-24 NOTE — Progress Notes (Signed)
Subjective:    Patient ID: Jenna [name], female    DOB: [birth year], 85 y.o.   MRN: [id number]  HPI: Jenna [name] is a 85 y.o. female who returns for follow up appointment for chronic pain and medication refill. She states her pain is located in her neck radiating into her right shoulder ( S/P cortisone injection by Dr. Veverly [name] two weeks ago) and mid- lower back pain. She rates her pain 3. Her current exercise regime is walking.   Jenna [name] Morphine equivalent is 23.21 MME.  Last Oral Swab was Performed on 04/21/2019, it was consistent.   Pain Inventory Average Pain 5 Pain Right Now 3 My pain is intermittent, sharp and aching  In the last 24 hours, has pain interfered with the following? General activity 4 Relation with others 3 Enjoyment of life 3 What TIME of day is your pain at its worst? daytime Sleep (in general) Good  Pain is worse with: standing and some activites Pain improves with: rest, pacing activities and medication Relief from Meds: 6  Mobility walk without assistance use a cane ability to climb steps?  yes do you drive?  yes  Function retired  Neuro/Psych weakness trouble walking  Prior Studies Any changes since last visit?  no  Physicians involved in your care Any changes since last visit?  no   Family History  Problem Relation Age of Onset  . Stroke Mother   . Heart attack Father   . Diabetes Brother   . Heart disease Brother    Social History   Socioeconomic History  . Marital status: Married    Spouse name: Not on file  . Number of children: Not on file  . Years of education: Not on file  . Highest education level: Not on file  Occupational History  . Not on file  Social Needs  . Financial resource strain: Not on file  . Food insecurity    Worry: Not on file    Inability: Not on file  . Transportation needs    Medical: Not on file    Non-medical: Not on file  Tobacco Use  . Smoking status: Former Smoker    Packs/day:  1.00    Years: 15.00    Pack years: 15.00  . Smokeless tobacco: Never Used  . Tobacco comment: quit smoking in 1985  Substance and Sexual Activity  . Alcohol use: No    Frequency: Never  . Drug use: No  . Sexual activity: Not Currently    Partners: Male    Birth control/protection: Surgical    Comment: hysterectomy  Lifestyle  . Physical activity    Days per week: Not on file    Minutes per session: Not on file  . Stress: Not on file  Relationships  . Social Herbalist on phone: Not on file    Gets together: Not on file    Attends religious service: Not on file    Active member of club or organization: Not on file    Attends meetings of clubs or organizations: Not on file    Relationship status: Not on file  Other Topics Concern  . Not on file  Social History Narrative  . Not on file   Past Surgical History:  Procedure Laterality Date  . ABDOMINAL HYSTERECTOMY  1982   BSO  . ANTERIOR CERVICAL DECOMP/DISCECTOMY FUSION N/A 10/03/2014   Procedure: ANTERIOR CERVICAL DECOMPRESSION/DISCECTOMY FUSION 1 LEVEL,CERVICAL THREE-FOUR;  Surgeon: Jenna Miss, MD;  Location:  [facility] ORS;  Service: Neurosurgery;  Laterality: N/A;  . APPENDECTOMY  1986   along with chole  . BREAST BIOPSY Right 1997   sclerosing adenosis -Dr. Lucia [name]  . CARDIOVERSION N/A 04/29/2019   Procedure: CARDIOVERSION;  Surgeon: Jenna Perla, MD;  Location: [facility] ENDOSCOPY;  Service: Cardiovascular;  Laterality: N/A;  . CHOLECYSTECTOMY    . COLONOSCOPY    . ESOPHAGOGASTRODUODENOSCOPY     with dilitation  . LIPOMA EXCISION Left 10/1999   left thigh  . lumbosacral cyst  2005   seen on injection for low back pain   . LUNG BIOPSY  05/2001   /w Dr.Burney- bronchoscopy, mediastinoscopy  . REVERSE SHOULDER ARTHROPLASTY Left 02/01/2016   Procedure: LEFT REVERSE TOTAL SHOULDER ARTHROPLASTY;  Surgeon: Jenna Cedars, MD;  Location: [facility];  Service: Orthopedics;  Laterality: Left;  . rotator cuff  Bilateral  1996/1998   repair  . ROTATOR CUFF REPAIR Left 1996; 1981  . ROTATOR CUFF REPAIR Right 1996  . TONSILLECTOMY    . TOTAL HIP ARTHROPLASTY Left 10/2000  . TOTAL HIP ARTHROPLASTY Right 06/2005  . TOTAL HIP ARTHROPLASTY Left    10/2000  . VAGINAL DELIVERY     x1   Past Medical History:  Diagnosis Date  . Asthmatic bronchitis   . Atrial fibrillation ([place]) 2019  . Cataracts, bilateral    immature  . Chronic back pain    arthritis.Stenosis  . Dysphagia   . Dysrhythmia    (?or extra beats) treated /w atenolol  . Emphysema lung ([place])    left  . GERD (gastroesophageal reflux disease)    takes Omeprazole daily  . Hard of hearing    wears hearing aides   . History of blood transfusion    no abnormal reaction noted  . History of gastric ulcer   . Hypercholesteremia    takes Atorvastatin daily  . Hypertension    takes Coreg and Losartan daily  . Hypothyroidism    takes Synthroid daily  . IBS (irritable bowel syndrome)    takes Librax daily  . Joint pain   . Lumbosacral spondylosis without myelopathy   . Melanoma ([place])    on left leg, wide excision   . Muscle spasm    takes Robaxin daily as needed  . Neuromuscular disorder ([place])    esophageal spasms at times   . Osteoarthritis   . Peripheral edema    take HCTZ daily  . Pneumonia    hx of.Many yrs ago  . 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 Seasonal allergies    takes Allegra daily and uses Flonase daily as needed  . Shortness of breath dyspnea    with exertion  . Venous stasis   . Yeast infection    given Diflucan today   BP 114/70   Pulse 75   Temp 97.8 F (36.6 C)   Ht 5' 2.5" (1.588 m)   Wt 132 lb (59.9 kg)   LMP 11/03/1980 (Approximate)   SpO2 95%   BMI 23.76 kg/m   Opioid Risk Score:   Fall Risk Score:  `1  Depression screen PHQ 2/9  Depression screen [facility] 2/9 12/14/2018 11/05/2018 09/13/2018 05/13/2018 04/08/2018 03/10/2018 11/23/2017  Decreased Interest 0 0  0 0 - 0 0  Down, Depressed, Hopeless 0 0 0 0 - 0 0  PHQ - 2 Score 0 0 0 0 - 0 0  Altered sleeping - - - - 0 - -  Tired, decreased energy - - - - - - -  Change in appetite - - - - - - -  Feeling bad or failure about yourself  - - - - - - -  Trouble concentrating - - - - - - -  Moving slowly or fidgety/restless - - - - - - -  Suicidal thoughts - - - - - - -  PHQ-9 Score - - - - - - -  Some recent data might be hidden     Review of Systems  Constitutional: Negative.   HENT: Negative.   Eyes: Negative.   Respiratory: Negative.   Cardiovascular: Negative.   Gastrointestinal: Negative.   Endocrine: Negative.   Genitourinary: Negative.   Musculoskeletal: Positive for arthralgias, gait problem and myalgias.  Skin: Negative.   Allergic/Immunologic: Negative.   Neurological: Positive for weakness.  Hematological: Negative.   Psychiatric/Behavioral: Negative.   All other systems reviewed and are negative.      Objective:   Physical Exam Vitals signs and nursing note reviewed.  Constitutional:      Appearance: Normal appearance.  Neck:     Musculoskeletal: Normal range of motion and neck supple.  Cardiovascular:     Rate and Rhythm: Normal rate and regular rhythm.     Pulses: Normal pulses.     Heart sounds: Normal heart sounds.  Pulmonary:     Effort: Pulmonary effort is normal.     Breath sounds: Normal breath sounds.  Musculoskeletal:     Comments: Normal Muscle Bulk and Muscle Testing Reveals:  Upper Extremities:Full  ROM and Muscle Strength 5/5 Right AC Joint Tenderness  Thoracic Paraspinal Tenderness: T-6-T-7  Lower Extremities : Full ROM and Muscle Strength 5/5 Arises from chair with ease using cane for support Narrow Based Gait   Skin:    General: Skin is warm and dry.  Neurological:     Mental Status: She is alert and oriented to person, place, and time.  Psychiatric:        Mood and Affect: Mood normal.        Behavior: Behavior normal.            Assessment & Plan:  1.Chronic low back pain withlumbar spondylosis: Continue withSlow Weaning:Oxycodone. 5mg  one tablet every 8 hours as needed # 65.05/24/2019 We will continue the opioid monitoring program,this consists of regular clinic visits, examinations, urine drug screen, pill counts as well as use of [place] Controlled Substance reporting System. 2. Lumbar Radiculitis: S/PLumbar Radiofrequency with relief noted.Adverse Reaction with Gabapentin and Amitriptyline we willContinue to monitor.05/24/2019 3. Cervicalgia/Cervical Spondylosis/Cervical stenosis with radiculitis/ S/P Anterior Cervical Fusion C3-C4 with Dr. Ellene Route on 10/03/2014.3-level anterior decompression and arthrodesis per [facility] on hold.Continue to Monitor.Dr. Ellene Route Following. 05/24/2019 4. Chronic bilateral shoulder pain:No complaints today.Continue with Current medication regime.05/24/2019 S/P Left Reverse Total Shoulder Arthroplasty on 02/01/16 via Dr. Veverly [name]. 5. Chronic Right- sided Thoracic Back pain: Continue HEP as Tolerated. Continue to monitor.05/24/2019. 6. Muscle Spasm:No complaints today.Continueto monitor.05/24/2019.  F/U in 1 month  15 minutes of face to face patient care time was spent during this visit. All questions were encouraged and answered.

## 2019-05-24 NOTE — Patient Instructions (Signed)
Please Call Zella Ball  In August between 06/14/19- 06/16/2019, regarding your Oxycodone.

## 2019-05-26 ENCOUNTER — Other Ambulatory Visit: Payer: Self-pay | Admitting: Cardiology

## 2019-05-27 NOTE — Telephone Encounter (Signed)
Rx(s) sent to pharmacy electronically.  

## 2019-06-01 ENCOUNTER — Ambulatory Visit (INDEPENDENT_AMBULATORY_CARE_PROVIDER_SITE_OTHER): Payer: Medicare Other | Admitting: Cardiology

## 2019-06-01 ENCOUNTER — Telehealth: Payer: Self-pay | Admitting: Cardiology

## 2019-06-01 ENCOUNTER — Other Ambulatory Visit: Payer: Self-pay

## 2019-06-01 ENCOUNTER — Encounter: Payer: Self-pay | Admitting: Cardiology

## 2019-06-01 VITALS — BP 120/62 | HR 67 | Ht 62.5 in | Wt 132.0 lb

## 2019-06-01 DIAGNOSIS — Z7901 Long term (current) use of anticoagulants: Secondary | ICD-10-CM

## 2019-06-01 DIAGNOSIS — E039 Hypothyroidism, unspecified: Secondary | ICD-10-CM

## 2019-06-01 DIAGNOSIS — I10 Essential (primary) hypertension: Secondary | ICD-10-CM | POA: Diagnosis not present

## 2019-06-01 DIAGNOSIS — R5383 Other fatigue: Secondary | ICD-10-CM

## 2019-06-01 DIAGNOSIS — I480 Paroxysmal atrial fibrillation: Secondary | ICD-10-CM

## 2019-06-01 MED ORDER — CARVEDILOL 6.25 MG PO TABS: 6.2500 mg | ORAL_TABLET | Freq: Two times a day (BID) | ORAL | 2 refills | Status: DC

## 2019-06-01 NOTE — Patient Instructions (Signed)
Medication Instructions:  DECREASE Coreg to 6.25mg  Take 1 tablet twice a day  If you need a refill on your cardiac medications before your next appointment, please call your pharmacy.   Lab work: Your physician recommends that you return for lab work in: 2 months CMET, TSH  If you have labs (blood work) drawn today and your tests are completely normal, you will receive your results only by: [name] MyChart Message (if you have MyChart) OR . A paper copy in the mail If you have any lab test that is abnormal or we need to change your treatment, we will call you to review the results.  Testing/Procedures: NONE   Follow-Up: At [facility], you and your health needs are our priority.  As part of our continuing mission to provide you with exceptional heart care, we have created designated Provider Care Teams.  These Care Teams include your primary Cardiologist (physician) and Advanced Practice Providers (APPs -  Physician Assistants and Nurse Practitioners) who all work together to provide you with the care you need, when you need it. You will need a follow up appointment in 3 months.  Please call our office 2 months in advance to schedule this appointment.  You may see Peter Martinique, MD or one of the following Advanced Practice Providers on your designated Care Team: [place], [place] . Fabian Sharp, PA-C  Any Other Special Instructions Will Be Listed Below (If Applicable).

## 2019-06-01 NOTE — Assessment & Plan Note (Signed)
On Synthroid-TSH WNL Jan 2020

## 2019-06-01 NOTE — Assessment & Plan Note (Signed)
CHADS VASC=4, low dose Eliquis secondary to wgt and age

## 2019-06-01 NOTE — Assessment & Plan Note (Signed)
Possibly secondary to Coreg and bradycardia now that Amiodarone has been added- Coreg decreased to 6.25 mg BID

## 2019-06-01 NOTE — Assessment & Plan Note (Signed)
Controlled--EF 45-50% (in AF) with moderate LAE March 2020

## 2019-06-01 NOTE — Assessment & Plan Note (Signed)
Diagnosed in Jan 2020 but may have been present since Sept 2020- EF 45-50%, mod LAE.  S/P elective OP DCCV 04/27/2019 but recurrent AF with RVR 05/12/2019- Amiodarone added.  She converted on Amio to NSR.

## 2019-06-01 NOTE — Telephone Encounter (Signed)

## 2019-06-01 NOTE — Progress Notes (Signed)
Cardiology Office Note:    Date:  06/01/2019   ID:  Jenna [name], DOB December 12, 1932, MRN [id number]  PCP:  [name], MD  Cardiologist:  Peter Martinique, MD  Electrophysiologist:  None   Referring MD: [name], MD   Fatigue  History of Present Illness:    Jenna [name] is a pleasant 85 y.o. female with a hx of PAF which was first noticed in January 2020.  The patient reports she thinks it may have actually been present since September 2019 when she noticed fatigue.  She had been under a great deal of stress, her husband was ill at that time.  Her other medical issues include hypertension, and treated hypothyroidism.  She was placed on Eliquis, her CHADS VASC is 4.  She is on Eliquis 2.5 mg twice daily secondary to her age and BMI.  Echocardiogram January 12, 2019 showed an ejection fraction of 45 to 50% with moderate left atrial enlargement.  Her cardioversion was delayed secondary to the [place] V[place] pandemic.  She ultimately underwent outpatient DC cardioversion 04/29/2019.  She converted to sinus rhythm.    She said she did well until this 05/12/2019 when she felt fatigue and palpitations.  She was seen in the office and noted to be in AF with RVR (114).  She was placed on Amiodarone 200 mg BID x 10 days, then 200 mg daily.  The plan was for OP DCCV if she remained in AF.   She is seen today in follow up.  She feels better and her EKG shows she is in NSR-64.  She has noted fatigue and at times her HR is in the 50's.  At night she lays awake and says she can feel her heart beat, not fast or irregular, just hard beat.  Past Medical History:  Diagnosis Date  . Asthmatic bronchitis   . Atrial fibrillation ([place]) 2019  . Cataracts, bilateral    immature  . Chronic back pain    arthritis.Stenosis  . Dysphagia   . Dysrhythmia    (?or extra beats) treated /w atenolol  . Emphysema lung ([place])    left  . GERD (gastroesophageal reflux disease)    takes Omeprazole daily  . Hard of hearing     wears hearing aides   . History of blood transfusion    no abnormal reaction noted  . History of gastric ulcer   . Hypercholesteremia    takes Atorvastatin daily  . Hypertension    takes Coreg and Losartan daily  . Hypothyroidism    takes Synthroid daily  . IBS (irritable bowel syndrome)    takes Librax daily  . Joint pain   . Lumbosacral spondylosis without myelopathy   . Melanoma ([place])    on left leg, wide excision   . Muscle spasm    takes Robaxin daily as needed  . Neuromuscular disorder ([place])    esophageal spasms at times   . Osteoarthritis   . Peripheral edema    take HCTZ daily  . Pneumonia    hx of.Many yrs ago  . 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 Seasonal allergies    takes Allegra daily and uses Flonase daily as needed  . Shortness of breath dyspnea    with exertion  . Venous stasis   . Yeast infection    given Diflucan today    Past Surgical History:  Procedure Laterality Date  . ABDOMINAL HYSTERECTOMY  1982   BSO  . ANTERIOR CERVICAL DECOMP/DISCECTOMY FUSION N/A 10/03/2014   Procedure: ANTERIOR CERVICAL DECOMPRESSION/DISCECTOMY FUSION 1 LEVEL,CERVICAL THREE-FOUR;  Surgeon: [name], MD;  Location: [place] NEURO ORS;  Service: Neurosurgery;  Laterality: N/A;  . APPENDECTOMY  1986   along with chole  . BREAST BIOPSY Right 1997   sclerosing adenosis -Dr. Lucia Gaskins  . CARDIOVERSION N/A 04/29/2019   Procedure: CARDIOVERSION;  Surgeon: Lelon Perla, MD;  Location: [facility] ENDOSCOPY;  Service: Cardiovascular;  Laterality: N/A;  . CHOLECYSTECTOMY    . COLONOSCOPY    . ESOPHAGOGASTRODUODENOSCOPY     with dilitation  . LIPOMA EXCISION Left 10/1999   left thigh  . lumbosacral cyst  2005   seen on injection for low back pain   . LUNG BIOPSY  05/2001   /w Dr.Burney- bronchoscopy, mediastinoscopy  . REVERSE SHOULDER ARTHROPLASTY Left 02/01/2016   Procedure: LEFT REVERSE TOTAL SHOULDER ARTHROPLASTY;  Surgeon:  [name], MD;  Location: [facility];  Service: Orthopedics;  Laterality: Left;  . rotator cuff  Bilateral 1996/1998   repair  . ROTATOR CUFF REPAIR Left 1996; 1981  . ROTATOR CUFF REPAIR Right 1996  . TONSILLECTOMY    . TOTAL HIP ARTHROPLASTY Left 10/2000  . TOTAL HIP ARTHROPLASTY Right 06/2005  . TOTAL HIP ARTHROPLASTY Left    10/2000  . VAGINAL DELIVERY     x1    Current Medications: Current Meds  Medication Sig  . albuterol (PROVENTIL HFA;VENTOLIN HFA) 108 (90 Base) MCG/ACT inhaler Inhale 2 puffs into the lungs every 6 (six) hours as needed for wheezing or shortness of breath.  [name] amiodarone (PACERONE) 200 MG tablet Take 1 tablet (200 mg total) by mouth daily.  [name] amLODipine (NORVASC) 2.5 MG tablet Take 2.5 mg by mouth daily.   [name] apixaban (ELIQUIS) 2.5 MG TABS tablet Take 1 tablet (2.5 mg total) by mouth 2 (two) times daily.  [name] atorvastatin (LIPITOR) 20 MG tablet Take 20 mg by mouth daily.  . Calcium Carbonate-Vitamin D (CALCIUM 600+D PO) Take 1 tablet by mouth daily.  . carvedilol (COREG) 6.25 MG tablet Take 1 tablet (6.25 mg total) by mouth 2 (two) times daily with a meal.  . cholecalciferol (VITAMIN D) 1000 UNITS tablet Take 1,000 Units by mouth daily.  . clidinium-chlordiazePOXIDE (LIBRAX) 5-2.5 MG capsule chlordiazepoxide-clidinium 5 mg-2.5 mg capsule  TAKE 1 CAPSULE BY MOUTH EVERY DAY AS DIRECTED  . diazepam (VALIUM) 5 MG tablet Take 2.5 mg by mouth at bedtime as needed for sedation.   . Eflornithine HCl (VANIQA) 13.9 % cream Vaniqa 13.9 % topical cream  APPLY TO AFFECTED AREA TWICE A DAY AS DIRECTED  . fluticasone (FLONASE) 50 MCG/ACT nasal spray Place 2 sprays into both nostrils daily.   . furosemide (LASIX) 20 MG tablet Take 20 mg by mouth daily.   . hyoscyamine (LEVSIN SL) 0.125 MG SL tablet Place 0.125 mg under the tongue every 4 (four) hours as needed for cramping.   [name] levothyroxine (SYNTHROID, LEVOTHROID) 112 MCG tablet Take 112 mcg by mouth daily before breakfast.  .  losartan (COZAAR) 100 MG tablet Take 1 tablet (100 mg total) by mouth daily.  . naphazoline-pheniramine (NAPHCON-A) 0.025-0.3 % ophthalmic solution Place 1 drop into both eyes 4 (four) times daily as needed for eye irritation.  [name] omeprazole (PRILOSEC) 40 MG capsule Take 40 mg by mouth at bedtime.   [name] oxyCODONE (OXY IR/ROXICODONE) 5 MG immediate release tablet Take 1 tablet (5 mg total) by mouth every 8 (eight) hours as  needed for moderate pain. (Patient taking differently: Take 2.5-5 mg by mouth every 8 (eight) hours as needed for moderate pain. )  . [DISCONTINUED] carvedilol (COREG) 12.5 MG tablet Take 12.5 mg by mouth 2 (two) times daily with a meal.     Allergies:   Codeine, Pseudoephedrine hcl, Tylenol [acetaminophen], Ultram [tramadol], Vicodin [hydrocodone-acetaminophen], Dalmane [flurazepam hcl], Nortriptyline, Cephalexin, Clarithromycin, Levofloxacin, and Vibramycin [doxycycline calcium]   Social History   Socioeconomic History  . Marital status: Married    Spouse name: Not on file  . Number of children: Not on file  . Years of education: Not on file  . Highest education level: Not on file  Occupational History  . Not on file  Social Needs  . Financial resource strain: Not on file  . Food insecurity    Worry: Not on file    Inability: Not on file  . Transportation needs    Medical: Not on file    Non-medical: Not on file  Tobacco Use  . Smoking status: Former Smoker    Packs/day: 1.00    Years: 15.00    Pack years: 15.00  . Smokeless tobacco: Never Used  . Tobacco comment: quit smoking in 1985  Substance and Sexual Activity  . Alcohol use: No    Frequency: Never  . Drug use: No  . Sexual activity: Not Currently    Partners: Male    Birth control/protection: Surgical    Comment: hysterectomy  Lifestyle  . Physical activity    Days per week: Not on file    Minutes per session: Not on file  . Stress: Not on file  Relationships  . Social Herbalist on  phone: Not on file    Gets together: Not on file    Attends religious service: Not on file    Active member of club or organization: Not on file    Attends meetings of clubs or organizations: Not on file    Relationship status: Not on file  Other Topics Concern  . Not on file  Social History Narrative  . Not on file     Family History: The patient's family history includes Diabetes in her brother; Heart attack in her father; Heart disease in her brother; Stroke in her mother.  ROS:   Please see the history of present illness.     All other systems reviewed and are negative.  EKGs/Labs/Other Studies Reviewed:    The following studies were reviewed today:   EKG:  EKG is ordered today.  The ekg ordered today demonstrates NSR, HR 64, low voltage  Recent Labs: 04/26/2019: BUN 13; Creatinine, Ser 0.78; Hemoglobin 12.9; Platelets 248; Potassium 5.1; Sodium 140  Recent Lipid Panel No results found for: CHOL, TRIG, HDL, CHOLHDL, VLDL, LDLCALC, LDLDIRECT  Physical Exam:    VS:  BP 120/62 (BP Location: Left Arm, Patient Position: Sitting, Cuff Size: Normal)   Pulse 67   Ht 5' 2.5" (1.588 m)   Wt 132 lb (59.9 kg)   LMP 11/03/1980 (Approximate)   SpO2 98%   BMI 23.76 kg/m     Wt Readings from Last 3 Encounters:  06/01/19 132 lb (59.9 kg)  05/24/19 132 lb (59.9 kg)  05/12/19 129 lb 9.6 oz (58.8 kg)     GEN:  Well nourished, well developed in no acute distress HEENT: Normal NECK: No JVD; No carotid bruits LYMPHATICS: No lymphadenopathy CARDIAC: RRR, no murmurs, rubs, gallops RESPIRATORY:  Clear to auscultation without rales, wheezing or  rhonchi  ABDOMEN: Soft, non-tender, non-distended MUSCULOSKELETAL:  No edema; No deformity  SKIN: Warm and dry NEUROLOGIC:  Alert and oriented x 3 PSYCHIATRIC:  Normal affect   ASSESSMENT:    PAF (paroxysmal atrial fibrillation) ([place]) Diagnosed in Jan 2020 but may have been present since Sept 2020- EF 45-50%, mod LAE.  S/P elective OP  DCCV 04/27/2019 but recurrent AF with RVR 05/12/2019- Amiodarone added.  She converted on Amio to NSR.  Chronic anticoagulation CHADS VASC=4, low dose Eliquis secondary to wgt and age  Hypothyroidism On Synthroid-TSH WNL Jan 2020  Essential hypertension Controlled--EF 45-50% (in AF) with moderate LAE March 2020  Fatigue Possibly secondary to Coreg and bradycardia now that Amiodarone has been added- Coreg decreased to 6.25 mg BID  PLAN:    I tried to reassure her her heart function and rhythm were OK.  I did suggest she decrease her coreg to 6.25 mg BID.  F/U [name] 3 months, check CMET and TSH in 2 months.    Medication Adjustments/Labs and Tests Ordered: Current medicines are reviewed at length with the patient today.  Concerns regarding medicines are outlined above.  Orders Placed This Encounter  Procedures  . TSH  . Comprehensive Metabolic Panel (CMET)   Meds ordered this encounter  Medications  . carvedilol (COREG) 6.25 MG tablet    Sig: Take 1 tablet (6.25 mg total) by mouth 2 (two) times daily with a meal.    Dispense:  30 tablet    Refill:  2    NEW DOSE    Patient Instructions  Medication Instructions:  DECREASE Coreg to 6.25mg  Take 1 tablet twice a day  If you need a refill on your cardiac medications before your next appointment, please call your pharmacy.   Lab work: Your physician recommends that you return for lab work in: 2 months CMET, TSH  If you have labs (blood work) drawn today and your tests are completely normal, you will receive your results only by: [name] MyChart Message (if you have MyChart) OR . A paper copy in the mail If you have any lab test that is abnormal or we need to change your treatment, we will call you to review the results.  Testing/Procedures: NONE   Follow-Up: At [facility], you and your health needs are our priority.  As part of our continuing mission to provide you with exceptional heart care, we have created designated  Provider Care Teams.  These Care Teams include your primary Cardiologist (physician) and Advanced Practice Providers (APPs -  Physician Assistants and Nurse Practitioners) who all work together to provide you with the care you need, when you need it. You will need a follow up appointment in 3 months.  Please call our office 2 months in advance to schedule this appointment.  You may see Peter Martinique, MD or one of the following Advanced Practice Providers on your designated Care Team: Nadine, [place] . Fabian Sharp, PA-C  Any Other Special Instructions Will Be Listed Below (If Applicable).      Signed, Kerin Ransom, PA-C  06/01/2019 1:48 PM    [facility]

## 2019-06-02 ENCOUNTER — Other Ambulatory Visit (INDEPENDENT_AMBULATORY_CARE_PROVIDER_SITE_OTHER): Payer: Medicare Other

## 2019-06-02 DIAGNOSIS — Z7901 Long term (current) use of anticoagulants: Secondary | ICD-10-CM

## 2019-06-02 DIAGNOSIS — I10 Essential (primary) hypertension: Secondary | ICD-10-CM

## 2019-06-02 DIAGNOSIS — I480 Paroxysmal atrial fibrillation: Secondary | ICD-10-CM | POA: Diagnosis not present

## 2019-06-02 DIAGNOSIS — E039 Hypothyroidism, unspecified: Secondary | ICD-10-CM

## 2019-06-14 ENCOUNTER — Telehealth: Payer: Self-pay

## 2019-06-14 MED ORDER — OXYCODONE HCL 5 MG PO TABS: 5.0000 mg | ORAL_TABLET | Freq: Three times a day (TID) | ORAL | 0 refills | Status: DC | PRN

## 2019-06-14 NOTE — Telephone Encounter (Signed)
Return Jenna [name] call, she reports she has 32 tablets of her oxycodone. We will e-scribe her Oxycodone. PMP was reviewed.

## 2019-06-14 NOTE — Telephone Encounter (Signed)
Patient called stating she was to call you today about refilling her Oxycodone.

## 2019-06-24 ENCOUNTER — Telehealth: Payer: Self-pay | Admitting: Cardiology

## 2019-06-24 NOTE — Telephone Encounter (Signed)
RETURNED call to pt she states that she is getting very constipated since she started taking the amiodarone and this is listed as a side effect she has tried senokot and she does take the metamucil daily she will stop this and start a stool softener to see if this helps. She will call back if this does not help.

## 2019-06-24 NOTE — Telephone Encounter (Signed)
Unable to reach pt or leave a message  

## 2019-06-24 NOTE — Telephone Encounter (Signed)
New Message   Pt c/o medication issue:  1. Name of Medication: amiodarone (PACERONE) 200 MG tablet  2. How are you currently taking this medication (dosage and times per day)? Take 1 tablet (200 mg total) by mouth daily.  3. Are you having a reaction (difficulty breathing--STAT)? No  4. What is your medication issue? Patient is having severe constipation, tried over the counter medication for it, not working.

## 2019-06-28 ENCOUNTER — Encounter: Payer: Medicare Other | Attending: Physical Medicine and Rehabilitation | Admitting: Registered Nurse

## 2019-06-28 ENCOUNTER — Encounter: Payer: Self-pay | Admitting: Registered Nurse

## 2019-06-28 ENCOUNTER — Other Ambulatory Visit: Payer: Self-pay

## 2019-06-28 VITALS — BP 144/83 | HR 103 | Temp 98.7°F | Resp 16 | Ht 62.5 in | Wt 131.6 lb

## 2019-06-28 DIAGNOSIS — Z981 Arthrodesis status: Secondary | ICD-10-CM

## 2019-06-28 DIAGNOSIS — M546 Pain in thoracic spine: Secondary | ICD-10-CM | POA: Diagnosis not present

## 2019-06-28 DIAGNOSIS — M12812 Other specific arthropathies, not elsewhere classified, left shoulder: Secondary | ICD-10-CM | POA: Insufficient documentation

## 2019-06-28 DIAGNOSIS — G8929 Other chronic pain: Secondary | ICD-10-CM

## 2019-06-28 DIAGNOSIS — Z79891 Long term (current) use of opiate analgesic: Secondary | ICD-10-CM

## 2019-06-28 DIAGNOSIS — M47817 Spondylosis without myelopathy or radiculopathy, lumbosacral region: Secondary | ICD-10-CM

## 2019-06-28 DIAGNOSIS — G894 Chronic pain syndrome: Secondary | ICD-10-CM

## 2019-06-28 DIAGNOSIS — Z79899 Other long term (current) drug therapy: Secondary | ICD-10-CM | POA: Insufficient documentation

## 2019-06-28 DIAGNOSIS — M47816 Spondylosis without myelopathy or radiculopathy, lumbar region: Secondary | ICD-10-CM | POA: Diagnosis not present

## 2019-06-28 DIAGNOSIS — M12811 Other specific arthropathies, not elsewhere classified, right shoulder: Secondary | ICD-10-CM | POA: Insufficient documentation

## 2019-06-28 DIAGNOSIS — M4712 Other spondylosis with myelopathy, cervical region: Secondary | ICD-10-CM | POA: Diagnosis not present

## 2019-06-28 DIAGNOSIS — M542 Cervicalgia: Secondary | ICD-10-CM

## 2019-06-28 DIAGNOSIS — M47812 Spondylosis without myelopathy or radiculopathy, cervical region: Secondary | ICD-10-CM | POA: Diagnosis present

## 2019-06-28 DIAGNOSIS — Z5181 Encounter for therapeutic drug level monitoring: Secondary | ICD-10-CM | POA: Insufficient documentation

## 2019-06-28 NOTE — Progress Notes (Signed)
Subjective:    Patient ID: Jenna [name], female    DOB: [birth year], 85 y.o.   MRN: [id number]  HPI: Jenna [name] is a 85 y.o. female who returns for follow up appointment for chronic pain and medication refill. She states her pain is located in her neck, right shoulder and mid- back. She rates her pain 4. Her  current exercise regime is walking and performing stretching exercises.  Ms. Ariel Morphine equivalent is 22.50 MME. She is also prescribed Diazepam by Dr. Mancel Bale We have discussed the black box warning of using opioids and benzodiazepines. I highlighted the dangers of using these drugs together and discussed the adverse events including respiratory suppression, overdose, cognitive impairment and importance of compliance with current regimen. We will continue to monitor and adjust as indicated.   Last Oral Swab was Performed on 04/21/2019, it was consistent.    Pain Inventory Average Pain 4 Pain Right Now 4 My pain is intermittent, constant and throbbing  In the last 24 hours, has pain interfered with the following? General activity 6 Relation with others 3 Enjoyment of life 4 What TIME of day is your pain at its worst? daytime Sleep (in general) Fair  Pain is worse with: standing and some activites Pain improves with: rest, pacing activities and medication Relief from Meds: 5  Mobility walk without assistance use a cane how many minutes can you walk? 10 ability to climb steps?  yes do you drive?  yes Do you have any goals in this area?  yes  Function retired  Neuro/Psych weakness  Prior Studies Any changes since last visit?  no  Physicians involved in your care Any changes since last visit?  no   Family History  Problem Relation Age of Onset  . Stroke Mother   . Heart attack Father   . Diabetes Brother   . Heart disease Brother    Social History   Socioeconomic History  . Marital status: Married    Spouse name: Not on file  . Number of  children: Not on file  . Years of education: Not on file  . Highest education level: Not on file  Occupational History  . Not on file  Social Needs  . Financial resource strain: Not on file  . Food insecurity    Worry: Not on file    Inability: Not on file  . Transportation needs    Medical: Not on file    Non-medical: Not on file  Tobacco Use  . Smoking status: Former Smoker    Packs/day: 1.00    Years: 15.00    Pack years: 15.00  . Smokeless tobacco: Never Used  . Tobacco comment: quit smoking in 1985  Substance and Sexual Activity  . Alcohol use: No    Frequency: Never  . Drug use: No  . Sexual activity: Not Currently    Partners: Male    Birth control/protection: Surgical    Comment: hysterectomy  Lifestyle  . Physical activity    Days per week: Not on file    Minutes per session: Not on file  . Stress: Not on file  Relationships  . Social Herbalist on phone: Not on file    Gets together: Not on file    Attends religious service: Not on file    Active member of club or organization: Not on file    Attends meetings of clubs or organizations: Not on file    Relationship status: Not on  file  Other Topics Concern  . Not on file  Social History Narrative  . Not on file   Past Surgical History:  Procedure Laterality Date  . ABDOMINAL HYSTERECTOMY  1982   BSO  . ANTERIOR CERVICAL DECOMP/DISCECTOMY FUSION N/A 10/03/2014   Procedure: ANTERIOR CERVICAL DECOMPRESSION/DISCECTOMY FUSION 1 LEVEL,CERVICAL THREE-FOUR;  Surgeon: [name], MD;  Location: [place] NEURO ORS;  Service: Neurosurgery;  Laterality: N/A;  . APPENDECTOMY  1986   along with chole  . BREAST BIOPSY Right 1997   sclerosing adenosis -Dr. Lucia Gaskins  . CARDIOVERSION N/A 04/29/2019   Procedure: CARDIOVERSION;  Surgeon: Lelon Perla, MD;  Location: [facility] ENDOSCOPY;  Service: Cardiovascular;  Laterality: N/A;  . CHOLECYSTECTOMY    . COLONOSCOPY    . ESOPHAGOGASTRODUODENOSCOPY     with dilitation   . LIPOMA EXCISION Left 10/1999   left thigh  . lumbosacral cyst  2005   seen on injection for low back pain   . LUNG BIOPSY  05/2001   /w Dr.Burney- bronchoscopy, mediastinoscopy  . REVERSE SHOULDER ARTHROPLASTY Left 02/01/2016   Procedure: LEFT REVERSE TOTAL SHOULDER ARTHROPLASTY;  Surgeon: [name], MD;  Location: [place];  Service: Orthopedics;  Laterality: Left;  . rotator cuff  Bilateral 1996/1998   repair  . ROTATOR CUFF REPAIR Left 1996; 1981  . ROTATOR CUFF REPAIR Right 1996  . TONSILLECTOMY    . TOTAL HIP ARTHROPLASTY Left 10/2000  . TOTAL HIP ARTHROPLASTY Right 06/2005  . TOTAL HIP ARTHROPLASTY Left    10/2000  . VAGINAL DELIVERY     x1   Past Medical History:  Diagnosis Date  . Asthmatic bronchitis   . Atrial fibrillation ([place]) 2019  . Cataracts, bilateral    immature  . Chronic back pain    arthritis.Stenosis  . Dysphagia   . Dysrhythmia    (?or extra beats) treated /w atenolol  . Emphysema lung ([place])    left  . GERD (gastroesophageal reflux disease)    takes Omeprazole daily  . Hard of hearing    wears hearing aides   . History of blood transfusion    no abnormal reaction noted  . History of gastric ulcer   . Hypercholesteremia    takes Atorvastatin daily  . Hypertension    takes Coreg and Losartan daily  . Hypothyroidism    takes Synthroid daily  . IBS (irritable bowel syndrome)    takes Librax daily  . Joint pain   . Lumbosacral spondylosis without myelopathy   . Melanoma ([place])    on left leg, wide excision   . Muscle spasm    takes Robaxin daily as needed  . Neuromuscular disorder ([place])    esophageal spasms at times   . Osteoarthritis   . Peripheral edema    take HCTZ daily  . Pneumonia    hx of.Many yrs ago  . 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 Seasonal allergies    takes Allegra daily and uses Flonase daily as needed  . Shortness of breath dyspnea    with exertion  . Venous stasis    . Yeast infection    given Diflucan today   LMP 11/03/1980 (Approximate)   Opioid Risk Score:   Fall Risk Score:  `1  Depression screen PHQ 2/9  Depression screen [facility] 2/9 12/14/2018 11/05/2018 09/13/2018 05/13/2018 04/08/2018 03/10/2018 11/23/2017  Decreased Interest 0 0 0 0 - 0 0  Down, Depressed, Hopeless 0 0 0 0 -  0 0  PHQ - 2 Score 0 0 0 0 - 0 0  Altered sleeping - - - - 0 - -  Tired, decreased energy - - - - - - -  Change in appetite - - - - - - -  Feeling bad or failure about yourself  - - - - - - -  Trouble concentrating - - - - - - -  Moving slowly or fidgety/restless - - - - - - -  Suicidal thoughts - - - - - - -  PHQ-9 Score - - - - - - -  Some recent data might be hidden     Review of Systems  Constitutional: Negative.   HENT: Negative.   Eyes: Negative.   Respiratory: Negative.   Cardiovascular: Negative.   Gastrointestinal: Negative.   Endocrine: Negative.   Genitourinary: Negative.   Musculoskeletal: Positive for back pain, gait problem and myalgias.  Skin: Negative.   Allergic/Immunologic: Negative.   Neurological: Positive for weakness.  Psychiatric/Behavioral: Negative.   All other systems reviewed and are negative.      Objective:   Physical Exam Vitals signs and nursing note reviewed.  Constitutional:      Appearance: Normal appearance.  Neck:     Musculoskeletal: Normal range of motion and neck supple.  Cardiovascular:     Rate and Rhythm: Normal rate and regular rhythm.     Pulses: Normal pulses.     Heart sounds: Normal heart sounds.  Pulmonary:     Effort: Pulmonary effort is normal.     Breath sounds: Normal breath sounds.  Musculoskeletal:     Comments: Normal Muscle Bulk and Muscle Testing Reveals:  Upper Extremities: Full ROM and Muscle Strength 5/5 Right AC Joint Tenderness Thoracic Paraspinal Tenderness: T-7-T-9  Lower Extremities: Full ROM and Muscle Strength 5/5 Arises from Table with ease using cane for support Narrow Based  Gait   Skin:    General: Skin is warm and dry.  Neurological:     Mental Status: She is alert and oriented to person, place, and time.  Psychiatric:        Mood and Affect: Mood normal.        Behavior: Behavior normal.           Assessment & Plan:  1.Chronic low back pain withlumbar spondylosis: Continue withSlow Weaning:Continue Oxycodone. 5mg  one tablet every 8 hours as needed # 65. No script Given. 06/28/2019 We will continue the opioid monitoring program,this consists of regular clinic visits, examinations, urine drug screen, pill counts as well as use of [place] Controlled Substance reporting System. 2. Lumbar Radiculitis: S/PLumbar Radiofrequency with relief noted.Adverse Reaction with Gabapentin and Amitriptyline we willContinue to monitor.06/28/2019 3. Cervicalgia/Cervical Spondylosis/Cervical stenosis with radiculitis/ S/P Anterior Cervical Fusion C3-C4 with Dr. Ellene Route on 10/03/2014.3-level anterior decompression and arthrodesis per [facility] on hold.Continue to Monitor.Dr. Ellene Route Following. 06/28/2019 4. Chronic Right shoulder pain:Continue with Current medication regime.06/28/2019 S/P Left Reverse Total Shoulder Arthroplasty on 02/01/16 via Dr. Veverly Fells. 5. Chronic Right- sided Thoracic Back pain: Continue HEP as Tolerated. Continue to monitor.06/28/2019. 6. Muscle Spasm:No complaints today.Continueto monitor.06/28/2019.  F/U in 1 month  15 minutes of face to face patient care time was spent during this visit. All questions were encouraged and answered.

## 2019-07-04 ENCOUNTER — Telehealth: Payer: Self-pay | Admitting: Cardiology

## 2019-07-04 NOTE — Telephone Encounter (Signed)
Returned the call to the patient. She currently takes Amiodarone 200 mg daily and wonders if her symptoms is coming from the medication. She stated that she has been on Amiodarone for one month now.  Symptoms: Red splotches on her lower legs-started 2-3 days ago (this concerns her the most) Not painful and they don't itch but they are spreading. Weakness/Tired Hot flashes Constipation

## 2019-07-04 NOTE — Telephone Encounter (Signed)
Pt c/o medication issue:  1. Name of Medication:  Amiodarone  2. How are you currently taking this medication (dosage and times per day)? 1 times a day now, she was taking 2 times a day  3. Are you having a reaction (difficulty breathing--STAT)? no  4. What is your medication issue?  Severe constipation,  Hoot flashes, feeling hot and cold, weakness and  red blotches on her leg

## 2019-07-05 NOTE — Telephone Encounter (Signed)
Spoke to patient Dr.Jordan's recommendation given.She will decrease Amiodarone to 100 mg daily.She requested sooner appointment.Appointment scheduled with Kerin Ransom PA 07/25/19 at 2:30 pm.Advised to call sooner if needed.

## 2019-07-05 NOTE — Telephone Encounter (Signed)
I am curious to know what her HR is. When last seen in office pulse was 50 and Coreg was reduced. A slow HR could contribute to fatigue. For now I would reduce amiodarone to 100 mg daily. Keep appointment as scheduled.  Peter Martinique MD, [facility]

## 2019-07-25 ENCOUNTER — Encounter: Payer: Self-pay | Admitting: Cardiology

## 2019-07-25 ENCOUNTER — Telehealth (INDEPENDENT_AMBULATORY_CARE_PROVIDER_SITE_OTHER): Payer: Medicare Other | Admitting: Cardiology

## 2019-07-25 ENCOUNTER — Telehealth: Payer: Self-pay

## 2019-07-25 VITALS — BP 143/64 | HR 77 | Ht 62.5 in | Wt 133.0 lb

## 2019-07-25 DIAGNOSIS — G8929 Other chronic pain: Secondary | ICD-10-CM

## 2019-07-25 DIAGNOSIS — I480 Paroxysmal atrial fibrillation: Secondary | ICD-10-CM

## 2019-07-25 DIAGNOSIS — R5383 Other fatigue: Secondary | ICD-10-CM

## 2019-07-25 DIAGNOSIS — Z7901 Long term (current) use of anticoagulants: Secondary | ICD-10-CM

## 2019-07-25 NOTE — Assessment & Plan Note (Signed)
Her primary complaint today- she feels it may be from Amiodarone, dose was decreased to 100 mg daily on 07/05/2019

## 2019-07-25 NOTE — Assessment & Plan Note (Signed)
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

## 2019-07-25 NOTE — Assessment & Plan Note (Signed)
Diagnosed in Jan 2020 but may have been present since Sept 2020- EF 45-50%, mod LAE.  S/P elective OP DCCV 04/27/2019. Recurrent PAF 05/12/2019- Amio added with NSR documented 06/01/2019

## 2019-07-25 NOTE — Telephone Encounter (Signed)
Virtual Visit Pre-Appointment Phone Call  "Jenna [name], I am calling you today to discuss your upcoming appointment. We are currently trying to limit exposure to the virus that causes COVID-19 by seeing patients at home rather than in the office."  1. "What is the BEST phone number to call the day of the visit?" - include this in appointment notes  2. "Do you have or have access to (through a family member/friend) a smartphone with video capability that we can use for your visit?" a. If yes - list this number in appt notes as "cell" (if different from BEST phone #) and list the appointment type as a VIDEO visit in appointment notes b. If no - list the appointment type as a PHONE visit in appointment notes  3. Confirm consent - "In the setting of the current Covid19 crisis, you are scheduled for a (phone or video) visit with your provider on (date) at (time).  Just as we do with many in-office visits, in order for you to participate in this visit, we must obtain consent.  If you'd like, I can send this to your mychart (if signed up) or email for you to review.  Otherwise, I can obtain your verbal consent now.  All virtual visits are billed to your insurance company just like a normal visit would be.  By agreeing to a virtual visit, we'd like you to understand that the technology does not allow for your provider to perform an examination, and thus may limit your provider's ability to fully assess your condition. If your provider identifies any concerns that need to be evaluated in person, we will make arrangements to do so.  Finally, though the technology is pretty good, we cannot assure that it will always work on either your or our end, and in the setting of a video visit, we may have to convert it to a phone-only visit.  In either situation, we cannot ensure that we have a secure connection.  Are you willing to proceed?" STAFF: Did the patient verbally acknowledge consent to telehealth visit?  Document YES/NO here: YES  4. Advise patient to be prepared - "Two hours prior to your appointment, go ahead and check your blood pressure, pulse, oxygen saturation, and your weight (if you have the equipment to check those) and write them all down. When your visit starts, your provider will ask you for this information. If you have an Apple Watch or Kardia device, please plan to have heart rate information ready on the day of your appointment. Please have a pen and paper handy nearby the day of the visit as well."  5. Give patient instructions for MyChart download to smartphone OR Doximity/Doxy.me as below if video visit (depending on what platform provider is using)  6. Inform patient they will receive a phone call 15 minutes prior to their appointment time (may be from unknown caller ID) so they should be prepared to answer    [place] has been deemed a candidate for a follow-up tele-health visit to limit community exposure during the Covid-19 pandemic. I spoke with the patient via phone to ensure availability of phone/video source, confirm preferred email & phone number, and discuss instructions and expectations.  I reminded Jenna [name] to be prepared with any vital sign and/or heart rhythm information that could potentially be obtained via home monitoring, at the time of her visit. I reminded Jenna [name] to expect a phone call  prior to her visit.  Harold Hedge, CMA 07/25/2019 1:56 PM   INSTRUCTIONS FOR DOWNLOADING THE MYCHART APP TO SMARTPHONE  - The patient must first make sure to have activated MyChart and know their login information - If Apple, go to [employer] and type in MyChart in the search bar and download the app. If Android, ask patient to go to [employer] and type in [place] in the search bar and download the app. The app is free but as with any other app downloads, their phone may require them to verify saved payment information  or Apple/Android password.  - The patient will need to then log into the app with their MyChart username and password, and select [facility] as their healthcare provider to link the account. When it is time for your visit, go to the MyChart app, find appointments, and click Begin Video Visit. Be sure to Select Allow for your device to access the Microphone and Camera for your visit. You will then be connected, and your provider will be with you shortly.  **If they have any issues connecting, or need assistance please contact MyChart service desk [phone])**  **If using a computer, in order to ensure the best quality for their visit they will need to use either of the following Internet Browsers: [employer], or Google Chrome**  IF USING DOXIMITY or DOXY.ME - The patient will receive a link just prior to their visit by text.     FULL LENGTH CONSENT FOR TELE-HEALTH VISIT   I hereby voluntarily request, consent and authorize [facility] and its employed or contracted physicians, physician assistants, nurse practitioners or other licensed health care professionals (the Practitioner), to provide me with telemedicine health care services (the "Services") as deemed necessary by the treating Practitioner. I acknowledge and consent to receive the Services by the Practitioner via telemedicine. I understand that the telemedicine visit will involve communicating with the Practitioner through live audiovisual communication technology and the disclosure of certain medical information by electronic transmission. I acknowledge that I have been given the opportunity to request an in-person assessment or other available alternative prior to the telemedicine visit and am voluntarily participating in the telemedicine visit.  I understand that I have the right to withhold or withdraw my consent to the use of telemedicine in the course of my care at any time, without affecting my right to future  care or treatment, and that the Practitioner or I may terminate the telemedicine visit at any time. I understand that I have the right to inspect all information obtained and/or recorded in the course of the telemedicine visit and may receive copies of available information for a reasonable fee.  I understand that some of the potential risks of receiving the Services via telemedicine include:  [name] Delay or interruption in medical evaluation due to technological equipment failure or disruption; . Information transmitted may not be sufficient (e.g. poor resolution of images) to allow for appropriate medical decision making by the Practitioner; and/or  . In rare instances, security protocols could fail, causing a breach of personal health information.  Furthermore, I acknowledge that it is my responsibility to provide information about my medical history, conditions and care that is complete and accurate to the best of my ability. I acknowledge that Practitioner's advice, recommendations, and/or decision may be based on factors not within their control, such as incomplete or inaccurate data provided by me or distortions of diagnostic images or specimens that may result from  electronic transmissions. I understand that the practice of medicine is not an exact science and that Practitioner makes no warranties or guarantees regarding treatment outcomes. I acknowledge that I will receive a copy of this consent concurrently upon execution via email to the email address I last provided but may also request a printed copy by calling the office of [facility].    I understand that my insurance will be billed for this visit.   I have read or had this consent read to me. . I understand the contents of this consent, which adequately explains the benefits and risks of the Services being provided via telemedicine.  . I have been provided ample opportunity to ask questions regarding this consent and the Services and have  had my questions answered to my satisfaction. . I give my informed consent for the services to be provided through the use of telemedicine in my medical care  By participating in this telemedicine visit I agree to the above.

## 2019-07-25 NOTE — Patient Instructions (Signed)
Medication Instructions:  Your physician recommends that you continue on your current medications as directed. Please refer to the Current Medication list given to you today. If you need a refill on your cardiac medications before your next appointment, please call your pharmacy.   Lab work: Your physician recommends that you return for lab work in: adding CBC to lab work  If you have labs (blood work) drawn today and your tests are completely normal, you will receive your results only by: [name] MyChart Message (if you have MyChart) OR . A paper copy in the mail If you have any lab test that is abnormal or we need to change your treatment, we will call you to review the results.  Testing/Procedures: None   Follow-Up: At [facility], you and your health needs are our priority.  As part of our continuing mission to provide you with exceptional heart care, we have created designated Provider Care Teams.  These Care Teams include your primary Cardiologist (physician) and Advanced Practice Providers (APPs -  Physician Assistants and Nurse Practitioners) who all work together to provide you with the care you need, when you need it.  . Your physician recommends that you schedule a follow-up appointment in: 3-4 weeks with Kerin Ransom, PA-C  Any Other Special Instructions Will Be Listed Below (If Applicable).

## 2019-07-25 NOTE — Telephone Encounter (Signed)
Contacted patient to discuss AVS Instructions. Gave patient Luke's recommendations from today's virtual office visit. Patient voiced understanding and AVS mailed.    

## 2019-07-25 NOTE — Assessment & Plan Note (Signed)
CHADS VASC=4, low dose Eliquis secondary to wgt and age 

## 2019-07-25 NOTE — Progress Notes (Signed)
Virtual Visit via Telephone Note   This visit type was conducted due to national recommendations for restrictions regarding the COVID-19 Pandemic (e.g. social distancing) in an effort to limit this patient's exposure and mitigate transmission in our community.  Due to her co-morbid illnesses, this patient is at least at moderate risk for complications without adequate follow up.  This format is felt to be most appropriate for this patient at this time.  The patient did not have access to video technology/had technical difficulties with video requiring transitioning to audio format only (telephone).  All issues noted in this document were discussed and addressed.  No physical exam could be performed with this format.  Please refer to the patient's chart for her  consent to telehealth for [facility].   Date:  07/25/2019   ID:  Jenna [name], DOB 24-Oct-1933, MRN [id number]  Patient Location: Home Provider Location: Office  PCP:  [name], MD  Cardiologist:  Peter Martinique, MD  Electrophysiologist:  None   Evaluation Performed:  Follow-Up Visit  Chief Complaint:  fatigue  History of Present Illness:    Jenna [name] is a 85 y.o. female with a hx of PAF which was first noticed in January 2020. The patient reports she thinks it may have actually been present since September 2019 when she noticed fatigue. She had been under a great deal of stress, her husband was diagnosed with cancer at that time. Her other medical issues include hypertension,treated hypothyroidism, and chronic pain from DJD in her neck and shoulders. She was placed on Eliquis, her CHADS VASC is 4. She is on Eliquis 2.5 mg twice daily secondary to her age and BMI. Echocardiogram January 12, 2019 showed an ejection fraction of 45 to 50% with moderate left atrial enlargement. Her cardioversion was delayed secondary to the [place] V[place] pandemic. She ultimately underwent outpatient DC cardioversion 04/29/2019. She  converted to sinus rhythm.  She had recurrent PAF 7/09/2020omplained .  Amiodarone was added and she subsequently converted as as OP on Amiodarone.  When seen 06/01/2019 she was in NSR.  She complained of some fatigue and her beta blocker was cut back.  Since then she has complained of "side effects" from the Amiodarone and the dose was cut back to 100 mg daily on 07/05/2019.  She was contacted today for follow up.  She continues to complain of generalized fatigue.  She asked about alternatives to Amiodarone and noted her history of sensitivity to medications in the past.  She does not appear to be in AF by her history.  She is due for labs next week and I will add a CBC to her TSH and CMET.  She also told me her physical therapy providers are working on weaning her opiates down.  She tells me she may need shoulder surgery and possibly neck surgery, these issues were put off when her husband was ill but he is currently doing well.   The patient does not have symptoms concerning for COVID-19 infection (fever, chills, cough, or new shortness of breath).    Past Medical History:  Diagnosis Date  . Asthmatic bronchitis   . Atrial fibrillation ([place]) 2019  . Cataracts, bilateral    immature  . Chronic back pain    arthritis.Stenosis  . Dysphagia   . Dysrhythmia    (?or extra beats) treated /w atenolol  . Emphysema lung ([place])    left  . GERD (gastroesophageal reflux disease)    takes Omeprazole daily  .  Hard of hearing    wears hearing aides   . History of blood transfusion    no abnormal reaction noted  . History of gastric ulcer   . Hypercholesteremia    takes Atorvastatin daily  . Hypertension    takes Coreg and Losartan daily  . Hypothyroidism    takes Synthroid daily  . IBS (irritable bowel syndrome)    takes Librax daily  . Joint pain   . Lumbosacral spondylosis without myelopathy   . Melanoma ([place])    on left leg, wide excision   . Muscle spasm    takes Robaxin daily as needed   . Neuromuscular disorder ([place])    esophageal spasms at times   . Osteoarthritis   . Peripheral edema    take HCTZ daily  . Pneumonia    hx of.Many yrs ago  . 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 Seasonal allergies    takes Allegra daily and uses Flonase daily as needed  . Shortness of breath dyspnea    with exertion  . Venous stasis   . Yeast infection    given Diflucan today   Past Surgical History:  Procedure Laterality Date  . ABDOMINAL HYSTERECTOMY  1982   BSO  . ANTERIOR CERVICAL DECOMP/DISCECTOMY FUSION N/A 10/03/2014   Procedure: ANTERIOR CERVICAL DECOMPRESSION/DISCECTOMY FUSION 1 LEVEL,CERVICAL THREE-FOUR;  Surgeon: [name], MD;  Location: [facility];  Service: Neurosurgery;  Laterality: N/A;  . APPENDECTOMY  1986   along with chole  . BREAST BIOPSY Right 1997   sclerosing adenosis -Dr. Lucia Gaskins  . CARDIOVERSION N/A 04/29/2019   Procedure: CARDIOVERSION;  Surgeon: Lelon Perla, MD;  Location: [facility] END[facility]COPY;  Service: Cardiovascular;  Laterality: N/A;  . CHOLECYSTECTOMY    . COLONOSCOPY    . ESOPHAGOGASTRODUODENOSCOPY     with dilitation  . LIPOMA EXCISION Left 10/1999   left thigh  . lumbosacral cyst  2005   seen on injection for low back pain   . LUNG BIOPSY  05/2001   /w Dr.Burney- bronchoscopy, mediastinoscopy  . REVERSE SHOULDER ARTHROPLASTY Left 02/01/2016   Procedure: LEFT REVERSE TOTAL SHOULDER ARTHROPLASTY;  Surgeon: [name], MD;  Location: [facility];  Service: Orthopedics;  Laterality: Left;  . rotator cuff  Bilateral 1996/1998   repair  . ROTATOR CUFF REPAIR Left 1996; 1981  . ROTATOR CUFF REPAIR Right 1996  . TONSILLECTOMY    . TOTAL HIP ARTHROPLASTY Left 10/2000  . TOTAL HIP ARTHROPLASTY Right 06/2005  . TOTAL HIP ARTHROPLASTY Left    10/2000  . VAGINAL DELIVERY     x1     Current Meds  Medication Sig  . albuterol (PROVENTIL HFA;VENTOLIN HFA) 108 (90 Base) MCG/ACT inhaler Inhale 2 puffs  into the lungs every 6 (six) hours as needed for wheezing or shortness of breath.  [name] amiodarone (PACERONE) 200 MG tablet Take 1/2 tablet 100 mg daily  . amLODipine (NORVASC) 2.5 MG tablet Take 2.5 mg by mouth daily.   [name] apixaban (ELIQUIS) 2.5 MG TABS tablet Take 1 tablet (2.5 mg total) by mouth 2 (two) times daily.  [name] atorvastatin (LIPITOR) 20 MG tablet Take 20 mg by mouth daily.  . Calcium Carbonate-Vitamin D (CALCIUM 600+D PO) Take 1 tablet by mouth daily.  . carvedilol (COREG) 6.25 MG tablet Take 1 tablet (6.25 mg total) by mouth 2 (two) times daily with a meal.  . cholecalciferol (VITAMIN D) 1000 UNITS tablet Take 1,000 Units by  mouth daily.  . clidinium-chlordiazePOXIDE (LIBRAX) 5-2.5 MG capsule chlordiazepoxide-clidinium 5 mg-2.5 mg capsule  TAKE 1 CAPSULE BY MOUTH EVERY DAY AS DIRECTED  . diazepam (VALIUM) 5 MG tablet Take 2.5 mg by mouth at bedtime as needed for sedation.   . Eflornithine HCl (VANIQA) 13.9 % cream Vaniqa 13.9 % topical cream  APPLY TO AFFECTED AREA TWICE A DAY AS DIRECTED  . fluticasone (FLONASE) 50 MCG/ACT nasal spray Place 2 sprays into both nostrils daily.   . furosemide (LASIX) 20 MG tablet Take 20 mg by mouth daily.   . hyoscyamine (LEVSIN SL) 0.125 MG SL tablet Place 0.125 mg under the tongue every 4 (four) hours as needed for cramping.   [name] levothyroxine (SYNTHROID, LEVOTHROID) 112 MCG tablet Take 112 mcg by mouth daily before breakfast.  . losartan (COZAAR) 100 MG tablet Take 1 tablet (100 mg total) by mouth daily.  . naphazoline-pheniramine (NAPHCON-A) 0.025-0.3 % ophthalmic solution Place 1 drop into both eyes 4 (four) times daily as needed for eye irritation.  [name] omeprazole (PRILOSEC) 40 MG capsule Take 40 mg by mouth at bedtime.   [name] oxyCODONE (OXY IR/ROXICODONE) 5 MG immediate release tablet Take 1 tablet (5 mg total) by mouth every 8 (eight) hours as needed for moderate pain.  [name] triamcinolone cream (KENALOG) 0.1 % USE AS DIRECTED     Allergies:   Codeine,  Pseudoephedrine hcl, Tylenol [acetaminophen], Ultram [tramadol], Vicodin [hydrocodone-acetaminophen], [place]e [flurazepam hcl], Nortriptyline, Cephalexin, Clarithromycin, Levofloxacin, and Vibramycin [doxycycline calcium]   Social History   Tobacco Use  . Smoking status: Former Smoker    Packs/day: 1.00    Years: 15.00    Pack years: 15.00  . Smokeless tobacco: Never Used  . Tobacco comment: quit smoking in 1985  Substance Use Topics  . Alcohol use: No    Frequency: Never  . Drug use: No     Family Hx: The patient's family history includes Diabetes in her brother; Heart attack in her father; Heart disease in her brother; Stroke in her mother.  ROS:   Please see the history of present illness.    All other systems reviewed and are negative.   Prior CV studies:   The following studies were reviewed today: Echo March 2020  Labs/Other Tests and Data Reviewed:    EKG:  No ECG reviewed.  Recent Labs: 04/26/2019: BUN 13; Creatinine, Ser 0.78; Hemoglobin 12.9; Platelets 248; Potassium 5.1; Sodium 140   Recent Lipid Panel No results found for: CHOL, TRIG, HDL, CHOLHDL, LDLCALC, LDLDIRECT  Wt Readings from Last 3 Encounters:  07/25/19 133 lb (60.3 kg)  06/28/19 131 lb 9.6 oz (59.7 kg)  06/01/19 132 lb (59.9 kg)     Objective:    Vital Signs:  BP (!) 143/64   Pulse 77   Ht 5' 2.5" (1.588 m)   Wt 133 lb (60.3 kg)   LMP 11/03/1980 (Approximate)   BMI 23.94 kg/m    VITAL SIGNS:  reviewed  ASSESSMENT & PLAN:    Fatigue  Coreg decreased to 6.25 mg BID 06/01/2019.  Pt believes symptoms may be from Amiodarone- dose decreased to 100 mg daily on 07/05/2019.   PAF (paroxysmal atrial fibrillation) ([place]) Diagnosed in Jan 2020 but may have been present since Sept 2020- EF 45-50%, mod LAE.  S/P elective OP DCCV 04/27/2019 but recurrent AF with RVR 05/12/2019- Amiodarone added.  She converted on Amio to NSR.  Chronic anticoagulation CHADS VASC=4, low dose Eliquis secondary to wgt  and age  Hypothyroidism On Synthroid-TSH  WNL Jan 2020  Essential hypertension Controlled--EF 45-50% (in AF) with moderate LAE March 2020   COVID-19 Education: The signs and symptoms of COVID-19 were discussed with the patient and how to seek care for testing (follow up with PCP or arrange E-visit).  The importance of social distancing was discussed today.  Time:   Today, I have spent 20 minutes with the patient with telehealth technology discussing the above problems.     Medication Adjustments/Labs and Tests Ordered: Current medicines are reviewed at length with the patient today.  Concerns regarding medicines are outlined above.   Tests Ordered: Orders Placed This Encounter  Procedures  . CBC with Differential    Medication Changes: No orders of the defined types were placed in this encounter.   Follow Up:  In Person See me in the office with an EKG in 3-4 weeks.  Signed, Kerin Ransom, PA-C  07/25/2019 2:58 PM    [facility]

## 2019-08-02 ENCOUNTER — Encounter: Payer: Self-pay | Admitting: Registered Nurse

## 2019-08-02 ENCOUNTER — Encounter: Payer: Medicare Other | Attending: Physical Medicine and Rehabilitation | Admitting: Registered Nurse

## 2019-08-02 ENCOUNTER — Other Ambulatory Visit: Payer: Self-pay

## 2019-08-02 VITALS — BP 153/81 | HR 76 | Temp 97.7°F | Ht 62.5 in | Wt 133.6 lb

## 2019-08-02 DIAGNOSIS — Z981 Arthrodesis status: Secondary | ICD-10-CM | POA: Diagnosis not present

## 2019-08-02 DIAGNOSIS — M47816 Spondylosis without myelopathy or radiculopathy, lumbar region: Secondary | ICD-10-CM | POA: Diagnosis present

## 2019-08-02 DIAGNOSIS — M12812 Other specific arthropathies, not elsewhere classified, left shoulder: Secondary | ICD-10-CM | POA: Diagnosis present

## 2019-08-02 DIAGNOSIS — M542 Cervicalgia: Secondary | ICD-10-CM

## 2019-08-02 DIAGNOSIS — M546 Pain in thoracic spine: Secondary | ICD-10-CM

## 2019-08-02 DIAGNOSIS — G894 Chronic pain syndrome: Secondary | ICD-10-CM

## 2019-08-02 DIAGNOSIS — Z79891 Long term (current) use of opiate analgesic: Secondary | ICD-10-CM

## 2019-08-02 DIAGNOSIS — R5383 Other fatigue: Secondary | ICD-10-CM

## 2019-08-02 DIAGNOSIS — M47812 Spondylosis without myelopathy or radiculopathy, cervical region: Secondary | ICD-10-CM | POA: Diagnosis present

## 2019-08-02 DIAGNOSIS — G8929 Other chronic pain: Secondary | ICD-10-CM

## 2019-08-02 DIAGNOSIS — Z79899 Other long term (current) drug therapy: Secondary | ICD-10-CM | POA: Diagnosis present

## 2019-08-02 DIAGNOSIS — M47817 Spondylosis without myelopathy or radiculopathy, lumbosacral region: Secondary | ICD-10-CM

## 2019-08-02 DIAGNOSIS — M12811 Other specific arthropathies, not elsewhere classified, right shoulder: Secondary | ICD-10-CM | POA: Diagnosis present

## 2019-08-02 DIAGNOSIS — M4712 Other spondylosis with myelopathy, cervical region: Secondary | ICD-10-CM | POA: Diagnosis not present

## 2019-08-02 DIAGNOSIS — Z5181 Encounter for therapeutic drug level monitoring: Secondary | ICD-10-CM | POA: Insufficient documentation

## 2019-08-02 MED ORDER — OXYCODONE HCL 5 MG PO TABS: 5.0000 mg | ORAL_TABLET | Freq: Two times a day (BID) | ORAL | 0 refills | Status: DC | PRN

## 2019-08-02 NOTE — Progress Notes (Signed)
Subjective:    Patient ID: Jenna [name], female    DOB: 09/10/1933, 85 y.o.   MRN: [id number]  HPI: Jenna [name] is a 85 y.o. female who returns for follow up appointment for chronic pain and medication refill. She states her pain is located in her neck, right shoulder and mid-back mainly right side. She rates her pain 4. Her current exercise regime is walking.  Jenna [name] reports being fatigued and cardiology is following she states.   Jenna [name] Morphine equivalent is 22.50  MME. She is also prescribed  Diazepam by Dr. Mancel Bale. We have discussed the black box warning of using opioids and benzodiazepines. I highlighted the dangers of using these drugs together and discussed the adverse events including respiratory suppression, overdose, cognitive impairment and importance of compliance with current regimen. We will continue to monitor and adjust as indicated.   Pain Inventory Average Pain 4 Pain Right Now 4 My pain is sharp and aching  In the last 24 hours, has pain interfered with the following? General activity 5 Relation with others 3 Enjoyment of life 5 What TIME of day is your pain at its worst? daytime and evening Sleep (in general) Fair  Pain is worse with: standing and some activites Pain improves with: rest, heat/ice, pacing activities and medication Relief from Meds: 6  Mobility walk without assistance use a walker how many minutes can you walk? 10 ability to climb steps?  yes do you drive?  yes  Function retired  Neuro/Psych weakness trouble walking  Prior Studies Any changes since last visit?  no  Physicians involved in your care Any changes since last visit?  no   Family History  Problem Relation Age of Onset   Stroke Mother    Heart attack Father    Diabetes Brother    Heart disease Brother    Social History   Socioeconomic History   Marital status: Married    Spouse name: Not on file   Number of children: Not on file   Years of  education: Not on file   Highest education level: Not on file  Occupational History   Not on file  Social Needs   Financial resource strain: Not on file   Food insecurity    Worry: Not on file    Inability: Not on file   Transportation needs    Medical: Not on file    Non-medical: Not on file  Tobacco Use   Smoking status: Former Smoker    Packs/day: 1.00    Years: 15.00    Pack years: 15.00   Smokeless tobacco: Never Used   Tobacco comment: quit smoking in 1985  Substance and Sexual Activity   Alcohol use: No    Frequency: Never   Drug use: No   Sexual activity: Not Currently    Partners: Male    Birth control/protection: Surgical    Comment: hysterectomy  Lifestyle   Physical activity    Days per week: Not on file    Minutes per session: Not on file   Stress: Not on file  Relationships   Social connections    Talks on phone: Not on file    Gets together: Not on file    Attends religious service: Not on file    Active member of club or organization: Not on file    Attends meetings of clubs or organizations: Not on file    Relationship status: Not on file  Other Topics Concern  Not on file  Social History Narrative   Not on file   Past Surgical History:  Procedure Laterality Date   ABDOMINAL HYSTERECTOMY  1982   BSO   ANTERIOR CERVICAL DECOMP/DISCECTOMY FUSION N/A 10/03/2014   Procedure: ANTERIOR CERVICAL DECOMPRESSION/DISCECTOMY FUSION 1 LEVEL,CERVICAL THREE-FOUR;  Surgeon: [name], MD;  Location: [facility];  Service: Neurosurgery;  Laterality: N/A;   APPENDECTOMY  1986   along with chole   BREAST BIOPSY Right 1997   sclerosing adenosis -Dr. Lucia Gaskins   CARDIOVERSION N/A 04/29/2019   Procedure: CARDIOVERSION;  Surgeon: Lelon Perla, MD;  Location: [facility] END[facility]COPY;  Service: Cardiovascular;  Laterality: N/A;   CHOLECYSTECTOMY     COLONOSCOPY     ESOPHAGOGASTRODUODENOSCOPY     with dilitation   LIPOMA EXCISION Left 10/1999    left thigh   lumbosacral cyst  2005   seen on injection for low back pain    LUNG BIOPSY  05/2001   /w Dr.Burney- bronchoscopy, mediastinoscopy   REVERSE SHOULDER ARTHROPLASTY Left 02/01/2016   Procedure: LEFT REVERSE TOTAL SHOULDER ARTHROPLASTY;  Surgeon: [name], MD;  Location: [place];  Service: Orthopedics;  Laterality: Left;   rotator cuff  Bilateral 1996/1998   repair   ROTATOR CUFF REPAIR Left 1996; [place] Right 1996   TONSILLECTOMY     TOTAL HIP ARTHROPLASTY Left 10/2000   TOTAL HIP ARTHROPLASTY Right 06/2005   TOTAL HIP ARTHROPLASTY Left    10/2000   VAGINAL DELIVERY     x1   Past Medical History:  Diagnosis Date   Asthmatic bronchitis    Atrial fibrillation ([place]) 2019   Cataracts, bilateral    immature   Chronic back pain    arthritis.Stenosis   Dysphagia    Dysrhythmia    (?or extra beats) treated /w atenolol   Emphysema lung (HCC)    left   GERD (gastroesophageal reflux disease)    takes Omeprazole daily   Hard of hearing    wears hearing aides    History of blood transfusion    no abnormal reaction noted   History of gastric ulcer    Hypercholesteremia    takes Atorvastatin daily   Hypertension    takes Coreg and Losartan daily   Hypothyroidism    takes Synthroid daily   IBS (irritable bowel syndrome)    takes Librax daily   Joint pain    Lumbosacral spondylosis without myelopathy    Melanoma ([place])    on left leg, wide excision    Muscle spasm    takes Robaxin daily as needed   Neuromuscular disorder (HCC)    esophageal spasms at times    Osteoarthritis    Peripheral edema    take HCTZ daily   Pneumonia    hx of.Many yrs ago   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   developed into pneumonia, resolved within a year   Seasonal allergies    takes Allegra daily and uses Flonase daily as needed   Shortness of breath dyspnea    with exertion   Venous stasis    Yeast infection    given  Diflucan today   BP (!) 153/81    Pulse 76    Temp 97.9 F (36.6 C)    Ht 5' 2.5" (1.588 m)    Wt 133 lb 9.6 oz (60.6 kg)    LMP 11/03/1980 (Approximate)    SpO2 95%    BMI 24.05 kg/m   Opioid Risk Score:  Fall Risk Score:  `1  Depression screen PHQ 2/9  Depression screen [facility] 2/9 12/14/2018 11/05/2018 09/13/2018 05/13/2018 04/08/2018 03/10/2018 11/23/2017  Decreased Interest 0 0 0 0 - 0 0  Down, Depressed, Hopeless 0 0 0 0 - 0 0  PHQ - 2 Score 0 0 0 0 - 0 0  Altered sleeping - - - - 0 - -  Tired, decreased energy - - - - - - -  Change in appetite - - - - - - -  Feeling bad or failure about yourself  - - - - - - -  Trouble concentrating - - - - - - -  Moving slowly or fidgety/restless - - - - - - -  Suicidal thoughts - - - - - - -  PHQ-9 Score - - - - - - -  Some recent data might be hidden    Review of Systems  Constitutional: Negative.   HENT: Negative.   Eyes: Negative.   Respiratory: Negative.   Cardiovascular: Negative.   Gastrointestinal: Negative.   Endocrine: Negative.   Genitourinary: Negative.   Musculoskeletal: Positive for gait problem.  Skin: Negative.   Allergic/Immunologic: Negative.   Neurological: Positive for weakness.  Hematological: Negative.   Psychiatric/Behavioral: Negative.   All other systems reviewed and are negative.      Objective:   Physical Exam Vitals signs and nursing note reviewed.  Constitutional:      Appearance: Normal appearance.  Neck:     Musculoskeletal: Normal range of motion and neck supple.     Comments: Cervical Paraspinal Tenderness: C-5- C-6 Cardiovascular:     Rate and Rhythm: Normal rate and regular rhythm.     Pulses: Normal pulses.     Heart sounds: Normal heart sounds.  Pulmonary:     Effort: Pulmonary effort is normal.     Breath sounds: Normal breath sounds.  Musculoskeletal:     Comments: Normal Muscle Bulk and Muscle Testing Reveals:  Upper Extremities: Right: Decreased ROM 45 Degrees and Muscle Strength 5/5    Right AC Joint Tenderness Left: Decreased ROM 90 Degrees  and Muscle Strength 5/5  Thoracic Paraspinal Tenderness: T-7-T-9 mainly right side Lower Extremities: Full ROM and Muscle Strength 5/5 Arises from Table with ease Narrow Based Gait   Skin:    General: Skin is warm and dry.  Neurological:     Mental Status: She is alert and oriented to person, place, and time.  Psychiatric:        Mood and Affect: Mood normal.        Behavior: Behavior normal.           Assessment & Plan:  1.Chronic low back pain withlumbar spondylosis: Continue withSlow Weaning:Continue Oxycodone. 5mg  one tablet twice a day as needed # 45. No script Given. 08/02/2019 We will continue the opioid monitoring program,this consists of regular clinic visits, examinations, urine drug screen, pill counts as well as use of [place] Controlled Substance reporting System. 2. Lumbar Radiculitis: No complaints today. S/PLumbar Radiofrequency with relief noted.Adverse Reaction with Gabapentin and Amitriptyline we willContinue to monitor.08/02/2019 3. Cervicalgia/Cervical Spondylosis/Cervical stenosis with radiculitis/ S/P Anterior Cervical Fusion C3-C4 with Dr. Ellene Route on 10/03/2014.3-level anterior decompression and arthrodesis per [facility] on hold.Continue to Monitor.Dr. Ellene Route Following. 08/02/2019 4. Chronic Right shoulder pain:Continue with Current medication regime.08/02/2019 S/P Left Reverse Total Shoulder Arthroplasty on 02/01/16 via Dr. Veverly Fells. 5. Chronic Right- sided Thoracic Back pain: Continue HEP as Tolerated. Continue to monitor.08/02/2019. 6. Muscle Spasm:No  complaints today.Continueto monitor.08/02/2019. 7. Fatigue: Cardiology Following. Continue to Monitor.   F/U in 1 month  30minutes of face to face patient care time was spent during this visit. All questions were encouraged and answered.

## 2019-08-03 LAB — CBC WITH DIFFERENTIAL/PLATELET
Basophils Absolute: 0.1 10*3/uL (ref 0.0–0.2)
Basos: 1 %
EOS (ABSOLUTE): 0.1 10*3/uL (ref 0.0–0.4)
Eos: 2 %
Hematocrit: 42.3 % (ref 34.0–46.6)
Hemoglobin: 13.9 g/dL (ref 11.1–15.9)
Immature Grans (Abs): 0 10*3/uL (ref 0.0–0.1)
Immature Granulocytes: 0 %
Lymphocytes Absolute: 1.6 10*3/uL (ref 0.7–3.1)
Lymphs: 25 %
MCH: 33 pg (ref 26.6–33.0)
MCHC: 32.9 g/dL (ref 31.5–35.7)
MCV: 101 fL — ABNORMAL HIGH (ref 79–97)
Monocytes Absolute: 0.6 10*3/uL (ref 0.1–0.9)
Monocytes: 9 %
Neutrophils Absolute: 4.2 10*3/uL (ref 1.4–7.0)
Neutrophils: 63 %
Platelets: 295 10*3/uL (ref 150–450)
RBC: 4.21 x10E6/uL (ref 3.77–5.28)
RDW: 13.4 % (ref 11.7–15.4)
WBC: 6.6 10*3/uL (ref 3.4–10.8)

## 2019-08-03 LAB — COMPREHENSIVE METABOLIC PANEL
ALT: 17 IU/L (ref 0–32)
AST: 22 IU/L (ref 0–40)
Albumin/Globulin Ratio: 1.9 (ref 1.2–2.2)
Albumin: 4.3 g/dL (ref 3.6–4.6)
Alkaline Phosphatase: 73 IU/L (ref 39–117)
BUN/Creatinine Ratio: 10 — ABNORMAL LOW (ref 12–28)
BUN: 9 mg/dL (ref 8–27)
Bilirubin Total: 0.6 mg/dL (ref 0.0–1.2)
CO2: 27 mmol/L (ref 20–29)
Calcium: 9.7 mg/dL (ref 8.7–10.3)
Chloride: 98 mmol/L (ref 96–106)
Creatinine, Ser: 0.88 mg/dL (ref 0.57–1.00)
GFR calc Af Amer: 70 mL/min/{1.73_m2} (ref >59)
GFR calc non Af Amer: 61 mL/min/{1.73_m2} (ref >59)
Globulin, Total: 2.3 g/dL (ref 1.5–4.5)
Glucose: 92 mg/dL (ref 65–99)
Potassium: 4.5 mmol/L (ref 3.5–5.2)
Sodium: 137 mmol/L (ref 134–144)
Total Protein: 6.6 g/dL (ref 6.0–8.5)

## 2019-08-03 LAB — TSH: TSH: 2.93 u[IU]/mL (ref 0.450–4.500)

## 2019-08-11 ENCOUNTER — Telehealth: Payer: Self-pay | Admitting: *Deleted

## 2019-08-11 NOTE — Telephone Encounter (Signed)
   [facility] Pre-operative Risk Assessment    Request for surgical clearance:  1. What type of surgery is being performed? CATARACT EXTRACTION W/INTRAOCULAR LENS IMPLANT OF THE RIGHT EYE; FOLLOWED BY THE LEFT EYE   2. When is this surgery scheduled? 08/22/19   3. What type of clearance is required (medical clearance vs. Pharmacy clearance to hold med vs. Both)? MEDICAL  4. Are there any medications that need to be held prior to surgery and how long? PER SURGEON NO MEDICATIONS NEED TO BE HELD   5. Practice name and name of physician performing surgery? [facility]; DR. Talbert Forest   6. What is your office phone number [phone]    7.   What is your office fax number [phone]  8.   Anesthesia type (None, local, MAC, general) ? TOPICAL w/IV MEDICATION    Julaine Hua 08/11/2019, 3:48 PM  _________________________________________________________________   (provider comments below)

## 2019-08-12 NOTE — Telephone Encounter (Signed)
   Primary Cardiologist: Peter Martinique, MD  Chart reviewed as part of pre-operative protocol coverage. Cataract extractions are recognized in guidelines as low risk surgeries that do not typically require specific preoperative testing or holding of blood thinner therapy. Therefore, given past medical history and time since last visit, based on ACC/AHA guidelines, Jenna [name] would be at acceptable risk for the planned procedure without further cardiovascular testing.   I will route this recommendation to the requesting party via Epic fax function and remove from pre-op pool.  Please call with questions.  Kathyrn Drown, NP 08/12/2019, 7:06 AM

## 2019-08-25 ENCOUNTER — Other Ambulatory Visit: Payer: Self-pay

## 2019-08-25 ENCOUNTER — Ambulatory Visit: Payer: Medicare Other | Admitting: Cardiology

## 2019-08-25 ENCOUNTER — Encounter: Payer: Self-pay | Admitting: Cardiology

## 2019-08-25 DIAGNOSIS — I10 Essential (primary) hypertension: Secondary | ICD-10-CM

## 2019-08-25 DIAGNOSIS — T887XXA Unspecified adverse effect of drug or medicament, initial encounter: Secondary | ICD-10-CM

## 2019-08-25 DIAGNOSIS — Z7901 Long term (current) use of anticoagulants: Secondary | ICD-10-CM

## 2019-08-25 DIAGNOSIS — I480 Paroxysmal atrial fibrillation: Secondary | ICD-10-CM | POA: Diagnosis not present

## 2019-08-25 DIAGNOSIS — G8929 Other chronic pain: Secondary | ICD-10-CM

## 2019-08-25 NOTE — Assessment & Plan Note (Signed)
CHADS VASC=4, low dose Eliquis secondary to wgt and age 

## 2019-08-25 NOTE — Progress Notes (Signed)
Cardiology Office Note:    Date:  08/25/2019   ID:  Jenna [name], DOB [birth year], MRN [id number]  PCP:  [name], MD  Cardiologist:  Peter Martinique, MD  Electrophysiologist:  None   Referring MD: [name], MD   Still with multiple complaints she attributes to Amiodarone- no change with dose reduction   History of Present Illness:    Jenna [name] is a 85 y.o. female with a hx of PAFwhich was first noticed in January 2020. The patient reports she thinks it may have actually been present since September 2019 when she noticed fatigue.Her other medical issues include hypertension,treated hypothyroidism, and chronic pain from spinal stenosis. Her CHADS VASC is 4. She is onEliquis2.5 mg twice daily secondary to her age and BMI.   Echocardiogram January 12, 2019 showed an ejection fraction of 45 to 50% with moderate left atrial enlargement. Her cardioversion was delayed secondary to the [place] pandemic. She ultimately underwent OP [facility] 04/29/2019. She converted to sinus rhythm.  She had recurrent PAF 7/09/2020omplained .  Amiodarone was added and she subsequently converted as as OP on Amiodarone.  When seen 06/01/2019 she was in NSR.  She complained of some fatigue and her beta blocker was cut back.  Since then she has complained of "side effects" from the Amiodarone and the dose was cut back to 100 mg daily on 07/05/2019.  She was seen in a virtual visit 07/25/2019. Labs were obtained and were all WNL.  The hope was that she would feel better as her Amiodarone level came down.    She is in the office today for follow up.  She has a list of complaints which she attributes to Amiodarone- constipation, fatigue, "shaky", flushing, poor memory.  She asked about alternatives.   Past Medical History:  Diagnosis Date  . Asthmatic bronchitis   . Atrial fibrillation ([place]) 2019  . Cataracts, bilateral    immature  . Chronic back pain    arthritis.Stenosis  . Dysphagia   .  Dysrhythmia    (?or extra beats) treated /w atenolol  . Emphysema lung ([place])    left  . GERD (gastroesophageal reflux disease)    takes Omeprazole daily  . Hard of hearing    wears hearing aides   . History of blood transfusion    no abnormal reaction noted  . History of gastric ulcer   . Hypercholesteremia    takes Atorvastatin daily  . Hypertension    takes Coreg and Losartan daily  . Hypothyroidism    takes Synthroid daily  . IBS (irritable bowel syndrome)    takes Librax daily  . Joint pain   . Lumbosacral spondylosis without myelopathy   . Melanoma ([place])    on left leg, wide excision   . Muscle spasm    takes Robaxin daily as needed  . Neuromuscular disorder ([place])    esophageal spasms at times   . Osteoarthritis   . Peripheral edema    take HCTZ daily  . Pneumonia    hx of.Many yrs ago  . 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 Seasonal allergies    takes Allegra daily and uses Flonase daily as needed  . Shortness of breath dyspnea    with exertion  . Venous stasis   . Yeast infection    given Diflucan today    Past Surgical History:  Procedure Laterality Date  . ABDOMINAL HYSTERECTOMY  1982  BSO  . ANTERIOR CERVICAL DECOMP/DISCECTOMY FUSION N/A 10/03/2014   Procedure: ANTERIOR CERVICAL DECOMPRESSION/DISCECTOMY FUSION 1 LEVEL,CERVICAL THREE-FOUR;  Surgeon: [name], MD;  Location: [place] [facility];  Service: Neurosurgery;  Laterality: N/A;  . APPENDECTOMY  1986   along with chole  . BREAST BIOPSY Right 1997   sclerosing adenosis -Dr. Lucia Gaskins  . CARDIOVERSION N/A 04/29/2019   Procedure: CARDIOVERSION;  Surgeon: Lelon Perla, MD;  Location: [facility] ENDOSCOPY;  Service: Cardiovascular;  Laterality: N/A;  . CHOLECYSTECTOMY    . COLONOSCOPY    . ESOPHAGOGASTRODUODENOSCOPY     with dilitation  . LIPOMA EXCISION Left 10/1999   left thigh  . lumbosacral cyst  2005   seen on injection for low back pain   . LUNG  BIOPSY  05/2001   /w Dr.Burney- bronchoscopy, mediastinoscopy  . REVERSE SHOULDER ARTHROPLASTY Left 02/01/2016   Procedure: LEFT REVERSE TOTAL SHOULDER ARTHROPLASTY;  Surgeon: [name], MD;  Location: [place];  Service: Orthopedics;  Laterality: Left;  . rotator cuff  Bilateral 1996/1998   repair  . ROTATOR CUFF REPAIR Left 1996; 1981  . ROTATOR CUFF REPAIR Right 1996  . TONSILLECTOMY    . TOTAL HIP ARTHROPLASTY Left 10/2000  . TOTAL HIP ARTHROPLASTY Right 06/2005  . TOTAL HIP ARTHROPLASTY Left    10/2000  . VAGINAL DELIVERY     x1    Current Medications: Current Meds  Medication Sig  . albuterol (PROVENTIL HFA;VENTOLIN HFA) 108 (90 Base) MCG/ACT inhaler Inhale 2 puffs into the lungs every 6 (six) hours as needed for wheezing or shortness of breath.  [name] amiodarone (PACERONE) 200 MG tablet Take 1/2 tablet 100 mg daily  . amLODipine (NORVASC) 2.5 MG tablet Take 2.5 mg by mouth daily.   [name] apixaban (ELIQUIS) 2.5 MG TABS tablet Take 1 tablet (2.5 mg total) by mouth 2 (two) times daily.  [name] atorvastatin (LIPITOR) 20 MG tablet Take 20 mg by mouth daily.  . Calcium Carbonate-Vitamin D (CALCIUM 600+D PO) Take 1 tablet by mouth daily.  . carvedilol (COREG) 6.25 MG tablet Take 1 tablet (6.25 mg total) by mouth 2 (two) times daily with a meal.  . cholecalciferol (VITAMIN D) 1000 UNITS tablet Take 1,000 Units by mouth daily.  . clidinium-chlordiazePOXIDE (LIBRAX) 5-2.5 MG capsule chlordiazepoxide-clidinium 5 mg-2.5 mg capsule  TAKE 1 CAPSULE BY MOUTH EVERY DAY AS DIRECTED  . diazepam (VALIUM) 5 MG tablet Take 2.5 mg by mouth at bedtime as needed for sedation.   . Eflornithine HCl (VANIQA) 13.9 % cream Vaniqa 13.9 % topical cream  APPLY TO AFFECTED AREA TWICE A DAY AS DIRECTED  . fluticasone (FLONASE) 50 MCG/ACT nasal spray Place 2 sprays into both nostrils daily.   . furosemide (LASIX) 20 MG tablet Take 20 mg by mouth daily.   . hyoscyamine (LEVSIN SL) 0.125 MG SL tablet Place 0.125 mg under the  tongue every 4 (four) hours as needed for cramping.   [name] levothyroxine (SYNTHROID, LEVOTHROID) 112 MCG tablet Take 112 mcg by mouth daily before breakfast.  . losartan (COZAAR) 100 MG tablet Take 1 tablet (100 mg total) by mouth daily.  . naphazoline-pheniramine (NAPHCON-A) 0.025-0.3 % ophthalmic solution Place 1 drop into both eyes 4 (four) times daily as needed for eye irritation.  [name] omeprazole (PRILOSEC) 40 MG capsule Take 40 mg by mouth at bedtime.   [name] oxyCODONE (OXY IR/ROXICODONE) 5 MG immediate release tablet Take 1 tablet (5 mg total) by mouth 2 (two) times daily as needed for moderate pain.  [name]  triamcinolone cream (KENALOG) 0.1 % USE AS DIRECTED     Allergies:   Codeine, Pseudoephedrine hcl, Tylenol [acetaminophen], Ultram [tramadol], Vicodin [hydrocodone-acetaminophen], Dalmane [flurazepam hcl], Nortriptyline, Cephalexin, Clarithromycin, Levofloxacin, and Vibramycin [doxycycline calcium]   Social History   Socioeconomic History  . Marital status: Married    Spouse name: Not on file  . Number of children: Not on file  . Years of education: Not on file  . Highest education level: Not on file  Occupational History  . Not on file  Social Needs  . Financial resource strain: Not on file  . Food insecurity    Worry: Not on file    Inability: Not on file  . Transportation needs    Medical: Not on file    Non-medical: Not on file  Tobacco Use  . Smoking status: Former Smoker    Packs/day: 1.00    Years: 15.00    Pack years: 15.00  . Smokeless tobacco: Never Used  . Tobacco comment: quit smoking in 1985  Substance and Sexual Activity  . Alcohol use: No    Frequency: Never  . Drug use: No  . Sexual activity: Not Currently    Partners: Male    Birth control/protection: Surgical    Comment: hysterectomy  Lifestyle  . Physical activity    Days per week: Not on file    Minutes per session: Not on file  . Stress: Not on file  Relationships  . Social Herbalist on  phone: Not on file    Gets together: Not on file    Attends religious service: Not on file    Active member of club or organization: Not on file    Attends meetings of clubs or organizations: Not on file    Relationship status: Not on file  Other Topics Concern  . Not on file  Social History Narrative  . Not on file     Family History: The patient's family history includes Diabetes in her brother; Heart attack in her father; Heart disease in her brother; Stroke in her mother.  ROS:   Please see the history of present illness.     All other systems reviewed and are negative.  EKGs/Labs/Other Studies Reviewed:    The following studies were reviewed today: Echo 01/12/2019-EF 45% , moderate LAE  Recent Labs: 08/02/2019: ALT 17; BUN 9; Creatinine, Ser 0.88; Hemoglobin 13.9; Platelets 295; Potassium 4.5; Sodium 137; TSH 2.930  Recent Lipid Panel No results found for: CHOL, TRIG, HDL, CHOLHDL, VLDL, LDLCALC, LDLDIRECT  Physical Exam:    VS:  BP 134/70   Pulse 67   Temp (!) 97.4 F (36.3 C)   Ht 5' 2.5" (1.588 m)   Wt 131 lb 6.4 oz (59.6 kg)   LMP 11/03/1980 (Approximate)   SpO2 97%   BMI 23.65 kg/m     Wt Readings from Last 3 Encounters:  08/25/19 131 lb 6.4 oz (59.6 kg)  08/02/19 133 lb 9.6 oz (60.6 kg)  07/25/19 133 lb (60.3 kg)     GEN: Thin elderly Caucasian female, well developed in no acute distress HEENT: Normal NECK: No JVD; No carotid bruits LYMPHATICS: No lymphadenopathy CARDIAC: RRR, no murmurs, rubs, gallops RESPIRATORY:  Clear to auscultation without rales, wheezing or rhonchi  ABDOMEN: Soft, non-tender, non-distended MUSCULOSKELETAL:  No edema; No deformity  SKIN: Warm and dry NEUROLOGIC:  Alert and oriented x 3 PSYCHIATRIC:  Normal affect   ASSESSMENT:    Fatigue  Coreg decreased to 6.25  mg BID 06/01/2019.  Pt believes symptoms may be from Amiodarone- dose decreased to 100 mg daily on 07/05/2019.  She now wants to pursue other options for treatment of  her AF.   PAF (paroxysmal atrial fibrillation) ([place]) Diagnosed in Jan 2020 but may have been present since Sept 2020- EF 45-50%, mod LAE. S/P elective OP [facility] 04/27/2019 but recurrent AF with RVR 05/12/2019- Amiodarone added. She converted on Amio to NSR.  Chronic anticoagulation CHADS VASC=4, low dose Eliquis secondary to wgt and age  Hypothyroidism On Synthroid-TSH WNL Jan 2020  Essential hypertension Controlled--EF 45-50% (in AF) with moderate LAE March 2020  Chronic pain- She has spinal stenosis.  She is on OxyContin which she takes twice daily.  I did suggest that this could also cause some of her symptoms but she does not think so.    PLAN:    I discussed her situation with [name].  The plan is to go ahead and stop her Amiodarone.and see how she does.  If she has recurrent symptomatic AF we could consider Tikosyn,  otherwise rate control.  F/U in 6 weeks.   Medication Adjustments/Labs and Tests Ordered: Current medicines are reviewed at length with the patient today.  Concerns regarding medicines are outlined above.  No orders of the defined types were placed in this encounter.  No orders of the defined types were placed in this encounter.   There are no Patient Instructions on file for this visit.   Signed, Kerin Ransom, PA-C  08/25/2019 2:29 PM    [facility]

## 2019-08-25 NOTE — Patient Instructions (Addendum)
Medication Instructions:  STOP AMIODARONE(Pacerone) *If you need a refill on your cardiac medications before your next appointment, please call your pharmacy*  Lab Work: None  If you have labs (blood work) drawn today and your tests are completely normal, you will receive your results only by: [name] MyChart Message (if you have MyChart) OR . A paper copy in the mail If you have any lab test that is abnormal or we need to change your treatment, we will call you to review the results.  Testing/Procedures: None   Follow-Up: At [facility], you and your health needs are our priority.  As part of our continuing mission to provide you with exceptional heart care, we have created designated Provider Care Teams.  These Care Teams include your primary Cardiologist (physician) and Advanced Practice Providers (APPs -  Physician Assistants and Nurse Practitioners) who all work together to provide you with the care you need, when you need it.  Your next appointment:   6 WEEKS  The format for your next appointment:   In Person  Provider:   Peter Martinique, MD  Other Instructions

## 2019-08-25 NOTE — Assessment & Plan Note (Signed)
Multiple side effects the patient attributes to Amiodarone

## 2019-08-25 NOTE — Assessment & Plan Note (Signed)
Controlled-EF 45-50% (in AF) with moderate LAE March 2020 

## 2019-08-25 NOTE — Assessment & Plan Note (Signed)
Diagnosed in Jan 2020 but may have been present since Sept 2020- EF 45-50%, mod LAE.  S/P elective OP DCCV 04/27/2019. Recurrent PAF 05/12/2019- Amio added with NSR documented 06/01/2019 She was in NSR today on exam

## 2019-08-25 NOTE — Assessment & Plan Note (Signed)
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

## 2019-08-26 ENCOUNTER — Other Ambulatory Visit: Payer: Self-pay | Admitting: Cardiology

## 2019-08-26 NOTE — Telephone Encounter (Signed)
°*  STAT* If patient is at the pharmacy, call can be transferred to refill team.   1. Which medications need to be refilled? (please list name of each medication and dose if known)  Carvedilol  2. Which pharmacy/location (including street and city if local pharmacy) is medication to be sent to? [place], [place]  3. Do they need a 30 day or 90 day supply? 180 and refills

## 2019-08-29 MED ORDER — CARVEDILOL 6.25 MG PO TABS: 6.2500 mg | ORAL_TABLET | Freq: Two times a day (BID) | ORAL | 0 refills | Status: DC

## 2019-08-29 NOTE — Telephone Encounter (Signed)
Requested Prescriptions   Signed Prescriptions Disp Refills  . carvedilol (COREG) 6.25 MG tablet 180 tablet 0    Sig: Take 1 tablet (6.25 mg total) by mouth 2 (two) times daily with a meal.    Authorizing Provider: Erlene Quan    Ordering User: Raelene Bott, BRANDY L

## 2019-08-30 ENCOUNTER — Ambulatory Visit: Payer: Medicare Other | Admitting: Registered Nurse

## 2019-09-05 ENCOUNTER — Ambulatory Visit: Payer: Medicare Other | Admitting: Cardiology

## 2019-09-08 ENCOUNTER — Encounter: Payer: Self-pay | Admitting: Registered Nurse

## 2019-09-08 ENCOUNTER — Other Ambulatory Visit: Payer: Self-pay

## 2019-09-08 ENCOUNTER — Encounter: Payer: Medicare Other | Attending: Physical Medicine and Rehabilitation | Admitting: Registered Nurse

## 2019-09-08 VITALS — BP 109/67 | HR 63 | Temp 97.7°F | Ht 62.5 in | Wt 129.0 lb

## 2019-09-08 DIAGNOSIS — M12812 Other specific arthropathies, not elsewhere classified, left shoulder: Secondary | ICD-10-CM | POA: Diagnosis present

## 2019-09-08 DIAGNOSIS — M47812 Spondylosis without myelopathy or radiculopathy, cervical region: Secondary | ICD-10-CM | POA: Insufficient documentation

## 2019-09-08 DIAGNOSIS — Z981 Arthrodesis status: Secondary | ICD-10-CM | POA: Diagnosis not present

## 2019-09-08 DIAGNOSIS — Z79899 Other long term (current) drug therapy: Secondary | ICD-10-CM | POA: Diagnosis present

## 2019-09-08 DIAGNOSIS — M4712 Other spondylosis with myelopathy, cervical region: Secondary | ICD-10-CM

## 2019-09-08 DIAGNOSIS — M12811 Other specific arthropathies, not elsewhere classified, right shoulder: Secondary | ICD-10-CM | POA: Insufficient documentation

## 2019-09-08 DIAGNOSIS — Z79891 Long term (current) use of opiate analgesic: Secondary | ICD-10-CM | POA: Diagnosis not present

## 2019-09-08 DIAGNOSIS — Z5181 Encounter for therapeutic drug level monitoring: Secondary | ICD-10-CM | POA: Diagnosis present

## 2019-09-08 DIAGNOSIS — M47817 Spondylosis without myelopathy or radiculopathy, lumbosacral region: Secondary | ICD-10-CM

## 2019-09-08 DIAGNOSIS — M47816 Spondylosis without myelopathy or radiculopathy, lumbar region: Secondary | ICD-10-CM | POA: Insufficient documentation

## 2019-09-08 DIAGNOSIS — M546 Pain in thoracic spine: Secondary | ICD-10-CM

## 2019-09-08 DIAGNOSIS — G894 Chronic pain syndrome: Secondary | ICD-10-CM

## 2019-09-08 DIAGNOSIS — G8929 Other chronic pain: Secondary | ICD-10-CM

## 2019-09-08 MED ORDER — OXYCODONE HCL 5 MG PO TABS: 5.0000 mg | ORAL_TABLET | Freq: Two times a day (BID) | ORAL | 0 refills | Status: DC | PRN

## 2019-09-08 NOTE — Progress Notes (Signed)
Subjective:    Patient ID: Jenna [name], female    DOB: [birth year], 85 y.o.   MRN: [id number]  HPI: Jenna [name] is a 85 y.o. female who returns for follow up appointment for chronic pain and medication refill. She states her pain is located in her mid- lower back pain. She rates her pain 2. Her current exercise regime is walking and performing stretching exercises.  [name] Morphine equivalent is 14.67  MME.She is also prescribed Diazepam she's using it sparingly last prescription filled on 07/25/2019 by Dr. Mancel Bale. We have discussed the black box warning of using opioids and benzodiazepines. I highlighted the dangers of using these drugs together and discussed the adverse events including respiratory suppression, overdose, cognitive impairment and importance of compliance with current regimen. We will continue to monitor and adjust as indicated.   Oral Swab was Performed today.    Pain Inventory Average Pain 4 Pain Right Now 2 My pain is .  In the last 24 hours, has pain interfered with the following? General activity 5 Relation with others 4 Enjoyment of life 5 What TIME of day is your pain at its worst? evening Sleep (in general) Good  Pain is worse with: standing and some activites Pain improves with: rest, pacing activities and medication Relief from Meds: 5  Mobility walk without assistance use a cane ability to climb steps?  yes do you drive?  yes  Function retired  Neuro/Psych weakness  Prior Studies Any changes since last visit?  no  Physicians involved in your care Any changes since last visit?  no   Family History  Problem Relation Age of Onset  . Stroke Mother   . Heart attack Father   . Diabetes Brother   . Heart disease Brother    Social History   Socioeconomic History  . Marital status: Married    Spouse name: Not on file  . Number of children: Not on file  . Years of education: Not on file  . Highest education level: Not on file   Occupational History  . Not on file  Social Needs  . Financial resource strain: Not on file  . Food insecurity    Worry: Not on file    Inability: Not on file  . Transportation needs    Medical: Not on file    Non-medical: Not on file  Tobacco Use  . Smoking status: Former Smoker    Packs/day: 1.00    Years: 15.00    Pack years: 15.00  . Smokeless tobacco: Never Used  . Tobacco comment: quit smoking in 1985  Substance and Sexual Activity  . Alcohol use: No    Frequency: Never  . Drug use: No  . Sexual activity: Not Currently    Partners: Male    Birth control/protection: Surgical    Comment: hysterectomy  Lifestyle  . Physical activity    Days per week: Not on file    Minutes per session: Not on file  . Stress: Not on file  Relationships  . Social Herbalist on phone: Not on file    Gets together: Not on file    Attends religious service: Not on file    Active member of club or organization: Not on file    Attends meetings of clubs or organizations: Not on file    Relationship status: Not on file  Other Topics Concern  . Not on file  Social History Narrative  . Not on file  Past Surgical History:  Procedure Laterality Date  . ABDOMINAL HYSTERECTOMY  1982   BSO  . ANTERIOR CERVICAL DECOMP/DISCECTOMY FUSION N/A 10/03/2014   Procedure: ANTERIOR CERVICAL DECOMPRESSION/DISCECTOMY FUSION 1 LEVEL,CERVICAL THREE-FOUR;  Surgeon: [name], MD;  Location: [place] [facility];  Service: Neurosurgery;  Laterality: N/A;  . APPENDECTOMY  1986   along with chole  . BREAST BIOPSY Right 1997   sclerosing adenosis -Dr. Lucia Gaskins  . CARDIOVERSION N/A 04/29/2019   Procedure: CARDIOVERSION;  Surgeon: Lelon Perla, MD;  Location: [facility] ENDOSCOPY;  Service: Cardiovascular;  Laterality: N/A;  . CHOLECYSTECTOMY    . COLONOSCOPY    . ESOPHAGOGASTRODUODENOSCOPY     with dilitation  . LIPOMA EXCISION Left 10/1999   left thigh  . lumbosacral cyst  2005   seen on injection for  low back pain   . LUNG BIOPSY  05/2001   /w Dr.Burney- bronchoscopy, mediastinoscopy  . REVERSE SHOULDER ARTHROPLASTY Left 02/01/2016   Procedure: LEFT REVERSE TOTAL SHOULDER ARTHROPLASTY;  Surgeon: [name], MD;  Location: [facility];  Service: Orthopedics;  Laterality: Left;  . rotator cuff  Bilateral 1996/1998   repair  . ROTATOR CUFF REPAIR Left 1996; 1981  . ROTATOR CUFF REPAIR Right 1996  . TONSILLECTOMY    . TOTAL HIP ARTHROPLASTY Left 10/2000  . TOTAL HIP ARTHROPLASTY Right 06/2005  . TOTAL HIP ARTHROPLASTY Left    10/2000  . VAGINAL DELIVERY     x1   Past Medical History:  Diagnosis Date  . Asthmatic bronchitis   . Atrial fibrillation ([place]) 2019  . Cataracts, bilateral    immature  . Chronic back pain    arthritis.Stenosis  . Dysphagia   . Dysrhythmia    (?or extra beats) treated /w atenolol  . Emphysema lung ([place])    left  . GERD (gastroesophageal reflux disease)    takes Omeprazole daily  . Hard of hearing    wears hearing aides   . History of blood transfusion    no abnormal reaction noted  . History of gastric ulcer   . Hypercholesteremia    takes Atorvastatin daily  . Hypertension    takes Coreg and Losartan daily  . Hypothyroidism    takes Synthroid daily  . IBS (irritable bowel syndrome)    takes Librax daily  . Joint pain   . Lumbosacral spondylosis without myelopathy   . Melanoma ([place])    on left leg, wide excision   . Muscle spasm    takes Robaxin daily as needed  . Neuromuscular disorder ([place])    esophageal spasms at times   . Osteoarthritis   . Peripheral edema    take HCTZ daily  . Pneumonia    hx of.Many yrs ago  . 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 Seasonal allergies    takes Allegra daily and uses Flonase daily as needed  . Shortness of breath dyspnea    with exertion  . Venous stasis   . Yeast infection    given Diflucan today   BP 109/67   Pulse 63   Temp 97.7 F (36.5 C)    Ht 5' 2.5" (1.588 m)   Wt 129 lb (58.5 kg)   LMP 11/03/1980 (Approximate)   SpO2 98%   BMI 23.22 kg/m   Opioid Risk Score:   Fall Risk Score:  `1  Depression screen [facility] 2/9  Depression screen [facility] 2/9 12/14/2018 11/05/2018 09/13/2018 05/13/2018 04/08/2018 03/10/2018 11/23/2017  Decreased Interest 0 0 0 0 - 0 0  Down, Depressed, Hopeless 0 0 0 0 - 0 0  PHQ - 2 Score 0 0 0 0 - 0 0  Altered sleeping - - - - 0 - -  Tired, decreased energy - - - - - - -  Change in appetite - - - - - - -  Feeling bad or failure about yourself  - - - - - - -  Trouble concentrating - - - - - - -  Moving slowly or fidgety/restless - - - - - - -  Suicidal thoughts - - - - - - -  PHQ-9 Score - - - - - - -  Some recent data might be hidden     Review of Systems  Constitutional: Negative.   HENT: Negative.   Eyes: Negative.   Respiratory: Negative.   Cardiovascular: Negative.   Gastrointestinal: Negative.   Endocrine: Negative.   Genitourinary: Negative.   Musculoskeletal: Positive for arthralgias, back pain, gait problem and myalgias.  Skin: Negative.   Allergic/Immunologic: Negative.   Neurological: Positive for weakness.  Hematological: Negative.   Psychiatric/Behavioral: Negative.   All other systems reviewed and are negative.      Objective:   Physical Exam Vitals signs and nursing note reviewed.  Constitutional:      Appearance: Normal appearance.  Neck:     Musculoskeletal: Normal range of motion and neck supple.  Cardiovascular:     Rate and Rhythm: Normal rate and regular rhythm.     Pulses: Normal pulses.     Heart sounds: Normal heart sounds.  Pulmonary:     Effort: Pulmonary effort is normal.     Breath sounds: Normal breath sounds.  Musculoskeletal:     Comments: Normal Muscle Bulk and Muscle Testing Reveals:  Upper Extremities: Full ROM and Muscle Strength 5/5 Thoracic Paraspinal Tenderness: T-7-T-9  Lower Extremities: Full ROM and Muscle Strength 5/5 Arises from Table with  ease Narrow Based  Gait   Skin:    General: Skin is warm and dry.  Neurological:     Mental Status: She is alert and oriented to person, place, and time.  Psychiatric:        Mood and Affect: Mood normal.        Behavior: Behavior normal.           Assessment & Plan:  1.Chronic low back pain withlumbar spondylosis: Continue withSlow Weaning:ContinueOxycodone. 5mg  one tablet twice a day as needed # 45.09/08/2019 We will continue the opioid monitoring program,this consists of regular clinic visits, examinations, urine drug screen, pill counts as well as use of [place] Controlled Substance reporting System. 2. Lumbar Radiculitis: No complaints today. S/PLumbar Radiofrequency with relief noted.Adverse Reaction with Gabapentin and Amitriptyline we willContinue to monitor.09/08/2019 3. Cervicalgia/Cervical Spondylosis/Cervical stenosis with radiculitis/ S/P Anterior Cervical Fusion C3-C4 with Dr. Ellene Route on 10/03/2014.3-level anterior decompression and arthrodesis per [facility] on hold.Continue to Monitor.Dr. Ellene Route Following. 09/08/2019 4. ChronicRightshoulder pain:Continue with Current medication regime.09/08/2019 S/P Left Reverse Total Shoulder Arthroplasty on 02/01/16 via Dr. Veverly Fells. 5. Chronic Right- sided Thoracic Back pain: Continue HEP as Tolerated. Continue to monitor.09/08/2019. 6. Muscle Spasm:No complaints today.Continueto monitor.09/08/2019. 7. Fatigue: No complaints today. Cardiology Following. Continue to Monitor.   F/U in 1 month  15 minutes of face to face patient care time was spent during this visit. All questions were encouraged and answered.

## 2019-09-13 LAB — DRUG TOX MONITOR 1 W/CONF, ORAL FLD
Alprazolam: NEGATIVE ng/mL (ref <0.50)
Amphetamines: NEGATIVE ng/mL (ref <10)
Barbiturates: NEGATIVE ng/mL (ref <10)
Benzodiazepines: POSITIVE ng/mL — AB (ref <0.50)
Buprenorphine: NEGATIVE ng/mL (ref <0.10)
Chlordiazepoxide: NEGATIVE ng/mL (ref <0.50)
Clonazepam: NEGATIVE ng/mL (ref <0.50)
Cocaine: NEGATIVE ng/mL (ref <5.0)
Codeine: NEGATIVE ng/mL (ref <2.5)
Diazepam: 1.18 ng/mL — ABNORMAL HIGH (ref <0.50)
Dihydrocodeine: NEGATIVE ng/mL (ref <2.5)
Fentanyl: NEGATIVE ng/mL (ref <0.10)
Flunitrazepam: NEGATIVE ng/mL (ref <0.50)
Flurazepam: NEGATIVE ng/mL (ref <0.50)
Heroin Metabolite: NEGATIVE ng/mL (ref <1.0)
Hydrocodone: NEGATIVE ng/mL (ref <2.5)
Hydromorphone: NEGATIVE ng/mL (ref <2.5)
Lorazepam: NEGATIVE ng/mL (ref <0.50)
MARIJUANA: NEGATIVE ng/mL (ref <2.5)
MDMA: NEGATIVE ng/mL (ref <10)
Meprobamate: NEGATIVE ng/mL (ref <2.5)
Methadone: NEGATIVE ng/mL (ref <5.0)
Midazolam: NEGATIVE ng/mL (ref <0.50)
Morphine: NEGATIVE ng/mL (ref <2.5)
Nicotine Metabolite: NEGATIVE ng/mL (ref <5.0)
Nordiazepam: 2.04 ng/mL — ABNORMAL HIGH (ref <0.50)
Norhydrocodone: NEGATIVE ng/mL (ref <2.5)
Noroxycodone: 45 ng/mL — ABNORMAL HIGH (ref <2.5)
Opiates: POSITIVE ng/mL — AB (ref <2.5)
Oxazepam: NEGATIVE ng/mL (ref <0.50)
Oxycodone: 111.6 ng/mL — ABNORMAL HIGH (ref <2.5)
Oxymorphone: NEGATIVE ng/mL (ref <2.5)
Phencyclidine: NEGATIVE ng/mL (ref <10)
Tapentadol: NEGATIVE ng/mL (ref <5.0)
Temazepam: NEGATIVE ng/mL (ref <0.50)
Tramadol: NEGATIVE ng/mL (ref <5.0)
Triazolam: NEGATIVE ng/mL (ref <0.50)
Zolpidem: NEGATIVE ng/mL (ref <5.0)

## 2019-09-13 LAB — DRUG TOX ALC METAB W/CON, ORAL FLD: Alcohol Metabolite: NEGATIVE ng/mL (ref <25)

## 2019-09-15 ENCOUNTER — Telehealth: Payer: Self-pay | Admitting: *Deleted

## 2019-09-15 NOTE — Telephone Encounter (Signed)
Oral swab drug screen was consistent for prescribed medications.  ?

## 2019-10-12 ENCOUNTER — Other Ambulatory Visit: Payer: Self-pay | Admitting: Internal Medicine

## 2019-10-12 DIAGNOSIS — R109 Unspecified abdominal pain: Secondary | ICD-10-CM

## 2019-10-18 ENCOUNTER — Ambulatory Visit: Payer: Medicare Other | Admitting: Registered Nurse

## 2019-10-18 NOTE — Progress Notes (Signed)
Cardiology Office Note:    Date:  10/18/2019   ID:  Jenna [name], DOB Nov 09, 1932, MRN [id number]  PCP:  [name], MD  Cardiologist:  Mckena Chern [place], MD  Electrophysiologist:  None   Referring MD: [name], MD   Still with multiple complaints she attributes to Amiodarone- no change with dose reduction   History of Present Illness:    Jenna [name] is a 85 y.o. female with a hx of PAFwhich was first noticed in January 2020. The patient reports she thinks it may have actually been present since September 2019 when she noticed fatigue.Her other medical issues include hypertension,treated hypothyroidism, and chronic pain from spinal stenosis. Her CHADS VASC is 4. She is onEliquis2.5 mg twice daily secondary to her age and BMI.   Echocardiogram January 12, 2019 showed an ejection fraction of 45 to 50% with moderate left atrial enlargement. Her cardioversion was delayed secondary to the [place] pandemic. She ultimately underwent OP DCCV 04/29/2019. She converted to sinus rhythm.  She had recurrent PAF 05/12/2019.  Amiodarone was added and she subsequently converted as as OP on Amiodarone.  When seen 06/01/2019 she was in NSR.  She complained of some fatigue and her beta blocker was cut back.  Since then she has complained of "side effects" from the Amiodarone and the dose was cut back to 100 mg daily on 07/05/2019. Follow up labs were normal. She continued to complain of side effects of amiodarone including constipation, fatigue, flushing, and feeling shaky. Amiodarone was discontinued in October. Consideration for Tikosyn if Afib recurs.   She is still having a lot of symptoms off the Amiodarone. Constipation, memory loss, fatigue. Hot flashes. She does take oxycodone for back pain.   Past Medical History:  Diagnosis Date  . Asthmatic bronchitis   . Atrial fibrillation ([place]) 2019  . Cataracts, bilateral    immature  . Chronic back pain    arthritis.Stenosis  . Dysphagia    . Dysrhythmia    (?or extra beats) treated /w atenolol  . Emphysema lung ([place])    left  . GERD (gastroesophageal reflux disease)    takes Omeprazole daily  . Hard of hearing    wears hearing aides   . History of blood transfusion    no abnormal reaction noted  . History of gastric ulcer   . Hypercholesteremia    takes Atorvastatin daily  . Hypertension    takes Coreg and Losartan daily  . Hypothyroidism    takes Synthroid daily  . IBS (irritable bowel syndrome)    takes Librax daily  . Joint pain   . Lumbosacral spondylosis without myelopathy   . Melanoma ([place])    on left leg, wide excision   . Muscle spasm    takes Robaxin daily as needed  . Neuromuscular disorder ([place])    esophageal spasms at times   . Osteoarthritis   . Peripheral edema    take HCTZ daily  . Pneumonia    hx of.Many yrs ago  . 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 Seasonal allergies    takes Allegra daily and uses Flonase daily as needed  . Shortness of breath dyspnea    with exertion  . Venous stasis   . Yeast infection    given Diflucan today    Past Surgical History:  Procedure Laterality Date  . ABDOMINAL HYSTERECTOMY  1982   BSO  . ANTERIOR CERVICAL DECOMP/DISCECTOMY FUSION N/A 10/03/2014  Procedure: ANTERIOR CERVICAL DECOMPRESSION/DISCECTOMY FUSION 1 LEVEL,CERVICAL THREE-FOUR;  Surgeon: [name], MD;  Location: [place] NEURO ORS;  Service: Neurosurgery;  Laterality: N/A;  . APPENDECTOMY  1986   along with chole  . BREAST BIOPSY Right 1997   sclerosing adenosis -Dr. Lucia Gaskins  . CARDIOVERSION N/A 04/29/2019   Procedure: CARDIOVERSION;  Surgeon: Lelon Perla, MD;  Location: [facility] ENDOSCOPY;  Service: Cardiovascular;  Laterality: N/A;  . CHOLECYSTECTOMY    . COLONOSCOPY    . ESOPHAGOGASTRODUODENOSCOPY     with dilitation  . LIPOMA EXCISION Left 10/1999   left thigh  . lumbosacral cyst  2005   seen on injection for low back pain   .  LUNG BIOPSY  05/2001   /w Dr.Burney- bronchoscopy, mediastinoscopy  . REVERSE SHOULDER ARTHROPLASTY Left 02/01/2016   Procedure: LEFT REVERSE TOTAL SHOULDER ARTHROPLASTY;  Surgeon: [name], MD;  Location: [facility];  Service: Orthopedics;  Laterality: Left;  . rotator cuff  Bilateral 1996/1998   repair  . ROTATOR CUFF REPAIR Left 1996; 1981  . ROTATOR CUFF REPAIR Right 1996  . TONSILLECTOMY    . TOTAL HIP ARTHROPLASTY Left 10/2000  . TOTAL HIP ARTHROPLASTY Right 06/2005  . TOTAL HIP ARTHROPLASTY Left    10/2000  . VAGINAL DELIVERY     x1    Current Medications: No outpatient medications have been marked as taking for the 10/20/19 encounter (Appointment) with [place], Fabrizio Filip M, MD.     Allergies:   Codeine, Pseudoephedrine hcl, Tylenol [acetaminophen], Ultram [tramadol], Vicodin [hydrocodone-acetaminophen], Dalmane [flurazepam hcl], Nortriptyline, Cephalexin, Clarithromycin, Levofloxacin, and Vibramycin [doxycycline calcium]   Social History   Socioeconomic History  . Marital status: Married    Spouse name: Not on file  . Number of children: Not on file  . Years of education: Not on file  . Highest education level: Not on file  Occupational History  . Not on file  Tobacco Use  . Smoking status: Former Smoker    Packs/day: 1.00    Years: 15.00    Pack years: 15.00  . Smokeless tobacco: Never Used  . Tobacco comment: quit smoking in 1985  Substance and Sexual Activity  . Alcohol use: No  . Drug use: No  . Sexual activity: Not Currently    Partners: Male    Birth control/protection: Surgical    Comment: hysterectomy  Other Topics Concern  . Not on file  Social History Narrative  . Not on file   Social Determinants of Health   Financial Resource Strain:   . Difficulty of Paying Living Expenses: Not on file  Food Insecurity:   . Worried About Charity fundraiser in the Last Year: Not on file  . Ran Out of Food in the Last Year: Not on file  Transportation Needs:   .  Lack of Transportation (Medical): Not on file  . Lack of Transportation (Non-Medical): Not on file  Physical Activity:   . Days of Exercise per Week: Not on file  . Minutes of Exercise per Session: Not on file  Stress:   . Feeling of Stress : Not on file  Social Connections:   . Frequency of Communication with Friends and Family: Not on file  . Frequency of Social Gatherings with Friends and Family: Not on file  . Attends Religious Services: Not on file  . Active Member of Clubs or Organizations: Not on file  . Attends Archivist Meetings: Not on file  . Marital Status: Not on file  Family History: The patient's family history includes Diabetes in her brother; Heart attack in her father; Heart disease in her brother; Stroke in her mother.  ROS:   Please see the history of present illness.     All other systems reviewed and are negative.  EKGs/Labs/Other Studies Reviewed:    The following studies were reviewed today: Echo 01/12/2019-EF 45% , moderate LAE  Recent Labs: 08/02/2019: ALT 17; BUN 9; Creatinine, Ser 0.88; Hemoglobin 13.9; Platelets 295; Potassium 4.5; Sodium 137; TSH 2.930  Recent Lipid Panel No results found for: CHOL, TRIG, HDL, CHOLHDL, VLDL, LDLCALC, LDLDIRECT  Physical Exam:    VS:  LMP 11/03/1980 (Approximate)     Wt Readings from Last 3 Encounters:  09/08/19 129 lb (58.5 kg)  08/25/19 131 lb 6.4 oz (59.6 kg)  08/02/19 133 lb 9.6 oz (60.6 kg)     GEN: Thin elderly Caucasian female, well developed in no acute distress. Pulse is regular.  HEENT: Normal NECK: No JVD; No carotid bruits LYMPHATICS: No lymphadenopathy CARDIAC: RRR, no murmurs, rubs, gallops RESPIRATORY:  Clear to auscultation without rales, wheezing or rhonchi  ABDOMEN: Soft, non-tender, non-distended MUSCULOSKELETAL:  No edema; No deformity  SKIN: Warm and dry NEUROLOGIC:  Alert and oriented x 3 PSYCHIATRIC:  Normal affect   ASSESSMENT:    1.  PAF (paroxysmal atrial  fibrillation) ([place]) Diagnosed in Jan 2020 but may have been present since Sept 2020- EF 45-50%, mod LAE. S/P elective OP DCCV 04/27/2019 but recurrent AF with RVR 05/12/2019- Amiodarone added. She converted on Amio to NSR. Intolerant side effects of Amiodarone which was discontinued in October. No recurrent Afib to date.  2. Chronic anticoagulation CHADS VASC=4, low dose Eliquis secondary to wgt and age  37. Hypothyroidism On Synthroid-TSH WNL Jan 2020  4. Essential hypertension Controlled--EF 45-50% (in AF) with moderate LAE March 2020    PLAN:    Will continue to monitor. Follow up in 6 months.    Medication Adjustments/Labs and Tests Ordered: Current medicines are reviewed at length with the patient today.  Concerns regarding medicines are outlined above.  No orders of the defined types were placed in this encounter.  No orders of the defined types were placed in this encounter.   There are no Patient Instructions on file for this visit.   Signed, Migdalia Olejniczak [place], MD  10/18/2019 3:51 PM    [facility]

## 2019-10-20 ENCOUNTER — Ambulatory Visit: Payer: Medicare Other | Admitting: Cardiology

## 2019-10-20 ENCOUNTER — Encounter: Payer: Self-pay | Admitting: Cardiology

## 2019-10-20 ENCOUNTER — Other Ambulatory Visit: Payer: Self-pay

## 2019-10-20 VITALS — BP 146/75 | HR 69 | Ht 62.5 in | Wt 134.0 lb

## 2019-10-20 DIAGNOSIS — I480 Paroxysmal atrial fibrillation: Secondary | ICD-10-CM | POA: Diagnosis not present

## 2019-10-20 DIAGNOSIS — Z7901 Long term (current) use of anticoagulants: Secondary | ICD-10-CM

## 2019-10-20 DIAGNOSIS — I10 Essential (primary) hypertension: Secondary | ICD-10-CM

## 2019-10-24 ENCOUNTER — Ambulatory Visit: Payer: Medicare Other | Admitting: Registered Nurse

## 2019-10-24 ENCOUNTER — Ambulatory Visit
Admission: RE | Admit: 2019-10-24 | Discharge: 2019-10-24 | Disposition: A | Discharge status: Home / Self Care | Payer: Medicare Other | Source: Ambulatory Visit | Attending: Internal Medicine | Admitting: Internal Medicine

## 2019-10-24 ENCOUNTER — Encounter: Payer: Medicare Other | Attending: Physical Medicine and Rehabilitation | Admitting: Registered Nurse

## 2019-10-24 ENCOUNTER — Encounter: Payer: Self-pay | Admitting: Registered Nurse

## 2019-10-24 ENCOUNTER — Other Ambulatory Visit: Payer: Self-pay

## 2019-10-24 VITALS — BP 138/70 | HR 71 | Temp 97.9°F | Ht 62.0 in | Wt 133.0 lb

## 2019-10-24 DIAGNOSIS — M47816 Spondylosis without myelopathy or radiculopathy, lumbar region: Secondary | ICD-10-CM | POA: Diagnosis present

## 2019-10-24 DIAGNOSIS — M47812 Spondylosis without myelopathy or radiculopathy, cervical region: Secondary | ICD-10-CM | POA: Diagnosis present

## 2019-10-24 DIAGNOSIS — Z981 Arthrodesis status: Secondary | ICD-10-CM | POA: Diagnosis not present

## 2019-10-24 DIAGNOSIS — Z79891 Long term (current) use of opiate analgesic: Secondary | ICD-10-CM | POA: Diagnosis present

## 2019-10-24 DIAGNOSIS — M12811 Other specific arthropathies, not elsewhere classified, right shoulder: Secondary | ICD-10-CM | POA: Diagnosis present

## 2019-10-24 DIAGNOSIS — M12812 Other specific arthropathies, not elsewhere classified, left shoulder: Secondary | ICD-10-CM | POA: Diagnosis present

## 2019-10-24 DIAGNOSIS — M47817 Spondylosis without myelopathy or radiculopathy, lumbosacral region: Secondary | ICD-10-CM | POA: Diagnosis not present

## 2019-10-24 DIAGNOSIS — G8929 Other chronic pain: Secondary | ICD-10-CM

## 2019-10-24 DIAGNOSIS — Z5181 Encounter for therapeutic drug level monitoring: Secondary | ICD-10-CM

## 2019-10-24 DIAGNOSIS — M546 Pain in thoracic spine: Secondary | ICD-10-CM

## 2019-10-24 DIAGNOSIS — R109 Unspecified abdominal pain: Secondary | ICD-10-CM

## 2019-10-24 DIAGNOSIS — G894 Chronic pain syndrome: Secondary | ICD-10-CM

## 2019-10-24 DIAGNOSIS — M4712 Other spondylosis with myelopathy, cervical region: Secondary | ICD-10-CM | POA: Diagnosis not present

## 2019-10-24 DIAGNOSIS — Z79899 Other long term (current) drug therapy: Secondary | ICD-10-CM | POA: Diagnosis present

## 2019-10-24 MED ORDER — OXYCODONE HCL 5 MG PO TABS: 5.0000 mg | ORAL_TABLET | Freq: Two times a day (BID) | ORAL | 0 refills | Status: DC | PRN

## 2019-10-24 NOTE — Progress Notes (Signed)
Subjective:    Patient ID: Jenna [name], female    DOB: 04-30-1933, 85 y.o.   MRN: [id number]  HPI: Jenna [name] is a 85 y.o. female who returns for follow up appointment for chronic pain and medication refill. She states her pain is located in her mid- lower back pain. She rates her pain 3. Her current exercise regime is walking.  Ms. Litzenberg Morphine equivalent is 14.67  MME. She  is also prescribed Diazepam by Dr. Mancel Bale. We have discussed the black box warning of using opioids and benzodiazepines. I highlighted the dangers of using these drugs together and discussed the adverse events including respiratory suppression, overdose, cognitive impairment and importance of compliance with current regimen. We will continue to monitor and adjust as indicated.   Last Oral Swab was Performed on 09/08/2019, it was consistent.   Pain Inventory Average Pain 5 Pain Right Now 3 My pain is intermittent, sharp and aching  In the last 24 hours, has pain interfered with the following? General activity 4 Relation with others 2 Enjoyment of life 3 What TIME of day is your pain at its worst? daytime Sleep (in general) Fair  Pain is worse with: standing and some activites Pain improves with: rest, pacing activities and medication Relief from Meds: 6  Mobility use a cane ability to climb steps?  yes do you drive?  yes  Function retired Do you have any goals in this area?  no  Neuro/Psych weakness trouble walking dizziness  Prior Studies Any changes since last visit?  no  Physicians involved in your care Any changes since last visit?  no   Family History  Problem Relation Age of Onset  . Stroke Mother   . Heart attack Father   . Diabetes Brother   . Heart disease Brother    Social History   Socioeconomic History  . Marital status: Married    Spouse name: Not on file  . Number of children: Not on file  . Years of education: Not on file  . Highest education level: Not on  file  Occupational History  . Not on file  Tobacco Use  . Smoking status: Former Smoker    Packs/day: 1.00    Years: 15.00    Pack years: 15.00  . Smokeless tobacco: Never Used  . Tobacco comment: quit smoking in 1985  Substance and Sexual Activity  . Alcohol use: No  . Drug use: No  . Sexual activity: Not Currently    Partners: Male    Birth control/protection: Surgical    Comment: hysterectomy  Other Topics Concern  . Not on file  Social History Narrative  . Not on file   Social Determinants of Health   Financial Resource Strain:   . Difficulty of Paying Living Expenses: Not on file  Food Insecurity:   . Worried About Charity fundraiser in the Last Year: Not on file  . Ran Out of Food in the Last Year: Not on file  Transportation Needs:   . Lack of Transportation (Medical): Not on file  . Lack of Transportation (Non-Medical): Not on file  Physical Activity:   . Days of Exercise per Week: Not on file  . Minutes of Exercise per Session: Not on file  Stress:   . Feeling of Stress : Not on file  Social Connections:   . Frequency of Communication with Friends and Family: Not on file  . Frequency of Social Gatherings with Friends and Family: Not on  file  . Attends Religious Services: Not on file  . Active Member of Clubs or Organizations: Not on file  . Attends Archivist Meetings: Not on file  . Marital Status: Not on file   Past Surgical History:  Procedure Laterality Date  . ABDOMINAL HYSTERECTOMY  1982   BSO  . ANTERIOR CERVICAL DECOMP/DISCECTOMY FUSION N/A 10/03/2014   Procedure: ANTERIOR CERVICAL DECOMPRESSION/DISCECTOMY FUSION 1 LEVEL,CERVICAL THREE-FOUR;  Surgeon: [name], MD;  Location: [place] [facility];  Service: Neurosurgery;  Laterality: N/A;  . APPENDECTOMY  1986   along with chole  . BREAST BIOPSY Right 1997   sclerosing adenosis -Dr. Lucia Gaskins  . CARDIOVERSION N/A 04/29/2019   Procedure: CARDIOVERSION;  Surgeon: Lelon Perla, MD;   Location: [facility] ENDOSCOPY;  Service: Cardiovascular;  Laterality: N/A;  . CHOLECYSTECTOMY    . COLONOSCOPY    . ESOPHAGOGASTRODUODENOSCOPY     with dilitation  . LIPOMA EXCISION Left 10/1999   left thigh  . lumbosacral cyst  2005   seen on injection for low back pain   . LUNG BIOPSY  05/2001   /w Dr.Burney- bronchoscopy, mediastinoscopy  . REVERSE SHOULDER ARTHROPLASTY Left 02/01/2016   Procedure: LEFT REVERSE TOTAL SHOULDER ARTHROPLASTY;  Surgeon: [name], MD;  Location: [place];  Service: Orthopedics;  Laterality: Left;  . rotator cuff  Bilateral 1996/1998   repair  . ROTATOR CUFF REPAIR Left 1996; 1981  . ROTATOR CUFF REPAIR Right 1996  . TONSILLECTOMY    . TOTAL HIP ARTHROPLASTY Left 10/2000  . TOTAL HIP ARTHROPLASTY Right 06/2005  . TOTAL HIP ARTHROPLASTY Left    10/2000  . VAGINAL DELIVERY     x1   Past Medical History:  Diagnosis Date  . Asthmatic bronchitis   . Atrial fibrillation ([place]) 2019  . Cataracts, bilateral    immature  . Chronic back pain    arthritis.Stenosis  . Dysphagia   . Dysrhythmia    (?or extra beats) treated /w atenolol  . Emphysema lung ([place])    left  . GERD (gastroesophageal reflux disease)    takes Omeprazole daily  . Hard of hearing    wears hearing aides   . History of blood transfusion    no abnormal reaction noted  . History of gastric ulcer   . Hypercholesteremia    takes Atorvastatin daily  . Hypertension    takes Coreg and Losartan daily  . Hypothyroidism    takes Synthroid daily  . IBS (irritable bowel syndrome)    takes Librax daily  . Joint pain   . Lumbosacral spondylosis without myelopathy   . Melanoma ([place])    on left leg, wide excision   . Muscle spasm    takes Robaxin daily as needed  . Neuromuscular disorder ([place])    esophageal spasms at times   . Osteoarthritis   . Peripheral edema    take HCTZ daily  . Pneumonia    hx of.Many yrs ago  . 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 Seasonal allergies    takes Allegra daily and uses Flonase daily as needed  . Shortness of breath dyspnea    with exertion  . Venous stasis   . Yeast infection    given Diflucan today   LMP 11/03/1980 (Approximate)   Opioid Risk Score:   Fall Risk Score:  `1  Depression screen [facility] 2/9  Depression screen [facility] 2/9 12/14/2018 11/05/2018 09/13/2018 05/13/2018 04/08/2018 03/10/2018 11/23/2017  Decreased Interest 0 0 0 0 - 0 0  Down, Depressed, Hopeless 0 0 0 0 - 0 0  PHQ - 2 Score 0 0 0 0 - 0 0  Altered sleeping - - - - 0 - -  Tired, decreased energy - - - - - - -  Change in appetite - - - - - - -  Feeling bad or failure about yourself  - - - - - - -  Trouble concentrating - - - - - - -  Moving slowly or fidgety/restless - - - - - - -  Suicidal thoughts - - - - - - -  PHQ-9 Score - - - - - - -  Some recent data might be hidden     Review of Systems  Constitutional: Negative.   HENT: Negative.   Eyes: Negative.   Respiratory: Negative.   Cardiovascular: Negative.   Gastrointestinal: Positive for abdominal pain and constipation.  Endocrine: Negative.   Genitourinary: Negative.   Musculoskeletal: Positive for arthralgias, back pain and gait problem.  Skin: Negative.   Allergic/Immunologic: Negative.   Neurological: Positive for dizziness and weakness.  Hematological: Negative.   Psychiatric/Behavioral: Negative.   All other systems reviewed and are negative.      Objective:   Physical Exam Vitals and nursing note reviewed.  Constitutional:      Appearance: Normal appearance.  Cardiovascular:     Rate and Rhythm: Normal rate and regular rhythm.     Pulses: Normal pulses.     Heart sounds: Normal heart sounds.  Musculoskeletal:     Cervical back: Normal range of motion and neck supple.  Skin:    General: Skin is warm and dry.  Neurological:     Mental Status: She is alert and oriented to person, place, and time.  Psychiatric:        Mood and Affect:  Mood normal.        Behavior: Behavior normal.           Assessment & Plan:  1.Chronic low back pain withlumbar spondylosis: Continue withSlow Weaning:ContinueOxycodone. 5mg  one tablettwice a dayas needed #45.10/24/2019 We will continue the opioid monitoring program,this consists of regular clinic visits, examinations, urine drug screen, pill counts as well as use of [place] Controlled Substance reporting System. 2. Lumbar Radiculitis:No complaints today.S/PLumbar Radiofrequency with relief noted.Adverse Reaction with Gabapentin and Amitriptyline we willContinue to monitor.10/24/2019 3. Cervicalgia/Cervical Spondylosis/Cervical stenosis with radiculitis/ S/P Anterior Cervical Fusion C3-C4 with Dr. Ellene Route on 10/03/2014.3-level anterior decompression and arthrodesis per [facility] on hold.Continue to Monitor.Dr. Ellene Route Following. 10/24/2019 4. ChronicRightshoulder pain:No complaints today. Continue with Current medication regime.10/24/2019 S/P Left Reverse Total Shoulder Arthroplasty on 02/01/16 via Dr. Veverly Fells. 5. Chronic Right- sided Thoracic Back pain: Continue HEP as Tolerated. Continue to monitor.10/24/2019. 6. Muscle Spasm:No complaints today.Continueto monitor.10/24/2019. 7. Fatigue: No complaints today. Cardiology Following. Continue to Monitor.  F/U in 1 month  15 minutes of face to face patient care time was spent during this visit. All questions were encouraged and answered.

## 2019-10-25 ENCOUNTER — Other Ambulatory Visit: Payer: Medicare Other

## 2019-11-17 ENCOUNTER — Other Ambulatory Visit: Payer: Self-pay | Admitting: Cardiology

## 2019-12-06 ENCOUNTER — Ambulatory Visit: Payer: Medicare Other | Admitting: Registered Nurse

## 2019-12-07 ENCOUNTER — Ambulatory Visit: Payer: Medicare Other | Admitting: Registered Nurse

## 2019-12-08 ENCOUNTER — Encounter: Payer: Self-pay | Admitting: Registered Nurse

## 2019-12-08 ENCOUNTER — Encounter: Payer: Medicare Other | Attending: Physical Medicine and Rehabilitation | Admitting: Registered Nurse

## 2019-12-08 ENCOUNTER — Other Ambulatory Visit: Payer: Self-pay

## 2019-12-08 VITALS — BP 117/67 | HR 86 | Ht 62.5 in | Wt 129.0 lb

## 2019-12-08 DIAGNOSIS — G894 Chronic pain syndrome: Secondary | ICD-10-CM

## 2019-12-08 DIAGNOSIS — Z981 Arthrodesis status: Secondary | ICD-10-CM | POA: Diagnosis not present

## 2019-12-08 DIAGNOSIS — M47812 Spondylosis without myelopathy or radiculopathy, cervical region: Secondary | ICD-10-CM | POA: Insufficient documentation

## 2019-12-08 DIAGNOSIS — M47817 Spondylosis without myelopathy or radiculopathy, lumbosacral region: Secondary | ICD-10-CM

## 2019-12-08 DIAGNOSIS — M47816 Spondylosis without myelopathy or radiculopathy, lumbar region: Secondary | ICD-10-CM | POA: Insufficient documentation

## 2019-12-08 DIAGNOSIS — M12811 Other specific arthropathies, not elsewhere classified, right shoulder: Secondary | ICD-10-CM | POA: Insufficient documentation

## 2019-12-08 DIAGNOSIS — M546 Pain in thoracic spine: Secondary | ICD-10-CM | POA: Diagnosis not present

## 2019-12-08 DIAGNOSIS — Z79899 Other long term (current) drug therapy: Secondary | ICD-10-CM | POA: Insufficient documentation

## 2019-12-08 DIAGNOSIS — Z5181 Encounter for therapeutic drug level monitoring: Secondary | ICD-10-CM

## 2019-12-08 DIAGNOSIS — M542 Cervicalgia: Secondary | ICD-10-CM | POA: Diagnosis not present

## 2019-12-08 DIAGNOSIS — M12812 Other specific arthropathies, not elsewhere classified, left shoulder: Secondary | ICD-10-CM | POA: Insufficient documentation

## 2019-12-08 DIAGNOSIS — M4712 Other spondylosis with myelopathy, cervical region: Secondary | ICD-10-CM | POA: Diagnosis not present

## 2019-12-08 DIAGNOSIS — G8929 Other chronic pain: Secondary | ICD-10-CM

## 2019-12-08 DIAGNOSIS — Z79891 Long term (current) use of opiate analgesic: Secondary | ICD-10-CM

## 2019-12-08 MED ORDER — OXYCODONE HCL 5 MG PO TABS: 5.0000 mg | ORAL_TABLET | Freq: Two times a day (BID) | ORAL | 0 refills | Status: DC | PRN

## 2019-12-08 NOTE — Progress Notes (Signed)
Subjective:    Patient ID: Jenna [name], female    DOB: [birth year], 86 y.o.   MRN: [id number]  HPI: Jenna [name] is a 86 y.o. female whose appointment was changed to a virtual office visit to reduce the risk of exposure to the COVID-19 virus and to help Jenna [name] remain healthy and safe. The virtual visit will also provide continuity of care. Jenna [name] agrees with virtual visit and verbalizes understanding. She states her pain is located in her neck, upper-mid back mainly right side. She rates her pain 4. Her current exercise regime is walking and performing stretching exercises.  Jenna [name] Morphine equivalent is 14.67 MME.    Last Oral Swab was Performed on 09/08/2019, it was consistent.   Pain Inventory Average Pain 5 Pain Right Now 4 My pain is dull and aching  In the last 24 hours, has pain interfered with the following? General activity 4 Relation with others 4 Enjoyment of life 3 What TIME of day is your pain at its worst? evening Sleep (in general) Poor  Pain is worse with: unsure Pain improves with: medication Relief from Meds: 5  Mobility use a cane ability to climb steps?  yes do you drive?  no  Function retired  Neuro/Psych depression anxiety  Prior Studies Any changes since last visit?  no  Physicians involved in your care Any changes since last visit?  no   Family History  Problem Relation Age of Onset  . Stroke Mother   . Heart attack Father   . Diabetes Brother   . Heart disease Brother    Social History   Socioeconomic History  . Marital status: Married    Spouse name: Not on file  . Number of children: Not on file  . Years of education: Not on file  . Highest education level: Not on file  Occupational History  . Not on file  Tobacco Use  . Smoking status: Former Smoker    Packs/day: 1.00    Years: 15.00    Pack years: 15.00  . Smokeless tobacco: Never Used  . Tobacco comment: quit smoking in 1985  Substance and Sexual  Activity  . Alcohol use: No  . Drug use: No  . Sexual activity: Not Currently    Partners: Male    Birth control/protection: Surgical    Comment: hysterectomy  Other Topics Concern  . Not on file  Social History Narrative  . Not on file   Social Determinants of Health   Financial Resource Strain:   . Difficulty of Paying Living Expenses: Not on file  Food Insecurity:   . Worried About Charity fundraiser in the Last Year: Not on file  . Ran Out of Food in the Last Year: Not on file  Transportation Needs:   . Lack of Transportation (Medical): Not on file  . Lack of Transportation (Non-Medical): Not on file  Physical Activity:   . Days of Exercise per Week: Not on file  . Minutes of Exercise per Session: Not on file  Stress:   . Feeling of Stress : Not on file  Social Connections:   . Frequency of Communication with Friends and Family: Not on file  . Frequency of Social Gatherings with Friends and Family: Not on file  . Attends Religious Services: Not on file  . Active Member of Clubs or Organizations: Not on file  . Attends Archivist Meetings: Not on file  . Marital Status: Not on file  Past Surgical History:  Procedure Laterality Date  . ABDOMINAL HYSTERECTOMY  1982   BSO  . ANTERIOR CERVICAL DECOMP/DISCECTOMY FUSION N/A 10/03/2014   Procedure: ANTERIOR CERVICAL DECOMPRESSION/DISCECTOMY FUSION 1 LEVEL,CERVICAL THREE-FOUR;  Surgeon: [name], MD;  Location: [place] [facility];  Service: Neurosurgery;  Laterality: N/A;  . APPENDECTOMY  1986   along with chole  . BREAST BIOPSY Right 1997   sclerosing adenosis -Dr. Lucia Gaskins  . CARDIOVERSION N/A 04/29/2019   Procedure: CARDIOVERSION;  Surgeon: Lelon Perla, MD;  Location: [facility] ENDOSCOPY;  Service: Cardiovascular;  Laterality: N/A;  . CHOLECYSTECTOMY    . COLONOSCOPY    . ESOPHAGOGASTRODUODENOSCOPY     with dilitation  . LIPOMA EXCISION Left 10/1999   left thigh  . lumbosacral cyst  2005   seen on injection  for low back pain   . LUNG BIOPSY  05/2001   /w Dr.Burney- bronchoscopy, mediastinoscopy  . REVERSE SHOULDER ARTHROPLASTY Left 02/01/2016   Procedure: LEFT REVERSE TOTAL SHOULDER ARTHROPLASTY;  Surgeon: [name], MD;  Location: [place];  Service: Orthopedics;  Laterality: Left;  . rotator cuff  Bilateral 1996/1998   repair  . ROTATOR CUFF REPAIR Left 1996; 1981  . ROTATOR CUFF REPAIR Right 1996  . TONSILLECTOMY    . TOTAL HIP ARTHROPLASTY Left 10/2000  . TOTAL HIP ARTHROPLASTY Right 06/2005  . TOTAL HIP ARTHROPLASTY Left    10/2000  . VAGINAL DELIVERY     x1   Past Medical History:  Diagnosis Date  . Asthmatic bronchitis   . Atrial fibrillation ([place]) 2019  . Cataracts, bilateral    immature  . Chronic back pain    arthritis.Stenosis  . Dysphagia   . Dysrhythmia    (?or extra beats) treated /w atenolol  . Emphysema lung ([place])    left  . GERD (gastroesophageal reflux disease)    takes Omeprazole daily  . Hard of hearing    wears hearing aides   . History of blood transfusion    no abnormal reaction noted  . History of gastric ulcer   . Hypercholesteremia    takes Atorvastatin daily  . Hypertension    takes Coreg and Losartan daily  . Hypothyroidism    takes Synthroid daily  . IBS (irritable bowel syndrome)    takes Librax daily  . Joint pain   . Lumbosacral spondylosis without myelopathy   . Melanoma ([place])    on left leg, wide excision   . Muscle spasm    takes Robaxin daily as needed  . Neuromuscular disorder ([place])    esophageal spasms at times   . Osteoarthritis   . Peripheral edema    take HCTZ daily  . Pneumonia    hx of.Many yrs ago  . 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 Seasonal allergies    takes Allegra daily and uses Flonase daily as needed  . Shortness of breath dyspnea    with exertion  . Venous stasis   . Yeast infection    given Diflucan today   LMP 11/03/1980 (Approximate)   Opioid Risk  Score:   Fall Risk Score:  `1  Depression screen [facility] 2/9  Depression screen [facility] 2/9 12/14/2018 11/05/2018 09/13/2018 05/13/2018 04/08/2018 03/10/2018 11/23/2017  Decreased Interest 0 0 0 0 - 0 0  Down, Depressed, Hopeless 0 0 0 0 - 0 0  PHQ - 2 Score 0 0 0 0 - 0 0  Altered sleeping - - - -  0 - -  Tired, decreased energy - - - - - - -  Change in appetite - - - - - - -  Feeling bad or failure about yourself  - - - - - - -  Trouble concentrating - - - - - - -  Moving slowly or fidgety/restless - - - - - - -  Suicidal thoughts - - - - - - -  PHQ-9 Score - - - - - - -  Some recent data might be hidden     Review of Systems  Constitutional: Negative.   HENT: Negative.   Eyes: Negative.   Respiratory: Negative.   Cardiovascular: Negative.   Gastrointestinal: Positive for abdominal pain.  Endocrine: Negative.   Genitourinary: Negative.   Musculoskeletal: Positive for arthralgias, back pain and neck pain.  Skin: Negative.   Allergic/Immunologic: Negative.   Neurological: Negative.   Hematological: Negative.   Psychiatric/Behavioral: Negative.        Objective:   Physical Exam Vitals and nursing note reviewed.  Musculoskeletal:     Comments: No Physical Exam: Virtual visit           Assessment & Plan:  1.Chronic low back pain withlumbar spondylosis: Continue withSlow Weaning:ContinueOxycodone. 5mg  one tablettwice a dayas needed #45.12/08/2019 We will continue the opioid monitoring program,this consists of regular clinic visits, examinations, urine drug screen, pill counts as well as use of [place] Controlled Substance reporting System. 2. Lumbar Radiculitis:No complaints today.S/PLumbar Radiofrequency with relief noted.Adverse Reaction with Gabapentin and Amitriptyline we willContinue to monitor.12/08/2019. 3. Cervicalgia/Cervical Spondylosis/Cervical stenosis with radiculitis/ S/P Anterior Cervical Fusion C3-C4 with Dr. Ellene Route on 10/03/2014.3-level  anterior decompression and arthrodesis per [facility] on hold.Continue to Monitor.Dr. Ellene Route Following.12/08/2019 4. ChronicRightshoulder pain:No complaints today. Continue with Current medication regime.12/08/2019 S/P Left Reverse Total Shoulder Arthroplasty on 02/01/16 via Dr. Veverly Fells. 5. Chronic Right- sided Thoracic Back pain: Continue HEP as Tolerated. Continue to monitor.12/08/2019. 6. Muscle Spasm:No complaints today.Continueto monitor.12/08/2019. 7. Fatigue:No complaints today.Cardiology Following. Continue to Monitor. 12/08/2019  F/U in 1 month  Tele-Health Visit Telephone Call Established Patient Location of Patient: In her Home Location of Provider: In office Total Time Spent: 10 Minutes

## 2019-12-22 ENCOUNTER — Other Ambulatory Visit: Payer: Self-pay | Admitting: Gastroenterology

## 2019-12-22 DIAGNOSIS — R932 Abnormal findings on diagnostic imaging of liver and biliary tract: Secondary | ICD-10-CM

## 2019-12-22 DIAGNOSIS — R1011 Right upper quadrant pain: Secondary | ICD-10-CM

## 2019-12-27 ENCOUNTER — Ambulatory Visit
Admission: RE | Admit: 2019-12-27 | Discharge: 2019-12-27 | Disposition: A | Discharge status: Home / Self Care | Payer: Medicare Other | Source: Ambulatory Visit | Attending: Gastroenterology | Admitting: Gastroenterology

## 2019-12-27 ENCOUNTER — Other Ambulatory Visit: Payer: Self-pay

## 2019-12-27 DIAGNOSIS — R1011 Right upper quadrant pain: Secondary | ICD-10-CM

## 2019-12-27 DIAGNOSIS — R932 Abnormal findings on diagnostic imaging of liver and biliary tract: Secondary | ICD-10-CM

## 2019-12-27 MED ORDER — GADOBENATE DIMEGLUMINE 529 MG/ML IV SOLN
12.0000 mL | Freq: Once | INTRAVENOUS | Status: AC | PRN
  Administered 2019-12-27: 12 mL via INTRAVENOUS

## 2020-01-10 ENCOUNTER — Ambulatory Visit: Payer: Medicare Other | Admitting: Certified Nurse Midwife

## 2020-01-12 ENCOUNTER — Ambulatory Visit: Payer: Medicare Other | Admitting: Registered Nurse

## 2020-01-17 ENCOUNTER — Encounter: Payer: Medicare Other | Admitting: Registered Nurse

## 2020-01-25 ENCOUNTER — Other Ambulatory Visit: Payer: Self-pay

## 2020-01-25 ENCOUNTER — Encounter: Payer: Self-pay | Admitting: Certified Nurse Midwife

## 2020-01-25 ENCOUNTER — Ambulatory Visit (INDEPENDENT_AMBULATORY_CARE_PROVIDER_SITE_OTHER): Payer: Medicare Other | Admitting: Certified Nurse Midwife

## 2020-01-25 VITALS — BP 120/70 | HR 72 | Temp 97.0°F | Ht 61.25 in | Wt 124.0 lb

## 2020-01-25 DIAGNOSIS — Z780 Asymptomatic menopausal state: Secondary | ICD-10-CM

## 2020-01-25 DIAGNOSIS — Z01419 Encounter for gynecological examination (general) (routine) without abnormal findings: Secondary | ICD-10-CM | POA: Diagnosis not present

## 2020-01-25 NOTE — Progress Notes (Signed)
86 y.o. [id number] Married  Caucasian Fe here for annual exam. Denies vaginal bleeding or vaginal bleeding. Social stress with spouse with cancer again. Patient recently by her GI for gastric distress. MRI and rectal exams done, all normal now. Declines rectal exam today. PCP visits this year with labs and exam. All normal at this point. Still driving with no issues. No falls. No other health issues today.  Patient's last menstrual period was 11/03/1980 (approximate).          Sexually active: No.  The current method of family planning is status post hysterectomy.    Exercising: No.  exercise Smoker:  no  Review of Systems  Constitutional: Negative.   HENT: Negative.   Eyes: Negative.   Respiratory: Negative.   Cardiovascular: Negative.   Gastrointestinal: Positive for diarrhea.  Genitourinary: Negative.   Musculoskeletal: Negative.   Skin: Negative.   Neurological: Negative.   Endo/Heme/Allergies: Negative.   Psychiatric/Behavioral: Negative.     Health Maintenance: Pap:  2002 neg History of Abnormal Pap: no MMG:  07-27-18 category b density birads 1:neg Self Breast exams: yes Colonoscopy:  2004, inability to swallow prep BMD:   2018 TDaP:  2013 Shingles: 2016 Pneumonia: had done Hep C and HIV: not done Labs: if needed, PCP does   reports that she has quit smoking. She has a 15.00 pack-year smoking history. She has never used smokeless tobacco. She reports that she does not drink alcohol or use drugs.  Past Medical History:  Diagnosis Date  . Asthmatic bronchitis   . Atrial fibrillation ([place]) 2019  . Cataracts, bilateral    immature  . Chronic back pain    arthritis.Stenosis  . Dysphagia   . Dysrhythmia    (?or extra beats) treated /w atenolol  . Emphysema lung ([place])    left  . GERD (gastroesophageal reflux disease)    takes Omeprazole daily  . Hard of hearing    wears hearing aides   . History of blood transfusion    no abnormal reaction noted  . History of  gastric ulcer   . Hypercholesteremia    takes Atorvastatin daily  . Hypertension    takes Coreg and Losartan daily  . Hypothyroidism    takes Synthroid daily  . IBS (irritable bowel syndrome)    takes Librax daily  . Joint pain   . Lumbosacral spondylosis without myelopathy   . Melanoma ([place])    on left leg, wide excision   . Muscle spasm    takes Robaxin daily as needed  . Neuromuscular disorder ([place])    esophageal spasms at times   . Osteoarthritis   . Peripheral edema    take HCTZ daily  . Pneumonia    hx of.Many yrs ago  . 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 Seasonal allergies    takes Allegra daily and uses Flonase daily as needed  . Shortness of breath dyspnea    with exertion  . Venous stasis   . Yeast infection    given Diflucan today    Past Surgical History:  Procedure Laterality Date  . ABDOMINAL HYSTERECTOMY  1982   BSO  . ANTERIOR CERVICAL DECOMP/DISCECTOMY FUSION N/A 10/03/2014   Procedure: ANTERIOR CERVICAL DECOMPRESSION/DISCECTOMY FUSION 1 LEVEL,CERVICAL THREE-FOUR;  Surgeon: [name], MD;  Location: [place] [facility];  Service: Neurosurgery;  Laterality: N/A;  . APPENDECTOMY  1986   along with chole  . BREAST BIOPSY Right 1997  sclerosing adenosis -Dr. Lucia Gaskins  . CARDIOVERSION N/A 04/29/2019   Procedure: CARDIOVERSION;  Surgeon: Lelon Perla, MD;  Location: [facility] ENDOSCOPY;  Service: Cardiovascular;  Laterality: N/A;  . CHOLECYSTECTOMY    . COLONOSCOPY    . ESOPHAGOGASTRODUODENOSCOPY     with dilitation  . LIPOMA EXCISION Left 10/1999   left thigh  . lumbosacral cyst  2005   seen on injection for low back pain   . LUNG BIOPSY  05/2001   /w Dr.Burney- bronchoscopy, mediastinoscopy  . REVERSE SHOULDER ARTHROPLASTY Left 02/01/2016   Procedure: LEFT REVERSE TOTAL SHOULDER ARTHROPLASTY;  Surgeon: [name], MD;  Location: [place];  Service: Orthopedics;  Laterality: Left;  . rotator cuff  Bilateral  1996/1998   repair  . ROTATOR CUFF REPAIR Left 1996; 1981  . ROTATOR CUFF REPAIR Right 1996  . TONSILLECTOMY    . TOTAL HIP ARTHROPLASTY Left 10/2000  . TOTAL HIP ARTHROPLASTY Right 06/2005  . TOTAL HIP ARTHROPLASTY Left    10/2000  . VAGINAL DELIVERY     x1    Current Outpatient Medications  Medication Sig Dispense Refill  . albuterol (PROVENTIL HFA;VENTOLIN HFA) 108 (90 Base) MCG/ACT inhaler Inhale 2 puffs into the lungs every 6 (six) hours as needed for wheezing or shortness of breath.    [name] atorvastatin (LIPITOR) 20 MG tablet Take 20 mg by mouth daily.    . Calcium Carbonate-Vitamin D (CALCIUM 600+D PO) Take 1 tablet by mouth daily.    . carvedilol (COREG) 6.25 MG tablet Take 1 tablet (6.25 mg total) by mouth 2 (two) times daily with a meal. 180 tablet 0  . cholecalciferol (VITAMIN D) 1000 UNITS tablet Take 1,000 Units by mouth daily.    . clidinium-chlordiazePOXIDE (LIBRAX) 5-2.5 MG capsule chlordiazepoxide-clidinium 5 mg-2.5 mg capsule  TAKE 1 CAPSULE BY MOUTH EVERY DAY AS DIRECTED    . diazepam (VALIUM) 5 MG tablet Take 2.5 mg by mouth at bedtime as needed for sedation.     . Eflornithine HCl (VANIQA) 13.9 % cream Vaniqa 13.9 % topical cream  APPLY TO AFFECTED AREA TWICE A DAY AS DIRECTED    . ELIQUIS 2.5 MG TABS tablet TAKE 1 TABLET BY MOUTH 2 TIMES DAILY. 180 tablet 2  . fluticasone (FLONASE) 50 MCG/ACT nasal spray Place 2 sprays into both nostrils daily.     . furosemide (LASIX) 20 MG tablet Take 20 mg by mouth daily.     . hyoscyamine (LEVSIN SL) 0.125 MG SL tablet Place 0.125 mg under the tongue every 4 (four) hours as needed for cramping.     [name] levothyroxine (SYNTHROID, LEVOTHROID) 112 MCG tablet Take 112 mcg by mouth daily before breakfast.    . losartan (COZAAR) 100 MG tablet Take 1 tablet (100 mg total) by mouth daily. 30 tablet 0  . naphazoline-pheniramine (NAPHCON-A) 0.025-0.3 % ophthalmic solution Place 1 drop into both eyes 4 (four) times daily as needed for eye  irritation.    [name] omeprazole (PRILOSEC) 40 MG capsule Take 40 mg by mouth at bedtime.     [name] oxyCODONE (OXY IR/ROXICODONE) 5 MG immediate release tablet Take 1 tablet (5 mg total) by mouth 2 (two) times daily as needed for moderate pain. 45 tablet 0  . triamcinolone cream (KENALOG) 0.1 % USE AS DIRECTED     No current facility-administered medications for this visit.    Family History  Problem Relation Age of Onset  . Stroke Mother   . Heart attack Father   . Diabetes Brother   .  Heart disease Brother     ROS:  Pertinent items are noted in HPI.  Otherwise, a comprehensive ROS was negative.  Exam:   LMP 11/03/1980 (Approximate)    Ht Readings from Last 3 Encounters:  12/08/19 5' 2.5" (1.588 m)  10/24/19 5\' 2"  (1.575 m)  10/20/19 5' 2.5" (1.588 m)    General appearance: alert, cooperative and appears stated age Head: Normocephalic, without obvious abnormality, atraumatic Neck: no adenopathy, supple, symmetrical, trachea midline and thyroid normal to inspection and palpation Lungs: clear to auscultation bilaterally Breasts: normal appearance, no masses or tenderness, No nipple retraction or dimpling, No nipple discharge or bleeding, No axillary or supraclavicular adenopathy Heart: regular rate and rhythm Abdomen: soft, non-tender; no masses,  no organomegaly Extremities: extremities normal, atraumatic, no cyanosis or edema Skin: Skin color, texture, turgor normal. No rashes or lesions Lymph nodes: Cervical, supraclavicular, and axillary nodes normal. No abnormal inguinal nodes palpated Neurologic: Grossly normal   Pelvic: External genitalia:  no lesions              Urethra:  normal appearing urethra with no masses, tenderness or lesions              Bartholin's and Skene's: normal                 Vagina: normal appearing vagina with normal color and discharge, no lesions              Cervix: absent              Pap taken: No. Bimanual Exam:  Uterus:  uterus absent               Adnexa: no mass, fullness, tenderness and adnexa surgically absent               Rectovaginal: Confirms               Anus:  normal sphincter tone, no lesions  Chaperone present: yes  A:  Well Woman with normal exam  Post menopausal no HRT s/p TAH with BSO  Cholesterol, Vitamin D, Eliquis management with MD  Social stress with spouse cancer     P:   Reviewed health and wellness pertinent to exam  Continue follow up with MD as indicated  Encouraged to take time for self .  Pap smear: no   counseled on breast self exam, mammography screening, feminine hygiene, adequate intake of calcium and vitamin D, diet and exercise, Kegel's exercises  return annually or prn  An After Visit Summary was printed and given to the patient.

## 2020-01-25 NOTE — Patient Instructions (Signed)

## 2020-02-01 ENCOUNTER — Encounter: Payer: Medicare Other | Admitting: Registered Nurse

## 2020-02-20 ENCOUNTER — Other Ambulatory Visit: Payer: Self-pay

## 2020-02-20 ENCOUNTER — Encounter: Payer: Medicare Other | Attending: Physical Medicine and Rehabilitation | Admitting: Registered Nurse

## 2020-02-20 ENCOUNTER — Encounter: Payer: Self-pay | Admitting: Registered Nurse

## 2020-02-20 VITALS — BP 161/94 | HR 83 | Temp 97.7°F | Ht 62.5 in | Wt 122.0 lb

## 2020-02-20 DIAGNOSIS — M12811 Other specific arthropathies, not elsewhere classified, right shoulder: Secondary | ICD-10-CM | POA: Diagnosis present

## 2020-02-20 DIAGNOSIS — M5412 Radiculopathy, cervical region: Secondary | ICD-10-CM

## 2020-02-20 DIAGNOSIS — Z981 Arthrodesis status: Secondary | ICD-10-CM

## 2020-02-20 DIAGNOSIS — Z5181 Encounter for therapeutic drug level monitoring: Secondary | ICD-10-CM

## 2020-02-20 DIAGNOSIS — G894 Chronic pain syndrome: Secondary | ICD-10-CM

## 2020-02-20 DIAGNOSIS — M542 Cervicalgia: Secondary | ICD-10-CM

## 2020-02-20 DIAGNOSIS — M12812 Other specific arthropathies, not elsewhere classified, left shoulder: Secondary | ICD-10-CM | POA: Insufficient documentation

## 2020-02-20 DIAGNOSIS — M5416 Radiculopathy, lumbar region: Secondary | ICD-10-CM

## 2020-02-20 DIAGNOSIS — M4712 Other spondylosis with myelopathy, cervical region: Secondary | ICD-10-CM | POA: Diagnosis not present

## 2020-02-20 DIAGNOSIS — M47816 Spondylosis without myelopathy or radiculopathy, lumbar region: Secondary | ICD-10-CM | POA: Diagnosis present

## 2020-02-20 DIAGNOSIS — Z79899 Other long term (current) drug therapy: Secondary | ICD-10-CM | POA: Insufficient documentation

## 2020-02-20 DIAGNOSIS — M47812 Spondylosis without myelopathy or radiculopathy, cervical region: Secondary | ICD-10-CM | POA: Insufficient documentation

## 2020-02-20 DIAGNOSIS — Z79891 Long term (current) use of opiate analgesic: Secondary | ICD-10-CM

## 2020-02-20 MED ORDER — OXYCODONE HCL 5 MG PO TABS: 5.0000 mg | ORAL_TABLET | Freq: Two times a day (BID) | ORAL | 0 refills | Status: DC | PRN

## 2020-02-20 NOTE — Progress Notes (Signed)
Subjective:    Patient ID: Jenna [name], female    DOB: 1933-05-04, 86 y.o.   MRN: [id number]  HPI: Jenna [name] is a 86 y.o. female who returns for follow up appointment for chronic pain and medication refill. She states her pain is located in her neck radiating into her right shoulder and  lower back pain radiating into her right lower extremity. She rates her pain 6. Her current exercise regime is walking.  Jenna [name] states her husband is on [place] his cancer has metastasized, emotional support given.   Jenna [name] Morphine equivalent is 14.67 MME.  She  is also prescribed Diazepam by Dr. Mancel Bale. We have discussed the black box warning of using opioids and benzodiazepines. I highlighted the dangers of using these drugs together and discussed the adverse events including respiratory suppression, overdose, cognitive impairment and importance of compliance with current regimen. We will continue to monitor and adjust as indicated.    Oral Swab was Performed Today.    Pain Inventory Average Pain 6 Pain Right Now 6 My pain is intermittent, sharp and tingling  In the last 24 hours, has pain interfered with the following? General activity 6 Relation with others 4 Enjoyment of life 5 What TIME of day is your pain at its worst? evening Sleep (in general) Fair  Pain is worse with: walking, standing and some activites Pain improves with: rest and medication Relief from Meds: 6  Mobility walk with assistance use a cane ability to climb steps?  yes do you drive?  yes Do you have any goals in this area?  yes  Function retired  Neuro/Psych weakness trouble walking  Prior Studies Any changes since last visit?  no  Physicians involved in your care Any changes since last visit?  no   Family History  Problem Relation Age of Onset  . Stroke Mother   . Heart attack Father   . Diabetes Brother   . Heart disease Brother    Social History   Socioeconomic History  .  Marital status: Married    Spouse name: Not on file  . Number of children: Not on file  . Years of education: Not on file  . Highest education level: Not on file  Occupational History  . Not on file  Tobacco Use  . Smoking status: Former Smoker    Packs/day: 1.00    Years: 15.00    Pack years: 15.00  . Smokeless tobacco: Never Used  . Tobacco comment: quit smoking in 1985  Substance and Sexual Activity  . Alcohol use: No  . Drug use: No  . Sexual activity: Not Currently    Partners: Male    Birth control/protection: Surgical    Comment: hysterectomy  Other Topics Concern  . Not on file  Social History Narrative  . Not on file   Social Determinants of Health   Financial Resource Strain:   . Difficulty of Paying Living Expenses:   Food Insecurity:   . Worried About Charity fundraiser in the Last Year:   . Arboriculturist in the Last Year:   Transportation Needs:   . Film/video editor (Medical):   [name] Lack of Transportation (Non-Medical):   Physical Activity:   . Days of Exercise per Week:   . Minutes of Exercise per Session:   Stress:   . Feeling of Stress :   Social Connections:   . Frequency of Communication with Friends and Family:   .  Frequency of Social Gatherings with Friends and Family:   . Attends Religious Services:   . Active Member of Clubs or Organizations:   . Attends Archivist Meetings:   [name] Marital Status:    Past Surgical History:  Procedure Laterality Date  . ABDOMINAL HYSTERECTOMY  1982   BSO  . ANTERIOR CERVICAL DECOMP/DISCECTOMY FUSION N/A 10/03/2014   Procedure: ANTERIOR CERVICAL DECOMPRESSION/DISCECTOMY FUSION 1 LEVEL,CERVICAL THREE-FOUR;  Surgeon: [name], MD;  Location: [place] [facility];  Service: Neurosurgery;  Laterality: N/A;  . APPENDECTOMY  1986   along with chole  . BREAST BIOPSY Right 1997   sclerosing adenosis -Dr. Lucia Gaskins  . CARDIOVERSION N/A 04/29/2019   Procedure: CARDIOVERSION;  Surgeon: Lelon Perla, MD;   [facility] ENDOSCOPY;  Service: Cardiovascular;  Laterality: N/A;  . CHOLECYSTECTOMY    . COLONOSCOPY    . ESOPHAGOGASTRODUODENOSCOPY     with dilitation  . LIPOMA EXCISION Left 10/1999   left thigh  . lumbosacral cyst  2005   seen on injection for low back pain   . LUNG BIOPSY  05/2001   /w Dr.Burney- bronchoscopy, mediastinoscopy  . REVERSE SHOULDER ARTHROPLASTY Left 02/01/2016   Procedure: LEFT REVERSE TOTAL SHOULDER ARTHROPLASTY;  Surgeon: [name], MD;  Location: [place];  Service: Orthopedics;  Laterality: Left;  . rotator cuff  Bilateral 1996/1998   repair  . ROTATOR CUFF REPAIR Left 1996; 1981  . ROTATOR CUFF REPAIR Right 1996  . TONSILLECTOMY    . TOTAL HIP ARTHROPLASTY Left 10/2000  . TOTAL HIP ARTHROPLASTY Right 06/2005  . TOTAL HIP ARTHROPLASTY Left    10/2000  . VAGINAL DELIVERY     x1   Past Medical History:  Diagnosis Date  . Asthmatic bronchitis   . Atrial fibrillation ([place]) 2019  . Cataracts, bilateral    immature  . Chronic back pain    arthritis.Stenosis  . Dysphagia   . Dysrhythmia    (?or extra beats) treated /w atenolol  . Emphysema lung ([place])    left  . GERD (gastroesophageal reflux disease)    takes Omeprazole daily  . Hard of hearing    wears hearing aides   . History of blood transfusion    no abnormal reaction noted  . History of gastric ulcer   . Hypercholesteremia    takes Atorvastatin daily  . Hypertension    takes Coreg and Losartan daily  . Hypothyroidism    takes Synthroid daily  . IBS (irritable bowel syndrome)    takes Librax daily  . Joint pain   . Lumbosacral spondylosis without myelopathy   . Melanoma ([place])    on left leg, wide excision   . Muscle spasm    takes Robaxin daily as needed  . Neuromuscular disorder ([place])    esophageal spasms at times   . Osteoarthritis   . Peripheral edema    take HCTZ daily  . Pneumonia    hx of.Many yrs ago  . 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 Seasonal allergies    takes Allegra daily and uses Flonase daily as needed  . Shortness of breath dyspnea    with exertion  . Venous stasis   . Yeast infection    given Diflucan today   BP (!) 165/71   Pulse 83   Temp 97.7 F (36.5 C)   Ht 5' 2.5" (1.588 m)   Wt 122 lb (55.3 kg)   LMP 11/03/1980 (  Approximate)   SpO2 95%   BMI 21.96 kg/m   Opioid Risk Score:   Fall Risk Score:  `1  Depression screen PHQ 2/9  Depression screen [facility] 2/9 12/14/2018 11/05/2018 09/13/2018 05/13/2018 04/08/2018 03/10/2018 11/23/2017  Decreased Interest 0 0 0 0 - 0 0  Down, Depressed, Hopeless 0 0 0 0 - 0 0  PHQ - 2 Score 0 0 0 0 - 0 0  Altered sleeping - - - - 0 - -  Tired, decreased energy - - - - - - -  Change in appetite - - - - - - -  Feeling bad or failure about yourself  - - - - - - -  Trouble concentrating - - - - - - -  Moving slowly or fidgety/restless - - - - - - -  Suicidal thoughts - - - - - - -  PHQ-9 Score - - - - - - -  Some recent data might be hidden    Review of Systems  Constitutional: Negative.   HENT: Negative.   Eyes: Negative.   Respiratory: Negative.   Cardiovascular: Negative.   Gastrointestinal: Negative.   Endocrine: Negative.   Genitourinary: Negative.   Musculoskeletal: Positive for arthralgias, back pain and gait problem.  Skin: Negative.   Neurological: Positive for weakness.  Hematological: Negative.   Psychiatric/Behavioral: Negative.   All other systems reviewed and are negative.      Objective:   Physical Exam Vitals and nursing note reviewed.  Constitutional:      Appearance: Normal appearance.  Cardiovascular:     Rate and Rhythm: Normal rate and regular rhythm.     Pulses: Normal pulses.     Heart sounds: Normal heart sounds.  Pulmonary:     Effort: Pulmonary effort is normal.     Breath sounds: Normal breath sounds.  Musculoskeletal:     Cervical back: Normal range of motion and neck supple.     Comments: Normal Muscle Bulk  and Muscle Testing Reveals:  Upper Extremities: Full ROM and Muscle Strength 5/5 Right AC Joint Tenderness Lumbar Paraspinal Tenderness: L-4-L-5 Mainly Right Side Lower Extremities: Full ROM and Muscle Strength 5/5 Arises from Table with ease Narrow Based Gait   Skin:    General: Skin is warm and dry.  Neurological:     Mental Status: She is alert and oriented to person, place, and time.  Psychiatric:        Mood and Affect: Mood normal.        Behavior: Behavior normal.           Assessment & Plan:  1.Chronic low back pain withlumbar spondylosis: Continue withSlow Weaning:ContinueOxycodone. 5mg  one tablettwice a dayas needed #45.02/20/2020 We will continue the opioid monitoring program,this consists of regular clinic visits, examinations, urine drug screen, pill counts as well as use of [place] Controlled Substance reporting System. 2. Lumbar Radiculitis:S/PLumbar Radiofrequency with relief noted.Adverse Reaction with Gabapentin and Amitriptyline we willContinue to monitor.02/20/2020. 3. Cervicalgia/Cervical Spondylosis/Cervical stenosis with radiculitis/ S/P Anterior Cervical Fusion C3-C4 with Dr. Ellene Route on 10/03/2014.3-level anterior decompression and arthrodesis per [facility] on hold.Continue to Monitor.Dr. Ellene Route Following.02/20/2020 4. ChronicRightshoulder pain:No complaints today.Continue with Current medication regime.04/019/2021 S/P Left Reverse Total Shoulder Arthroplasty on 02/01/16 via Dr. Veverly Fells. 5. Chronic Right- sided Thoracic Back pain: Continue HEP as Tolerated. Continue to monitor.12/08/2019. 6. Muscle Spasm:No complaints today.Continueto monitor.12/08/2019. 7. Fatigue:No complaints today.Cardiology Following. Continue to Monitor. 02/20/2020  F/U in 2 months  15 minutes of face to  face patient care time was spent during this visit. All questions were encouraged and answered.

## 2020-02-25 LAB — DRUG TOX MONITOR 1 W/CONF, ORAL FLD
Alprazolam: NEGATIVE ng/mL (ref <0.50)
Amphetamines: NEGATIVE ng/mL (ref <10)
Barbiturates: NEGATIVE ng/mL (ref <10)
Benzodiazepines: POSITIVE ng/mL — AB (ref <0.50)
Buprenorphine: NEGATIVE ng/mL (ref <0.10)
Chlordiazepoxide: NEGATIVE ng/mL (ref <0.50)
Clonazepam: NEGATIVE ng/mL (ref <0.50)
Cocaine: NEGATIVE ng/mL (ref <5.0)
Codeine: NEGATIVE ng/mL (ref <2.5)
Diazepam: NEGATIVE ng/mL (ref <0.50)
Dihydrocodeine: NEGATIVE ng/mL (ref <2.5)
Fentanyl: NEGATIVE ng/mL (ref <0.10)
Flunitrazepam: NEGATIVE ng/mL (ref <0.50)
Flurazepam: NEGATIVE ng/mL (ref <0.50)
Heroin Metabolite: NEGATIVE ng/mL (ref <1.0)
Hydrocodone: NEGATIVE ng/mL (ref <2.5)
Hydromorphone: NEGATIVE ng/mL (ref <2.5)
Lorazepam: NEGATIVE ng/mL (ref <0.50)
MARIJUANA: NEGATIVE ng/mL (ref <2.5)
MDMA: NEGATIVE ng/mL (ref <10)
Meprobamate: NEGATIVE ng/mL (ref <2.5)
Methadone: NEGATIVE ng/mL (ref <5.0)
Midazolam: NEGATIVE ng/mL (ref <0.50)
Morphine: NEGATIVE ng/mL (ref <2.5)
Nicotine Metabolite: NEGATIVE ng/mL (ref <5.0)
Nordiazepam: 1.9 ng/mL — ABNORMAL HIGH (ref <0.50)
Norhydrocodone: NEGATIVE ng/mL (ref <2.5)
Noroxycodone: 17.4 ng/mL — ABNORMAL HIGH (ref <2.5)
Opiates: POSITIVE ng/mL — AB (ref <2.5)
Oxazepam: NEGATIVE ng/mL (ref <0.50)
Oxycodone: 38 ng/mL — ABNORMAL HIGH (ref <2.5)
Oxymorphone: NEGATIVE ng/mL (ref <2.5)
Phencyclidine: NEGATIVE ng/mL (ref <10)
Tapentadol: NEGATIVE ng/mL (ref <5.0)
Temazepam: NEGATIVE ng/mL (ref <0.50)
Tramadol: NEGATIVE ng/mL (ref <5.0)
Triazolam: NEGATIVE ng/mL (ref <0.50)
Zolpidem: NEGATIVE ng/mL (ref <5.0)

## 2020-02-25 LAB — DRUG TOX ALC METAB W/CON, ORAL FLD: Alcohol Metabolite: NEGATIVE ng/mL (ref <25)

## 2020-02-27 ENCOUNTER — Telehealth: Payer: Self-pay | Admitting: *Deleted

## 2020-02-27 NOTE — Telephone Encounter (Signed)
Oral swab drug screen was consistent for prescribed medications.  ?

## 2020-03-22 ENCOUNTER — Ambulatory Visit: Payer: Medicare Other | Admitting: Physician Assistant

## 2020-03-30 ENCOUNTER — Telehealth: Payer: Self-pay | Admitting: Cardiology

## 2020-03-30 NOTE — Telephone Encounter (Signed)
I called patient 03/30/20 to schedule follow up visit from Dr.Jordan's recall list. The patient didn't answer so I left message for patient to return call to get that scheduled

## 2020-04-24 ENCOUNTER — Encounter: Payer: Self-pay | Admitting: Registered Nurse

## 2020-04-24 ENCOUNTER — Other Ambulatory Visit: Payer: Self-pay

## 2020-04-24 ENCOUNTER — Encounter: Payer: Medicare Other | Attending: Physical Medicine and Rehabilitation | Admitting: Registered Nurse

## 2020-04-24 VITALS — BP 156/88 | HR 88 | Temp 97.7°F | Ht 62.5 in | Wt 117.0 lb

## 2020-04-24 DIAGNOSIS — Z981 Arthrodesis status: Secondary | ICD-10-CM

## 2020-04-24 DIAGNOSIS — Z5181 Encounter for therapeutic drug level monitoring: Secondary | ICD-10-CM | POA: Insufficient documentation

## 2020-04-24 DIAGNOSIS — M546 Pain in thoracic spine: Secondary | ICD-10-CM

## 2020-04-24 DIAGNOSIS — Z79891 Long term (current) use of opiate analgesic: Secondary | ICD-10-CM | POA: Diagnosis present

## 2020-04-24 DIAGNOSIS — Z79899 Other long term (current) drug therapy: Secondary | ICD-10-CM | POA: Insufficient documentation

## 2020-04-24 DIAGNOSIS — M47816 Spondylosis without myelopathy or radiculopathy, lumbar region: Secondary | ICD-10-CM | POA: Diagnosis present

## 2020-04-24 DIAGNOSIS — M12812 Other specific arthropathies, not elsewhere classified, left shoulder: Secondary | ICD-10-CM | POA: Insufficient documentation

## 2020-04-24 DIAGNOSIS — M25511 Pain in right shoulder: Secondary | ICD-10-CM

## 2020-04-24 DIAGNOSIS — M542 Cervicalgia: Secondary | ICD-10-CM | POA: Diagnosis not present

## 2020-04-24 DIAGNOSIS — M5416 Radiculopathy, lumbar region: Secondary | ICD-10-CM | POA: Diagnosis not present

## 2020-04-24 DIAGNOSIS — G8929 Other chronic pain: Secondary | ICD-10-CM

## 2020-04-24 DIAGNOSIS — M47812 Spondylosis without myelopathy or radiculopathy, cervical region: Secondary | ICD-10-CM | POA: Insufficient documentation

## 2020-04-24 DIAGNOSIS — M12811 Other specific arthropathies, not elsewhere classified, right shoulder: Secondary | ICD-10-CM | POA: Diagnosis present

## 2020-04-24 DIAGNOSIS — M4712 Other spondylosis with myelopathy, cervical region: Secondary | ICD-10-CM | POA: Diagnosis not present

## 2020-04-24 DIAGNOSIS — G894 Chronic pain syndrome: Secondary | ICD-10-CM | POA: Diagnosis present

## 2020-04-24 MED ORDER — OXYCODONE HCL 5 MG PO TABS: 5.0000 mg | ORAL_TABLET | Freq: Two times a day (BID) | ORAL | 0 refills | Status: DC | PRN

## 2020-04-24 NOTE — Progress Notes (Signed)
Subjective:    Patient ID: Jenna [name], female    DOB: 1933/08/11, 86 y.o.   MRN: [id number]  HPI: Jenna [name] is a 86 y.o. female who returns for follow up appointment for chronic pain and medication refill. She states her pain is located in her right shoulder, middle back mainly right side and lower back pain radiating into her right lower extremity. She rates her pain 4. Her current exercise regime is walking and performing stretching exercises.  Ms. Fellman reports her husband passed away on 03-16-20, emotional support given.   Ms. Castner Morphine equivalent is 14.67 MME. She  is also prescribed Diazepam by Dr. Mancel Bale. We have discussed the black box warning of using opioids and benzodiazepines. I highlighted the dangers of using these drugs together and discussed the adverse events including respiratory suppression, overdose, cognitive impairment and importance of compliance with current regimen. We will continue to monitor and adjust as indicated.    Last Oral Swab was Performed on 02/20/2020, it was consistent.    Pain Inventory Average Pain 6 Pain Right Now 4 My pain is sharp, stabbing and aching  In the last 24 hours, has pain interfered with the following? General activity 6 Relation with others 5 Enjoyment of life 6 What TIME of day is your pain at its worst? daytime, evening Sleep (in general) Fair  Pain is worse with: sitting, standing and some activites Pain improves with: rest, pacing activities and medication Relief from Meds: 5  Mobility walk without assistance walk with assistance use a cane how many minutes can you walk? 10 ability to climb steps?  yes do you drive?  yes  Function retired  Neuro/Psych weakness trouble walking  Prior Studies Any changes since last visit?  no  Physicians involved in your care Any changes since last visit?  no   Family History  Problem Relation Age of Onset   Stroke Mother    Heart attack Father      Diabetes Brother    Heart disease Brother    Social History   Socioeconomic History   Marital status: Married    Spouse name: Not on file   Number of children: Not on file   Years of education: Not on file   Highest education level: Not on file  Occupational History   Not on file  Tobacco Use   Smoking status: Former Smoker    Packs/day: 1.00    Years: 15.00    Pack years: 15.00   Smokeless tobacco: Never Used   Tobacco comment: quit smoking in 1985  Vaping Use   Vaping Use: Never used  Substance and Sexual Activity   Alcohol use: No   Drug use: No   Sexual activity: Not Currently    Partners: Male    Birth control/protection: Surgical    Comment: hysterectomy  Other Topics Concern   Not on file  Social History Narrative   Not on file   Social Determinants of Health   Financial Resource Strain:    Difficulty of Paying Living Expenses:   Food Insecurity:    Worried About Charity fundraiser in the Last Year:    Arboriculturist in the Last Year:   Transportation Needs:    Film/video editor (Medical):    Lack of Transportation (Non-Medical):   Physical Activity:    Days of Exercise per Week:    Minutes of Exercise per Session:   Stress:    Feeling  of Stress :   Social Connections:    Frequency of Communication with Friends and Family:    Frequency of Social Gatherings with Friends and Family:    Attends Religious Services:    Active Member of Clubs or Organizations:    Attends Archivist Meetings:    Marital Status:    Past Surgical History:  Procedure Laterality Date   ABDOMINAL HYSTERECTOMY  1982   BSO   ANTERIOR CERVICAL DECOMP/DISCECTOMY FUSION N/A 10/03/2014   Procedure: ANTERIOR CERVICAL DECOMPRESSION/DISCECTOMY FUSION 1 LEVEL,CERVICAL THREE-FOUR;  Surgeon: [name], MD;  Location: [facility];  Service: Neurosurgery;  Laterality: N/A;   APPENDECTOMY  1986   along with chole   BREAST BIOPSY  Right 1997   sclerosing adenosis -Dr. Lucia Gaskins   CARDIOVERSION N/A 04/29/2019   Procedure: CARDIOVERSION;  Surgeon: Lelon Perla, MD;  Location: [facility];  Service: Cardiovascular;  Laterality: N/A;   CHOLECYSTECTOMY     COLONOSCOPY     ESOPHAGOGASTRODUODENOSCOPY     with dilitation   LIPOMA EXCISION Left 10/1999   left thigh   lumbosacral cyst  2005   seen on injection for low back pain    LUNG BIOPSY  05/2001   /w Dr.Burney- bronchoscopy, mediastinoscopy   REVERSE SHOULDER ARTHROPLASTY Left 02/01/2016   Procedure: LEFT REVERSE TOTAL SHOULDER ARTHROPLASTY;  Surgeon: [name], MD;  Location: [facility];  Service: Orthopedics;  Laterality: Left;   rotator cuff  Bilateral 1996/1998   repair   ROTATOR CUFF REPAIR Left 1996; [place] Right 1996   TONSILLECTOMY     TOTAL HIP ARTHROPLASTY Left 10/2000   TOTAL HIP ARTHROPLASTY Right 06/2005   TOTAL HIP ARTHROPLASTY Left    10/2000   VAGINAL DELIVERY     x1   Past Medical History:  Diagnosis Date   Asthmatic bronchitis    Atrial fibrillation ([place]) 2019   Cataracts, bilateral    immature   Chronic back pain    arthritis.Stenosis   Dysphagia    Dysrhythmia    (?or extra beats) treated /w atenolol   Emphysema lung (HCC)    left   GERD (gastroesophageal reflux disease)    takes Omeprazole daily   Hard of hearing    wears hearing aides    History of blood transfusion    no abnormal reaction noted   History of gastric ulcer    Hypercholesteremia    takes Atorvastatin daily   Hypertension    takes Coreg and Losartan daily   Hypothyroidism    takes Synthroid daily   IBS (irritable bowel syndrome)    takes Librax daily   Joint pain    Lumbosacral spondylosis without myelopathy    Melanoma ([place])    on left leg, wide excision    Muscle spasm    takes Robaxin daily as needed   Neuromuscular disorder (HCC)    esophageal spasms at times    Osteoarthritis     Peripheral edema    take HCTZ daily   Pneumonia    hx of.Many yrs ago   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   developed into pneumonia, resolved within a year   Seasonal allergies    takes Allegra daily and uses Flonase daily as needed   Shortness of breath dyspnea    with exertion   Venous stasis    Yeast infection    given Diflucan today   BP (!) 156/88    Pulse 88    Temp  97.7 F (36.5 C)    Ht 5' 2.5" (1.588 m)    Wt 117 lb (53.1 kg)    LMP 11/03/1980 (Approximate)    SpO2 94%    BMI 21.06 kg/m   Opioid Risk Score:   Fall Risk Score:  `1  Depression screen PHQ 2/9  Depression screen [facility] 2/9 12/14/2018 11/05/2018 09/13/2018 05/13/2018 04/08/2018 03/10/2018 11/23/2017  Decreased Interest 0 0 0 0 - 0 0  Down, Depressed, Hopeless 0 0 0 0 - 0 0  PHQ - 2 Score 0 0 0 0 - 0 0  Altered sleeping - - - - 0 - -  Tired, decreased energy - - - - - - -  Change in appetite - - - - - - -  Feeling bad or failure about yourself  - - - - - - -  Trouble concentrating - - - - - - -  Moving slowly or fidgety/restless - - - - - - -  Suicidal thoughts - - - - - - -  PHQ-9 Score - - - - - - -  Some recent data might be hidden    Review of Systems  Constitutional: Negative.   HENT: Negative.   Eyes: Negative.   Respiratory: Negative.   Cardiovascular: Negative.   Gastrointestinal: Positive for constipation.  Endocrine: Negative.   Genitourinary: Negative.   Musculoskeletal: Positive for arthralgias, back pain and gait problem.  Skin: Negative.   Allergic/Immunologic: Negative.   Hematological: Negative.   Psychiatric/Behavioral: Negative.   All other systems reviewed and are negative.      Objective:   Physical Exam Vitals and nursing note reviewed.  Constitutional:      Appearance: Normal appearance.  Cardiovascular:     Rate and Rhythm: Normal rate and regular rhythm.     Pulses: Normal pulses.     Heart sounds: Normal heart sounds.  Pulmonary:     Effort: Pulmonary effort is  normal.     Breath sounds: Normal breath sounds.  Musculoskeletal:     Cervical back: Normal range of motion and neck supple.     Comments: Normal Muscle Bulk and Muscle Testing Reveals:  Upper Extremities: Decreased ROM 90 Degrees and Muscle Strength  5/5 Right AC Joint Tenderness Thoracic Paraspinal Tenderness: T-7-T-9 Mainly Right Side Lumbar Paraspinal Tenderness: L-3-L-5 Lower Extremities: Full ROM and Muscle Strength 5/5 Arises from Table Slowly using cane for support Narrow Based  Gait   Skin:    General: Skin is warm and dry.  Neurological:     Mental Status: She is alert and oriented to person, place, and time.  Psychiatric:        Mood and Affect: Mood normal.        Behavior: Behavior normal.           Assessment & Plan:  1.Chronic low back pain withlumbar spondylosis: Continue withSlow Weaning:ContinueOxycodone. 5mg  one tablettwice a dayas needed #45.04/24/2020 We will continue the opioid monitoring program,this consists of regular clinic visits, examinations, urine drug screen, pill counts as well as use of [place] Controlled Substance reporting System. 2. Lumbar Radiculitis:S/PLumbar Radiofrequency with relief noted.Adverse Reaction with Gabapentin and Amitriptyline we willContinue to monitor.04/24/2020. 3. Cervicalgia/Cervical Spondylosis/Cervical stenosis with radiculitis/ S/P Anterior Cervical Fusion C3-C4 with Dr. Ellene Route on 10/03/2014.3-level anterior decompression and arthrodesis per [facility] on hold.Continue to Monitor.Dr. Ellene Route Following.04/24/2020 4. ChronicRightshoulder pain:Continue with Current medication regime.04/24/2020 S/P Left Reverse Total Shoulder Arthroplasty on 02/01/16 via Dr. Veverly Fells. 5. Chronic Right- sided  Thoracic Back pain: Continue HEP as Tolerated. Continue to monitor.04/24/2020. 6. Muscle Spasm:No complaints today.Continueto monitor.04/24/2020. 7. Fatigue:No complaints  today.Cardiology Following. Continue to Monitor. 04/24/2020  F/U in 2 months  20 minutes of face to face patient care time was spent during this visit. All questions were encouraged and answered.

## 2020-04-27 ENCOUNTER — Ambulatory Visit (INDEPENDENT_AMBULATORY_CARE_PROVIDER_SITE_OTHER): Payer: Medicare Other

## 2020-04-27 ENCOUNTER — Ambulatory Visit
Admission: EM | Admit: 2020-04-27 | Discharge: 2020-04-27 | Disposition: A | Discharge status: Home / Self Care | Payer: Medicare Other | Attending: Physician Assistant | Admitting: Physician Assistant

## 2020-04-27 DIAGNOSIS — W010XXA Fall on same level from slipping, tripping and stumbling without subsequent striking against object, initial encounter: Secondary | ICD-10-CM | POA: Diagnosis not present

## 2020-04-27 DIAGNOSIS — S92412A Displaced fracture of proximal phalanx of left great toe, initial encounter for closed fracture: Secondary | ICD-10-CM

## 2020-04-27 MED ORDER — MUPIROCIN 2 % EX OINT: 1.0000 "application " | TOPICAL_OINTMENT | Freq: Two times a day (BID) | CUTANEOUS | 0 refills | Status: DC

## 2020-04-27 NOTE — Discharge Instructions (Addendum)
As discussed, left great toe with fracture. Please continue to wear your post of shoe until follow up with orthopedics. The wound between your toe is not deep and does not extend to the bone. Keep it clean and dry. You can dress with bactroban daily. Ice compress to the foot and keep elevated to help with swelling. Monitor for spreading redness, warmth. Follow up with orthopedics for further evaluation needed.

## 2020-04-27 NOTE — ED Triage Notes (Signed)
Pt states twisted her lt ankle last night and fell slightly on lt side. States EMS came out and evaluated her. Pt c/o lt ankle and foot pain with swelling and bruising noted.

## 2020-04-27 NOTE — ED Provider Notes (Signed)
[facility]    CSN: [id number] Arrival date & time: 04/27/20  1321      History   Chief Complaint Chief Complaint  Patient presents with  . Ankle Pain    HPI Jenna [name] is a 86 y.o. female.   86 year old female with history of afib on eliquis, asthma, sarcoidosis, HLD, hypothyroidism comes in for left ankle/foot pain after injury last night. States left foot caught on something while turning, causing inversion of ankle and falling. Denies head injury, loss of consciousness. Called EMS, who evaluated her. She has swelling, contusion to the left ankle and foot. States pain is most significant to the left lateral foot. Possible wound due to dried blood.      Past Medical History:  Diagnosis Date  . Asthmatic bronchitis   . Atrial fibrillation ([place]) 2019  . Cataracts, bilateral    immature  . Chronic back pain    arthritis.Stenosis  . Dysphagia   . Dysrhythmia    (?or extra beats) treated /w atenolol  . Emphysema lung ([place])    left  . GERD (gastroesophageal reflux disease)    takes Omeprazole daily  . Hard of hearing    wears hearing aides   . History of blood transfusion    no abnormal reaction noted  . History of gastric ulcer   . Hypercholesteremia    takes Atorvastatin daily  . Hypertension    takes Coreg and Losartan daily  . Hypothyroidism    takes Synthroid daily  . IBS (irritable bowel syndrome)    takes Librax daily  . Joint pain   . Lumbosacral spondylosis without myelopathy   . Melanoma ([place])    on left leg, wide excision   . Muscle spasm    takes Robaxin daily as needed  . Neuromuscular disorder ([place])    esophageal spasms at times   . Osteoarthritis   . Peripheral edema    take HCTZ daily  . Pneumonia    hx of.Many yrs ago  . 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 Seasonal allergies    takes Allegra daily and uses Flonase daily as needed  . Shortness of breath dyspnea     with exertion  . Venous stasis   . Yeast infection    given Diflucan today    Patient Active Problem List   Diagnosis Date Noted  . Medication side effects 08/25/2019  . Fatigue 06/01/2019  . PAF (paroxysmal atrial fibrillation) ([place]) 05/12/2019  . Chronic anticoagulation 05/12/2019  . Essential hypertension 05/12/2019  . Hypothyroidism 05/12/2019  . Lumbosacral spondylosis without myelopathy 01/23/2017  . S/P shoulder replacement 02/01/2016  . Cervical spondylosis with myelopathy 10/03/2014  . Spondylosis of cervical joint 07/07/2012  . Chronic pain 02/10/2012  . Rotator cuff tear arthropathy of both shoulders 02/10/2012  . Lumbar spondylosis 02/10/2012    Past Surgical History:  Procedure Laterality Date  . ABDOMINAL HYSTERECTOMY  1982   BSO  . ANTERIOR CERVICAL DECOMP/DISCECTOMY FUSION N/A 10/03/2014   Procedure: ANTERIOR CERVICAL DECOMPRESSION/DISCECTOMY FUSION 1 LEVEL,CERVICAL THREE-FOUR;  Surgeon: [name], MD;  Location: [place] [facility]UR[facility] ORS;  Service: Neurosurgery;  Laterality: N/A;  . APPENDECTOMY  1986   along with chole  . BREAST BIOPSY Right 1997   sclerosing adenosis -Dr. Lucia Gaskins  . CARDIOVERSION N/A 04/29/2019   Procedure: CARDIOVERSION;  Surgeon: Lelon Perla, MD;  Location: [facility] ENDOSCOPY;  Service: Cardiovascular;  Laterality: N/A;  . CHOLECYSTECTOMY    .  COLONOSCOPY    . ESOPHAGOGASTRODUODENOSCOPY     with dilitation  . LIPOMA EXCISION Left 10/1999   left thigh  . lumbosacral cyst  2005   seen on injection for low back pain   . LUNG BIOPSY  05/2001   /w Dr.Burney- bronchoscopy, mediastinoscopy  . REVERSE SHOULDER ARTHROPLASTY Left 02/01/2016   Procedure: LEFT REVERSE TOTAL SHOULDER ARTHROPLASTY;  Surgeon: [name], MD;  Location: [facility];  Service: Orthopedics;  Laterality: Left;  . rotator cuff  Bilateral 1996/1998   repair  . ROTATOR CUFF REPAIR Left 1996; 1981  . ROTATOR CUFF REPAIR Right 1996  . TONSILLECTOMY    . TOTAL HIP ARTHROPLASTY Left  10/2000  . TOTAL HIP ARTHROPLASTY Right 06/2005  . TOTAL HIP ARTHROPLASTY Left    10/2000  . VAGINAL DELIVERY     x1    OB History    Gravida  1   Para  1   Term  1   Preterm  0   AB  0   Living  1     SAB  0   TAB  0   Ectopic  0   Multiple  0   Live Births  1            Home Medications    Prior to Admission medications   Medication Sig Start Date End Date Taking? Authorizing Provider  albuterol (PROVENTIL HFA;VENTOLIN HFA) 108 (90 Base) MCG/ACT inhaler Inhale 2 puffs into the lungs every 6 (six) hours as needed for wheezing or shortness of breath.    [provider]  atorvastatin (LIPITOR) 20 MG tablet Take 20 mg by mouth daily.    [provider]  Calcium Carbonate-Vitamin D (CALCIUM 600+D PO) Take 1 tablet by mouth daily.    [provider]  carvedilol (COREG) 6.25 MG tablet Take 1 tablet (6.25 mg total) by mouth 2 (two) times daily with a meal. 08/29/19   Kilroy, Doreene Burke, PA-C  cholecalciferol (VITAMIN D) 1000 UNITS tablet Take 1,000 Units by mouth daily.    [provider]  diazepam (VALIUM) 5 MG tablet Take 2.5 mg by mouth at bedtime as needed for sedation.  02/05/15   [provider]  Eflornithine HCl (VANIQA) 13.9 % cream Vaniqa 13.9 % topical cream  APPLY TO AFFECTED AREA TWICE A DAY AS DIRECTED    [provider]  ELIQUIS 2.5 MG TABS tablet TAKE 1 TABLET BY MOUTH 2 TIMES DAILY. 11/17/19   Martinique, Peter M, MD  fluticasone [facility]) 50 MCG/ACT nasal spray Place 2 sprays into both nostrils daily.     [provider]  furosemide (LASIX) 20 MG tablet Take 20 mg by mouth as needed.     [provider]  hyoscyamine (LEVSIN SL) 0.125 MG SL tablet Place 0.125 mg under the tongue every 4 (four) hours as needed for cramping.     [provider]  levothyroxine (SYNTHROID, LEVOTHROID) 112 MCG tablet Take 112 mcg by mouth daily before breakfast.    [provider]  losartan  (COZAAR) 100 MG tablet Take 1 tablet (100 mg total) by mouth daily. 08/03/15   Bayard Hugger, NP  mupirocin ointment (BACTROBAN) 2 % Apply 1 application topically 2 (two) times daily. 04/27/20   Tasia Catchings, Shakyia Bosso V, PA-C  naphazoline-pheniramine (NAPHCON-A) 0.025-0.3 % ophthalmic solution Place 1 drop into both eyes 4 (four) times daily as needed for eye irritation.    [provider]  omeprazole (PRILOSEC) 40 MG capsule Take  40 mg by mouth at bedtime.     [provider]  oxyCODONE (OXY IR/ROXICODONE) 5 MG immediate release tablet Take 1 tablet (5 mg total) by mouth 2 (two) times daily as needed for moderate pain. 04/24/20   Bayard Hugger, NP  triamcinolone cream (KENALOG) 0.1 % USE AS DIRECTED 07/12/19   [provider]    Family History Family History  Problem Relation Age of Onset  . Stroke Mother   . Heart attack Father   . Diabetes Brother   . Heart disease Brother     Social History Social History   Tobacco Use  . Smoking status: Former Smoker    Packs/day: 1.00    Years: 15.00    Pack years: 15.00  . Smokeless tobacco: Never Used  . Tobacco comment: quit smoking in 1985  Vaping Use  . Vaping Use: Never used  Substance Use Topics  . Alcohol use: No  . Drug use: No     Allergies   Codeine, Pseudoephedrine hcl, Tylenol [acetaminophen], Ultram [tramadol], Vicodin [hydrocodone-acetaminophen], Dalmane [flurazepam hcl], Nortriptyline, Cephalexin, Clarithromycin, Levofloxacin, and Vibramycin [doxycycline calcium]   Review of Systems Review of Systems  Reason unable to perform ROS: See HPI as above.     Physical Exam Triage Vital Signs ED Triage Vitals  Enc Vitals Group     BP 04/27/20 1340 (!) 162/82     Pulse Rate 04/27/20 1340 94     Resp 04/27/20 1340 18     Temp 04/27/20 1340 99.1 F (37.3 C)     Temp Source 04/27/20 1340 Oral     SpO2 04/27/20 1340 97 %     Weight --      Height --      Head Circumference --      Peak Flow --       Pain Score 04/27/20 1342 3     Pain Loc --      Pain Edu? --      Excl. in [place]? --    No data found.  Updated Vital Signs BP (!) 162/82 (BP Location: Left Arm)   Pulse 94   Temp 99.1 F (37.3 C) (Oral)   Resp 18   LMP 11/03/1980 (Approximate)   SpO2 97%   Physical Exam Constitutional:      General: She is not in acute distress.    Appearance: Normal appearance. She is well-developed. She is not toxic-appearing or diaphoretic.  HENT:     Head: Normocephalic and atraumatic.  Eyes:     Conjunctiva/sclera: Conjunctivae normal.     Pupils: Pupils are equal, round, and reactive to light.  Pulmonary:     Effort: Pulmonary effort is normal. No respiratory distress.     Comments: Speaking in full sentences without difficulty Musculoskeletal:     Cervical back: Normal range of motion and neck supple.  Skin:    General: Skin is warm and dry.     Comments: Significant swelling to the left ankle and foot with contusion to the dorsal foot and toes. Dried blood noted to webspace of toes. Tenderness to palpation of mid dorsal ankle and along proximal 4th-5th MTP. Dried blood with abrasions to the webspace of 1st-2nd, 2nd-3rd toe. Full ROM of ankle. NVI  Neurological:     Mental Status: She is alert and oriented to person, place, and time.    UC Treatments / Results  Labs (all labs ordered are listed, but only abnormal results are displayed) Labs Reviewed - No  data to display  EKG   Radiology DG Ankle Complete Left  Result Date: 04/27/2020 CLINICAL DATA:  Left ankle pain after injury yesterday. EXAM: LEFT ANKLE COMPLETE - 3+ VIEW COMPARISON:  None. FINDINGS: There is no evidence of fracture, dislocation, or joint effusion. There is no evidence of arthropathy or other focal bone abnormality. Mild soft tissue swelling is seen medially and laterally. IMPRESSION: No fracture or dislocation is noted. Mild soft tissue swelling is seen medially and laterally. Electronically Signed   By: Marijo Conception M.D.   On: 04/27/2020 14:44   DG Foot Complete Left  Result Date: 04/27/2020 CLINICAL DATA:  Left foot pain after injury last night. EXAM: LEFT FOOT - COMPLETE 3+ VIEW COMPARISON:  None. FINDINGS: Mildly displaced fracture is seen involving the lateral aspect of the proximal base of the first proximal phalanx with intra-articular extension. Mild hallux valgus deformity of this joint is noted as well. No other bony abnormality is noted. IMPRESSION: Mildly displaced first proximal phalangeal fracture with intra-articular extension. Electronically Signed   By: Marijo Conception M.D.   On: 04/27/2020 14:42    Procedures Procedures (including critical care time)  Medications Ordered in UC Medications - No data to display  Initial Impression / Assessment and Plan / UC Course  I have reviewed the triage vital signs and the nursing notes.  Pertinent labs & imaging results that were available during my care of the patient were reviewed by me and considered in my medical decision making (see chart for details).    Discussed xray results with patient. Superficial abrasion to the webspace of 1st-2nd digit, does not extend to the fracture. Post op boot as directed. Wound care instructions given. Follow up with orthopedics for further evaluation. Return precautions given.  Final Clinical Impressions(s) / UC Diagnoses   Final diagnoses:  Closed displaced fracture of proximal phalanx of left great toe, initial encounter   ED Prescriptions    Medication Sig Dispense Auth. Provider   mupirocin ointment (BACTROBAN) 2 % Apply 1 application topically 2 (two) times daily. 22 g Ok Edwards, PA-C     PDMP not reviewed this encounter.   Ok Edwards, PA-C 04/27/20 1728

## 2020-05-23 ENCOUNTER — Encounter: Payer: Self-pay | Admitting: Physician Assistant

## 2020-05-23 ENCOUNTER — Ambulatory Visit: Payer: Medicare Other | Admitting: Physician Assistant

## 2020-05-23 ENCOUNTER — Other Ambulatory Visit: Payer: Self-pay

## 2020-05-23 VITALS — BP 140/87 | HR 69 | Temp 97.9°F | Ht 62.0 in | Wt 118.6 lb

## 2020-05-23 DIAGNOSIS — E785 Hyperlipidemia, unspecified: Secondary | ICD-10-CM

## 2020-05-23 DIAGNOSIS — E039 Hypothyroidism, unspecified: Secondary | ICD-10-CM | POA: Diagnosis not present

## 2020-05-23 DIAGNOSIS — I429 Cardiomyopathy, unspecified: Secondary | ICD-10-CM

## 2020-05-23 DIAGNOSIS — I480 Paroxysmal atrial fibrillation: Secondary | ICD-10-CM

## 2020-05-23 DIAGNOSIS — I10 Essential (primary) hypertension: Secondary | ICD-10-CM | POA: Diagnosis not present

## 2020-05-23 NOTE — Patient Instructions (Signed)
Medication Instructions:  None If you need a refill on your cardiac medications before your next appointment, please call your pharmacy*   Lab Work: None If you have labs (blood work) drawn today and your tests are completely normal, you will receive your results only by: [name] MyChart Message (if you have MyChart) OR . A paper copy in the mail If you have any lab test that is abnormal or we need to change your treatment, we will call you to review the results.   Testing/Procedures: NONE   Follow-Up: At [facility], you and your health needs are our priority.  As part of our continuing mission to provide you with exceptional heart care, we have created designated Provider Care Teams.  These Care Teams include your primary Cardiologist (physician) and Advanced Practice Providers (APPs -  Physician Assistants and Nurse Practitioners) who all work together to provide you with the care you need, when you need it.  We recommend signing up for the patient portal called "MyChart".  Sign up information is provided on this After Visit Summary.  MyChart is used to connect with patients for Virtual Visits (Telemedicine).  Patients are able to view lab/test results, encounter notes, upcoming appointments, etc.  Non-urgent messages can be sent to your provider as well.   To learn more about what you can do with MyChart, go to [email or web address].    Your next appointment:   6 month(s)  The format for your next appointment:   In Person  Provider:   Peter Martinique, MD

## 2020-05-23 NOTE — Progress Notes (Signed)
Cardiology Office Note:    Date:  05/25/2020   ID:  Jenna [name], DOB [birth year], MRN [id number]  PCP:  [name], MD  [facility] Cardiologist:  Peter Martinique, MD  [place] Electrophysiologist:  None   Referring MD: [name], MD   Chief Complaint  Patient presents with  . Follow-up    seen for Dr. Martinique    History of Present Illness:    Jenna [name] is a 86 y.o. female with a hx of PAF, GERD, hypertension, hyperlipidemia, and hypothyroidism.  Patient was first diagnosed with atrial fibrillation in January 2020.  However she suspected her A. fib may have dated back to September 2019 when she first noticed fatigue.  She was started on Eliquis 2.5 mg twice a day given her age and BMI.  Echocardiogram performed on January 12, 2019 showed EF 45 to 50% with moderate LAE.  She was initially supposed to undergo cardioversion, this was delayed due to onset of COVID-19 pandemic.  She ultimately underwent DCCV on 04/29/2019.  She had recurrence in July 2020, amiodarone was added. She subsequently converted back to sinus rhythm on amiodarone therapy.  Due to side effect, amiodarone was initially reduced and then eventually discontinued in October 2020.  She presents today for 47-month follow-up.  She has been doing well for the past 6 months.  She is independent and denies any recent chest pain or dyspnea on exertion.  She has no lower extremity edema, orthopnea or PND.  EKG continue to show normal sinus rhythm.  According to the patient, her son had similar course like her when it comes to atrial fibrillation and tachycardia mediated cardiomyopathy.  They are on similar medications with exception of the fact that her son was able to tolerate amiodarone and she could not.  Overall, she has been doing quite well and can follow-up in 6 months.   Past Medical History:  Diagnosis Date  . Asthmatic bronchitis   . Atrial fibrillation ([place]) 2019  . Cataracts, bilateral     immature  . Chronic back pain    arthritis.Stenosis  . Dysphagia   . Dysrhythmia    (?or extra beats) treated /w atenolol  . Emphysema lung ([place])    left  . GERD (gastroesophageal reflux disease)    takes Omeprazole daily  . Hard of hearing    wears hearing aides   . History of blood transfusion    no abnormal reaction noted  . History of gastric ulcer   . Hypercholesteremia    takes Atorvastatin daily  . Hypertension    takes Coreg and Losartan daily  . Hypothyroidism    takes Synthroid daily  . IBS (irritable bowel syndrome)    takes Librax daily  . Joint pain   . Lumbosacral spondylosis without myelopathy   . Melanoma ([place])    on left leg, wide excision   . Muscle spasm    takes Robaxin daily as needed  . Neuromuscular disorder ([place])    esophageal spasms at times   . Osteoarthritis   . Peripheral edema    take HCTZ daily  . Pneumonia    hx of.Many yrs ago  . 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 Seasonal allergies    takes Allegra daily and uses Flonase daily as needed  . Shortness of breath dyspnea    with exertion  . Venous stasis   . Yeast infection    given  Diflucan today    Past Surgical History:  Procedure Laterality Date  . ABDOMINAL HYSTERECTOMY  1982   BSO  . ANTERIOR CERVICAL DECOMP/DISCECTOMY FUSION N/A 10/03/2014   Procedure: ANTERIOR CERVICAL DECOMPRESSION/DISCECTOMY FUSION 1 LEVEL,CERVICAL THREE-FOUR;  Surgeon: [name], MD;  Location: [facility];  Service: Neurosurgery;  Laterality: N/A;  . APPENDECTOMY  1986   along with chole  . BREAST BIOPSY Right 1997   sclerosing adenosis -Dr. Lucia Gaskins  . CARDIOVERSION N/A 04/29/2019   Procedure: CARDIOVERSION;  Surgeon: Lelon Perla, MD;  Location: [facility] ENDOSCOPY;  Service: Cardiovascular;  Laterality: N/A;  . CHOLECYSTECTOMY    . COLONOSCOPY    . ESOPHAGOGASTRODUODENOSCOPY     with dilitation  . LIPOMA EXCISION Left 10/1999   left thigh  .  lumbosacral cyst  2005   seen on injection for low back pain   . LUNG BIOPSY  05/2001   /w Dr.Burney- bronchoscopy, mediastinoscopy  . REVERSE SHOULDER ARTHROPLASTY Left 02/01/2016   Procedure: LEFT REVERSE TOTAL SHOULDER ARTHROPLASTY;  Surgeon: [name], MD;  Location: [facility];  Service: Orthopedics;  Laterality: Left;  . rotator cuff  Bilateral 1996/1998   repair  . ROTATOR CUFF REPAIR Left 1996; 1981  . ROTATOR CUFF REPAIR Right 1996  . TONSILLECTOMY    . TOTAL HIP ARTHROPLASTY Left 10/2000  . TOTAL HIP ARTHROPLASTY Right 06/2005  . TOTAL HIP ARTHROPLASTY Left    10/2000  . VAGINAL DELIVERY     x1    Current Medications: Current Meds  Medication Sig  . albuterol (PROVENTIL HFA;VENTOLIN HFA) 108 (90 Base) MCG/ACT inhaler Inhale 2 puffs into the lungs every 6 (six) hours as needed for wheezing or shortness of breath.  [name] atorvastatin (LIPITOR) 20 MG tablet Take 20 mg by mouth daily.  . Calcium Carbonate-Vitamin D (CALCIUM 600+D PO) Take 1 tablet by mouth daily.  . carvedilol (COREG) 6.25 MG tablet Take 1 tablet (6.25 mg total) by mouth 2 (two) times daily with a meal.  . cholecalciferol (VITAMIN D) 1000 UNITS tablet Take 1,000 Units by mouth daily.  . diazepam (VALIUM) 5 MG tablet Take 2.5 mg by mouth at bedtime as needed for sedation.   . Eflornithine HCl (VANIQA) 13.9 % cream Vaniqa 13.9 % topical cream  APPLY TO AFFECTED AREA TWICE A DAY AS DIRECTED  . ELIQUIS 2.5 MG TABS tablet TAKE 1 TABLET BY MOUTH 2 TIMES DAILY.  . fluticasone (FLONASE) 50 MCG/ACT nasal spray Place 2 sprays into both nostrils daily.   . furosemide (LASIX) 20 MG tablet Take 20 mg by mouth as needed.   . hyoscyamine (LEVSIN SL) 0.125 MG SL tablet Place 0.125 mg under the tongue every 4 (four) hours as needed for cramping.   [name] levothyroxine (SYNTHROID, LEVOTHROID) 112 MCG tablet Take 112 mcg by mouth daily before breakfast.  . losartan (COZAAR) 100 MG tablet Take 1 tablet (100 mg total) by mouth daily.  .  mupirocin ointment (BACTROBAN) 2 % Apply 1 application topically 2 (two) times daily.  . naphazoline-pheniramine (NAPHCON-A) 0.025-0.3 % ophthalmic solution Place 1 drop into both eyes 4 (four) times daily as needed for eye irritation.  [name] omeprazole (PRILOSEC) 40 MG capsule Take 40 mg by mouth at bedtime.   [name] oxyCODONE (OXY IR/ROXICODONE) 5 MG immediate release tablet Take 1 tablet (5 mg total) by mouth 2 (two) times daily as needed for moderate pain.  [name] triamcinolone cream (KENALOG) 0.1 % USE AS DIRECTED     Allergies:   Codeine, Pseudoephedrine hcl,  Tylenol [acetaminophen], Ultram [tramadol], Vicodin [hydrocodone-acetaminophen], Dalmane [flurazepam hcl], Nortriptyline, Cephalexin, Clarithromycin, Levofloxacin, and Vibramycin [doxycycline calcium]   Social History   Socioeconomic History  . Marital status: Married    Spouse name: Not on file  . Number of children: Not on file  . Years of education: Not on file  . Highest education level: Not on file  Occupational History  . Not on file  Tobacco Use  . Smoking status: Former Smoker    Packs/day: 1.00    Years: 15.00    Pack years: 15.00  . Smokeless tobacco: Never Used  . Tobacco comment: quit smoking in 1985  Vaping Use  . Vaping Use: Never used  Substance and Sexual Activity  . Alcohol use: No  . Drug use: No  . Sexual activity: Not Currently    Partners: Male    Birth control/protection: Surgical    Comment: hysterectomy  Other Topics Concern  . Not on file  Social History Narrative  . Not on file   Social Determinants of Health   Financial Resource Strain:   . Difficulty of Paying Living Expenses:   Food Insecurity:   . Worried About Charity fundraiser in the Last Year:   . Arboriculturist in the Last Year:   Transportation Needs:   . Film/video editor (Medical):   [name] Lack of Transportation (Non-Medical):   Physical Activity:   . Days of Exercise per Week:   . Minutes of Exercise per Session:   Stress:    . Feeling of Stress :   Social Connections:   . Frequency of Communication with Friends and Family:   . Frequency of Social Gatherings with Friends and Family:   . Attends Religious Services:   . Active Member of Clubs or Organizations:   . Attends Archivist Meetings:   [name] Marital Status:      Family History: The patient's family history includes Diabetes in her brother; Heart attack in her father; Heart disease in her brother; Stroke in her mother.  ROS:   Please see the history of present illness.     All other systems reviewed and are negative.  EKGs/Labs/Other Studies Reviewed:    The following studies were reviewed today:  Echo 01/12/2019 1. The left ventricle has mildly reduced systolic function, with an  ejection fraction of 45-50%. The cavity size was normal. Left ventricular  diastolic function could not be evaluated secondary to atrial  fibrillation. Left ventrical global hypokinesis  without regional wall motion abnormalities.  2. The right ventricle has normal systolic function. The cavity was  normal. There is no increase in right ventricular wall thickness.  3. Left atrial size was moderately dilated.  4. The aortic valve is tricuspid Moderate thickening of the aortic valve  Moderate calcification of the aortic valve. Aortic valve regurgitation was  not assessed by color flow Doppler.  EKG:  EKG is ordered today.  The ekg ordered today demonstrates normal sinus rhythm, no significant ST-T wave changes.  Recent Labs: 08/02/2019: ALT 17; BUN 9; Creatinine, Ser 0.88; Hemoglobin 13.9; Platelets 295; Potassium 4.5; Sodium 137; TSH 2.930  Recent Lipid Panel No results found for: CHOL, TRIG, HDL, CHOLHDL, VLDL, LDLCALC, LDLDIRECT  Physical Exam:    VS:  BP 140/87   Pulse 69   Temp 97.9 F (36.6 C)   Ht 5\' 2"  (1.575 m)   Wt 118 lb 9.6 oz (53.8 kg)   LMP 11/03/1980 (Approximate)   SpO2 98%  BMI 21.69 kg/m     Wt Readings from Last 3 Encounters:   05/23/20 118 lb 9.6 oz (53.8 kg)  04/24/20 117 lb (53.1 kg)  02/20/20 122 lb (55.3 kg)     GEN:  Well nourished, well developed in no acute distress HEENT: Normal NECK: No JVD; No carotid bruits LYMPHATICS: No lymphadenopathy CARDIAC: RRR, no murmurs, rubs, gallops RESPIRATORY:  Clear to auscultation without rales, wheezing or rhonchi  ABDOMEN: Soft, non-tender, non-distended MUSCULOSKELETAL:  No edema; No deformity  SKIN: Warm and dry NEUROLOGIC:  Alert and oriented x 3 PSYCHIATRIC:  Normal affect   ASSESSMENT:    1. PAF (paroxysmal atrial fibrillation) ([place])   2. Essential hypertension   3. Hyperlipidemia LDL goal <100   4. Hypothyroidism, unspecified type   5. Cardiomyopathy, unspecified type ([place])    PLAN:    In order of problems listed above:  1. PAF: She is maintaining sinus rhythm.  Continue Eliquis.  She is intolerant of amiodarone therapy  2. Hypertension: Blood pressure stable  3. Hyperlipidemia: On Lipitor  4. Hypothyroidism: Continue levothyroxine  5. 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  Ejection fraction 45 to 50% on last echocardiogram in March 2020.  She is euvolemic on exam.   Medication Adjustments/Labs and Tests Ordered: Current medicines are reviewed at length with the patient today.  Concerns regarding medicines are outlined above.  Orders Placed This Encounter  Procedures  . EKG 12-Lead   No orders of the defined types were placed in this encounter.   Patient Instructions  Medication Instructions:  None If you need a refill on your cardiac medications before your next appointment, please call your pharmacy*   Lab Work: None If you have labs (blood work) drawn today and your tests are completely normal, you will receive your results only by: [name] MyChart Message (if you have MyChart) OR . A paper copy in the mail If you have any lab test that is abnormal or we need to change your treatment, we will call you to review  the results.   Testing/Procedures: NONE   Follow-Up: At [facility], you and your health needs are our priority.  As part of our continuing mission to provide you with exceptional heart care, we have created designated Provider Care Teams.  These Care Teams include your primary Cardiologist (physician) and Advanced Practice Providers (APPs -  Physician Assistants and Nurse Practitioners) who all work together to provide you with the care you need, when you need it.  We recommend signing up for the patient portal called "MyChart".  Sign up information is provided on this After Visit Summary.  MyChart is used to connect with patients for Virtual Visits (Telemedicine).  Patients are able to view lab/test results, encounter notes, upcoming appointments, etc.  Non-urgent messages can be sent to your provider as well.   To learn more about what you can do with MyChart, go to [email or web address].    Your next appointment:   6 month(s)  The format for your next appointment:   In Person  Provider:   Peter Martinique, MD       Signed, Almyra Deforest, [place]  05/25/2020 11:13 AM    [place]

## 2020-05-25 ENCOUNTER — Encounter: Payer: Self-pay | Admitting: Physician Assistant

## 2020-06-05 ENCOUNTER — Encounter: Payer: Medicare Other | Admitting: Registered Nurse

## 2020-06-14 ENCOUNTER — Encounter: Payer: Self-pay | Admitting: Physical Medicine & Rehabilitation

## 2020-06-14 ENCOUNTER — Other Ambulatory Visit: Payer: Self-pay

## 2020-06-14 ENCOUNTER — Encounter: Payer: Medicare Other | Attending: Physical Medicine and Rehabilitation | Admitting: Physical Medicine & Rehabilitation

## 2020-06-14 VITALS — BP 163/94 | HR 94 | Temp 97.9°F | Ht 62.0 in | Wt 117.0 lb

## 2020-06-14 DIAGNOSIS — G894 Chronic pain syndrome: Secondary | ICD-10-CM | POA: Insufficient documentation

## 2020-06-14 DIAGNOSIS — M47816 Spondylosis without myelopathy or radiculopathy, lumbar region: Secondary | ICD-10-CM | POA: Insufficient documentation

## 2020-06-14 DIAGNOSIS — Z79891 Long term (current) use of opiate analgesic: Secondary | ICD-10-CM | POA: Diagnosis present

## 2020-06-14 DIAGNOSIS — Z79899 Other long term (current) drug therapy: Secondary | ICD-10-CM | POA: Insufficient documentation

## 2020-06-14 DIAGNOSIS — M47812 Spondylosis without myelopathy or radiculopathy, cervical region: Secondary | ICD-10-CM | POA: Diagnosis present

## 2020-06-14 DIAGNOSIS — M12812 Other specific arthropathies, not elsewhere classified, left shoulder: Secondary | ICD-10-CM | POA: Diagnosis present

## 2020-06-14 DIAGNOSIS — Z5181 Encounter for therapeutic drug level monitoring: Secondary | ICD-10-CM | POA: Diagnosis present

## 2020-06-14 DIAGNOSIS — M47817 Spondylosis without myelopathy or radiculopathy, lumbosacral region: Secondary | ICD-10-CM | POA: Diagnosis not present

## 2020-06-14 DIAGNOSIS — M12811 Other specific arthropathies, not elsewhere classified, right shoulder: Secondary | ICD-10-CM | POA: Diagnosis present

## 2020-06-14 NOTE — Progress Notes (Signed)
Subjective:    Patient ID: Jenna [name], female    DOB: Jun 09, 1933, 86 y.o.   MRN: [id number]  HPI 86 year old female with history of chronic low back pain that has been relieved on several occasions by lumbar medial branch blocks at L3-L4 medial branches and L5 dorsal ramus.  Primarily has had pain on the right side as of late although in the last couple months it has moved bilateral.  She has very long-lasting relief with lumbar medial branch blocks, 6 months or more.  Her last injection was done greater than 1 year ago. In addition the patient has had chronic shoulder pain has had rotator cuff surgery bilaterally as well as left shoulder replacement Lost husband in May 2021.  The patient has been very active not only taking care of him just prior to his death but also taking care of issues around the house. Low back pain increased since that time, Used to have mainly RIght sided pain mow on both side  Taken off meperidine Taking tylenol 1/2  No Oxycodone x 1 week, 5mg  1/2 tab 2-3 a day as needed Stopped because of severe constipation    Last lumbar xrays showed severe Bilateral L3-4-5-S1 facet arthropathy  Fell ~7wks and twisted and fractured Left ankle and great toe, no longer wears Cam Walker boot , some left lateral foot numbness Pain Inventory Average Pain 5 Pain Right Now 3 My pain is constant and aching  In the last 24 hours, has pain interfered with the following? General activity 6 Relation with others 4 Enjoyment of life 5 What TIME of day is your pain at its worst? daytime and evening Sleep (in general) Fair  Pain is worse with: sitting and standing Pain improves with: rest, pacing activities and medication Relief from Meds: 5  Family History  Problem Relation Age of Onset  . Stroke Mother   . Heart attack Father   . Diabetes Brother   . Heart disease Brother    Social History   Socioeconomic History  . Marital status: Married    Spouse name: Not on  file  . Number of children: Not on file  . Years of education: Not on file  . Highest education level: Not on file  Occupational History  . Not on file  Tobacco Use  . Smoking status: Former Smoker    Packs/day: 1.00    Years: 15.00    Pack years: 15.00  . Smokeless tobacco: Never Used  . Tobacco comment: quit smoking in 1985  Vaping Use  . Vaping Use: Never used  Substance and Sexual Activity  . Alcohol use: No  . Drug use: No  . Sexual activity: Not Currently    Partners: Male    Birth control/protection: Surgical    Comment: hysterectomy  Other Topics Concern  . Not on file  Social History Narrative  . Not on file   Social Determinants of Health   Financial Resource Strain:   . Difficulty of Paying Living Expenses:   Food Insecurity:   . Worried About Charity fundraiser in the Last Year:   . Arboriculturist in the Last Year:   Transportation Needs:   . Film/video editor (Medical):   [name] Lack of Transportation (Non-Medical):   Physical Activity:   . Days of Exercise per Week:   . Minutes of Exercise per Session:   Stress:   . Feeling of Stress :   Social Connections:   . Frequency  of Communication with Friends and Family:   . Frequency of Social Gatherings with Friends and Family:   . Attends Religious Services:   . Active Member of Clubs or Organizations:   . Attends Archivist Meetings:   [name] Marital Status:    Past Surgical History:  Procedure Laterality Date  . ABDOMINAL HYSTERECTOMY  1982   BSO  . ANTERIOR CERVICAL DECOMP/DISCECTOMY FUSION N/A 10/03/2014   Procedure: ANTERIOR CERVICAL DECOMPRESSION/DISCECTOMY FUSION 1 LEVEL,CERVICAL THREE-FOUR;  Surgeon: [name], MD;  Location: [facility]UR[facility]S;  Service: Neurosurgery;  Laterality: N/A;  . APPENDECTOMY  1986   along with chole  . BREAST BIOPSY Right 1997   sclerosing adenosis -Dr. Lucia Gaskins  . CARDIOVERSION N/A 04/29/2019   Procedure: CARDIOVERSION;  Surgeon: Lelon Perla, MD;   Location: [facility] ENDOSCOPY;  Service: Cardiovascular;  Laterality: N/A;  . CHOLECYSTECTOMY    . COLONOSCOPY    . ESOPHAGOGASTRODUODENOSCOPY     with dilitation  . LIPOMA EXCISION Left 10/1999   left thigh  . lumbosacral cyst  2005   seen on injection for low back pain   . LUNG BIOPSY  05/2001   /w Dr.Burney- bronchoscopy, mediastinoscopy  . REVERSE SHOULDER ARTHROPLASTY Left 02/01/2016   Procedure: LEFT REVERSE TOTAL SHOULDER ARTHROPLASTY;  Surgeon: [name], MD;  Location: [facility];  Service: Orthopedics;  Laterality: Left;  . rotator cuff  Bilateral 1996/1998   repair  . ROTATOR CUFF REPAIR Left 1996; 1981  . ROTATOR CUFF REPAIR Right 1996  . TONSILLECTOMY    . TOTAL HIP ARTHROPLASTY Left 10/2000  . TOTAL HIP ARTHROPLASTY Right 06/2005  . TOTAL HIP ARTHROPLASTY Left    10/2000  . VAGINAL DELIVERY     x1   Past Surgical History:  Procedure Laterality Date  . ABDOMINAL HYSTERECTOMY  1982   BSO  . ANTERIOR CERVICAL DECOMP/DISCECTOMY FUSION N/A 10/03/2014   Procedure: ANTERIOR CERVICAL DECOMPRESSION/DISCECTOMY FUSION 1 LEVEL,CERVICAL THREE-FOUR;  Surgeon: [name], MD;  Location: [facility] NEURO ORS;  Service: Neurosurgery;  Laterality: N/A;  . APPENDECTOMY  1986   along with chole  . BREAST BIOPSY Right 1997   sclerosing adenosis -Dr. Lucia Gaskins  . CARDIOVERSION N/A 04/29/2019   Procedure: CARDIOVERSION;  Surgeon: Lelon Perla, MD;  Location[facility] ENDOSCOPY;  Service: Cardiovascular;  Laterality: N/A;  . CHOLECYSTECTOMY    . COLONOSCOPY    . ESOPHAGOGASTRODUODENOSCOPY     with dilitation  . LIPOMA EXCISION Left 10/1999   left thigh  . lumbosacral cyst  2005   seen on injection for low back pain   . LUNG BIOPSY  05/2001   /w Dr.Burney- bronchoscopy, mediastinoscopy  . REVERSE SHOULDER ARTHROPLASTY Left 02/01/2016   Procedure: LEFT REVERSE TOTAL SHOULDER ARTHROPLASTY;  Surgeon: [name], MD;  Location: [facility];  Service: Orthopedics;  Laterality: Left;  . rotator cuff  Bilateral  1996/1998   repair  . ROTATOR CUFF REPAIR Left 1996; 1981  . ROTATOR CUFF REPAIR Right 1996  . TONSILLECTOMY    . TOTAL HIP ARTHROPLASTY Left 10/2000  . TOTAL HIP ARTHROPLASTY Right 06/2005  . TOTAL HIP ARTHROPLASTY Left    10/2000  . VAGINAL DELIVERY     x1   Past Medical History:  Diagnosis Date  . Asthmatic bronchitis   . Atrial fibrillation ([place]) 2019  . Cataracts, bilateral    immature  . Chronic back pain    arthritis.Stenosis  . Dysphagia   . Dysrhythmia    (?or extra beats) treated /w atenolol  .  Emphysema lung ([place])    left  . GERD (gastroesophageal reflux disease)    takes Omeprazole daily  . Hard of hearing    wears hearing aides   . History of blood transfusion    no abnormal reaction noted  . History of gastric ulcer   . Hypercholesteremia    takes Atorvastatin daily  . Hypertension    takes Coreg and Losartan daily  . Hypothyroidism    takes Synthroid daily  . IBS (irritable bowel syndrome)    takes Librax daily  . Joint pain   . Lumbosacral spondylosis without myelopathy   . Melanoma ([place])    on left leg, wide excision   . Muscle spasm    takes Robaxin daily as needed  . Neuromuscular disorder ([place])    esophageal spasms at times   . Osteoarthritis   . Peripheral edema    take HCTZ daily  . Pneumonia    hx of.Many yrs ago  . 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 Seasonal allergies    takes Allegra daily and uses Flonase daily as needed  . Shortness of breath dyspnea    with exertion  . Venous stasis   . Yeast infection    given Diflucan today   BP (!) 163/94   Pulse 94   Temp 97.9 F (36.6 C)   Ht 5\' 2"  (1.575 m)   Wt 117 lb (53.1 kg)   LMP 11/03/1980 (Approximate)   SpO2 96%   BMI 21.40 kg/m   Opioid Risk Score:   Fall Risk Score:  `1  Depression screen PHQ 2/9  Depression screen [facility] 2/9 06/14/2020 12/14/2018 11/05/2018 09/13/2018 05/13/2018 04/08/2018 03/10/2018  Decreased Interest 2  0 0 0 0 - 0  Down, Depressed, Hopeless 2 0 0 0 0 - 0  PHQ - 2 Score 4 0 0 0 0 - 0  Altered sleeping - - - - - 0 -  Tired, decreased energy - - - - - - -  Change in appetite - - - - - - -  Feeling bad or failure about yourself  - - - - - - -  Trouble concentrating - - - - - - -  Moving slowly or fidgety/restless - - - - - - -  Suicidal thoughts - - - - - - -  PHQ-9 Score - - - - - - -  Difficult doing work/chores Very difficult - - - - - -  Some recent data might be hidden   Review of Systems  Constitutional: Positive for appetite change and unexpected weight change.       Poor  HENT: Negative.   Eyes: Negative.   Respiratory: Negative.   Cardiovascular: Negative.   Gastrointestinal: Positive for constipation.  Endocrine: Negative.   Genitourinary: Negative.   Musculoskeletal: Positive for back pain.       Shoulder pain  Skin: Negative.   Allergic/Immunologic: Negative.   Neurological: Negative.   Psychiatric/Behavioral: Positive for dysphoric mood. The patient is nervous/anxious.        Objective:   Physical Exam Vitals and nursing note reviewed.  Constitutional:      Appearance: She is normal weight.  HENT:     Head: Normocephalic and atraumatic.  Eyes:     Extraocular Movements: Extraocular movements intact.     Conjunctiva/sclera: Conjunctivae normal.     Pupils: Pupils are equal, round, and reactive to light.  Neurological:  General: No focal deficit present.     Mental Status: She is alert and oriented to person, place, and time.  Psychiatric:        Mood and Affect: Mood normal.        Behavior: Behavior normal.    Motor strength is 5/5 bilateral hip flexor knee extensor ankle dorsiflexor Extremities mild edema dorsum of the left foot and ankle Normal sensation in the right foot as well as the left great toe mildly decreased in the left little toe and lateral border foot. Negative straight leg raising Lumbar range of motion is 100% flexion extension is  25% lateral bending is 50% accompanied by pain during extension and lateral bending but not with flexion       Assessment & Plan:  #1.  Lumbar spondylosis without myelopathy.  She has had some exacerbation of pain more recently he does take oxycodone sparingly approximately 5 mg/day.  She limits her use due to constipation.  Given her age and reduced balance would not encourage higher doses  Given her good results with lumbar medial branch blocks as well as the duration of her usual response would recommend repeating bilateral L3-L4 medial branch and L5 dorsal ramus injections under percent guidance.  Patient is in agreements Rule out some lumbar exercises for her.  Recommended against doing extension exercises, focusing flexion exercises plus core strengthening  #2.  Chronic shoulder pain patient follows up with orthopedics this is not a major complaint at the time

## 2020-06-14 NOTE — Patient Instructions (Signed)

## 2020-07-06 ENCOUNTER — Encounter: Payer: Medicare Other | Attending: Physical Medicine and Rehabilitation | Admitting: Physical Medicine & Rehabilitation

## 2020-07-06 ENCOUNTER — Other Ambulatory Visit: Payer: Self-pay

## 2020-07-06 ENCOUNTER — Encounter: Payer: Self-pay | Admitting: Physical Medicine & Rehabilitation

## 2020-07-06 VITALS — BP 149/78 | HR 69 | Temp 98.7°F | Ht 62.0 in | Wt 117.2 lb

## 2020-07-06 DIAGNOSIS — M47816 Spondylosis without myelopathy or radiculopathy, lumbar region: Secondary | ICD-10-CM | POA: Diagnosis present

## 2020-07-06 DIAGNOSIS — G894 Chronic pain syndrome: Secondary | ICD-10-CM | POA: Insufficient documentation

## 2020-07-06 DIAGNOSIS — Z79891 Long term (current) use of opiate analgesic: Secondary | ICD-10-CM | POA: Diagnosis present

## 2020-07-06 DIAGNOSIS — M47817 Spondylosis without myelopathy or radiculopathy, lumbosacral region: Secondary | ICD-10-CM | POA: Diagnosis not present

## 2020-07-06 DIAGNOSIS — Z79899 Other long term (current) drug therapy: Secondary | ICD-10-CM | POA: Insufficient documentation

## 2020-07-06 DIAGNOSIS — Z5181 Encounter for therapeutic drug level monitoring: Secondary | ICD-10-CM | POA: Insufficient documentation

## 2020-07-06 DIAGNOSIS — M12811 Other specific arthropathies, not elsewhere classified, right shoulder: Secondary | ICD-10-CM | POA: Insufficient documentation

## 2020-07-06 DIAGNOSIS — M12812 Other specific arthropathies, not elsewhere classified, left shoulder: Secondary | ICD-10-CM | POA: Diagnosis present

## 2020-07-06 DIAGNOSIS — M47812 Spondylosis without myelopathy or radiculopathy, cervical region: Secondary | ICD-10-CM | POA: Insufficient documentation

## 2020-07-06 NOTE — Progress Notes (Signed)
Bilateral Lumbar L3, L4  medial branch blocks and L 5 dorsal ramus injection under fluoroscopic guidance  Indication: Lumbar pain which is not relieved by medication management or other conservative care and interfering with self-care and mobility.  Informed consent was obtained after describing risks and benefits of the procedure with the patient, this includes bleeding, infection, paralysis and medication side effects.  The patient wishes to proceed and has given written consent.  The patient was placed in prone position.  The lumbar area was marked and prepped with Betadine.  One mL of 1% lidocaine was injected into each of 6 areas into the skin and subcutaneous tissue.  Then a 22-gauge 3.5in spinal needle was inserted targeting the junction of the left S1 superior articular process and sacral ala junction. Needle was advanced under fluoroscopic guidance.  Bone contact was made.  Isovue 200 was injected x 0.5 mL demonstrating no intravascular uptake.  Then a solution  of 2% MPF lidocaine was injected x 0.5 mL.  Then the left L5 superior articular process in transverse process junction was targeted.  Bone contact was made.  Isovue 200 was injected x 0.5 mL demonstrating no intravascular uptake. Then a solution containing  2% MPF lidocaine was injected x 0.5 mL.  Then the left L4 superior articular process in transverse process junction was targeted.  Bone contact was made.  Isovue 200 was injected x 0.5 mL demonstrating no intravascular uptake.  Then a solution containing2% MPF lidocaine was injected x 0.5 mL.  This same procedure was performed on the right side using the same needle, technique and injectate.  Patient tolerated procedure well.  Post procedure instructions were given.  

## 2020-07-06 NOTE — Progress Notes (Signed)
°  PROCEDURE RECORD [facility]   Name: Jenna [name] DOB:23-Sep-1933 MRN: [id number]  Date:07/06/2020  Physician: Alysia Penna, MD    Nurse/CMA: Jorja Loa MA  Allergies:  Allergies  Allergen Reactions   Codeine Shortness Of Breath   Pseudoephedrine Hcl Shortness Of Breath   Tylenol [Acetaminophen] Shortness Of Breath and Palpitations   Ultram [Tramadol] Shortness Of Breath   Vicodin [Hydrocodone-Acetaminophen] Shortness Of Breath   Dalmane [Flurazepam Hcl] Other (See Comments)    confusion   Nortriptyline     Unknown reaction    Cephalexin Nausea Only   Clarithromycin Nausea Only   Levofloxacin Anxiety   Vibramycin [Doxycycline Calcium] Rash    Consent Signed: Yes.    Is patient diabetic? No.  CBG today? N/A Pregnant: No. LMP: Patient's last menstrual period was 11/03/1980 (approximate). (age 39-55)  Anticoagulants: Eliquis Anti-inflammatory: no Antibiotics: no  Procedure: Bilat L3-L4 medial branch and L5 Position: Prone Start Time: 1:45PM End Time: 1:54PM Fluoro Time:51  RN/CMA Teja Judice MA Chaley Castellanos MA Sareen Randon MA   Time 1:20 2:02 PM 2:35   BP 149/78 176/88 174/86   Pulse 69 65 61   Respirations 14 16    O2 Sat 95 98    S/S 6 6    Pain Level 4/10 4/10     D/C home with SELF [name], D/C instructions reviewed, and sits independently.

## 2020-07-06 NOTE — Patient Instructions (Signed)

## 2020-07-23 ENCOUNTER — Other Ambulatory Visit: Payer: Self-pay | Admitting: Cardiology

## 2020-09-04 ENCOUNTER — Ambulatory Visit: Payer: Medicare Other | Admitting: Physical Medicine & Rehabilitation

## 2020-09-04 ENCOUNTER — Encounter: Payer: Medicare Other | Admitting: Physical Medicine & Rehabilitation

## 2020-09-06 ENCOUNTER — Ambulatory Visit: Payer: Medicare Other | Admitting: Physical Medicine & Rehabilitation

## 2020-09-21 ENCOUNTER — Encounter: Payer: Medicare Other | Attending: Physical Medicine and Rehabilitation | Admitting: Physical Medicine & Rehabilitation

## 2020-09-21 ENCOUNTER — Encounter: Payer: Self-pay | Admitting: Physical Medicine & Rehabilitation

## 2020-09-21 ENCOUNTER — Other Ambulatory Visit: Payer: Self-pay

## 2020-09-21 VITALS — BP 162/90 | HR 70 | Temp 98.3°F | Ht 62.0 in | Wt 118.6 lb

## 2020-09-21 DIAGNOSIS — M47817 Spondylosis without myelopathy or radiculopathy, lumbosacral region: Secondary | ICD-10-CM | POA: Insufficient documentation

## 2020-09-21 NOTE — Progress Notes (Signed)
Subjective:    Patient ID: Jenna [name], female    DOB: January 27, 1933, 86 y.o.   MRN: [id number]  HPI Bilateral L3-4-5 MBB performed on 07/06/2020 helped low back pain >50%, the patient feels that this pain relief has persisted.  Patient has had previous medial branch blocks and had similar duration of response of several months. In terms of her oxycodone she is not taking this every day in fact her last dose was about 3 weeks ago.  Has bilateral shoulder pain, hx Right total shoulder replacement , told by orthopedics that a reverse shoulder will be needed  Occ calf cramping, reviewed MRI of lumbar and cervical spine  Pain Inventory Average Pain 4 Pain Right Now 2 My pain is aching  In the last 24 hours, has pain interfered with the following? General activity 3 Relation with others 1 Enjoyment of life 3 What TIME of day is your pain at its worst? evening Sleep (in general) Good  Pain is worse with: standing Pain improves with: heat/ice and pacing activities Relief from Meds: na  Family History  Problem Relation Age of Onset  . Stroke Mother   . Heart attack Father   . Diabetes Brother   . Heart disease Brother    Social History   Socioeconomic History  . Marital status: Married    Spouse name: Not on file  . Number of children: Not on file  . Years of education: Not on file  . Highest education level: Not on file  Occupational History  . Not on file  Tobacco Use  . Smoking status: Former Smoker    Packs/day: 1.00    Years: 15.00    Pack years: 15.00  . Smokeless tobacco: Never Used  . Tobacco comment: quit smoking in 1985  Vaping Use  . Vaping Use: Never used  Substance and Sexual Activity  . Alcohol use: No  . Drug use: No  . Sexual activity: Not Currently    Partners: Male    Birth control/protection: Surgical    Comment: hysterectomy  Other Topics Concern  . Not on file  Social History Narrative  . Not on file   Social Determinants of Health    Financial Resource Strain:   . Difficulty of Paying Living Expenses: Not on file  Food Insecurity:   . Worried About Charity fundraiser in the Last Year: Not on file  . Ran Out of Food in the Last Year: Not on file  Transportation Needs:   . Lack of Transportation (Medical): Not on file  . Lack of Transportation (Non-Medical): Not on file  Physical Activity:   . Days of Exercise per Week: Not on file  . Minutes of Exercise per Session: Not on file  Stress:   . Feeling of Stress : Not on file  Social Connections:   . Frequency of Communication with Friends and Family: Not on file  . Frequency of Social Gatherings with Friends and Family: Not on file  . Attends Religious Services: Not on file  . Active Member of Clubs or Organizations: Not on file  . Attends Archivist Meetings: Not on file  . Marital Status: Not on file   Past Surgical History:  Procedure Laterality Date  . ABDOMINAL HYSTERECTOMY  1982   BSO  . ANTERIOR CERVICAL DECOMP/DISCECTOMY FUSION N/A 10/03/2014   Procedure: ANTERIOR CERVICAL DECOMPRESSION/DISCECTOMY FUSION 1 LEVEL,CERVICAL THREE-FOUR;  Surgeon: [name], MD;  Location: [place] NEURO ORS;  Service: Neurosurgery;  Laterality:  N/A;  . APPENDECTOMY  1986   along with chole  . BREAST BIOPSY Right 1997   sclerosing adenosis -Dr. Lucia Gaskins  . CARDIOVERSION N/A 04/29/2019   Procedure: CARDIOVERSION;  Surgeon: Lelon Perla, MD;  Location: [facility] ENDOSCOPY;  Service: Cardiovascular;  Laterality: N/A;  . CHOLECYSTECTOMY    . COLONOSCOPY    . ESOPHAGOGASTRODUODENOSCOPY     with dilitation  . LIPOMA EXCISION Left 10/1999   left thigh  . lumbosacral cyst  2005   seen on injection for low back pain   . LUNG BIOPSY  05/2001   /w Dr.Burney- bronchoscopy, mediastinoscopy  . REVERSE SHOULDER ARTHROPLASTY Left 02/01/2016   Procedure: LEFT REVERSE TOTAL SHOULDER ARTHROPLASTY;  Surgeon: [name], MD;  Location: [place];  Service: Orthopedics;  Laterality: Left;   . rotator cuff  Bilateral 1996/1998   repair  . ROTATOR CUFF REPAIR Left 1996; 1981  . ROTATOR CUFF REPAIR Right 1996  . TONSILLECTOMY    . TOTAL HIP ARTHROPLASTY Left 10/2000  . TOTAL HIP ARTHROPLASTY Right 06/2005  . TOTAL HIP ARTHROPLASTY Left    10/2000  . VAGINAL DELIVERY     x1   Past Surgical History:  Procedure Laterality Date  . ABDOMINAL HYSTERECTOMY  1982   BSO  . ANTERIOR CERVICAL DECOMP/DISCECTOMY FUSION N/A 10/03/2014   Procedure: ANTERIOR CERVICAL DECOMPRESSION/DISCECTOMY FUSION 1 LEVEL,CERVICAL THREE-FOUR;  Surgeon: [name], MD;  Location: [place] NEURO ORS;  Service: Neurosurgery;  Laterality: N/A;  . APPENDECTOMY  1986   along with chole  . BREAST BIOPSY Right 1997   sclerosing adenosis -Dr. Lucia Gaskins  . CARDIOVERSION N/A 04/29/2019   Procedure: CARDIOVERSION;  Surgeon: Lelon Perla, MD;  Location: [facility] ENDOSCOPY;  Service: Cardiovascular;  Laterality: N/A;  . CHOLECYSTECTOMY    . COLONOSCOPY    . ESOPHAGOGASTRODUODENOSCOPY     with dilitation  . LIPOMA EXCISION Left 10/1999   left thigh  . lumbosacral cyst  2005   seen on injection for low back pain   . LUNG BIOPSY  05/2001   /w Dr.Burney- bronchoscopy, mediastinoscopy  . REVERSE SHOULDER ARTHROPLASTY Left 02/01/2016   Procedure: LEFT REVERSE TOTAL SHOULDER ARTHROPLASTY;  Surgeon: [name], MD;  Location: [place];  Service: Orthopedics;  Laterality: Left;  . rotator cuff  Bilateral 1996/1998   repair  . ROTATOR CUFF REPAIR Left 1996; 1981  . ROTATOR CUFF REPAIR Right 1996  . TONSILLECTOMY    . TOTAL HIP ARTHROPLASTY Left 10/2000  . TOTAL HIP ARTHROPLASTY Right 06/2005  . TOTAL HIP ARTHROPLASTY Left    10/2000  . VAGINAL DELIVERY     x1   Past Medical History:  Diagnosis Date  . Asthmatic bronchitis   . Atrial fibrillation ([place]) 2019  . Cataracts, bilateral    immature  . Chronic back pain    arthritis.Stenosis  . Dysphagia   . Dysrhythmia    (?or extra beats) treated /w atenolol  . Emphysema  lung ([place])    left  . GERD (gastroesophageal reflux disease)    takes Omeprazole daily  . Hard of hearing    wears hearing aides   . History of blood transfusion    no abnormal reaction noted  . History of gastric ulcer   . Hypercholesteremia    takes Atorvastatin daily  . Hypertension    takes Coreg and Losartan daily  . Hypothyroidism    takes Synthroid daily  . IBS (irritable bowel syndrome)    takes Librax daily  . Joint pain   .  Lumbosacral spondylosis without myelopathy   . Melanoma ([place])    on left leg, wide excision   . Muscle spasm    takes Robaxin daily as needed  . Neuromuscular disorder ([place])    esophageal spasms at times   . Osteoarthritis   . Peripheral edema    take HCTZ daily  . Pneumonia    hx of.Many yrs ago  . 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 Seasonal allergies    takes Allegra daily and uses Flonase daily as needed  . Shortness of breath dyspnea    with exertion  . Venous stasis   . Yeast infection    given Diflucan today   BP (!) 162/90   Pulse 70   Temp 98.3 F (36.8 C)   Ht 5\' 2"  (1.575 m)   Wt 118 lb 9.6 oz (53.8 kg)   LMP 11/03/1980 (Approximate)   SpO2 98%   BMI 21.69 kg/m   Opioid Risk Score:   Fall Risk Score:  `1  Depression screen PHQ 2/9  Depression screen [facility] 2/9 09/21/2020 07/06/2020 06/14/2020 12/14/2018 11/05/2018 09/13/2018 05/13/2018  Decreased Interest 1 0 2 0 0 0 0  Down, Depressed, Hopeless 1 0 2 0 0 0 0  PHQ - 2 Score 2 0 4 0 0 0 0  Altered sleeping - - - - - - -  Tired, decreased energy - - - - - - -  Change in appetite - - - - - - -  Feeling bad or failure about yourself  - - - - - - -  Trouble concentrating - - - - - - -  Moving slowly or fidgety/restless - - - - - - -  Suicidal thoughts - - - - - - -  PHQ-9 Score - - - - - - -  Difficult doing work/chores - - Very difficult - - - -  Some recent data might be hidden   Review of Systems  Constitutional:  Negative.   HENT: Negative.   Eyes: Negative.   Respiratory: Negative.   Cardiovascular: Negative.   Gastrointestinal: Negative.   Endocrine: Negative.   Genitourinary: Negative.   Musculoskeletal: Positive for arthralgias and back pain.       Legs and right shoulder  Skin: Negative.   Allergic/Immunologic: Negative.   Neurological: Negative.   Hematological: Bruises/bleeds easily.       Apixaban  Psychiatric/Behavioral: Negative.   All other systems reviewed and are negative.      Objective:   Physical Exam Vitals and nursing note reviewed.  Constitutional:      Appearance: She is normal weight.  HENT:     Head: Normocephalic and atraumatic.  Eyes:     Extraocular Movements: Extraocular movements intact.     Conjunctiva/sclera: Conjunctivae normal.     Pupils: Pupils are equal, round, and reactive to light.  Musculoskeletal:     Comments: Normal lumbar flexion Limited lumbar extension with some mild pain. No tenderness palpation lumbar or thoracic paraspinal area.  No tenderness over the greater trochanters of the hip  Neurological:     General: No focal deficit present.     Mental Status: She is alert and oriented to person, place, and time.     Gait: Gait normal.     Comments: Motor strength is 5/5 bilateral hip flexor knee extensor ankle dorsiflexor.  Psychiatric:        Mood and Affect: Mood normal.  Behavior: Behavior normal.   Normal strength bilateral upper extremities Hyperreflexic on the left normal reflexes on the right normal reflexes bilateral lower extremities        Assessment & Plan:  #1.  Lumbar spondylosis as well as lumbar spinal stenosis.  She has no signs or symptoms of neurogenic claudication.  Her back pain has been well relieved with the lumbar medial branch blocks 2.  History of bilateral shoulder osteoarthritis.  Status post right total shoulder replacement and in need of a reverse shoulder arthroplasty. 3.  Cervical spondylosis  without myelopathy no signs or symptoms of weakness in the upper extremities. We will continue oxycodone 5 mg as needed she does not take this every day.  She will follow-up with nurse practitioner 2 months If she has recurrence of low back pain we may repeat lumbar medial branch blocks 3.  Cervical spinal stenosis no signs of myelopathy

## 2020-09-21 NOTE — Patient Instructions (Signed)
Please call if another spine injection is needed

## 2020-11-03 DIAGNOSIS — C439 Malignant melanoma of skin, unspecified: Secondary | ICD-10-CM

## 2020-11-03 HISTORY — DX: Malignant melanoma of skin, unspecified: C43.9

## 2020-11-13 ENCOUNTER — Ambulatory Visit: Payer: Medicare Other | Admitting: Registered Nurse

## 2020-11-20 ENCOUNTER — Encounter: Payer: Medicare Other | Admitting: Registered Nurse

## 2020-11-28 ENCOUNTER — Encounter: Payer: Medicare Other | Attending: Physical Medicine and Rehabilitation | Admitting: Registered Nurse

## 2020-11-28 ENCOUNTER — Encounter: Payer: Self-pay | Admitting: Registered Nurse

## 2020-11-28 ENCOUNTER — Other Ambulatory Visit: Payer: Self-pay

## 2020-11-28 VITALS — BP 145/85 | HR 75 | Temp 98.2°F | Ht 62.0 in | Wt 118.8 lb

## 2020-11-28 DIAGNOSIS — M47817 Spondylosis without myelopathy or radiculopathy, lumbosacral region: Secondary | ICD-10-CM | POA: Diagnosis present

## 2020-11-28 DIAGNOSIS — Z79891 Long term (current) use of opiate analgesic: Secondary | ICD-10-CM | POA: Insufficient documentation

## 2020-11-28 DIAGNOSIS — G894 Chronic pain syndrome: Secondary | ICD-10-CM | POA: Insufficient documentation

## 2020-11-28 DIAGNOSIS — G8929 Other chronic pain: Secondary | ICD-10-CM | POA: Diagnosis present

## 2020-11-28 DIAGNOSIS — M25511 Pain in right shoulder: Secondary | ICD-10-CM | POA: Insufficient documentation

## 2020-11-28 DIAGNOSIS — Z5181 Encounter for therapeutic drug level monitoring: Secondary | ICD-10-CM | POA: Diagnosis present

## 2020-11-28 NOTE — Progress Notes (Signed)
Subjective:    Patient ID: Jenna [name], female    DOB: 18-Jul-1933, 87 y.o.   MRN: [id number]  HPI: Jenna [name] is a 87 y.o. female who returns for follow up appointment for chronic pain. She states her pain is located in her right shoulder and lower back pain with long periods of sitting, standing and activity. She rates her pain 3. Her current exercise regime is walking and performing stretching exercises.  Jenna [name] reports her last dose of Oxycodone was 6 weeks ago, she admits to having a glass wine with dinner on 11/27/2020  Jenna [name] Oxycodone was last filled on 04/30/2020 , PMP was Reviewed. Oral Swab was Performed today.    Pain Inventory Average Pain 3 Pain Right Now 3 My pain is intermittent and aching  In the last 24 hours, has pain interfered with the following? General activity 3 Relation with others 0 Enjoyment of life 2 What TIME of day is your pain at its worst? daytime and evening Sleep (in general) Good  Pain is worse with: sitting, standing and some activites Pain improves with: rest, pacing activities and medication Relief from Meds: 5  Family History  Problem Relation Age of Onset  . Stroke Mother   . Heart attack Father   . Diabetes Brother   . Heart disease Brother    Social History   Socioeconomic History  . Marital status: Married    Spouse name: Not on file  . Number of children: Not on file  . Years of education: Not on file  . Highest education level: Not on file  Occupational History  . Not on file  Tobacco Use  . Smoking status: Former Smoker    Packs/day: 1.00    Years: 15.00    Pack years: 15.00  . Smokeless tobacco: Never Used  . Tobacco comment: quit smoking in 1985  Vaping Use  . Vaping Use: Never used  Substance and Sexual Activity  . Alcohol use: No  . Drug use: No  . Sexual activity: Not Currently    Partners: Male    Birth control/protection: Surgical    Comment: hysterectomy  Other Topics Concern  . Not on  file  Social History Narrative  . Not on file   Social Determinants of Health   Financial Resource Strain: Not on file  Food Insecurity: Not on file  Transportation Needs: Not on file  Physical Activity: Not on file  Stress: Not on file  Social Connections: Not on file   Past Surgical History:  Procedure Laterality Date  . ABDOMINAL HYSTERECTOMY  1982   BSO  . ANTERIOR CERVICAL DECOMP/DISCECTOMY FUSION N/A 10/03/2014   Procedure: ANTERIOR CERVICAL DECOMPRESSION/DISCECTOMY FUSION 1 LEVEL,CERVICAL THREE-FOUR;  Surgeon: [name], MD;  Location: [place] NEURO ORS;  Service: Neurosurgery;  Laterality: N/A;  . APPENDECTOMY  1986   along with chole  . BREAST BIOPSY Right 1997   sclerosing adenosis -Dr. Lucia Gaskins  . CARDIOVERSION N/A 04/29/2019   Procedure: CARDIOVERSION;  Surgeon: Lelon Perla, MD;  Location: [facility] ENDOSCOPY;  Service: Cardiovascular;  Laterality: N/A;  . CHOLECYSTECTOMY    . COLONOSCOPY    . ESOPHAGOGASTRODUODENOSCOPY     with dilitation  . LIPOMA EXCISION Left 10/1999   left thigh  . lumbosacral cyst  2005   seen on injection for low back pain   . LUNG BIOPSY  05/2001   /w Dr.Burney- bronchoscopy, mediastinoscopy  . REVERSE SHOULDER ARTHROPLASTY Left 02/01/2016   Procedure: LEFT REVERSE TOTAL  SHOULDER ARTHROPLASTY;  Surgeon: [name], MD;  Location: [place];  Service: Orthopedics;  Laterality: Left;  . rotator cuff  Bilateral 1996/1998   repair  . ROTATOR CUFF REPAIR Left 1996; 1981  . ROTATOR CUFF REPAIR Right 1996  . TONSILLECTOMY    . TOTAL HIP ARTHROPLASTY Left 10/2000  . TOTAL HIP ARTHROPLASTY Right 06/2005  . TOTAL HIP ARTHROPLASTY Left    10/2000  . VAGINAL DELIVERY     x1   Past Surgical History:  Procedure Laterality Date  . ABDOMINAL HYSTERECTOMY  1982   BSO  . ANTERIOR CERVICAL DECOMP/DISCECTOMY FUSION N/A 10/03/2014   Procedure: ANTERIOR CERVICAL DECOMPRESSION/DISCECTOMY FUSION 1 LEVEL,CERVICAL THREE-FOUR;  Surgeon: [name], MD;  Location:  [place] NEURO ORS;  Service: Neurosurgery;  Laterality: N/A;  . APPENDECTOMY  1986   along with chole  . BREAST BIOPSY Right 1997   sclerosing adenosis -Dr. Lucia Gaskins  . CARDIOVERSION N/A 04/29/2019   Procedure: CARDIOVERSION;  Surgeon: Lelon Perla, MD;  [facility] ENDOSCOPY;  Service: Cardiovascular;  Laterality: N/A;  . CHOLECYSTECTOMY    . COLONOSCOPY    . ESOPHAGOGASTRODUODENOSCOPY     with dilitation  . LIPOMA EXCISION Left 10/1999   left thigh  . lumbosacral cyst  2005   seen on injection for low back pain   . LUNG BIOPSY  05/2001   /w Dr.Burney- bronchoscopy, mediastinoscopy  . REVERSE SHOULDER ARTHROPLASTY Left 02/01/2016   Procedure: LEFT REVERSE TOTAL SHOULDER ARTHROPLASTY;  Surgeon: [name], MD;  Location: [place];  Service: Orthopedics;  Laterality: Left;  . rotator cuff  Bilateral 1996/1998   repair  . ROTATOR CUFF REPAIR Left 1996; 1981  . ROTATOR CUFF REPAIR Right 1996  . TONSILLECTOMY    . TOTAL HIP ARTHROPLASTY Left 10/2000  . TOTAL HIP ARTHROPLASTY Right 06/2005  . TOTAL HIP ARTHROPLASTY Left    10/2000  . VAGINAL DELIVERY     x1   Past Medical History:  Diagnosis Date  . Asthmatic bronchitis   . Atrial fibrillation ([place]) 2019  . Cataracts, bilateral    immature  . Chronic back pain    arthritis.Stenosis  . Dysphagia   . Dysrhythmia    (?or extra beats) treated /w atenolol  . Emphysema lung ([place])    left  . GERD (gastroesophageal reflux disease)    takes Omeprazole daily  . Hard of hearing    wears hearing aides   . History of blood transfusion    no abnormal reaction noted  . History of gastric ulcer   . Hypercholesteremia    takes Atorvastatin daily  . Hypertension    takes Coreg and Losartan daily  . Hypothyroidism    takes Synthroid daily  . IBS (irritable bowel syndrome)    takes Librax daily  . Joint pain   . Lumbosacral spondylosis without myelopathy   . Melanoma ([place])    on left leg, wide excision   . Muscle spasm    takes  Robaxin daily as needed  . Neuromuscular disorder ([place])    esophageal spasms at times   . Osteoarthritis   . Peripheral edema    take HCTZ daily  . Pneumonia    hx of.Many yrs ago  . 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 Seasonal allergies    takes Allegra daily and uses Flonase daily as needed  . Shortness of breath dyspnea    with exertion  . Venous stasis   .  Yeast infection    given Diflucan today   BP (!) 145/85   Pulse 75   Temp 98.2 F (36.8 C)   Ht 5\' 2"  (1.575 m)   Wt 118 lb 12.8 oz (53.9 kg)   LMP 11/03/1980 (Approximate)   SpO2 98%   BMI 21.73 kg/m   Opioid Risk Score:   Fall Risk Score:  `1  Depression screen PHQ 2/9  Depression screen [facility] 2/9 09/21/2020 07/06/2020 06/14/2020 12/14/2018 11/05/2018 09/13/2018 05/13/2018  Decreased Interest 1 0 2 0 0 0 0  Down, Depressed, Hopeless 1 0 2 0 0 0 0  PHQ - 2 Score 2 0 4 0 0 0 0  Altered sleeping - - - - - - -  Tired, decreased energy - - - - - - -  Change in appetite - - - - - - -  Feeling bad or failure about yourself  - - - - - - -  Trouble concentrating - - - - - - -  Moving slowly or fidgety/restless - - - - - - -  Suicidal thoughts - - - - - - -  PHQ-9 Score - - - - - - -  Difficult doing work/chores - - Very difficult - - - -  Some recent data might be hidden   Review of Systems  Musculoskeletal: Positive for back pain.  All other systems reviewed and are negative.      Objective:   Physical Exam Vitals and nursing note reviewed.  Constitutional:      Appearance: Normal appearance.  Cardiovascular:     Rate and Rhythm: Normal rate and regular rhythm.     Pulses: Normal pulses.     Heart sounds: Normal heart sounds.  Pulmonary:     Effort: Pulmonary effort is normal.     Breath sounds: Normal breath sounds.  Musculoskeletal:     Cervical back: Normal range of motion and neck supple.     Comments: Normal Muscle Bulk and Muscle Testing Reveals:   Upper Extremities: Full ROM and Muscle Strength 5/5  Lower Extremities: Full ROM and Muscle Strength 5/5 Arises from chair with ease Narrow Based  Gait   Skin:    General: Skin is warm and dry.  Neurological:     Mental Status: She is alert and oriented to person, place, and time.  Psychiatric:        Mood and Affect: Mood normal.        Behavior: Behavior normal.           Assessment & Plan:  1.  Lumbar spondylosis as well as lumbar spinal stenosis.  She has no signs or symptoms of neurogenic claudication.  Her back pain has been well relieved with the lumbar medial branch blocks. 11/28/2020 2.  History of bilateral shoulder osteoarthritis.  Status post right total shoulder replacement and in need of a reverse shoulder arthroplasty.11/28/2020 3.  Cervical spondylosis without myelopathy no signs or symptoms of weakness in the upper extremities. We will continue oxycodone 5 mg as needed . We will continue the opioid monitoring program, this consists of regular clinic visits, examinations, urine drug screen, pill counts as well as use of [place] Controlled Substance Reporting system. A 12 month History has been reviewed on the [place] Controlled Substance Reporting System on 11/28/2020  3.  Cervical spinal stenosis no signs of myelopathy. Continue to monitor. 11/28/2020. Continue to monitor.   F/U in 6 months

## 2020-12-03 LAB — DRUG TOX MONITOR 1 W/CONF, ORAL FLD
Alprazolam: NEGATIVE ng/mL (ref <0.50)
Amphetamines: NEGATIVE ng/mL (ref <10)
Barbiturates: NEGATIVE ng/mL (ref <10)
Benzodiazepines: POSITIVE ng/mL — AB (ref <0.50)
Buprenorphine: NEGATIVE ng/mL (ref <0.10)
Chlordiazepoxide: NEGATIVE ng/mL (ref <0.50)
Clonazepam: NEGATIVE ng/mL (ref <0.50)
Cocaine: NEGATIVE ng/mL (ref <5.0)
Diazepam: 1.53 ng/mL — ABNORMAL HIGH (ref <0.50)
Fentanyl: NEGATIVE ng/mL (ref <0.10)
Flunitrazepam: NEGATIVE ng/mL (ref <0.50)
Flurazepam: NEGATIVE ng/mL (ref <0.50)
Heroin Metabolite: NEGATIVE ng/mL (ref <1.0)
Lorazepam: NEGATIVE ng/mL (ref <0.50)
MARIJUANA: NEGATIVE ng/mL (ref <2.5)
MDMA: NEGATIVE ng/mL (ref <10)
Meprobamate: NEGATIVE ng/mL (ref <2.5)
Methadone: NEGATIVE ng/mL (ref <5.0)
Midazolam: NEGATIVE ng/mL (ref <0.50)
Nicotine Metabolite: NEGATIVE ng/mL (ref <5.0)
Nordiazepam: 5.02 ng/mL — ABNORMAL HIGH (ref <0.50)
Opiates: NEGATIVE ng/mL (ref <2.5)
Oxazepam: NEGATIVE ng/mL (ref <0.50)
Phencyclidine: NEGATIVE ng/mL (ref <10)
Tapentadol: NEGATIVE ng/mL (ref <5.0)
Temazepam: NEGATIVE ng/mL (ref <0.50)
Tramadol: NEGATIVE ng/mL (ref <5.0)
Triazolam: NEGATIVE ng/mL (ref <0.50)
Zolpidem: NEGATIVE ng/mL (ref <5.0)

## 2020-12-03 LAB — DRUG TOX ALC METAB W/CON, ORAL FLD: Alcohol Metabolite: NEGATIVE ng/mL (ref <25)

## 2020-12-10 ENCOUNTER — Telehealth: Payer: Self-pay | Admitting: *Deleted

## 2020-12-10 NOTE — Telephone Encounter (Signed)
Oral swab drug screen was consistent for prescribed medications.  ?

## 2021-02-14 ENCOUNTER — Encounter: Payer: Medicare Other | Admitting: Registered Nurse

## 2021-02-26 ENCOUNTER — Other Ambulatory Visit: Payer: Self-pay | Admitting: Cardiology

## 2021-02-26 NOTE — Telephone Encounter (Signed)
11f, 53.9 kg, Creatinine, Serum 0.640 mg/ 12/19/2019, lovw/meng 05/23/20

## 2021-02-26 NOTE — Progress Notes (Signed)
GYNECOLOGY  VISIT   HPI: 87 y.o.   Married  Caucasian  female   G1P1001 with Patient's last menstrual period was 11/03/1980 (approximate).   here for breast and pelvic exam.  Had a melanoma excision.   Using Replens.    May want to see her GI about RUQ pain.  She has gas and constipation.  Some abdominal discomfort.  Not eating a lot or protein. Uses Metamucil and Colace. Normal MRI abdomen in 11/2019.  Has some numbness and itching in her feet.  She will see her PCP.   Recent bronchitis.   Received her Covid booster.   GYNECOLOGIC HISTORY: Patient's last menstrual period was 11/03/1980 (approximate). Contraception:  Hyst Menopausal hormone therapy:  none Last mammogram: 06-07-20 3D/Neg/BiRads1 Last pap smear:   2002 Neg BMD:  05/19/17:  Normal, [facility].        OB History    Gravida  1   Para  1   Term  1   Preterm  0   AB  0   Living  1     SAB  0   IAB  0   Ectopic  0   Multiple  0   Live Births  1              Patient Active Problem List   Diagnosis Date Noted  . Medication side effects 08/25/2019  . Fatigue 06/01/2019  . PAF (paroxysmal atrial fibrillation) ([place]) 05/12/2019  . Chronic anticoagulation 05/12/2019  . Essential hypertension 05/12/2019  . Hypothyroidism 05/12/2019  . Lumbosacral spondylosis without myelopathy 01/23/2017  . S/P shoulder replacement 02/01/2016  . Cervical spondylosis with myelopathy 10/03/2014  . Spondylosis of cervical joint 07/07/2012  . Chronic pain 02/10/2012  . Rotator cuff tear arthropathy of both shoulders 02/10/2012  . Lumbar spondylosis 02/10/2012    Past Medical History:  Diagnosis Date  . Asthmatic bronchitis   . Atrial fibrillation ([place]) 2019  . Cataracts, bilateral    immature  . Chronic back pain    arthritis.Stenosis  . Dysphagia   . Dysrhythmia    (?or extra beats) treated /w atenolol  . Emphysema lung ([place])    left  . GERD (gastroesophageal reflux disease)    takes Omeprazole daily   . Hard of hearing    wears hearing aides   . History of blood transfusion    no abnormal reaction noted  . History of gastric ulcer   . Hypercholesteremia    takes Atorvastatin daily  . Hypertension    takes Coreg and Losartan daily  . Hypothyroidism    takes Synthroid daily  . IBS (irritable bowel syndrome)    takes Librax daily  . Joint pain   . Lumbosacral spondylosis without myelopathy   . Melanoma ([place])    on left leg, wide excision   . Melanoma ([place]) 2022   abdomen  . Muscle spasm    takes Robaxin daily as needed  . Neuromuscular disorder ([place])    esophageal spasms at times   . Osteoarthritis   . Peripheral edema    take HCTZ daily  . Pneumonia    hx of.Many yrs ago  . 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 Seasonal allergies    takes Allegra daily and uses Flonase daily as needed  . Shortness of breath dyspnea    with exertion  . Venous stasis   . Yeast infection    given Diflucan today  Past Surgical History:  Procedure Laterality Date  . ABDOMINAL HYSTERECTOMY  1982   BSO  . ANTERIOR CERVICAL DECOMP/DISCECTOMY FUSION N/A 10/03/2014   Procedure: ANTERIOR CERVICAL DECOMPRESSION/DISCECTOMY FUSION 1 LEVEL,CERVICAL THREE-FOUR;  Surgeon: [name], MD;  Location: [facility];  Service: Neurosurgery;  Laterality: N/A;  . APPENDECTOMY  1986   along with chole  . BREAST BIOPSY Right 1997   sclerosing adenosis -Dr. Lucia Gaskins  . CARDIOVERSION N/A 04/29/2019   Procedure: CARDIOVERSION;  Surgeon: Lelon Perla, MD;  Location: [facility] ENDOSCOPY;  Service: Cardiovascular;  Laterality: N/A;  . CHOLECYSTECTOMY    . COLONOSCOPY    . ESOPHAGOGASTRODUODENOSCOPY     with dilitation  . LIPOMA EXCISION Left 10/1999   left thigh  . lumbosacral cyst  2005   seen on injection for low back pain   . LUNG BIOPSY  05/2001   /w Dr.Burney- bronchoscopy, mediastinoscopy  . MELANOMA EXCISION    . REVERSE SHOULDER ARTHROPLASTY Left  02/01/2016   Procedure: LEFT REVERSE TOTAL SHOULDER ARTHROPLASTY;  Surgeon: [name], MD;  Location: [facility];  Service: Orthopedics;  Laterality: Left;  . rotator cuff  Bilateral 1996/1998   repair  . ROTATOR CUFF REPAIR Left 1996; 1981  . ROTATOR CUFF REPAIR Right 1996  . TONSILLECTOMY    . TOTAL HIP ARTHROPLASTY Left 10/2000  . TOTAL HIP ARTHROPLASTY Right 06/2005  . TOTAL HIP ARTHROPLASTY Left    10/2000  . VAGINAL DELIVERY     x1    Current Outpatient Medications  Medication Sig Dispense Refill  . acetaminophen (TYLENOL) 325 MG tablet (500mg ) 1/2 tab    . albuterol (PROVENTIL HFA;VENTOLIN HFA) 108 (90 Base) MCG/ACT inhaler Inhale 2 puffs into the lungs every 6 (six) hours as needed for wheezing or shortness of breath.    [name] atenolol (TENORMIN) 25 MG tablet 1 tablet    . Calcium Carbonate-Vitamin D (CALCIUM 600+D PO) Take 1 tablet by mouth daily.    . carvedilol (COREG) 6.25 MG tablet Take 1 tablet (6.25 mg total) by mouth 2 (two) times daily with a meal. 180 tablet 0  . cholecalciferol (VITAMIN D) 1000 UNITS tablet Take 1,000 Units by mouth daily.    . clidinium-chlordiazePOXIDE (LIBRAX) 5-2.5 MG capsule 1 capsule before meals    . diazepam (VALIUM) 5 MG tablet Take 2.5 mg by mouth at bedtime as needed for sedation.     . docusate sodium (COLACE) 100 MG capsule 3 capsules    . Eflornithine HCl (VANIQA) 13.9 % cream Vaniqa 13.9 % topical cream  APPLY TO AFFECTED AREA TWICE A DAY AS DIRECTED    . ELIQUIS 2.5 MG TABS tablet TAKE 1 TABLET BY MOUTH 2 TIMES DAILY. 180 tablet 1  . fluticasone (FLONASE) 50 MCG/ACT nasal spray Place 2 sprays into both nostrils daily.    . furosemide (LASIX) 20 MG tablet Take 20 mg by mouth as needed.     . hyoscyamine (LEVSIN SL) 0.125 MG SL tablet Place 0.125 mg under the tongue every 4 (four) hours as needed for cramping.     [name] ketoconazole (NIZORAL) 2 % shampoo     . levothyroxine (SYNTHROID, LEVOTHROID) 112 MCG tablet Take 112 mcg by mouth daily before  breakfast.    . losartan (COZAAR) 100 MG tablet Take 1 tablet (100 mg total) by mouth daily. 30 tablet 0  . naphazoline-pheniramine (NAPHCON-A) 0.025-0.3 % ophthalmic solution Place 1 drop into both eyes 4 (four) times daily as needed for eye irritation.    [name]  omeprazole (PRILOSEC) 40 MG capsule Take 40 mg by mouth at bedtime.     . triamcinolone cream (KENALOG) 0.1 % USE AS DIRECTED    . triamterene-hydrochlorothiazide (MAXZIDE) 75-50 MG tablet 1 tablet in the morning    . atorvastatin (LIPITOR) 20 MG tablet Take 20 mg by mouth daily. (Patient not taking: Reported on 02/27/2021)    . sertraline (ZOLOFT) 50 MG tablet Take 50 mg by mouth daily. (Patient not taking: Reported on 02/27/2021)     No current facility-administered medications for this visit.     ALLERGIES: Codeine, Pseudoephedrine hcl, Tylenol [acetaminophen], Ultram [tramadol], Vicodin [hydrocodone-acetaminophen], Dalmane [flurazepam hcl], Flurazepam, Nortriptyline, Other, Propoxyphene, Cephalexin, Clarithromycin, Levofloxacin, and Vibramycin [doxycycline calcium]  Family History  Problem Relation Age of Onset  . Stroke Mother   . Heart attack Father   . Diabetes Brother   . Heart disease Brother     Social History   Socioeconomic History  . Marital status: Married    Spouse name: Not on file  . Number of children: Not on file  . Years of education: Not on file  . Highest education level: Not on file  Occupational History  . Not on file  Tobacco Use  . Smoking status: Former Smoker    Packs/day: 1.00    Years: 15.00    Pack years: 15.00  . Smokeless tobacco: Never Used  . Tobacco comment: quit smoking in 1985  Vaping Use  . Vaping Use: Never used  Substance and Sexual Activity  . Alcohol use: Yes    Alcohol/week: 3.0 standard drinks    Types: 3 Glasses of wine per week  . Drug use: No  . Sexual activity: Not Currently    Partners: Male    Birth control/protection: Surgical    Comment: hysterectomy  Other  Topics Concern  . Not on file  Social History Narrative  . Not on file   Social Determinants of Health   Financial Resource Strain: Not on file  Food Insecurity: Not on file  Transportation Needs: Not on file  Physical Activity: Not on file  Stress: Not on file  Social Connections: Not on file  Intimate Partner Violence: Not on file    Review of Systems  All other systems reviewed and are negative.   PHYSICAL EXAMINATION:    BP (!) 148/82   Pulse 77   Ht 5\' 1"  (1.549 m)   Wt 120 lb (54.4 kg)   LMP 11/03/1980 (Approximate)   SpO2 96%   BMI 22.67 kg/m     General appearance: alert, cooperative and appears stated age Head: Normocephalic, without obvious abnormality, atraumatic Neck: no adenopathy, supple, symmetrical, trachea midline and thyroid normal to inspection and palpation Lungs: clear to auscultation right sided. Rales of left base.  Breasts: normal appearance, no masses or tenderness, No nipple retraction or dimpling, No nipple discharge or bleeding, No axillary or supraclavicular adenopathy Heart: regular rate and rhythm Abdomen: soft, non-tender, no masses,  no organomegaly Extremities: extremities normal, atraumatic, no cyanosis or edema Skin: Skin color, texture, turgor normal. No rashes or lesions Lymph nodes: Cervical, supraclavicular, and axillary nodes normal. No abnormal inguinal nodes palpated Neurologic: Grossly normal  Pelvic: External genitalia:  no lesions              Urethra:  normal appearing urethra with no masses, tenderness or lesions              Bartholins and Skenes: normal  Vagina: normal appearing vagina with normal color and erythema of the mucosa. No lesions.              Cervix: absent                Bimanual Exam:  Uterus:  absent              Adnexa: no mass, fullness, tenderness              Rectal exam: Yes.  .  Confirms.              Anus:  normal sphincter tone, no lesions  Chaperone was present for  exam.  ASSESSMENT  Status post TAH/BSO. Pelvic exam with abnormal finding present.  Vaginal atrophy.  Screening breast exam. Atrial fibrillation.  On Eliquis.  Vaccine counseling.   PLAN  Continue Replens.  Consider cooking oil for the vagina.  Screening mammogram in August.  BMD in 2027. Physical activity encouraged and intake of more protein.  We discussed second booster for Covid.  Fu in 2 years and prn.   29 min  total time was spent for this patient encounter, including preparation, face-to-face counseling with the patient, coordination of care, and documentation of the encounter.

## 2021-02-27 ENCOUNTER — Encounter: Payer: Self-pay | Admitting: Obstetrics and Gynecology

## 2021-02-27 ENCOUNTER — Other Ambulatory Visit: Payer: Self-pay

## 2021-02-27 ENCOUNTER — Ambulatory Visit (INDEPENDENT_AMBULATORY_CARE_PROVIDER_SITE_OTHER): Payer: Medicare Other | Admitting: Obstetrics and Gynecology

## 2021-02-27 VITALS — BP 148/82 | HR 77 | Ht 61.0 in | Wt 120.0 lb

## 2021-02-27 DIAGNOSIS — Z01411 Encounter for gynecological examination (general) (routine) with abnormal findings: Secondary | ICD-10-CM

## 2021-02-27 DIAGNOSIS — Z7185 Encounter for immunization safety counseling: Secondary | ICD-10-CM

## 2021-02-27 DIAGNOSIS — Z1239 Encounter for other screening for malignant neoplasm of breast: Secondary | ICD-10-CM

## 2021-02-27 DIAGNOSIS — N952 Postmenopausal atrophic vaginitis: Secondary | ICD-10-CM | POA: Diagnosis not present

## 2021-02-27 NOTE — Patient Instructions (Signed)

## 2021-03-09 NOTE — Progress Notes (Deleted)
Cardiology Office Note:    Date:  03/09/2021   ID:  Jenna [name], DOB 03-18-1933, MRN [id number]  PCP:  [name], MD  [facility] Cardiologist:  [name], MD  [place] Electrophysiologist:  None   Referring MD: [name], MD   No chief complaint on file.   History of Present Illness:    Jenna [name] is a 87 y.o. female with a hx of PAF, GERD, hypertension, hyperlipidemia, and hypothyroidism.  Patient was first diagnosed with atrial fibrillation in January 2020.  However she suspected her A. fib may have dated back to September 2019 when she first noticed fatigue.  She was started on Eliquis 2.5 mg twice a day given her age and BMI.  Echocardiogram performed on January 12, 2019 showed EF 45 to 50% with moderate LAE.  She was initially supposed to undergo cardioversion, this was delayed due to onset of COVID-19 pandemic.  She ultimately underwent DCCV on 04/29/2019.  She had recurrence in July 2020, amiodarone was added. She subsequently converted back to sinus rhythm on amiodarone therapy.  Due to side effect, amiodarone was initially reduced and then eventually discontinued in October 2020.  When seen in July 2021 she was doing well.  EKG continued to show normal sinus rhythm.  According to the patient, her son had similar course like her when it comes to atrial fibrillation and tachycardia mediated cardiomyopathy.  They are on similar medications with exception of the fact that her son was able to tolerate amiodarone and she could not.  Overall, she has been doing quite well and can follow-up in 6 months.   Past Medical History:  Diagnosis Date  . Asthmatic bronchitis   . Atrial fibrillation ([place]) 2019  . Cataracts, bilateral    immature  . Chronic back pain    arthritis.Stenosis  . Dysphagia   . Dysrhythmia    (?or extra beats) treated /w atenolol  . Emphysema lung ([place])    left  . GERD (gastroesophageal reflux disease)    takes Omeprazole daily  .  Hard of hearing    wears hearing aides   . History of blood transfusion    no abnormal reaction noted  . History of gastric ulcer   . Hypercholesteremia    takes Atorvastatin daily  . Hypertension    takes Coreg and Losartan daily  . Hypothyroidism    takes Synthroid daily  . IBS (irritable bowel syndrome)    takes Librax daily  . Joint pain   . Lumbosacral spondylosis without myelopathy   . Melanoma ([name])    on left leg, wide excision   . Melanoma ([name]) 2022   abdomen  . Muscle spasm    takes Robaxin daily as needed  . Neuromuscular disorder ([place])    esophageal spasms at times   . Osteoarthritis   . Peripheral edema    take HCTZ daily  . Pneumonia    hx of.Many yrs ago  . 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 Seasonal allergies    takes Allegra daily and uses Flonase daily as needed  . Shortness of breath dyspnea    with exertion  . Venous stasis   . Yeast infection    given Diflucan today    Past Surgical History:  Procedure Laterality Date  . ABDOMINAL HYSTERECTOMY  1982   BSO  . ANTERIOR CERVICAL DECOMP/DISCECTOMY FUSION N/A 10/03/2014   Procedure: ANTERIOR CERVICAL DECOMPRESSION/DISCECTOMY FUSION  1 LEVEL,CERVICAL THREE-FOUR;  Surgeon: [name], MD;  Location: [facility]S;  Service: Neurosurgery;  Laterality: N/A;  . APPENDECTOMY  1986   along with chole  . BREAST BIOPSY Right 1997   sclerosing adenosis -Dr. Lucia Gaskins  . CARDIOVERSION N/A 04/29/2019   Procedure: CARDIOVERSION;  Surgeon: Lelon Perla, MD;  Location: [facility] ENDOSCOPY;  Service: Cardiovascular;  Laterality: N/A;  . CHOLECYSTECTOMY    . COLONOSCOPY    . ESOPHAGOGASTRODUODENOSCOPY     with dilitation  . LIPOMA EXCISION Left 10/1999   left thigh  . lumbosacral cyst  2005   seen on injection for low back pain   . LUNG BIOPSY  05/2001   /w Dr.Burney- bronchoscopy, mediastinoscopy  . MELANOMA EXCISION    . REVERSE SHOULDER ARTHROPLASTY Left  02/01/2016   Procedure: LEFT REVERSE TOTAL SHOULDER ARTHROPLASTY;  Surgeon: [name], MD;  Location: [facility];  Service: Orthopedics;  Laterality: Left;  . rotator cuff  Bilateral 1996/1998   repair  . ROTATOR CUFF REPAIR Left 1996; 1981  . ROTATOR CUFF REPAIR Right 1996  . TONSILLECTOMY    . TOTAL HIP ARTHROPLASTY Left 10/2000  . TOTAL HIP ARTHROPLASTY Right 06/2005  . TOTAL HIP ARTHROPLASTY Left    10/2000  . VAGINAL DELIVERY     x1    Current Medications: No outpatient medications have been marked as taking for the 03/14/21 encounter (Appointment) with Martinique, Sherra Kimmons M, MD.     Allergies:   Codeine, Pseudoephedrine hcl, Tylenol [acetaminophen], Ultram [tramadol], Vicodin [hydrocodone-acetaminophen], Dalmane [flurazepam hcl], Flurazepam, Nortriptyline, Other, Propoxyphene, Cephalexin, Clarithromycin, Levofloxacin, and Vibramycin [doxycycline calcium]   Social History   Socioeconomic History  . Marital status: Married    Spouse name: Not on file  . Number of children: Not on file  . Years of education: Not on file  . Highest education level: Not on file  Occupational History  . Not on file  Tobacco Use  . Smoking status: Former Smoker    Packs/day: 1.00    Years: 15.00    Pack years: 15.00  . Smokeless tobacco: Never Used  . Tobacco comment: quit smoking in 1985  Vaping Use  . Vaping Use: Never used  Substance and Sexual Activity  . Alcohol use: Yes    Alcohol/week: 3.0 standard drinks    Types: 3 Glasses of wine per week  . Drug use: No  . Sexual activity: Not Currently    Partners: Male    Birth control/protection: Surgical    Comment: hysterectomy  Other Topics Concern  . Not on file  Social History Narrative  . Not on file   Social Determinants of Health   Financial Resource Strain: Not on file  Food Insecurity: Not on file  Transportation Needs: Not on file  Physical Activity: Not on file  Stress: Not on file  Social Connections: Not on file      Family History: The patient's family history includes Diabetes in her brother; Heart attack in her father; Heart disease in her brother; Stroke in her mother.  ROS:   Please see the history of present illness.     All other systems reviewed and are negative.  EKGs/Labs/Other Studies Reviewed:    The following studies were reviewed today:  Echo 01/12/2019 1. The left ventricle has mildly reduced systolic function, with an  ejection fraction of 45-50%. The cavity size was normal. Left ventricular  diastolic function could not be evaluated secondary to atrial  fibrillation. Left ventrical global hypokinesis  without regional wall motion abnormalities.  2. The right ventricle has normal systolic function. The cavity was  normal. There is no increase in right ventricular wall thickness.  3. Left atrial size was moderately dilated.  4. The aortic valve is tricuspid Moderate thickening of the aortic valve  Moderate calcification of the aortic valve. Aortic valve regurgitation was  not assessed by color flow Doppler.  EKG:  EKG is ordered today.  The ekg ordered today demonstrates normal sinus rhythm, no significant ST-T wave changes.  Recent Labs: No results found for requested labs within last 8760 hours.  Recent Lipid Panel No results found for: CHOL, TRIG, HDL, CHOLHDL, VLDL, LDLCALC, LDLDIRECT  Physical Exam:    VS:  LMP 11/03/1980 (Approximate)     Wt Readings from Last 3 Encounters:  02/27/21 120 lb (54.4 kg)  11/28/20 118 lb 12.8 oz (53.9 kg)  09/21/20 118 lb 9.6 oz (53.8 kg)     GEN:  Well nourished, well developed in no acute distress HEENT: Normal NECK: No JVD; No carotid bruits LYMPHATICS: No lymphadenopathy CARDIAC: RRR, no murmurs, rubs, gallops RESPIRATORY:  Clear to auscultation without rales, wheezing or rhonchi  ABDOMEN: Soft, non-tender, non-distended MUSCULOSKELETAL:  No edema; No deformity  SKIN: Warm and dry NEUROLOGIC:  Alert and oriented x  3 PSYCHIATRIC:  Normal affect   ASSESSMENT:    No diagnosis found. PLAN:    In order of problems listed above:  1. PAF: She is maintaining sinus rhythm.  Continue Eliquis.  She is intolerant of amiodarone therapy  2. Hypertension: Blood pressure stable  3. Hyperlipidemia: On Lipitor  4. Hypothyroidism: Continue levothyroxine  5. 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  Ejection fraction 45 to 50% on last echocardiogram in March 2020.  She is euvolemic on exam.   Medication Adjustments/Labs and Tests Ordered: Current medicines are reviewed at length with the patient today.  Concerns regarding medicines are outlined above.  No orders of the defined types were placed in this encounter.  No orders of the defined types were placed in this encounter.   There are no Patient Instructions on file for this visit.   Signed, Datron Brakebill Martinique, MD  03/09/2021 11:13 AM    [place]

## 2021-03-14 ENCOUNTER — Encounter: Payer: Self-pay | Admitting: Physical Medicine & Rehabilitation

## 2021-03-14 ENCOUNTER — Other Ambulatory Visit: Payer: Self-pay

## 2021-03-14 ENCOUNTER — Ambulatory Visit: Payer: Medicare Other | Admitting: Cardiology

## 2021-03-14 ENCOUNTER — Encounter: Payer: Medicare Other | Attending: Physical Medicine & Rehabilitation | Admitting: Physical Medicine & Rehabilitation

## 2021-03-14 VITALS — BP 157/89 | HR 82 | Temp 97.8°F | Ht 61.0 in | Wt 120.0 lb

## 2021-03-14 DIAGNOSIS — M48061 Spinal stenosis, lumbar region without neurogenic claudication: Secondary | ICD-10-CM | POA: Insufficient documentation

## 2021-03-14 DIAGNOSIS — M47817 Spondylosis without myelopathy or radiculopathy, lumbosacral region: Secondary | ICD-10-CM | POA: Insufficient documentation

## 2021-03-14 NOTE — Patient Instructions (Signed)
I think your leg pain and numbness is likely due to lumbar spinal stenosis, would recommend followup with Dr Ellene Route  THe hand symptoms may be related to cervical spinal stenosis

## 2021-03-14 NOTE — Progress Notes (Signed)
Subjective:    Patient ID: Jenna [name], female    DOB: [birth year], 87 y.o.   MRN: [id number]  HPI  CC:  Low and mid back pain 87 year old female followed in this clinic for approximately 10 years with primary chronic pain complaints of low back pain as well as shoulder pain.  The patient's main new complaint is increasing lower extremity numbness as well as burning pain.  This is mainly at night.  She notices when she rolls over sometimes she is awakened by fairly sharp pain more in the left lower extremity than right lower extremity.  She has had no recent falls or trauma.  No recent infections or hospitalizations.  No bowel or bladder symptoms. Her last MRI was performed in 2011 and demonstrated severe central stenosis at L4-5 as well as bilateral foraminal stenosis L4-5 L5-S1.  Bilateral MBB L3-4-5 07/06/2020 lasted until ~April 2022 Neck pain hx cervical fusion was seen by Dr Ellene Route  No longer taking Oxycodone, currently takes tylenol but it does make her jittery  Pain Inventory Average Pain 5 Pain Right Now 5 My pain is intermittent, burning, tingling and aching  In the last 24 hours, has pain interfered with the following? General activity 4 Relation with others 1 Enjoyment of life 5 What TIME of day is your pain at its worst? evening and night Sleep (in general) Poor  Pain is worse with: walking, standing and some activites Pain improves with: rest, pacing activities, medication and injections Relief from Meds: 6  Family History  Problem Relation Age of Onset  . Stroke Mother   . Heart attack Father   . Diabetes Brother   . Heart disease Brother    Social History   Socioeconomic History  . Marital status: Married    Spouse name: Not on file  . Number of children: Not on file  . Years of education: Not on file  . Highest education level: Not on file  Occupational History  . Not on file  Tobacco Use  . Smoking status: Former Smoker    Packs/day: 1.00     Years: 15.00    Pack years: 15.00  . Smokeless tobacco: Never Used  . Tobacco comment: quit smoking in 1985  Vaping Use  . Vaping Use: Never used  Substance and Sexual Activity  . Alcohol use: Yes    Alcohol/week: 3.0 standard drinks    Types: 3 Glasses of wine per week  . Drug use: No  . Sexual activity: Not Currently    Partners: Male    Birth control/protection: Surgical    Comment: hysterectomy  Other Topics Concern  . Not on file  Social History Narrative  . Not on file   Social Determinants of Health   Financial Resource Strain: Not on file  Food Insecurity: Not on file  Transportation Needs: Not on file  Physical Activity: Not on file  Stress: Not on file  Social Connections: Not on file   Past Surgical History:  Procedure Laterality Date  . ABDOMINAL HYSTERECTOMY  1982   BSO  . ANTERIOR CERVICAL DECOMP/DISCECTOMY FUSION N/A 10/03/2014   Procedure: ANTERIOR CERVICAL DECOMPRESSION/DISCECTOMY FUSION 1 LEVEL,CERVICAL THREE-FOUR;  Surgeon: [name], MD;  Location: [place] [facility];  Service: Neurosurgery;  Laterality: N/A;  . APPENDECTOMY  1986   along with chole  . BREAST BIOPSY Right 1997   sclerosing adenosis -Dr. Lucia Gaskins  . CARDIOVERSION N/A 04/29/2019   Procedure: CARDIOVERSION;  Surgeon: Lelon Perla, MD;  Location: [facility]  ENDOSCOPY;  Service: Cardiovascular;  Laterality: N/A;  . CHOLECYSTECTOMY    . COLONOSCOPY    . ESOPHAGOGASTRODUODENOSCOPY     with dilitation  . LIPOMA EXCISION Left 10/1999   left thigh  . lumbosacral cyst  2005   seen on injection for low back pain   . LUNG BIOPSY  05/2001   /w Dr.Burney- bronchoscopy, mediastinoscopy  . MELANOMA EXCISION    . REVERSE SHOULDER ARTHROPLASTY Left 02/01/2016   Procedure: LEFT REVERSE TOTAL SHOULDER ARTHROPLASTY;  Surgeon: [name], MD;  Location: [place];  Service: Orthopedics;  Laterality: Left;  . rotator cuff  Bilateral 1996/1998   repair  . ROTATOR CUFF REPAIR Left 1996; 1981  . ROTATOR CUFF  REPAIR Right 1996  . TONSILLECTOMY    . TOTAL HIP ARTHROPLASTY Left 10/2000  . TOTAL HIP ARTHROPLASTY Right 06/2005  . TOTAL HIP ARTHROPLASTY Left    10/2000  . VAGINAL DELIVERY     x1   Past Surgical History:  Procedure Laterality Date  . ABDOMINAL HYSTERECTOMY  1982   BSO  . ANTERIOR CERVICAL DECOMP/DISCECTOMY FUSION N/A 10/03/2014   Procedure: ANTERIOR CERVICAL DECOMPRESSION/DISCECTOMY FUSION 1 LEVEL,CERVICAL THREE-FOUR;  Surgeon: [name], MD;  Location: [place] [facility];  Service: Neurosurgery;  Laterality: N/A;  . APPENDECTOMY  1986   along with chole  . BREAST BIOPSY Right 1997   sclerosing adenosis -Dr. Lucia Gaskins  . CARDIOVERSION N/A 04/29/2019   Procedure: CARDIOVERSION;  Surgeon: Lelon Perla, MD;  Location: [facility] ENDOSCOPY;  Service: Cardiovascular;  Laterality: N/A;  . CHOLECYSTECTOMY    . COLONOSCOPY    . ESOPHAGOGASTRODUODENOSCOPY     with dilitation  . LIPOMA EXCISION Left 10/1999   left thigh  . lumbosacral cyst  2005   seen on injection for low back pain   . LUNG BIOPSY  05/2001   /w Dr.Burney- bronchoscopy, mediastinoscopy  . MELANOMA EXCISION    . REVERSE SHOULDER ARTHROPLASTY Left 02/01/2016   Procedure: LEFT REVERSE TOTAL SHOULDER ARTHROPLASTY;  Surgeon: [name], MD;  Location: [place];  Service: Orthopedics;  Laterality: Left;  . rotator cuff  Bilateral 1996/1998   repair  . ROTATOR CUFF REPAIR Left 1996; 1981  . ROTATOR CUFF REPAIR Right 1996  . TONSILLECTOMY    . TOTAL HIP ARTHROPLASTY Left 10/2000  . TOTAL HIP ARTHROPLASTY Right 06/2005  . TOTAL HIP ARTHROPLASTY Left    10/2000  . VAGINAL DELIVERY     x1   Past Medical History:  Diagnosis Date  . Asthmatic bronchitis   . Atrial fibrillation ([place]) 2019  . Cataracts, bilateral    immature  . Chronic back pain    arthritis.Stenosis  . Dysphagia   . Dysrhythmia    (?or extra beats) treated /w atenolol  . Emphysema lung ([place])    left  . GERD (gastroesophageal reflux disease)    takes  Omeprazole daily  . Hard of hearing    wears hearing aides   . History of blood transfusion    no abnormal reaction noted  . History of gastric ulcer   . Hypercholesteremia    takes Atorvastatin daily  . Hypertension    takes Coreg and Losartan daily  . Hypothyroidism    takes Synthroid daily  . IBS (irritable bowel syndrome)    takes Librax daily  . Joint pain   . Lumbosacral spondylosis without myelopathy   . Melanoma ([place])    on left leg, wide excision   . Melanoma ([place]) 2022   abdomen  .  Muscle spasm    takes Robaxin daily as needed  . Neuromuscular disorder ([place])    esophageal spasms at times   . Osteoarthritis   . Peripheral edema    take HCTZ daily  . Pneumonia    hx of.Many yrs ago  . 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 Seasonal allergies    takes Allegra daily and uses Flonase daily as needed  . Shortness of breath dyspnea    with exertion  . Venous stasis   . Yeast infection    given Diflucan today   Temp 97.8 F (36.6 C)   Ht 5\' 1"  (1.549 m)   Wt 120 lb (54.4 kg)   LMP 11/03/1980 (Approximate)   BMI 22.67 kg/m   Opioid Risk Score:   Fall Risk Score:  `1  Depression screen PHQ 2/9  Depression screen [facility] 2/9 03/14/2021 09/21/2020 07/06/2020 06/14/2020 12/14/2018 11/05/2018 09/13/2018  Decreased Interest 1 1 0 2 0 0 0  Down, Depressed, Hopeless 1 1 0 2 0 0 0  PHQ - 2 Score 2 2 0 4 0 0 0  Altered sleeping - - - - - - -  Tired, decreased energy - - - - - - -  Change in appetite - - - - - - -  Feeling bad or failure about yourself  - - - - - - -  Trouble concentrating - - - - - - -  Moving slowly or fidgety/restless - - - - - - -  Suicidal thoughts - - - - - - -  PHQ-9 Score - - - - - - -  Difficult doing work/chores - - - Very difficult - - -  Some recent data might be hidden      Review of Systems  Constitutional: Negative.   HENT: Negative.   Eyes: Negative.   Respiratory: Negative.    Cardiovascular: Negative.   Gastrointestinal: Negative.   Endocrine: Negative.   Genitourinary: Negative.   Musculoskeletal: Positive for back pain.       RIGHT SHOULDER PAIN , SEVERE SPASM LOWER LEGS DURING AT NIGHT  Skin: Negative.   Allergic/Immunologic: Negative.   Neurological: Negative.   Hematological: Negative.   Psychiatric/Behavioral: Negative.        Objective:   Physical Exam Vitals and nursing note reviewed.  Constitutional:      Appearance: She is normal weight.  HENT:     Head: Normocephalic and atraumatic.  Eyes:     Extraocular Movements: Extraocular movements intact.     Conjunctiva/sclera: Conjunctivae normal.     Pupils: Pupils are equal, round, and reactive to light.  Cardiovascular:     Rate and Rhythm: Normal rate and regular rhythm.     Heart sounds: No murmur heard.   Pulmonary:     Effort: Pulmonary effort is normal. No respiratory distress.     Breath sounds: Normal breath sounds.  Abdominal:     General: Abdomen is flat. Bowel sounds are normal. There is no distension.     Palpations: Abdomen is soft. There is no mass.  Musculoskeletal:        General: Tenderness present.     Comments: Tenderness along the lumbar paraspinals from L4-S1 area.  Normal lumbar flexion limited lumbar extension accompanied by pain  Skin:    General: Skin is warm and dry.  Neurological:     Mental Status: She is alert and oriented to person, place, and time.  Cranial Nerves: No dysarthria.     Sensory: Sensory deficit present.     Motor: No tremor or abnormal muscle tone.     Coordination: Coordination normal.     Gait: Gait is intact.     Deep Tendon Reflexes:     Reflex Scores:      Tricep reflexes are 1+ on the right side and 3+ on the left side.      Bicep reflexes are 1+ on the right side and 1+ on the left side.      Brachioradialis reflexes are 1+ on the right side and 1+ on the left side.      Patellar reflexes are 2+ on the right side and 2+ on  the left side.      Achilles reflexes are 0 on the right side and 0 on the left side.    Comments: Patient is reduced right L4 pinprick sensation compared to the left side otherwise sensation is normal and equal to pinprick and light touch bilateral C6-C7-C8 L2-L3-L4 L5-S1 dermatomes           Assessment & Plan:  21.  87 year old female with chronic low back pain lumbar spondylosis which has responded very well to lumbar medial branch blocks for prolonged period of time, it has been more than 6 months since last injection and she had at least a 70-month duration of improvements.  We will repeat bilateral L3-L4-L5 medial branch blocks. 2.  Left greater than right lower extremity pain likely due to lumbar spinal stenosis she has both foraminal stenosis multilevel bilateral as well as central stenosis most severe at L4-5 which has likely progressed over time.  I have asked her to follow-up with neurosurgery, Dr. Ellene Route who has done surgery on her cervical spine in the past. In terms of pain medication the patient is very sensitive to pain medications and gets fair relief with Tylenol.  She does not wish to try tramadol once again given that it made her too drowsy.  Also would be hesitant in trying gabapentin given her medication history. I will see her back for the medial branch blocks.

## 2021-04-04 ENCOUNTER — Other Ambulatory Visit: Payer: Self-pay | Admitting: Gastroenterology

## 2021-04-04 DIAGNOSIS — R1319 Other dysphagia: Secondary | ICD-10-CM

## 2021-04-17 ENCOUNTER — Other Ambulatory Visit: Payer: Medicare Other

## 2021-04-18 ENCOUNTER — Ambulatory Visit: Payer: Medicare Other | Admitting: Physician Assistant

## 2021-04-18 ENCOUNTER — Other Ambulatory Visit: Payer: Self-pay

## 2021-04-18 VITALS — BP 136/88 | HR 67 | Ht 61.0 in | Wt 122.2 lb

## 2021-04-18 DIAGNOSIS — Z01810 Encounter for preprocedural cardiovascular examination: Secondary | ICD-10-CM

## 2021-04-18 DIAGNOSIS — E785 Hyperlipidemia, unspecified: Secondary | ICD-10-CM

## 2021-04-18 DIAGNOSIS — I10 Essential (primary) hypertension: Secondary | ICD-10-CM

## 2021-04-18 DIAGNOSIS — I480 Paroxysmal atrial fibrillation: Secondary | ICD-10-CM

## 2021-04-18 DIAGNOSIS — E039 Hypothyroidism, unspecified: Secondary | ICD-10-CM

## 2021-04-18 NOTE — Patient Instructions (Signed)

## 2021-04-18 NOTE — Progress Notes (Signed)
Cardiology Office Note:    Date:  04/20/2021   ID:  Jenna [name], DOB May 04, 1933, MRN [id number]  PCP:  [name], MD   [facility] Providers Cardiologist:  Peter Martinique, MD     Referring MD: [name], MD   Chief Complaint  Patient presents with   Follow-up    Seen for Dr. Martinique    History of Present Illness:    Jenna [name] is a 87 y.o. female with a hx of PAF, GERD, hypertension, hyperlipidemia, and hypothyroidism.  Patient was first diagnosed with atrial fibrillation in January 2020.  However she suspected her A. fib may have dated back to September 2019 when she first noticed fatigue.  She was started on Eliquis 2.5 mg twice a day given her age and BMI.  Echocardiogram performed on January 12, 2019 showed EF 45 to 50% with moderate LAE.  She was initially supposed to undergo cardioversion, this was delayed due to onset of COVID-19 pandemic.  She ultimately underwent DCCV on 04/29/2019.  She had recurrence in July 2020, amiodarone was added. She subsequently converted back to sinus rhythm on amiodarone therapy.  Due to side effect, amiodarone was initially reduced and then eventually discontinued in October 2020.  I last saw the patient in July 2021 at which time she was doing well.  Patient presents for belated 47-month follow-up.  She denies any chest pain or worsening shortness of breath.  She will occasionally feel her heart going into atrial fibrillation however this only last a few hours before she self converted back to sinus rhythm.  She has both carvedilol and atenolol listed, atenolol has not been prescribed to her since 2016.  She also has triamterene-hydrochlorothiazide listed which has not been prescribed to her for several years as well.  Those 2 medication will be removed.  Heart rate seems to be very well controlled on carvedilol.  She has no lower extremity edema, orthopnea or PND.  Overall she has been doing well and can follow-up with Dr. Martinique in 6  to 9 months.  Note, she may ended up needing back injection in the near future.  She is a low risk patient and may proceed with the back injection.  If requested, she may hold Eliquis for 3 days prior to the back injection and restart as soon as possible afterward at the surgeon's discretion.   Past Medical History:  Diagnosis Date   Asthmatic bronchitis    Atrial fibrillation ([place]) 2019   Cataracts, bilateral    immature   Chronic back pain    arthritis.Stenosis   Dysphagia    Dysrhythmia    (?or extra beats) treated /w atenolol   Emphysema lung ([facility])    left   GERD (gastroesophageal reflux disease)    takes Omeprazole daily   Hard of hearing    wears hearing aides    History of blood transfusion    no abnormal reaction noted   History of gastric ulcer    Hypercholesteremia    takes Atorvastatin daily   Hypertension    takes Coreg and Losartan daily   Hypothyroidism    takes Synthroid daily   IBS (irritable bowel syndrome)    takes Librax daily   Joint pain    Lumbosacral spondylosis without myelopathy    Melanoma ([place])    on left leg, wide excision    Melanoma ([place]) 2022   abdomen   Muscle spasm    takes Robaxin daily as needed  Neuromuscular disorder ([place])    esophageal spasms at times    Osteoarthritis    Peripheral edema    take HCTZ daily   Pneumonia    hx of.Many yrs ago   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   developed into pneumonia, resolved within a year   Seasonal allergies    takes Allegra daily and uses Flonase daily as needed   Shortness of breath dyspnea    with exertion   Venous stasis    Yeast infection    given Diflucan today    Past Surgical History:  Procedure Laterality Date   ABDOMINAL HYSTERECTOMY  1982   BSO   ANTERIOR CERVICAL DECOMP/DISCECTOMY FUSION N/A 10/03/2014   Procedure: ANTERIOR CERVICAL DECOMPRESSION/DISCECTOMY FUSION 1 LEVEL,CERVICAL THREE-FOUR;  Surgeon: [name], MD;  Location: [facility];  Service: Neurosurgery;   Laterality: N/A;   APPENDECTOMY  1986   along with chole   BREAST BIOPSY Right 1997   sclerosing adenosis -Dr. Lucia Gaskins   CARDIOVERSION N/A 04/29/2019   Procedure: CARDIOVERSION;  Surgeon: Lelon Perla, MD;  Location: [facility] ENDOSCOPY;  Service: Cardiovascular;  Laterality: N/A;   CHOLECYSTECTOMY     COLONOSCOPY     ESOPHAGOGASTRODUODENOSCOPY     with dilitation   LIPOMA EXCISION Left 10/1999   left thigh   lumbosacral cyst  2005   seen on injection for low back pain    LUNG BIOPSY  05/2001   /w Dr.Burney- bronchoscopy, mediastinoscopy   MELANOMA EXCISION     REVERSE SHOULDER ARTHROPLASTY Left 02/01/2016   Procedure: LEFT REVERSE TOTAL SHOULDER ARTHROPLASTY;  Surgeon: [name], MD;  Location: [place];  Service: Orthopedics;  Laterality: Left;   rotator cuff  Bilateral 1996/1998   repair   ROTATOR CUFF REPAIR Left 1996; [place] Right 1996   TONSILLECTOMY     TOTAL HIP ARTHROPLASTY Left 10/2000   TOTAL HIP ARTHROPLASTY Right 06/2005   TOTAL HIP ARTHROPLASTY Left    10/2000   VAGINAL DELIVERY     x1    Current Medications: No outpatient medications have been marked as taking for the 04/18/21 encounter (Office Visit) with Almyra Deforest, PA.     Allergies:   Codeine, Pseudoephedrine hcl, Tylenol [acetaminophen], Ultram [tramadol], Vicodin [hydrocodone-acetaminophen], Dalmane [flurazepam hcl], Flurazepam, Nortriptyline, Other, Propoxyphene, Cephalexin, Clarithromycin, Levofloxacin, and Vibramycin [doxycycline calcium]   Social History   Socioeconomic History   Marital status: Married    Spouse name: Not on file   Number of children: Not on file   Years of education: Not on file   Highest education level: Not on file  Occupational History   Not on file  Tobacco Use   Smoking status: Former    Packs/day: 1.00    Years: 15.00    Pack years: 15.00    Types: Cigarettes   Smokeless tobacco: Never   Tobacco comments:    quit smoking in 1985  Vaping Use    Vaping Use: Never used  Substance and Sexual Activity   Alcohol use: Yes    Alcohol/week: 3.0 standard drinks    Types: 3 Glasses of wine per week   Drug use: No   Sexual activity: Not Currently    Partners: Male    Birth control/protection: Surgical    Comment: hysterectomy  Other Topics Concern   Not on file  Social History Narrative   Not on file   Social Determinants of Health   Financial Resource Strain: Not on file  Food Insecurity:  Not on file  Transportation Needs: Not on file  Physical Activity: Not on file  Stress: Not on file  Social Connections: Not on file     Family History: The patient's family history includes Diabetes in her brother; Heart attack in her father; Heart disease in her brother; Stroke in her mother.  ROS:   Please see the history of present illness.     All other systems reviewed and are negative.  EKGs/Labs/Other Studies Reviewed:    The following studies were reviewed today:  Echo 01/12/2019  1. The left ventricle has mildly reduced systolic function, with an  ejection fraction of 45-50%. The cavity size was normal. Left ventricular  diastolic function could not be evaluated secondary to atrial  fibrillation. Left ventrical global hypokinesis  without regional wall motion abnormalities.   2. The right ventricle has normal systolic function. The cavity was  normal. There is no increase in right ventricular wall thickness.   3. Left atrial size was moderately dilated.   4. The aortic valve is tricuspid Moderate thickening of the aortic valve  Moderate calcification of the aortic valve. Aortic valve regurgitation was  not assessed by color flow Doppler.   EKG:  EKG is ordered today.  The ekg ordered today demonstrates normal sinus rhythm, no significant ST-T wave changes.  Recent Labs: No results found for requested labs within last 8760 hours.  Recent Lipid Panel No results found for: CHOL, TRIG, HDL, CHOLHDL, VLDL, LDLCALC,  LDLDIRECT   Risk Assessment/Calculations:    CHA2DS2-VASc Score = 4  This indicates a 4.8% annual risk of stroke. The patient's score is based upon: CHF History: No HTN History: Yes Diabetes History: No Stroke History: No Vascular Disease History: No Age Score: 2 Gender Score: 1     Physical Exam:    VS:  BP 136/88   Pulse 67   Ht 5\' 1"  (1.549 m)   Wt 122 lb 3.2 oz (55.4 kg)   LMP 11/03/1980 (Approximate)   SpO2 93%   BMI 23.09 kg/m     Wt Readings from Last 3 Encounters:  04/18/21 122 lb 3.2 oz (55.4 kg)  03/14/21 120 lb (54.4 kg)  02/27/21 120 lb (54.4 kg)     GEN:  Well nourished, well developed in no acute distress HEENT: Normal NECK: No JVD; No carotid bruits LYMPHATICS: No lymphadenopathy CARDIAC: RRR, no murmurs, rubs, gallops RESPIRATORY:  Clear to auscultation without rales, wheezing or rhonchi  ABDOMEN: Soft, non-tender, non-distended MUSCULOSKELETAL:  No edema; No deformity  SKIN: Warm and dry NEUROLOGIC:  Alert and oriented x 3 PSYCHIATRIC:  Normal affect   ASSESSMENT:    1. PAF (paroxysmal atrial fibrillation) ([place])   2. Primary hypertension   3. Hyperlipidemia LDL goal <100   4. Hypothyroidism, unspecified type   5. Preop cardiovascular exam    PLAN:    In order of problems listed above:  PAF: Rare episode of palpitation.  Currently maintaining sinus rhythm.  Continue carvedilol and Eliquis.  Hypertension: Blood pressure stable  Hyperlipidemia: On Lipitor  Hypothyroidism: Managed by primary care provider  Preoperative clearance: If she require back injection in the near future, she is cleared as a low risk candidate.  She may hold her Eliquis for 3 days prior to the procedure and restart as soon as possible afterward.         Medication Adjustments/Labs and Tests Ordered: Current medicines are reviewed at length with the patient today.  Concerns regarding medicines are outlined above.  Orders Placed This Encounter  Procedures    EKG 12-Lead   No orders of the defined types were placed in this encounter.   Patient Instructions  Medication Instructions:  Your physician recommends that you continue on your current medications as directed. Please refer to the Current Medication list given to you today.  *If you need a refill on your cardiac medications before your next appointment, please call your pharmacy*   Lab Work: NONE ordered at this time of appointment   If you have labs (blood work) drawn today and your tests are completely normal, you will receive your results only by: [place] (if you have MyChart) OR A paper copy in the mail If you have any lab test that is abnormal or we need to change your treatment, we will call you to review the results.  Testing/Procedures: NONE ordered at this time of appointment    Follow-Up: At [facility], you and your health needs are our priority.  As part of our continuing mission to provide you with exceptional heart care, we have created designated Provider Care Teams.  These Care Teams include your primary Cardiologist (physician) and Advanced Practice Providers (APPs -  Physician Assistants and Nurse Practitioners) who all work together to provide you with the care you need, when you need it.  Your next appointment:   6 month(s)  The format for your next appointment:   In Person  Provider:   Peter Martinique, MD  Other Instructions    Signed, Almyra Deforest, [place]  04/20/2021 11:50 PM    [place]

## 2021-04-20 ENCOUNTER — Encounter: Payer: Self-pay | Admitting: Physician Assistant

## 2021-04-26 ENCOUNTER — Encounter: Payer: Medicare Other | Attending: Physical Medicine & Rehabilitation | Admitting: Physical Medicine & Rehabilitation

## 2021-04-26 ENCOUNTER — Other Ambulatory Visit: Payer: Self-pay

## 2021-04-26 ENCOUNTER — Encounter: Payer: Self-pay | Admitting: Physical Medicine & Rehabilitation

## 2021-04-26 DIAGNOSIS — M47817 Spondylosis without myelopathy or radiculopathy, lumbosacral region: Secondary | ICD-10-CM | POA: Diagnosis present

## 2021-04-26 NOTE — Progress Notes (Signed)
  PROCEDURE RECORD [facility]   Name: Jenna [name] DOB:Mar 20, 1933 MRN: [id number]  Date:04/26/2021  Physician: Alysia Penna, MD    Nurse/CMA: Truman Hayward, CMA  Allergies:  Allergies  Allergen Reactions   Codeine Shortness Of Breath   Pseudoephedrine Hcl Shortness Of Breath   Tylenol [Acetaminophen] Shortness Of Breath and Palpitations   Ultram [Tramadol] Shortness Of Breath   Vicodin [Hydrocodone-Acetaminophen] Shortness Of Breath   Dalmane [Flurazepam Hcl] Other (See Comments)    confusion   Flurazepam Other (See Comments)    confusion   Nortriptyline     Unknown reaction    Other Other (See Comments)   Propoxyphene Other (See Comments)   Cephalexin Nausea Only   Clarithromycin Nausea Only   Levofloxacin Anxiety   Vibramycin [Doxycycline Calcium] Rash    Consent Signed: Yes.    Is patient diabetic? No.  CBG today?   Pregnant: No. LMP: Patient's last menstrual period was 11/03/1980 (approximate). (age 42-55)  Anticoagulants: yes (Eliquis) Anti-inflammatory: no Antibiotics: no  Procedure: Right L3-4-5 Medial Branch Block  Position: Prone Start Time: 1:21 pm  End Time: 1:27 pm  Fluoro Time: 23  RN/CMA Truman Hayward, CMA Kaleisha Bhargava, CMA    Time 1:10 pm 1:34 pm 1:39 pm   BP 153/77 182/81 160/75   Pulse 71 70    Respirations 16 16    O2 Sat 98 97    S/S 6 6    Pain Level 3/10 1/10     D/C home with no one, [name], D/C instructions reviewed, and sits independently.

## 2021-04-26 NOTE — Progress Notes (Signed)
Right lumbar L3, L4 medial branch blocks and L5 dorsal ramus injection under fluoroscopic guidance  Indication: Right Lumbar pain which is not relieved by medication management or other conservative care and interfering with self-care and mobility.  Informed consent was obtained after describing risks and benefits of the procedure with the patient, this includes bleeding, bruising, infection, paralysis and medication side effects. The patient wishes to proceed and has given written consent. The patient was placed in a prone position. The lumbar area was marked and prepped with Betadine. One ML of 1% lidocaine was injected into each of 3 areas into the skin and subcutaneous tissue. Then a 22-gauge 3.5 in spinal needle was inserted targeting the junction of the Right S1 superior articular process and sacral ala junction. Needle was advanced under fluoroscopic guidance. Bone contact was made.Isovue 200 was injected x0.5 mL demonstrating no intravascular uptake. Then a solution containing 2% MPF lidocaine was injected x0.5 mL. Then the Right L5 superior articular process in transverse process junction was targeted. Bone contact was made.Isovue 200 was injected x0.5 mL demonstrating no intravascular uptake. Then a solution containing 2% MPF lidocaine was injected x0.5 mL. Then the Right L4 superior articular process in transverse process junction was targeted. Bone contact was made. Isovue 200 was injected x0.5 mL demonstrating no intravascular uptake. Then a solution containing2% MPF lidocaine was injected x0.5 mL Patient tolerated procedure well. Post procedure instructions were given. Please refer to post procedure form. 

## 2021-04-26 NOTE — Patient Instructions (Signed)
Right Lumbar medial branch blocks were performed. This is to help diagnose the cause of the low back pain. It is important that you keep track of your pain for the first day or 2 after injection. This injection can give you temporary relief that lasts for hours or up to several months. There is no way to predict duration of pain relief.  Please try to compare your pain after injection to for the injection.  If this injection gives you  temporary relief there may be another longer-lasting procedure that may be beneficial call radiofrequency ablation

## 2021-05-16 ENCOUNTER — Ambulatory Visit: Payer: Self-pay | Admitting: *Deleted

## 2021-05-16 NOTE — Telephone Encounter (Signed)
Reason for Disposition . COVID-19 vaccine, Frequently Asked Questions (FAQs)  Answer Assessment - Initial Assessment Questions 1. MAIN CONCERN OR SYMPTOM:  "What is your main concern right now?" "What question do you have?" "What's the main symptom you're worried about?" (e.g., fever, pain, redness, swelling)     Patient is concerned about getting the vaccine booster- but she wants to know if she should wait.  2. VACCINE: "What vaccination did you receive?" (e.g., none; AstraZeneca, J&J, [place], other) "Is this your first, second shot, or booster?" (e.g., first, second, booster)     [place] patient she should not wait- if she is due the fourth booster- protect herself now- not knowing when the new vaccine for new variants will be available. I am sure recommendations on spacing will be available when it does get released.  Protocols used: Coronavirus (COVID-19) Vaccine Questions and Reactions-A-AH

## 2021-05-28 ENCOUNTER — Ambulatory Visit: Payer: Medicare Other | Admitting: Registered Nurse

## 2021-06-24 ENCOUNTER — Other Ambulatory Visit: Payer: Medicare Other

## 2021-06-26 ENCOUNTER — Other Ambulatory Visit: Payer: Self-pay

## 2021-06-26 ENCOUNTER — Ambulatory Visit
Admission: RE | Admit: 2021-06-26 | Discharge: 2021-06-26 | Disposition: A | Discharge status: Home / Self Care | Payer: Medicare Other | Source: Ambulatory Visit | Attending: Gastroenterology | Admitting: Gastroenterology

## 2021-06-26 DIAGNOSIS — R1319 Other dysphagia: Secondary | ICD-10-CM

## 2021-08-06 ENCOUNTER — Ambulatory Visit: Payer: Medicare Other | Admitting: Physical Medicine & Rehabilitation

## 2021-09-05 ENCOUNTER — Other Ambulatory Visit: Payer: Self-pay | Admitting: Physician Assistant

## 2021-09-05 NOTE — Telephone Encounter (Signed)
Prescription refill request for Eliquis received. Indication:Afib Last office visit:6/22 UYW:XIPPN labs Age: 87 Weight:55.4 kg  Prescription refilled

## 2021-09-10 ENCOUNTER — Encounter: Payer: Self-pay | Admitting: Physical Medicine & Rehabilitation

## 2021-09-10 ENCOUNTER — Encounter: Payer: Medicare Other | Attending: Physical Medicine & Rehabilitation | Admitting: Physical Medicine & Rehabilitation

## 2021-09-10 ENCOUNTER — Other Ambulatory Visit: Payer: Self-pay

## 2021-09-10 VITALS — BP 176/91 | HR 79 | Ht 61.0 in | Wt 122.6 lb

## 2021-09-10 DIAGNOSIS — M7918 Myalgia, other site: Secondary | ICD-10-CM | POA: Diagnosis present

## 2021-09-10 DIAGNOSIS — M47817 Spondylosis without myelopathy or radiculopathy, lumbosacral region: Secondary | ICD-10-CM | POA: Diagnosis present

## 2021-09-10 DIAGNOSIS — M4712 Other spondylosis with myelopathy, cervical region: Secondary | ICD-10-CM | POA: Insufficient documentation

## 2021-09-10 NOTE — Progress Notes (Signed)
Subjective:    Patient ID: Jenna [name], female    DOB: Nov 13, 1932, 87 y.o.   MRN: [id number]  HPI 87 year old female with history of lumbar spinal stenosis as well as cervical spinal stenosis as well as severe arthritis bilateral glenohumeral joints.  The patient remains independent with all self-care and mobility.  She is no longer taking narcotic analgesics for her pain. She has primary complaint of right-sided neck pain.  She does not have any pain radiating down the right arm.  She does have right shoulder limitations due to pain and is following up with orthopedics to see whether she will have surgery on that shoulder.  Right-sided neck pain does radiate into the back of her scalp on the right side.  She has no numbness or tingling in the back of the scalp. Has not seen Neurosurgery , Dr Ellene Route  Hx of C3-4 ACDF, she has been told by Dr. Ellene Route in the past that she may need additional levels fused. Last Cervical MRI 12/11/2017- showing severe LEFT C2-3 facet arthritis, no similar findings on the right side at that level.  Plans to have reverse shoulder arthroplasty on the right side.  In terms of her lumbar spine, history of lumbar stenosis as well as lumbar spondylosis.  She had a short-term relief of greater than 50% with the L3-4-5 medial branch blocks.  We discussed that neck step would be radiofrequency neurotomy.  She did have a paradoxical long duration of response with prior L3-4-5 medial branch blocks and a relatively short duration response to RF in the past.  She is not looking to pursue this at the current time. Pain Inventory Average Pain 5 Pain Right Now 5 My pain is intermittent and aching  In the last 24 hours, has pain interfered with the following? General activity 3 Relation with others 2 Enjoyment of life 6 What TIME of day is your pain at its worst? evening Sleep (in general) NA  Pain is worse with: sitting and some activites Pain improves with: rest and  pacing activities Relief from Meds: 5 with tylenol  Family History  Problem Relation Age of Onset   Stroke Mother    Heart attack Father    Diabetes Brother    Heart disease Brother    Social History   Socioeconomic History   Marital status: Married    Spouse name: Not on file   Number of children: Not on file   Years of education: Not on file   Highest education level: Not on file  Occupational History   Not on file  Tobacco Use   Smoking status: Former    Packs/day: 1.00    Years: 15.00    Pack years: 15.00    Types: Cigarettes   Smokeless tobacco: Never   Tobacco comments:    quit smoking in 1985  Vaping Use   Vaping Use: Never used  Substance and Sexual Activity   Alcohol use: Yes    Alcohol/week: 3.0 standard drinks    Types: 3 Glasses of wine per week   Drug use: No   Sexual activity: Not Currently    Partners: Male    Birth control/protection: Surgical    Comment: hysterectomy  Other Topics Concern   Not on file  Social History Narrative   Not on file   Social Determinants of Health   Financial Resource Strain: Not on file  Food Insecurity: Not on file  Transportation Needs: Not on file  Physical Activity: Not on  file  Stress: Not on file  Social Connections: Not on file   Past Surgical History:  Procedure Laterality Date   [place] DECOMP/DISCECTOMY FUSION N/A 10/03/2014   Procedure: ANTERIOR CERVICAL DECOMPRESSION/DISCECTOMY FUSION 1 LEVEL,CERVICAL THREE-FOUR;  Surgeon: [name], MD;  Location: [place] [facility]S;  Service: Neurosurgery;  Laterality: N/A;   APPENDECTOMY  1986   along with chole   BREAST BIOPSY Right 1997   sclerosing adenosis -Dr. Lucia Gaskins   CARDIOVERSION N/A 04/29/2019   Procedure: CARDIOVERSION;  Surgeon: Lelon Perla, MD;  Location: [facility];  Service: Cardiovascular;  Laterality: N/A;   CHOLECYSTECTOMY     COLONOSCOPY     ESOPHAGOGASTRODUODENOSCOPY     with dilitation    LIPOMA EXCISION Left 10/1999   left thigh   lumbosacral cyst  2005   seen on injection for low back pain    LUNG BIOPSY  05/2001   /w Dr.Burney- bronchoscopy, mediastinoscopy   MELANOMA EXCISION     REVERSE SHOULDER ARTHROPLASTY Left 02/01/2016   Procedure: LEFT REVERSE TOTAL SHOULDER ARTHROPLASTY;  Surgeon: [name], MD;  Location: [place];  Service: Orthopedics;  Laterality: Left;   rotator cuff  Bilateral 1996/1998   repair   ROTATOR CUFF REPAIR Left 1996; 1981   ROTATOR CUFF REPAIR Right 1996   TONSILLECTOMY     TOTAL HIP ARTHROPLASTY Left 10/2000   TOTAL HIP ARTHROPLASTY Right 06/2005   TOTAL HIP ARTHROPLASTY Left    10/2000   VAGINAL DELIVERY     x1   Past Surgical History:  Procedure Laterality Date   ABDOMINAL HYSTERECTOMY  1982   BSO   ANTERIOR CERVICAL DECOMP/DISCECTOMY FUSION N/A 10/03/2014   Procedure: ANTERIOR CERVICAL DECOMPRESSION/DISCECTOMY FUSION 1 LEVEL,CERVICAL THREE-FOUR;  Surgeon: [name], MD;  Location: [facility]UR[facility];  Service: Neurosurgery;  Laterality: N/A;   APPENDECTOMY  1986   along with chole   BREAST BIOPSY Right 1997   sclerosing adenosis -Dr. Lucia Gaskins   CARDIOVERSION N/A 04/29/2019   Procedure: CARDIOVERSION;  Surgeon: Lelon Perla, MD;  Location: [facility] ENDOSCOPY;  Service: Cardiovascular;  Laterality: N/A;   CHOLECYSTECTOMY     COLONOSCOPY     ESOPHAGOGASTRODUODENOSCOPY     with dilitation   LIPOMA EXCISION Left 10/1999   left thigh   lumbosacral cyst  2005   seen on injection for low back pain    LUNG BIOPSY  05/2001   /w Dr.Burney- bronchoscopy, mediastinoscopy   MELANOMA EXCISION     REVERSE SHOULDER ARTHROPLASTY Left 02/01/2016   Procedure: LEFT REVERSE TOTAL SHOULDER ARTHROPLASTY;  Surgeon: [name], MD;  Location: [place];  Service: Orthopedics;  Laterality: Left;   rotator cuff  Bilateral 1996/1998   repair   ROTATOR CUFF REPAIR Left 1996; [place] Right 1996   TONSILLECTOMY     TOTAL HIP ARTHROPLASTY Left  10/2000   TOTAL HIP ARTHROPLASTY Right 06/2005   TOTAL HIP ARTHROPLASTY Left    10/2000   VAGINAL DELIVERY     x1   Past Medical History:  Diagnosis Date   Asthmatic bronchitis    Atrial fibrillation ([place]) 2019   Cataracts, bilateral    immature   Chronic back pain    arthritis.Stenosis   Dysphagia    Dysrhythmia    (?or extra beats) treated /w atenolol   Emphysema lung (HCC)    left   GERD (gastroesophageal reflux disease)    takes Omeprazole daily   Hard  of hearing    wears hearing aides    History of blood transfusion    no abnormal reaction noted   History of gastric ulcer    Hypercholesteremia    takes Atorvastatin daily   Hypertension    takes Coreg and Losartan daily   Hypothyroidism    takes Synthroid daily   IBS (irritable bowel syndrome)    takes Librax daily   Joint pain    Lumbosacral spondylosis without myelopathy    Melanoma ([place])    on left leg, wide excision    Melanoma ([place]) 2022   abdomen   Muscle spasm    takes Robaxin daily as needed   Neuromuscular disorder ([place])    esophageal spasms at times    Osteoarthritis    Peripheral edema    take HCTZ daily   Pneumonia    hx of.Many yrs ago   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   developed into pneumonia, resolved within a year   Seasonal allergies    takes Allegra daily and uses Flonase daily as needed   Shortness of breath dyspnea    with exertion   Venous stasis    Yeast infection    given Diflucan today   BP (!) 176/91   Pulse 79   Ht 5\' 1"  (1.549 m)   Wt 122 lb 9.6 oz (55.6 kg)   LMP 11/03/1980 (Approximate)   SpO2 96%   BMI 23.17 kg/m   Opioid Risk Score:   Fall Risk Score:  `1  Depression screen PHQ 2/9  Depression screen [facility] 2/9 09/10/2021 03/14/2021 09/21/2020 07/06/2020 06/14/2020 12/14/2018 11/05/2018  Decreased Interest 0 1 1 0 2 0 0  Down, Depressed, Hopeless 0 1 1 0 2 0 0  PHQ - 2 Score 0 2 2 0 4 0 0  Altered sleeping - - - - - - -  Tired, decreased energy - - - - - - -   Change in appetite - - - - - - -  Feeling bad or failure about yourself  - - - - - - -  Trouble concentrating - - - - - - -  Moving slowly or fidgety/restless - - - - - - -  Suicidal thoughts - - - - - - -  PHQ-9 Score - - - - - - -  Difficult doing work/chores - - - - Very difficult - -  Some recent data might be hidden     Review of Systems  Constitutional: Negative.   HENT: Negative.    Eyes: Negative.   Respiratory: Negative.    Cardiovascular: Negative.   Gastrointestinal: Negative.   Endocrine: Negative.   Genitourinary: Negative.   Musculoskeletal:  Positive for arthralgias.       Right shoulder pain  Skin: Negative.   Allergic/Immunologic: Negative.   Neurological: Negative.   Hematological: Negative.   Psychiatric/Behavioral: Negative.    All other systems reviewed and are negative.     Objective:   Physical Exam Vitals and nursing note reviewed.  Constitutional:      Appearance: She is normal weight.  HENT:     Head: Normocephalic and atraumatic.  Eyes:     Extraocular Movements: Extraocular movements intact.     Conjunctiva/sclera: Conjunctivae normal.     Pupils: Pupils are equal, round, and reactive to light.  Musculoskeletal:     Comments: Positive drop arm test right shoulder, pain with shoulder abduction.  She is able to reach behind her head as well  as behind her back with both hands. Cervical spine range of motion is within functional limits Lumbar spine range of motion is within normal limits  There is tenderness to palpation and tightness over the right trapezius.   Skin:    General: Skin is warm and dry.  Neurological:     Mental Status: She is alert and oriented to person, place, and time.     Comments: Negative straight leg raising bilateral lower extremities Motor strength is 4 - bilateral deltoid 5 bilateral bicep tricep and grip 5 bilateral hip flexor knee extensor ankle dorsiflexor. Gait without evidence of toe drag or knee  instability.  No evidence of spasticity in the upper or lower limbs.  Psychiatric:        Mood and Affect: Mood normal.        Behavior: Behavior normal.          Assessment & Plan:  #1.  Cervical postlaminectomy syndrome in a patient with cervical stenosis no evidence of myelopathy or radiculopathy at the current time. 2.  Cervical myofascial pain right trapezius this is likely due to substitution pattern associated with right shoulder pain from glenohumeral arthritis.  She has tried topical  analgesics as well as heat.    Trigger Point Injection  Indication: Right trapezius myofascial pain not relieved by medication management and other conservative care.  Informed consent was obtained after describing risk and benefits of the procedure with the patient, this includes bleeding, bruising, infection and medication side effects.  The patient wishes to proceed and has given written consent.  The patient was placed in a seated position.  The right upper trapezius area was marked and prepped with Betadine.  It was entered with a 25-gauge 1-1/2 inch needle and 2 mL of 1% lidocaine was injected into each of 1 trigger points, after negative draw back for blood.  The patient tolerated the procedure well.  Post procedure instructions were given.  3.  Lumbar spinal stenosis as well as lumbar spondylosis no evidence of radiculopathy.  We discussed potential for lumbar RF she would like to hold off on this at the current time.

## 2021-09-10 NOTE — Patient Instructions (Signed)

## 2021-10-08 ENCOUNTER — Ambulatory Visit: Payer: Medicare Other | Admitting: Cardiology

## 2021-10-10 ENCOUNTER — Other Ambulatory Visit: Payer: Self-pay | Admitting: Physician Assistant

## 2021-10-10 NOTE — Telephone Encounter (Signed)
Prescription refill request for Eliquis received. Indication:Afib Last office visit:6/22 SKS:[facility] Age: 87 Weight:55.6 kg  Prescription refilled

## 2021-11-01 ENCOUNTER — Telehealth: Payer: Self-pay | Admitting: *Deleted

## 2021-11-01 NOTE — Telephone Encounter (Signed)
Primary Cardiologist:Peter Martinique, MD  Chart reviewed as part of pre-operative protocol coverage. Because of Jenna [name]'s past medical history and time since last visit, he/she will require a follow-up visit in order to better assess preoperative cardiovascular risk.  Pre-op covering staff: - Please schedule appointment and call patient to inform them. Patient has an appointment with Jenna Deforest, PA on 11/27/21.  - Please contact requesting surgeon's office via preferred method (i.e, phone, fax) to inform them of need for appointment prior to surgery.  If applicable, this message will also be routed to pharmacy pool and/or primary cardiologist for input on holding anticoagulant/antiplatelet agent as requested below so that this information is available at time of patient's appointment.  Routing to pharmacy for guidance regarding Eliquis.   Emmaline Life, NP-C    11/01/2021, 4:46 PM [place] [address] Office [phone] Fax [phone]

## 2021-11-01 NOTE — Telephone Encounter (Signed)
Pt has appt 11/27/21 with Almyra Deforest, PAC. Will forward clearance notes to PA for upcoming appt. Will send FYI to requesting office pt has appt 11/27/21.

## 2021-11-01 NOTE — Telephone Encounter (Signed)
° °  Pre-operative Risk Assessment    Patient Name: Jenna [name]  DOB: 1933-09-30 MRN: [id number]      Request for Surgical Clearance    Procedure:   Right reverse total shoulder   Date of Surgery:  Clearance 03/23                                 Surgeon:  Dr Esmond Plants  Surgeon's Group or Practice Name:  [facility] Phone number:  [phone]  Fax number:  [phone] attn [place]    Type of Clearance Requested:   - Pharmacy:  Hold Apixaban (Eliquis)    Type of Anesthesia:  General    Additional requests/questions:  Please advise surgeon/provider what medications should be held.  Olin Pia   11/01/2021, 4:39 PM

## 2021-11-05 NOTE — Telephone Encounter (Signed)
Patient with diagnosis of A Fib on Eliquis for anticoagulation.    Procedure: Right reverse total shoulder  Date of procedure: 3/23   CHA2DS2-VASc Score = 4  This indicates a 4.8% annual risk of stroke. The patient's score is based upon: CHF History: 0 HTN History: 1 Diabetes History: 0 Stroke History: 0 Vascular Disease History: 0 Age Score: 2 Gender Score: 1   CrCl unknown Platelet count unknown  Patient needs updated CMP/CBC before clearance can be completed

## 2021-11-05 NOTE — Telephone Encounter (Signed)
Will address cardiac clearance on follow up visit.

## 2021-11-25 ENCOUNTER — Ambulatory Visit: Payer: Medicare Other | Admitting: Obstetrics and Gynecology

## 2021-11-25 ENCOUNTER — Other Ambulatory Visit: Payer: Self-pay

## 2021-11-25 ENCOUNTER — Encounter: Payer: Self-pay | Admitting: Obstetrics and Gynecology

## 2021-11-25 VITALS — BP 158/82 | HR 83

## 2021-11-25 DIAGNOSIS — N764 Abscess of vulva: Secondary | ICD-10-CM | POA: Diagnosis not present

## 2021-11-25 MED ORDER — SULFAMETHOXAZOLE-TRIMETHOPRIM 800-160 MG PO TABS: 1.0000 | ORAL_TABLET | Freq: Two times a day (BID) | ORAL | 0 refills | Status: DC

## 2021-11-25 NOTE — Patient Instructions (Signed)
Skin Abscess  A skin abscess is an infected area on or under your skin that contains a collection of pus and other material. An abscess may also be called a furuncle,carbuncle, or boil. An abscess can occur in or on almost any part of your body. Some abscesses break open (rupture) on their own. Most continue to get worse unless they are treated. The infection can spread deeper into the body and eventually into your blood, whichcan make you feel ill. Treatment usually involves draining the abscess. What are the causes? An abscess occurs when germs, like bacteria, pass through your skin and cause an infection. This may be caused by: A scrape or cut on your skin. A puncture wound through your skin, including a needle injection or insect bite. Blocked oil or sweat glands. Blocked and infected hair follicles. A cyst that forms beneath your skin (sebaceous cyst) and becomes infected. What increases the risk? This condition is more likely to develop in people who: Have a weak body defense system (immune system). Have diabetes. Have dry and irritated skin. Get frequent injections or use illegal IV drugs. Have a foreign body in a wound, such as a splinter. Have problems with their lymph system or veins. What are the signs or symptoms? Symptoms of this condition include: A painful, firm bump under the skin. A bump with pus at the top. This may break through the skin and drain. Other symptoms include: Redness surrounding the abscess site. Warmth. Swelling of the lymph nodes (glands) near the abscess. Tenderness. A sore on the skin. How is this diagnosed? This condition may be diagnosed based on: A physical exam. Your medical history. A sample of pus. This may be used to find out what is causing the infection. Blood tests. Imaging tests, such as an ultrasound, CT scan, or MRI. How is this treated? A small abscess that drains on its own may not need treatment. Treatment for larger abscesses  may include: Moist heat or heat pack applied to the area several times a day. A procedure to drain the abscess (incision and drainage). Antibiotic medicines. For a severe abscess, you may first get antibiotics through an IV and then change to antibiotics by mouth. Follow these instructions at home: Medicines  Take over-the-counter and prescription medicines only as told by your health care provider. If you were prescribed an antibiotic medicine, take it as told by your health care provider. Do not stop taking the antibiotic even if you start to feel better.  Abscess care  If you have an abscess that has not drained, apply heat to the affected area. Use the heat source that your health care provider recommends, such as a moist heat pack or a heating pad. Place a towel between your skin and the heat source. Leave the heat on for 20-30 minutes. Remove the heat if your skin turns bright red. This is especially important if you are unable to feel pain, heat, or cold. You may have a greater risk of getting burned. Follow instructions from your health care provider about how to take care of your abscess. Make sure you: Cover the abscess with a bandage (dressing). Change your dressing or gauze as told by your health care provider. Wash your hands with soap and water before you change the dressing or gauze. If soap and water are not available, use hand sanitizer. Check your abscess every day for signs of a worsening infection. Check for: More redness, swelling, or pain. More fluid or blood. Warmth. More   pus or a bad smell.  General instructions To avoid spreading the infection: Do not share personal care items, towels, or hot tubs with others. Avoid making skin contact with other people. Keep all follow-up visits as told by your health care provider. This is important. Contact a health care provider if you have: More redness, swelling, or pain around your abscess. More fluid or blood coming  from your abscess. Warm skin around your abscess. More pus or a bad smell coming from your abscess. A fever. Muscle aches. Chills or a general ill feeling. Get help right away if you: Have severe pain. See red streaks on your skin spreading away from the abscess. Summary A skin abscess is an infected area on or under your skin that contains a collection of pus and other material. A small abscess that drains on its own may not need treatment. Treatment for larger abscesses may include having a procedure to drain the abscess and taking an antibiotic. This information is not intended to replace advice given to you by your health care provider. Make sure you discuss any questions you have with your healthcare provider. Document Revised: 02/10/2019 Document Reviewed: 12/03/2017 Elsevier Patient Education  2022 [employer]  

## 2021-11-25 NOTE — Progress Notes (Signed)
GYNECOLOGY  VISIT   HPI: 88 y.o.   Married  Caucasian  female   G1P1001 with Patient's last menstrual period was 11/03/1980 (approximate).   here for right vulvar lesion for over one week.  Has soreness when she touches it or wipes.  No drainage.  No fevers.  Had something similar to this before.   Used abx ointment.   Hx melanoma.   States she is able to take Tylenol without problem.   Planning shoulder surgery in March.  GYNECOLOGIC HISTORY: Patient's last menstrual period was 11/03/1980 (approximate). Contraception:  Hyst Menopausal hormone therapy:  none Last mammogram:  06-07-20 3D/Neg/BiRads1--knows to schedule Last pap smear: 2002 Neg        OB History     Gravida  1   Para  1   Term  1   Preterm  0   AB  0   Living  1      SAB  0   IAB  0   Ectopic  0   Multiple  0   Live Births  1              Patient Active Problem List   Diagnosis Date Noted   Medication side effects 08/25/2019   Fatigue 06/01/2019   PAF (paroxysmal atrial fibrillation) ([place]) 05/12/2019   Chronic anticoagulation 05/12/2019   Essential hypertension 05/12/2019   Hypothyroidism 05/12/2019   Lumbosacral spondylosis without myelopathy 01/23/2017   S/P shoulder replacement 02/01/2016   Cervical spondylosis with myelopathy 10/03/2014   Spondylosis of cervical joint 07/07/2012   Chronic pain 02/10/2012   Rotator cuff tear arthropathy of both shoulders 02/10/2012   Lumbar spondylosis 02/10/2012    Past Medical History:  Diagnosis Date   Asthmatic bronchitis    Atrial fibrillation ([place]) 2019   Cataracts, bilateral    immature   Chronic back pain    arthritis.Stenosis   Dysphagia    Dysrhythmia    (?or extra beats) treated /w atenolol   Emphysema lung ([facility])    left   GERD (gastroesophageal reflux disease)    takes Omeprazole daily   Hard of hearing    wears hearing aides    History of blood transfusion    no abnormal reaction noted   History of gastric ulcer     Hypercholesteremia    takes Atorvastatin daily   Hypertension    takes Coreg and Losartan daily   Hypothyroidism    takes Synthroid daily   IBS (irritable bowel syndrome)    takes Librax daily   Joint pain    Lumbosacral spondylosis without myelopathy    Melanoma ([place])    on left leg, wide excision    Melanoma ([place]) 2022   abdomen   Muscle spasm    takes Robaxin daily as needed   Neuromuscular disorder ([place])    esophageal spasms at times    Osteoarthritis    Peripheral edema    take HCTZ daily   Pneumonia    hx of.Many yrs ago   Sarcoidosis of 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/2001   developed into pneumonia, resolved within a year   Seasonal allergies    takes Allegra daily and uses Flonase daily as needed   Shortness of breath dyspnea    with exertion   Venous stasis    Yeast infection    given Diflucan today    Past Surgical History:  Procedure Laterality Date   ABDOMINAL HYSTERECTOMY  1982   BSO   ANTERIOR CERVICAL DECOMP/DISCECTOMY FUSION  N/A 10/03/2014   Procedure: ANTERIOR CERVICAL DECOMPRESSION/DISCECTOMY FUSION 1 LEVEL,CERVICAL THREE-FOUR;  Surgeon: [name], MD;  Location: [facility];  Service: Neurosurgery;  Laterality: N/A;   APPENDECTOMY  1986   along with chole   BREAST BIOPSY Right 1997   sclerosing adenosis -Dr. Lucia Gaskins   CARDIOVERSION N/A 04/29/2019   Procedure: CARDIOVERSION;  Surgeon: Lelon Perla, MD;  Location: [facility] END[facility]COPY;  Service: Cardiovascular;  Laterality: N/A;   CHOLECYSTECTOMY     COLONOSCOPY     ESOPHAGOGASTRODUODENOSCOPY     with dilitation   LIPOMA EXCISION Left 10/1999   left thigh   lumbosacral cyst  2005   seen on injection for low back pain    LUNG BIOPSY  05/2001   /w Dr.Burney- bronchoscopy, mediastinoscopy   MELANOMA EXCISION     REVERSE SHOULDER ARTHROPLASTY Left 02/01/2016   Procedure: LEFT REVERSE TOTAL SHOULDER ARTHROPLASTY;  Surgeon: [name], MD;  Location: [facility];  Service: Orthopedics;  Laterality: Left;   rotator cuff   Bilateral 1996/1998   repair   ROTATOR CUFF REPAIR Left 1996; 1981   ROTATOR CUFF REPAIR Right 1996   TONSILLECTOMY     TOTAL HIP ARTHROPLASTY Left 10/2000   TOTAL HIP ARTHROPLASTY Right 06/2005   TOTAL HIP ARTHROPLASTY Left    10/2000   VAGINAL DELIVERY     x1    Current Outpatient Medications  Medication Sig Dispense Refill   acetaminophen (TYLENOL) 325 MG tablet (500mg ) 1/2 tab     albuterol (PROVENTIL HFA;VENTOLIN HFA) 108 (90 Base) MCG/ACT inhaler Inhale 2 puffs into the lungs every 6 (six) hours as needed for wheezing or shortness of breath.     apixaban (ELIQUIS) 2.5 MG TABS tablet TAKE 1 TABLET BY MOUTH 2 TIMES DAILY. 30 tablet 0   atorvastatin (LIPITOR) 20 MG tablet Take 20 mg by mouth daily.     Calcium Carbonate-Vitamin D (CALCIUM 600+D PO) Take 1 tablet by mouth daily.     carvedilol (COREG) 6.25 MG tablet Take 1 tablet (6.25 mg total) by mouth 2 (two) times daily with a meal. 180 tablet 0   cholecalciferol (VITAMIN D) 1000 UNITS tablet Take 1,000 Units by mouth daily.     clidinium-chlordiazePOXIDE (LIBRAX) 5-2.5 MG capsule 1 capsule before meals     diazepam (VALIUM) 5 MG tablet Take 2.5 mg by mouth at bedtime as needed for sedation.      docusate sodium (COLACE) 100 MG capsule 3 capsules     Eflornithine HCl (VANIQA) 13.9 % cream Vaniqa 13.9 % topical cream  APPLY TO AFFECTED AREA TWICE A DAY AS DIRECTED     fluorometholone (FML) 0.1 % ophthalmic suspension Place 1 drop into the left eye 4 (four) times daily.     fluticasone (FLONASE) 50 MCG/ACT nasal spray Place 2 sprays into both nostrils daily.     furosemide (LASIX) 20 MG tablet Take 20 mg by mouth as needed.      hyoscyamine (LEVSIN SL) 0.125 MG SL tablet Place 0.125 mg under the tongue every 4 (four) hours as needed for cramping.      ketoconazole (NIZORAL) 2 % shampoo      levothyroxine (SYNTHROID, LEVOTHROID) 112 MCG tablet Take 112 mcg by mouth daily before breakfast.     LINZESS 72 MCG capsule Take 72 mcg by  mouth every morning.     losartan (COZAAR) 100 MG tablet Take 1 tablet (100 mg total) by mouth daily. 30 tablet 0   naphazoline-pheniramine (NAPHCON-A) 0.025-0.3 % ophthalmic solution  Place 1 drop into both eyes 4 (four) times daily as needed for eye irritation.     omeprazole (PRILOSEC) 40 MG capsule Take 40 mg by mouth at bedtime.      prednisoLONE acetate (PRED FORTE) 1 % ophthalmic suspension Place 1 drop into the left eye 4 (four) times daily.     Probiotic Product (ALIGN) 4 MG CAPS See admin instructions.     sertraline (ZOLOFT) 50 MG tablet Take 50 mg by mouth daily.     sulfamethoxazole-trimethoprim (BACTRIM DS) 800-160 MG tablet Take 1 tablet by mouth 2 (two) times daily. Take for one week. 14 tablet 0   triamcinolone cream (KENALOG) 0.1 % USE AS DIRECTED     No current facility-administered medications for this visit.     ALLERGIES: Acetaminophen, Codeine, Pseudoephedrine hcl, Ultram [tramadol], Vicodin [hydrocodone-acetaminophen], Dalmane [flurazepam hcl], Nortriptyline, Propoxyphene, Cephalexin, Clarithromycin, Levofloxacin, Other, and Vibramycin [doxycycline calcium]  Family History  Problem Relation Age of Onset   Stroke Mother    Heart attack Father    Diabetes Brother    Heart disease Brother     Social History   Socioeconomic History   Marital status: Married    Spouse name: Not on file   Number of children: Not on file   Years of education: Not on file   Highest education level: Not on file  Occupational History   Not on file  Tobacco Use   Smoking status: Former    Packs/day: 1.00    Years: 15.00    Pack years: 15.00    Types: Cigarettes   Smokeless tobacco: Never   Tobacco comments:    quit smoking in 1985  Vaping Use   Vaping Use: Never used  Substance and Sexual Activity   Alcohol use: Yes    Alcohol/week: 3.0 standard drinks    Types: 3 Glasses of wine per week   Drug use: No   Sexual activity: Not Currently    Partners: Male    Birth  control/protection: Surgical    Comment: hysterectomy  Other Topics Concern   Not on file  Social History Narrative   Not on file   Social Determinants of Health   Financial Resource Strain: Not on file  Food Insecurity: Not on file  Transportation Needs: Not on file  Physical Activity: Not on file  Stress: Not on file  Social Connections: Not on file  Intimate Partner Violence: Not on file    Review of Systems  Genitourinary:  Positive for vaginal pain (right vulvar lesion).  All other systems reviewed and are negative.  PHYSICAL EXAMINATION:    BP (!) 158/82    Pulse 83    LMP 11/03/1980 (Approximate)    SpO2 94%     General appearance: alert, cooperative and appears stated age   Pelvic: External genitalia:  6 - 7 mm abscess of right labia minora.  Starting to drain.              Urethra:  normal appearing urethra with no masses, tenderness or lesions              Bartholins and Skenes: normal                 Vagina: atrophy noted.               Cervix: absent                Bimanual Exam:  Uterus:  absent  Adnexa: no mass, fullness, tenderness           Chaperone was present for exam:  Estill Bamberg, CMA  ASSESSMENT  Small vulvar abscess.  Status post TAH/BSO.  Atrial fibrillation.  On Eliquis.   PLAN  Start Bactrim DS po bid x 7 days. Warm wet compresses.  Return if area does not improve.   An After Visit Summary was printed and given to the patient.  20 min  total time was spent for this patient encounter, including preparation, face-to-face counseling with the patient, coordination of care, and documentation of the encounter.

## 2021-11-27 ENCOUNTER — Ambulatory Visit: Payer: Medicare Other | Admitting: Physician Assistant

## 2021-12-16 NOTE — Progress Notes (Signed)
Surgery orders requested via Epic inbox. °

## 2021-12-17 NOTE — H&P (Signed)
Patient's anticipated LOS is less than 2 midnights, meeting these requirements: - Younger than 48 - Lives within 1 hour of care - Has a competent adult at home to recover with post-op recover - NO history of  - Chronic pain requiring opiods  - Diabetes  - Coronary Artery Disease  - Heart failure  - Heart attack  - Stroke  - DVT/VTE  - Cardiac arrhythmia  - Respiratory Failure/COPD  - Renal failure  - Anemia  - Advanced Liver disease     Jenna [name] is an 88 y.o. female.    Chief Complaint: right shoulder pain  HPI: Pt is a 88 y.o. female complaining of right shoulder pain for multiple years. Pain had continually increased since the beginning. X-rays in the clinic show end-stage arthritic changes of the right shoulder. Pt has tried various conservative treatments which have failed to alleviate their symptoms, including injections and therapy. Various options are discussed with the patient. Risks, benefits and expectations were discussed with the patient. Patient understand the risks, benefits and expectations and wishes to proceed with surgery.   PCP:  [name], MD  D/C Plans: Home  PMH: Past Medical History:  Diagnosis Date   Asthmatic bronchitis    Atrial fibrillation ([place]) 2019   Cataracts, bilateral    immature   Chronic back pain    arthritis.Stenosis   Dysphagia    Dysrhythmia    (?or extra beats) treated /w atenolol   Emphysema lung ([facility])    left   GERD (gastroesophageal reflux disease)    takes Omeprazole daily   Hard of hearing    wears hearing aides    History of blood transfusion    no abnormal reaction noted   History of gastric ulcer    Hypercholesteremia    takes Atorvastatin daily   Hypertension    takes Coreg and Losartan daily   Hypothyroidism    takes Synthroid daily   IBS (irritable bowel syndrome)    takes Librax daily   Joint pain    Lumbosacral spondylosis without myelopathy    Melanoma ([place])    on left leg, wide  excision    Melanoma ([place]) 2022   abdomen   Muscle spasm    takes Robaxin daily as needed   Neuromuscular disorder ([place])    esophageal spasms at times    Osteoarthritis    Peripheral edema    take HCTZ daily   Pneumonia    hx of.Many yrs ago   Sarcoidosis of lung ([place]) 05/2001   developed into pneumonia, resolved within a year   Seasonal allergies    takes Allegra daily and uses Flonase daily as needed   Shortness of breath dyspnea    with exertion   Venous stasis    Yeast infection    given Diflucan today    PSH: Past Surgical History:  Procedure Laterality Date   ABDOMINAL HYSTERECTOMY  1982   BSO   ANTERIOR CERVICAL DECOMP/DISCECTOMY FUSION N/A 10/03/2014   Procedure: ANTERIOR CERVICAL DECOMPRESSION/DISCECTOMY FUSION 1 LEVEL,CERVICAL THREE-FOUR;  Surgeon: [name], MD;  Location: [facility];  Service: Neurosurgery;  Laterality: N/A;   APPENDECTOMY  1986   along with chole   BREAST BIOPSY Right 1997   sclerosing adenosis -Dr. Lucia Gaskins   CARDIOVERSION N/A 04/29/2019   Procedure: CARDIOVERSION;  Surgeon: Lelon Perla, MD;  Location[facility];  Service: Cardiovascular;  Laterality: N/A;   CHOLECYSTECTOMY     COLONOSCOPY     ESOPHAGOGASTRODUODENOSCOPY  with dilitation   LIPOMA EXCISION Left 10/1999   left thigh   lumbosacral cyst  2005   seen on injection for low back pain    LUNG BIOPSY  05/2001   /w Dr.Burney- bronchoscopy, mediastinoscopy   MELANOMA EXCISION     REVERSE SHOULDER ARTHROPLASTY Left 02/01/2016   Procedure: LEFT REVERSE TOTAL SHOULDER ARTHROPLASTY;  Surgeon: [name], MD;  Location: [facility];  Service: Orthopedics;  Laterality: Left;   rotator cuff  Bilateral 1996/1998   repair   ROTATOR CUFF REPAIR Left 1996; 1981   ROTATOR CUFF REPAIR Right 1996   TONSILLECTOMY     TOTAL HIP ARTHROPLASTY Left 10/2000   TOTAL HIP ARTHROPLASTY Right 06/2005   TOTAL HIP ARTHROPLASTY Left    10/2000   VAGINAL DELIVERY     x1    Social History:   reports that she has quit smoking. Her smoking use included cigarettes. She has a 15.00 pack-year smoking history. She has never used smokeless tobacco. She reports current alcohol use of about 3.0 standard drinks per week. She reports that she does not use drugs.  Allergies:  Allergies  Allergen Reactions   Acetaminophen Shortness Of Breath, Palpitations and Other (See Comments)   Codeine Shortness Of Breath   Pseudoephedrine Hcl Shortness Of Breath   Ultram [Tramadol] Shortness Of Breath   Vicodin [Hydrocodone-Acetaminophen] Shortness Of Breath   Dalmane [Flurazepam Hcl] Other (See Comments)    confusion   Nortriptyline     Unknown reaction    Propoxyphene Other (See Comments)   Cephalexin Nausea Only   Clarithromycin Nausea Only   Levofloxacin Anxiety   Other Other (See Comments) and Rash   Vibramycin [Doxycycline Calcium] Rash    Medications: No current facility-administered medications for this encounter.   Current Outpatient Medications  Medication Sig Dispense Refill   acetaminophen (TYLENOL) 325 MG tablet (500mg ) 1/2 tab     albuterol (PROVENTIL HFA;VENTOLIN HFA) 108 (90 Base) MCG/ACT inhaler Inhale 2 puffs into the lungs every 6 (six) hours as needed for wheezing or shortness of breath.     apixaban (ELIQUIS) 2.5 MG TABS tablet TAKE 1 TABLET BY MOUTH 2 TIMES DAILY. 30 tablet 0   atorvastatin (LIPITOR) 20 MG tablet Take 20 mg by mouth daily.     Calcium Carbonate-Vitamin D (CALCIUM 600+D PO) Take 1 tablet by mouth daily.     carvedilol (COREG) 6.25 MG tablet Take 1 tablet (6.25 mg total) by mouth 2 (two) times daily with a meal. 180 tablet 0   cholecalciferol (VITAMIN D) 1000 UNITS tablet Take 1,000 Units by mouth daily.     clidinium-chlordiazePOXIDE (LIBRAX) 5-2.5 MG capsule 1 capsule before meals     diazepam (VALIUM) 5 MG tablet Take 2.5 mg by mouth at bedtime as needed for sedation.      docusate sodium (COLACE) 100 MG capsule 3 capsules     Eflornithine HCl  (VANIQA) 13.9 % cream Vaniqa 13.9 % topical cream  APPLY TO AFFECTED AREA TWICE A DAY AS DIRECTED     fluorometholone (FML) 0.1 % ophthalmic suspension Place 1 drop into the left eye 4 (four) times daily.     fluticasone (FLONASE) 50 MCG/ACT nasal spray Place 2 sprays into both nostrils daily.     furosemide (LASIX) 20 MG tablet Take 20 mg by mouth as needed.      hyoscyamine (LEVSIN SL) 0.125 MG SL tablet Place 0.125 mg under the tongue every 4 (four) hours as needed for cramping.  ketoconazole (NIZORAL) 2 % shampoo      levothyroxine (SYNTHROID, LEVOTHROID) 112 MCG tablet Take 112 mcg by mouth daily before breakfast.     LINZESS 72 MCG capsule Take 72 mcg by mouth every morning.     losartan (COZAAR) 100 MG tablet Take 1 tablet (100 mg total) by mouth daily. 30 tablet 0   naphazoline-pheniramine (NAPHCON-A) 0.025-0.3 % ophthalmic solution Place 1 drop into both eyes 4 (four) times daily as needed for eye irritation.     omeprazole (PRILOSEC) 40 MG capsule Take 40 mg by mouth at bedtime.      prednisoLONE acetate (PRED FORTE) 1 % ophthalmic suspension Place 1 drop into the left eye 4 (four) times daily.     Probiotic Product (ALIGN) 4 MG CAPS See admin instructions.     sertraline (ZOLOFT) 50 MG tablet Take 50 mg by mouth daily.     sulfamethoxazole-trimethoprim (BACTRIM DS) 800-160 MG tablet Take 1 tablet by mouth 2 (two) times daily. Take for one week. 14 tablet 0   triamcinolone cream (KENALOG) 0.1 % USE AS DIRECTED      No results found for this or any previous visit (from the past 48 hour(s)). No results found.  ROS: Pain with rom of the right upper extremity  Physical Exam: Alert and oriented 88 y.o. female in no acute distress Cranial nerves 2-12 intact Cervical spine: full rom with no tenderness, nv intact distally Chest: active breath sounds bilaterally, no wheeze rhonchi or rales Heart: regular rate and rhythm, no murmur Abd: non tender non distended with active bowel  sounds Hip is stable with rom  Right shoulder painful and weak rom Nv intact distally No rashes or edema distally   Assessment/Plan Assessment: right shoulder cuff arthropathy  Plan:  Patient will undergo a right reverse total shoulder by Dr. Veverly Fells at [place] Risks benefits and expectations were discussed with the patient. Patient understand risks, benefits and expectations and wishes to proceed. Preoperative templating of the joint replacement has been completed, documented, and submitted to the Operating Room personnel in order to optimize intra-operative equipment management.   Merla Riches PA-C, MPAS [facility] is now [employer] [address], [place], [place], [place] [address] Phone: [phone] [email or web address]   [employer]

## 2021-12-19 NOTE — Progress Notes (Addendum)
Anesthesia Review:  PCP: DR [name]  Cardiologist : PT was supposed dto have cardiac appt with PA on 11/27/21.  Appt never happened.  Nex tappt is for 01/28/22.  Chest x-ray : EKG : 04/18/21  Echo : 2020  Stress test: Cardiac Cath :  Activity level: can do a flight of stairs without difficulty  Sleep Study/ CPAP : none  Fasting Blood Sugar :      / Checks Blood Sugar -- times a day:   Blood Thinner/ Instructions /Last Dose: ASA / Instructions/ Last Dose :  Elliquis  - pt has not yet received preop instructions from cardiology or surgeon.  PT instructed to call cardiology and/or surgeon office for clearance for surgery.  Spoke with pt on 12/24/21 and pt stated she was going to call cardiology on 12/24/21 in regards to eliquis preop instructions.

## 2021-12-19 NOTE — Progress Notes (Signed)
COVID TEST ON 12/31/21.      COME THRU MAIN ENTRQANCE AT [facility] [place].  HAVE A SEAT IN THE LOBBY ON THE RIGHT AS YOU COME THRU THE DOOR.  CALL [phone] AND LET THEM KNOW YOU ARE HERE FOR COVID TESTING .            Your procedure is scheduled on:        01/03/22.    Report to [facility] Main  Entrance   Report to admitting at 0700AM  0700   Call this number if you have problems the morning of surgery [phone]    REMEMBER: NO  SOLID FOOD CANDY OR GUM AFTER MIDNIGHT. CLEAR LIQUIDS UNTIL   0630AM         . NOTHING BY MOUTH EXCEPT CLEAR LIQUIDS UNTIL   0630AM   . PLEASE FINISH ENSURE DRINK PER SURGEON ORDER  WHICH NEEDS TO BE COMPLETED AT   [id number]   .      CLEAR LIQUID DIET   Foods Allowed                                                                    Coffee and tea, regular and decaf                            Fruit ices (not with fruit pulp)                                      Iced Popsicles                                    Carbonated beverages, regular and diet                                    Cranberry, grape and apple juices Sports drinks like Gatorade Lightly seasoned clear broth or consume(fat free) Sugar, honey syrup ___________________________________________________________________      BRUSH YOUR TEETH MORNING OF SURGERY AND RINSE YOUR MOUTH OUT, NO CHEWING GUM CANDY OR MINTS.     Take these medicines the morning of surgery with A SIP OF WATER:  COREG, SYNTHROID, EYE DROPS AS USUAL ZOLOFT   DO NOT TAKE ANY DIABETIC MEDICATIONS DAY OF YOUR SURGERY                               You may not have any metal on your body including hair pins and              piercings  Do not wear jewelry, make-up, lotions, powders or perfumes, deodorant             Do not wear nail polish on your fingernails.  Do not shave  48 hours prior to surgery.              Men may shave face and neck.   Do not bring valuables to the  hospital. [facility] IS NOT              RESPONSIBLE   FOR VALUABLES.  Contacts, dentures or bridgework may not be worn into surgery.  Leave suitcase in the car. After surgery it may be brought to your room.     Patients discharged the day of surgery will not be allowed to drive home. IF YOU ARE HAVING SURGERY AND GOING HOME THE SAME DAY, YOU MUST HAVE AN ADULT TO DRIVE YOU HOME AND BE WITH YOU FOR 24 HOURS. YOU MAY GO HOME BY TAXI OR UBER OR ORTHERWISE, BUT AN ADULT MUST ACCOMPANY YOU HOME AND STAY WITH YOU FOR 24 HOURS.  Name and phone number of your driver:  Special Instructions: N/A              Please read over the following fact sheets you were given: _____________________________________________________________________  [facility] - Preparing for Surgery Before surgery, you can play an important role.  Because skin is not sterile, your skin needs to be as free of germs as possible.  You can reduce the number of germs on your skin by washing with CHG (chlorahexidine gluconate) soap before surgery.  CHG is an antiseptic cleaner which kills germs and bonds with the skin to continue killing germs even after washing. Please DO NOT use if you have an allergy to CHG or antibacterial soaps.  If your skin becomes reddened/irritated stop using the CHG and inform your nurse when you arrive at Short Stay. Do not shave (including legs and underarms) for at least 48 hours prior to the first CHG shower.  You may shave your face/neck. Please follow these instructions carefully:  1.  Shower with CHG Soap the night before surgery and the  morning of Surgery.  2.  If you choose to wash your hair, wash your hair first as usual with your  normal  shampoo.  3.  After you shampoo, rinse your hair and body thoroughly to remove the  shampoo.                           4.  Use CHG as you would any other liquid soap.  You can apply chg directly  to the skin and wash                       Gently with a scrungie or clean washcloth.  5.  Apply the CHG Soap  to your body ONLY FROM THE NECK DOWN.   Do not use on face/ open                           Wound or open sores. Avoid contact with eyes, ears mouth and genitals (private parts).                       Wash face,  Genitals (private parts) with your normal soap.             6.  Wash thoroughly, paying special attention to the area where your surgery  will be performed.  7.  Thoroughly rinse your body with warm water from the neck down.  8.  DO NOT shower/wash with your normal soap after using and rinsing off  the CHG Soap.                9.  Pat yourself  dry with a clean towel.            10.  Wear clean pajamas.            11.  Place clean sheets on your bed the night of your first shower and do not  sleep with pets. Day of Surgery : Do not apply any lotions/deodorants the morning of surgery.  Please wear clean clothes to the hospital/surgery center.  FAILURE TO FOLLOW THESE INSTRUCTIONS MAY RESULT IN THE CANCELLATION OF YOUR SURGERY PATIENT SIGNATURE_________________________________  NURSE SIGNATURE__________________________________  ________________________________________________________________________

## 2021-12-23 ENCOUNTER — Encounter (HOSPITAL_COMMUNITY)
Admission: RE | Admit: 2021-12-23 | Discharge: 2021-12-23 | Disposition: A | Discharge status: Home / Self Care | Payer: Medicare Other | Source: Ambulatory Visit | Attending: Orthopedic Surgery | Admitting: Orthopedic Surgery

## 2021-12-23 ENCOUNTER — Encounter (HOSPITAL_COMMUNITY): Payer: Self-pay

## 2021-12-23 ENCOUNTER — Other Ambulatory Visit: Payer: Self-pay

## 2021-12-23 VITALS — BP 150/65 | HR 66 | Temp 98.6°F | Resp 16 | Ht 62.0 in | Wt 122.5 lb

## 2021-12-23 DIAGNOSIS — J439 Emphysema, unspecified: Secondary | ICD-10-CM | POA: Insufficient documentation

## 2021-12-23 DIAGNOSIS — M19011 Primary osteoarthritis, right shoulder: Secondary | ICD-10-CM | POA: Insufficient documentation

## 2021-12-23 DIAGNOSIS — I4891 Unspecified atrial fibrillation: Secondary | ICD-10-CM | POA: Insufficient documentation

## 2021-12-23 DIAGNOSIS — K219 Gastro-esophageal reflux disease without esophagitis: Secondary | ICD-10-CM | POA: Diagnosis not present

## 2021-12-23 DIAGNOSIS — Z01812 Encounter for preprocedural laboratory examination: Secondary | ICD-10-CM | POA: Insufficient documentation

## 2021-12-23 DIAGNOSIS — I10 Essential (primary) hypertension: Secondary | ICD-10-CM | POA: Diagnosis not present

## 2021-12-23 DIAGNOSIS — Z87891 Personal history of nicotine dependence: Secondary | ICD-10-CM | POA: Insufficient documentation

## 2021-12-23 DIAGNOSIS — Z01818 Encounter for other preprocedural examination: Secondary | ICD-10-CM

## 2021-12-23 DIAGNOSIS — Z20822 Contact with and (suspected) exposure to covid-19: Secondary | ICD-10-CM | POA: Diagnosis not present

## 2021-12-23 HISTORY — DX: Dyspnea, unspecified: R06.00

## 2021-12-23 LAB — CBC
HCT: 34.8 % — ABNORMAL LOW (ref 36.0–46.0)
Hemoglobin: 11.6 g/dL — ABNORMAL LOW (ref 12.0–15.0)
MCH: 33.6 pg (ref 26.0–34.0)
MCHC: 33.3 g/dL (ref 30.0–36.0)
MCV: 100.9 fL — ABNORMAL HIGH (ref 80.0–100.0)
Platelets: 266 10*3/uL (ref 150–400)
RBC: 3.45 MIL/uL — ABNORMAL LOW (ref 3.87–5.11)
RDW: 12.6 % (ref 11.5–15.5)
WBC: 4.7 10*3/uL (ref 4.0–10.5)
nRBC: 0 % (ref 0.0–0.2)

## 2021-12-23 LAB — BASIC METABOLIC PANEL
Anion gap: 5 (ref 5–15)
BUN: 13 mg/dL (ref 8–23)
CO2: 28 mmol/L (ref 22–32)
Calcium: 9 mg/dL (ref 8.9–10.3)
Chloride: 99 mmol/L (ref 98–111)
Creatinine, Ser: 0.57 mg/dL (ref 0.44–1.00)
GFR, Estimated: >60 mL/min (ref >60)
Glucose, Bld: 102 mg/dL — ABNORMAL HIGH (ref 70–99)
Potassium: 4 mmol/L (ref 3.5–5.1)
Sodium: 132 mmol/L — ABNORMAL LOW (ref 135–145)

## 2021-12-23 LAB — SURGICAL PCR SCREEN
MRSA, PCR: NEGATIVE
Staphylococcus aureus: NEGATIVE

## 2021-12-23 NOTE — Progress Notes (Signed)
[facility]- Preparing for Total Shoulder Arthroplasty  °  °Before surgery, you can play an important role. Because skin is not sterile, your skin needs to be as free of germs as possible. You can reduce the number of germs on your skin by using the following products. °Benzoyl Peroxide Gel °Reduces the number of germs present on the skin °Applied twice a day to shoulder area starting two days before surgery   ° °================================================================== ° °Please follow these instructions carefully: ° °BENZOYL PEROXIDE 5% GEL ° °Please do not use if you have an allergy to benzoyl peroxide.   If your skin becomes reddened/irritated stop using the benzoyl peroxide. ° °Starting two days before surgery, apply as follows: °Apply benzoyl peroxide in the morning and at night. Apply after taking a shower. If you are not taking a shower clean entire shoulder front, back, and side along with the armpit with a clean wet washcloth. ° °Place a quarter-sized dollop on your shoulder and rub in thoroughly, making sure to cover the front, back, and side of your shoulder, along with the armpit.  ° °2 days before ____ AM   ____ PM              1 day before ____ AM   ____ PM °                        °Do this twice a day for two days.  (Last application is the night before surgery, AFTER using the CHG soap as described below). ° °Do NOT apply benzoyl peroxide gel on the day of surgery.  °

## 2021-12-24 ENCOUNTER — Telehealth: Payer: Self-pay | Admitting: Cardiology

## 2021-12-24 NOTE — Telephone Encounter (Signed)
Called patient no answer.No voice mail. 

## 2021-12-24 NOTE — Telephone Encounter (Signed)
PT is stating that she is needing to know how many days to hold her Eliquis prior to surgery.. edu pt that she would be cleared at her appt on 03/28.[name] pt states that her surgery is on 03/03. Edu pt that she has not yet been cleared for surgery due to her missing her original preop appt back in Jan. Pt appt not until 03/28 next available appt not until 03/03 which is the day of the patients surgery. Pt says that her PCP did an EKG and everything came back fine, she will have this faxed over to [place] if necessary. Will the pt still need to be seen for clearance or is she ok based on PCP's EKG... and also how long will pt need to hold meds. Please advise

## 2021-12-24 NOTE — Telephone Encounter (Signed)
Spoke to patient.  RN informed patient she has not been seen by cardiology since 04/2021.  She cancelled her appointment 10/2021 due to sickness at that time the cardiac clearance would have been address.   Patient states she had apportionment with  Primary- Dr Mancel Bale 11/25/21  Ekg was done  and cleared from a medical standpoint.   RN explained to patient that her last visit with cardiology was 04/2021  , she may need an appointment for clearance and  instruction on how to manage Eliquis. Lab -BMP,CBC has recently been obtained  12/23/21.    Surgery schedule for 01/03/22 with Dr [name]  ( [place])   Aware will defer to  [name]  and Pre op  pool for  further instructions.  RN called and requested  office note  and EKG from Dr Mancel Bale office awaiting for information .

## 2021-12-24 NOTE — Progress Notes (Signed)
Anesthesia Chart Review   Case: [id number] Date/Time: 01/03/22 6387   Procedure: REVERSE SHOULDER ARTHROPLASTY (Right: Shoulder) - NO ISB   Anesthesia type: General   Pre-op diagnosis: right shoulder osteoarthritis   Location: [facility]   Surgeons: [name], MD       DISCUSSION:88 y.o. former smoker with h/o GERD, HTN, emphysema, atrial fibrillation, right shoulder OA scheduled for above procedure 01/03/22 with Dr. [name].   Per cardiology, "OK to clear for surgery. Still in Normal rhythm . Can hold Eliquis for 2 days prior to surgery."  Anticipate pt can proceed with planned procedure barring acute status change.   VS: BP (!) 150/65    Pulse 66    Temp 37 C (Oral)    Resp 16    Ht 5\' 2"  (1.575 m)    Wt 55.6 kg    LMP 11/03/1980 (Approximate)    SpO2 98%    BMI 22.41 kg/m   PROVIDERS: [name], MD is PCP   Martinique, Peter, MD is Cardiologist  LABS: Labs reviewed: Acceptable for surgery. (all labs ordered are listed, but only abnormal results are displayed)  Labs Reviewed  CBC - Abnormal; Notable for the following components:      Result Value   RBC 3.45 (*)    Hemoglobin 11.6 (*)    HCT 34.8 (*)    MCV 100.9 (*)    All other components within normal limits  BASIC METABOLIC PANEL - Abnormal; Notable for the following components:   Sodium 132 (*)    Glucose, Bld 102 (*)    All other components within normal limits  SURGICAL PCR SCREEN     IMAGES:   EKG: 04/18/2021 Rate 67 bpm  NSR  CV: Echo 01/12/2019 1. The left ventricle has mildly reduced systolic function, with an  ejection fraction of 45-50%. The cavity size was normal. Left ventricular  diastolic function could not be evaluated secondary to atrial  fibrillation. Left ventrical global hypokinesis  without regional wall motion abnormalities.   2. The right ventricle has normal systolic function. The cavity was  normal. There is no increase in right ventricular wall thickness.   3.  Left atrial size was moderately dilated.   4. The aortic valve is tricuspid Moderate thickening of the aortic valve  Moderate calcification of the aortic valve. Aortic valve regurgitation was  not assessed by color flow Doppler.  Past Medical History:  Diagnosis Date   Asthmatic bronchitis    Atrial fibrillation ([place]) 2019   Cataracts, bilateral    immature   Chronic back pain    arthritis.Stenosis   Dysphagia    Dyspnea    with exertion   Dysrhythmia    (?or extra beats) treated /w atenolol   Emphysema lung ([facility])    left   GERD (gastroesophageal reflux disease)    takes Omeprazole daily   Hard of hearing    wears hearing aides    History of blood transfusion    no abnormal reaction noted   History of gastric ulcer    Hypercholesteremia    takes Atorvastatin daily   Hypertension    takes Coreg and Losartan daily   Hypothyroidism    takes Synthroid daily   IBS (irritable bowel syndrome)    takes Librax daily   Joint pain    Lumbosacral spondylosis without myelopathy    Melanoma ([place])    on left leg, wide excision    Melanoma ([place]) 2022  abdomen   Muscle spasm    takes Robaxin daily as needed   Neuromuscular disorder ([place])    esophageal spasms at times    Osteoarthritis    Peripheral edema    take HCTZ daily   Pneumonia    hx of.Many yrs ago   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   developed into pneumonia, resolved within a year   Seasonal allergies    takes Allegra daily and uses Flonase daily as needed   Venous stasis    Yeast infection    given Diflucan today    Past Surgical History:  Procedure Laterality Date   ABDOMINAL HYSTERECTOMY  1982   BSO   ANTERIOR CERVICAL DECOMP/DISCECTOMY FUSION N/A 10/03/2014   Procedure: ANTERIOR CERVICAL DECOMPRESSION/DISCECTOMY FUSION 1 LEVEL,CERVICAL THREE-FOUR;  Surgeon: [name], MD;  Location: [facility];  Service: Neurosurgery;  Laterality: N/A;   APPENDECTOMY  1986   along with chole   BREAST BIOPSY Right  1997   sclerosing adenosis -Dr. Lucia Gaskins   CARDIOVERSION N/A 04/29/2019   Procedure: CARDIOVERSION;  Surgeon: Lelon Perla, MD;  Location: [facility] ENDOSCOPY;  Service: Cardiovascular;  Laterality: N/A;   CHOLECYSTECTOMY     COLONOSCOPY     ESOPHAGOGASTRODUODENOSCOPY     with dilitation   LIPOMA EXCISION Left 10/1999   left thigh   lumbosacral cyst  2005   seen on injection for low back pain    LUNG BIOPSY  05/2001   /w Dr.Burney- bronchoscopy, mediastinoscopy   MELANOMA EXCISION     REVERSE SHOULDER ARTHROPLASTY Left 02/01/2016   Procedure: LEFT REVERSE TOTAL SHOULDER ARTHROPLASTY;  Surgeon: [name], MD;  Location: [facility];  Service: Orthopedics;  Laterality: Left;   rotator cuff  Bilateral 1996/1998   repair   ROTATOR CUFF REPAIR Left 1996; 1981   ROTATOR CUFF REPAIR Right 1996   TONSILLECTOMY     TOTAL HIP ARTHROPLASTY Left 10/2000   TOTAL HIP ARTHROPLASTY Right 06/2005   TOTAL HIP ARTHROPLASTY Left    10/2000   VAGINAL DELIVERY     x1    MEDICATIONS:  acetaminophen (TYLENOL) 500 MG tablet   albuterol (PROVENTIL HFA;VENTOLIN HFA) 108 (90 Base) MCG/ACT inhaler   apixaban (ELIQUIS) 2.5 MG TABS tablet   atorvastatin (LIPITOR) 20 MG tablet   Biotin 5000 MCG TABS   Calcium Carbonate-Vitamin D (CALCIUM 600+D PO)   carvedilol (COREG) 6.25 MG tablet   cholecalciferol (VITAMIN D) 1000 UNITS tablet   clidinium-chlordiazePOXIDE (LIBRAX) 5-2.5 MG capsule   diazepam (VALIUM) 5 MG tablet   fluticasone (FLONASE) 50 MCG/ACT nasal spray   furosemide (LASIX) 20 MG tablet   hyoscyamine (LEVSIN SL) 0.125 MG SL tablet   levothyroxine (SYNTHROID, LEVOTHROID) 112 MCG tablet   LINZESS 72 MCG capsule   losartan (COZAAR) 100 MG tablet   naphazoline-pheniramine (NAPHCON-A) 0.025-0.3 % ophthalmic solution   omeprazole (PRILOSEC) 40 MG capsule   Probiotic Product (ALIGN) 4 MG CAPS   sertraline (ZOLOFT) 50 MG tablet   sulfamethoxazole-trimethoprim (BACTRIM DS) 800-160 MG tablet   Turmeric 500 MG  CAPS   No current facility-administered medications for this encounter.      Konrad Felix Ward, PA-C [facility] [phone]

## 2021-12-24 NOTE — Telephone Encounter (Signed)
Patient's crcl is 56 ml/min  Ok to hold Eliquis for 2 days prior to procedure.

## 2021-12-24 NOTE — Telephone Encounter (Signed)
OK to clear for surgery. Still in Normal rhythm  Can hold Eliquis for 2 days prior to surgery  Dacey Milberger Martinique MD, [facility]

## 2021-12-25 NOTE — Telephone Encounter (Signed)
° °  Patient Name: Jenna [name]  DOB: [birth year] MRN: [id number]  Primary Cardiologist: Peter Martinique, MD  Chart reviewed as part of pre-operative protocol coverage. Patient was contacted as part of preoperative screening process. No answer.  Left voicemail for patient to call back.   [name], NP 12/25/2021, 8:18 AM

## 2021-12-25 NOTE — Telephone Encounter (Signed)
° °  Name: Jenna [name]  DOB: 01-13-1933  MRN: [id number]   Primary Cardiologist: Peter Martinique, MD  Chart reviewed as part of pre-operative protocol coverage. Patient was contacted 12/25/2021 in reference to pre-operative risk assessment for pending surgery as outlined below.  Jenna [name] was last seen on 04/18/2021 by Almyra Deforest, PA.  Since that day, Jenna [name] has done well from a cardiac standpoint. She denies any new or concerning symptoms.  According to the Revised Cardiac Risk Index (RCRI), her perioperative risk of major cardiac event is 0.4%. Her METs are 4.73 according to the Duke Activity Status Index (DASI). Her activity is somewhat limited due to chronic orthopedic issues.Therefore, based on ACC/AHA guidelines, the patient would be at acceptable risk for the planned procedure without further cardiovascular testing.   The patient was advised that if she develops new symptoms prior to surgery to contact our office to arrange for a follow-up visit, and she verbalized understanding.  Patient with diagnosis of A Fib on Eliquis for anticoagulation.     Procedure: Right reverse total shoulder  Date of procedure: 01/03/22    CHA2DS2-VASc Score = 4  This indicates a 4.8% annual risk of stroke. The patient's score is based upon: CHF History: 0 HTN History: 1 Diabetes History: 0 Stroke History: 0 Vascular Disease History: 0 Age Score: 2 Gender Score: 1  Patient's crcl is 56 ml/min   Ok to hold Eliquis for 2 days prior to procedure.  Please resume Eliquis as soon as possible postprocedure, at the discretion of the surgeon.  I will route this recommendation to the requesting party via Epic fax function and remove from pre-op pool. Please call with questions.  [name], NP 12/25/2021, 11:32 AM

## 2021-12-25 NOTE — Telephone Encounter (Addendum)
Preop team is managing this clearance dated in phone note 11/01/21 and will incorporate Dr. Doug Sou recommendations noted below into the clearance. Diona Browner NP tried to call patient this AM to discuss finalized recommendations. Will close out this duplicate clearance message from the preop inbox; preop team will continue to follow to finalize recommendation as soon as we hear back from patient. Malachy Mood - we will notify the patient so no need to try to call again. Thanks!

## 2021-12-31 ENCOUNTER — Encounter (HOSPITAL_COMMUNITY)
Admission: RE | Admit: 2021-12-31 | Discharge: 2021-12-31 | Disposition: A | Discharge status: Home / Self Care | Payer: Medicare Other | Source: Ambulatory Visit | Attending: Orthopedic Surgery | Admitting: Orthopedic Surgery

## 2021-12-31 ENCOUNTER — Other Ambulatory Visit: Payer: Self-pay

## 2021-12-31 DIAGNOSIS — Z20822 Contact with and (suspected) exposure to covid-19: Secondary | ICD-10-CM | POA: Insufficient documentation

## 2021-12-31 DIAGNOSIS — Z01818 Encounter for other preprocedural examination: Secondary | ICD-10-CM

## 2021-12-31 DIAGNOSIS — Z01812 Encounter for preprocedural laboratory examination: Secondary | ICD-10-CM | POA: Diagnosis present

## 2022-01-01 LAB — SARS CORONAVIRUS 2 (TAT 6-24 HRS): SARS Coronavirus 2: NEGATIVE

## 2022-01-02 NOTE — Anesthesia Preprocedure Evaluation (Addendum)
Anesthesia Evaluation  ?Patient identified by MRN, date of birth, ID band ?Patient awake ? ? ? ?Reviewed: ?Allergy & Precautions, NPO status , Patient's Chart, lab work & pertinent test results ? ?Airway ?Mallampati: I ? ? ? ? ? ? Dental ?no notable dental hx. ? ?  ?Pulmonary ?former smoker,  ?  ?Pulmonary exam normal ? ? ? ? ? ? ? Cardiovascular ?hypertension, Pt. on medications and Pt. on home beta blockers ?Normal cardiovascular exam ? ? ?  ?Neuro/Psych ?Anxiety   ? GI/Hepatic ?GERD  Medicated,  ?Endo/Other  ?Hypothyroidism  ? Renal/GU ?  ? ?  ?Musculoskeletal ? ?(+) Arthritis , Osteoarthritis,   ? Abdominal ?Normal abdominal exam  (+)   ?Peds ? Hematology ?  ?Anesthesia Other Findings ? ?IMPRESSIONS  ? ? ??1. The left ventricle has mildly reduced systolic function, with an  ?ejection fraction of 45-50%. The cavity size was normal. Left ventricular  ?diastolic function could not be evaluated secondary to atrial  ?fibrillation. Left ventrical global hypokinesis  ?without regional wall motion abnormalities.  ??2. The right ventricle has normal systolic function. The cavity was  ?normal. There is no increase in right ventricular wall thickness.  ??3. Left atrial size was moderately dilated.  ??4. The aortic valve is tricuspid Moderate thickening of the aortic valve  ?Moderate calcification of the aortic valve. Aortic valve regurgitation was  ?not assessed by color flow Doppler.  ? ? Reproductive/Obstetrics ? ?  ? ? ? ? ? ? ? ? ? ? ? ? ? ?  ?  ? ? ? ? ? ? ? ?Anesthesia Physical ?Anesthesia Plan ? ?ASA: 2 ? ?Anesthesia Plan: General  ? ?Post-op Pain Management: Regional block*  ? ?Induction: Intravenous ? ?PONV Risk Score and Plan: 3 and Ondansetron ? ?Airway Management Planned: Oral ETT ? ?Additional Equipment: None ? ?Intra-op Plan:  ? ?Post-operative Plan: Extubation in OR ? ?Informed Consent: I have reviewed the patients History and Physical, chart, labs and discussed the  procedure including the risks, benefits and alternatives for the proposed anesthesia with the patient or authorized representative who has indicated his/her understanding and acceptance.  ? ? ? ?Dental advisory given ? ?Plan Discussed with: CRNA ? ?Anesthesia Plan Comments:   ? ? ? ? ? ?Anesthesia Quick Evaluation ? ?

## 2022-01-03 ENCOUNTER — Ambulatory Visit (HOSPITAL_BASED_OUTPATIENT_CLINIC_OR_DEPARTMENT_OTHER): Payer: Medicare Other | Admitting: Certified Registered"

## 2022-01-03 ENCOUNTER — Encounter (HOSPITAL_COMMUNITY)
Admission: RE | Disposition: A | Discharge status: Home / Self Care | Payer: Self-pay | Source: Home / Self Care | Attending: Orthopedic Surgery

## 2022-01-03 ENCOUNTER — Encounter (HOSPITAL_COMMUNITY): Payer: Self-pay | Admitting: Orthopedic Surgery

## 2022-01-03 ENCOUNTER — Observation Stay (HOSPITAL_COMMUNITY): Payer: Medicare Other

## 2022-01-03 ENCOUNTER — Other Ambulatory Visit: Payer: Self-pay

## 2022-01-03 ENCOUNTER — Observation Stay (HOSPITAL_COMMUNITY)
Admission: RE | Admit: 2022-01-03 | Discharge: 2022-01-04 | Disposition: A | Discharge status: Home / Self Care | Payer: Medicare Other | Attending: Orthopedic Surgery | Admitting: Orthopedic Surgery

## 2022-01-03 ENCOUNTER — Ambulatory Visit (HOSPITAL_COMMUNITY): Payer: Medicare Other | Admitting: Physician Assistant

## 2022-01-03 DIAGNOSIS — Z96611 Presence of right artificial shoulder joint: Secondary | ICD-10-CM

## 2022-01-03 DIAGNOSIS — I4891 Unspecified atrial fibrillation: Secondary | ICD-10-CM | POA: Diagnosis not present

## 2022-01-03 DIAGNOSIS — Z87891 Personal history of nicotine dependence: Secondary | ICD-10-CM | POA: Diagnosis not present

## 2022-01-03 DIAGNOSIS — Z96643 Presence of artificial hip joint, bilateral: Secondary | ICD-10-CM | POA: Insufficient documentation

## 2022-01-03 DIAGNOSIS — I10 Essential (primary) hypertension: Secondary | ICD-10-CM | POA: Insufficient documentation

## 2022-01-03 DIAGNOSIS — Z79899 Other long term (current) drug therapy: Secondary | ICD-10-CM | POA: Diagnosis not present

## 2022-01-03 DIAGNOSIS — E039 Hypothyroidism, unspecified: Secondary | ICD-10-CM | POA: Insufficient documentation

## 2022-01-03 DIAGNOSIS — M19011 Primary osteoarthritis, right shoulder: Secondary | ICD-10-CM

## 2022-01-03 DIAGNOSIS — Z7901 Long term (current) use of anticoagulants: Secondary | ICD-10-CM | POA: Insufficient documentation

## 2022-01-03 DIAGNOSIS — Z85828 Personal history of other malignant neoplasm of skin: Secondary | ICD-10-CM | POA: Insufficient documentation

## 2022-01-03 DIAGNOSIS — M75101 Unspecified rotator cuff tear or rupture of right shoulder, not specified as traumatic: Principal | ICD-10-CM | POA: Insufficient documentation

## 2022-01-03 HISTORY — PX: REVERSE SHOULDER ARTHROPLASTY: SHX5054

## 2022-01-03 SURGERY — ARTHROPLASTY, SHOULDER, TOTAL, REVERSE
Anesthesia: General | Site: Shoulder | Laterality: Right

## 2022-01-03 MED ORDER — BUPIVACAINE-EPINEPHRINE (PF) 0.25% -1:200000 IJ SOLN
INTRAMUSCULAR | Status: AC
  Filled 2022-01-03: qty 30

## 2022-01-03 MED ORDER — SUCCINYLCHOLINE CHLORIDE 200 MG/10ML IV SOSY
PREFILLED_SYRINGE | INTRAVENOUS | Status: AC
  Filled 2022-01-03: qty 10

## 2022-01-03 MED ORDER — FENTANYL CITRATE (PF) 250 MCG/5ML IJ SOLN
INTRAMUSCULAR | Status: DC | PRN
  Administered 2022-01-03: 50 ug via INTRAVENOUS

## 2022-01-03 MED ORDER — PHENOL 1.4 % MT LIQD: 1.0000 | OROMUCOSAL | Status: DC | PRN

## 2022-01-03 MED ORDER — BISACODYL 10 MG RE SUPP: 10.0000 mg | Freq: Every day | RECTAL | Status: DC | PRN

## 2022-01-03 MED ORDER — LINACLOTIDE 72 MCG PO CAPS
72.0000 ug | ORAL_CAPSULE | Freq: Every morning | ORAL | Status: DC
  Administered 2022-01-04: 72 ug via ORAL
  Filled 2022-01-03: qty 1

## 2022-01-03 MED ORDER — BUPIVACAINE HCL (PF) 0.25 % IJ SOLN
INTRAMUSCULAR | Status: DC | PRN
  Administered 2022-01-03 (×4): 5 mL via PERINEURAL

## 2022-01-03 MED ORDER — OXYCODONE HCL 5 MG PO TABS
5.0000 mg | ORAL_TABLET | ORAL | Status: DC | PRN
  Administered 2022-01-04 (×3): 5 mg via ORAL
  Filled 2022-01-03 (×3): qty 1

## 2022-01-03 MED ORDER — PANTOPRAZOLE SODIUM 40 MG PO TBEC
40.0000 mg | DELAYED_RELEASE_TABLET | Freq: Every day | ORAL | Status: DC
  Administered 2022-01-04: 40 mg via ORAL
  Filled 2022-01-03: qty 1

## 2022-01-03 MED ORDER — SULFAMETHOXAZOLE-TRIMETHOPRIM 800-160 MG PO TABS
1.0000 | ORAL_TABLET | Freq: Two times a day (BID) | ORAL | Status: DC
  Filled 2022-01-03: qty 1

## 2022-01-03 MED ORDER — ONDANSETRON HCL 4 MG/2ML IJ SOLN: 4.0000 mg | Freq: Four times a day (QID) | INTRAMUSCULAR | Status: DC | PRN

## 2022-01-03 MED ORDER — SUCCINYLCHOLINE CHLORIDE 200 MG/10ML IV SOSY
PREFILLED_SYRINGE | INTRAVENOUS | Status: DC | PRN
  Administered 2022-01-03: 120 mg via INTRAVENOUS

## 2022-01-03 MED ORDER — HYOSCYAMINE SULFATE 0.125 MG SL SUBL
0.1250 mg | SUBLINGUAL_TABLET | SUBLINGUAL | Status: DC | PRN
  Filled 2022-01-03: qty 1

## 2022-01-03 MED ORDER — BUPIVACAINE-EPINEPHRINE (PF) 0.25% -1:200000 IJ SOLN
INTRAMUSCULAR | Status: DC | PRN
  Administered 2022-01-03: 5 mL

## 2022-01-03 MED ORDER — LIDOCAINE HCL (PF) 2 % IJ SOLN
INTRAMUSCULAR | Status: AC
  Filled 2022-01-03: qty 5

## 2022-01-03 MED ORDER — LACTATED RINGERS IV SOLN: INTRAVENOUS | Status: DC

## 2022-01-03 MED ORDER — PHENYLEPHRINE HCL-NACL 20-0.9 MG/250ML-% IV SOLN
INTRAVENOUS | Status: DC | PRN
  Administered 2022-01-03: 50 ug/min via INTRAVENOUS

## 2022-01-03 MED ORDER — HYDROMORPHONE HCL 1 MG/ML IJ SOLN: 0.5000 mg | INTRAMUSCULAR | Status: DC | PRN

## 2022-01-03 MED ORDER — LEVOTHYROXINE SODIUM 112 MCG PO TABS
112.0000 ug | ORAL_TABLET | Freq: Every day | ORAL | Status: DC
  Administered 2022-01-04: 112 ug via ORAL
  Filled 2022-01-03: qty 1

## 2022-01-03 MED ORDER — SERTRALINE HCL 50 MG PO TABS
50.0000 mg | ORAL_TABLET | Freq: Every day | ORAL | Status: DC
Start: 2022-01-04 — End: 2022-01-04
  Administered 2022-01-04: 50 mg via ORAL
  Filled 2022-01-03: qty 1

## 2022-01-03 MED ORDER — METOCLOPRAMIDE HCL 5 MG PO TABS: 5.0000 mg | ORAL_TABLET | Freq: Three times a day (TID) | ORAL | Status: DC | PRN

## 2022-01-03 MED ORDER — CLONIDINE HCL (ANALGESIA) 100 MCG/ML EP SOLN
EPIDURAL | Status: DC | PRN
Start: 2022-01-03 — End: 2022-01-03
  Administered 2022-01-03: 100 ug

## 2022-01-03 MED ORDER — NAPHAZOLINE-PHENIRAMINE 0.025-0.3 % OP SOLN
1.0000 [drp] | Freq: Four times a day (QID) | OPHTHALMIC | Status: DC | PRN
  Filled 2022-01-03: qty 15

## 2022-01-03 MED ORDER — PROPOFOL 10 MG/ML IV BOLUS
INTRAVENOUS | Status: DC | PRN
  Administered 2022-01-03: 150 mg via INTRAVENOUS

## 2022-01-03 MED ORDER — BIOTIN 5000 MCG PO TABS: 5000.0000 ug | ORAL_TABLET | Freq: Every day | ORAL | Status: DC

## 2022-01-03 MED ORDER — PROPOFOL 10 MG/ML IV BOLUS
INTRAVENOUS | Status: AC
  Filled 2022-01-03: qty 20

## 2022-01-03 MED ORDER — MENTHOL 3 MG MT LOZG: 1.0000 | LOZENGE | OROMUCOSAL | Status: DC | PRN

## 2022-01-03 MED ORDER — CILIDINIUM-CHLORDIAZEPOXIDE 2.5-5 MG PO CAPS
1.0000 | ORAL_CAPSULE | Freq: Two times a day (BID) | ORAL | Status: DC
  Administered 2022-01-03 – 2022-01-04 (×2): 1 via ORAL
  Filled 2022-01-03 (×3): qty 1

## 2022-01-03 MED ORDER — PHENYLEPHRINE 40 MCG/ML (10ML) SYRINGE FOR IV PUSH (FOR BLOOD PRESSURE SUPPORT)
PREFILLED_SYRINGE | INTRAVENOUS | Status: DC | PRN
  Administered 2022-01-03: 80 ug via INTRAVENOUS

## 2022-01-03 MED ORDER — ALBUTEROL SULFATE (2.5 MG/3ML) 0.083% IN NEBU: 2.5000 mg | INHALATION_SOLUTION | Freq: Four times a day (QID) | RESPIRATORY_TRACT | Status: DC | PRN

## 2022-01-03 MED ORDER — OYSTER SHELL CALCIUM/D3 500-5 MG-MCG PO TABS
1.0000 | ORAL_TABLET | Freq: Every day | ORAL | Status: DC
Start: 2022-01-04 — End: 2022-01-04
  Administered 2022-01-04: 1 via ORAL
  Filled 2022-01-03: qty 1

## 2022-01-03 MED ORDER — LOSARTAN POTASSIUM 50 MG PO TABS
100.0000 mg | ORAL_TABLET | Freq: Every day | ORAL | Status: DC
  Administered 2022-01-03 – 2022-01-04 (×2): 100 mg via ORAL
  Filled 2022-01-03 (×2): qty 2

## 2022-01-03 MED ORDER — DOCUSATE SODIUM 100 MG PO CAPS
100.0000 mg | ORAL_CAPSULE | Freq: Two times a day (BID) | ORAL | Status: DC
  Administered 2022-01-03 – 2022-01-04 (×2): 100 mg via ORAL
  Filled 2022-01-03 (×2): qty 1

## 2022-01-03 MED ORDER — VITAMIN D 25 MCG (1000 UNIT) PO TABS
1000.0000 [IU] | ORAL_TABLET | Freq: Every day | ORAL | Status: DC
  Administered 2022-01-04: 1000 [IU] via ORAL
  Filled 2022-01-03 (×2): qty 1

## 2022-01-03 MED ORDER — CEFAZOLIN SODIUM-DEXTROSE 1-4 GM/50ML-% IV SOLN
1.0000 g | Freq: Four times a day (QID) | INTRAVENOUS | Status: AC
  Administered 2022-01-03 – 2022-01-04 (×3): 1 g via INTRAVENOUS
  Filled 2022-01-03 (×3): qty 50

## 2022-01-03 MED ORDER — PHENYLEPHRINE HCL (PRESSORS) 10 MG/ML IV SOLN
INTRAVENOUS | Status: AC
  Filled 2022-01-03: qty 1

## 2022-01-03 MED ORDER — FLUTICASONE PROPIONATE 50 MCG/ACT NA SUSP: 2.0000 | Freq: Every day | NASAL | Status: DC | PRN

## 2022-01-03 MED ORDER — LIDOCAINE 2% (20 MG/ML) 5 ML SYRINGE
INTRAMUSCULAR | Status: DC | PRN
  Administered 2022-01-03: 60 mg via INTRAVENOUS

## 2022-01-03 MED ORDER — FENTANYL CITRATE (PF) 100 MCG/2ML IJ SOLN
INTRAMUSCULAR | Status: AC
  Filled 2022-01-03: qty 2

## 2022-01-03 MED ORDER — ONDANSETRON HCL 4 MG/2ML IJ SOLN: 4.0000 mg | Freq: Once | INTRAMUSCULAR | Status: DC | PRN

## 2022-01-03 MED ORDER — DEXAMETHASONE SODIUM PHOSPHATE 10 MG/ML IJ SOLN
INTRAMUSCULAR | Status: DC | PRN
  Administered 2022-01-03: 10 mg

## 2022-01-03 MED ORDER — APIXABAN 2.5 MG PO TABS
2.5000 mg | ORAL_TABLET | Freq: Two times a day (BID) | ORAL | Status: DC
  Administered 2022-01-04: 2.5 mg via ORAL
  Filled 2022-01-03: qty 1

## 2022-01-03 MED ORDER — CHLORHEXIDINE GLUCONATE 0.12 % MT SOLN
15.0000 mL | Freq: Once | OROMUCOSAL | Status: AC
  Administered 2022-01-03: 15 mL via OROMUCOSAL

## 2022-01-03 MED ORDER — FENTANYL CITRATE PF 50 MCG/ML IJ SOSY
50.0000 ug | PREFILLED_SYRINGE | INTRAMUSCULAR | Status: DC
  Administered 2022-01-03 (×2): 50 ug via INTRAVENOUS
  Filled 2022-01-03: qty 2

## 2022-01-03 MED ORDER — ORAL CARE MOUTH RINSE: 15.0000 mL | Freq: Once | OROMUCOSAL | Status: AC

## 2022-01-03 MED ORDER — FENTANYL CITRATE PF 50 MCG/ML IJ SOSY: 25.0000 ug | PREFILLED_SYRINGE | INTRAMUSCULAR | Status: DC | PRN

## 2022-01-03 MED ORDER — DIAZEPAM 5 MG PO TABS: 2.5000 mg | ORAL_TABLET | Freq: Every evening | ORAL | Status: DC | PRN

## 2022-01-03 MED ORDER — OXYCODONE HCL 5 MG PO TABS: 5.0000 mg | ORAL_TABLET | Freq: Four times a day (QID) | ORAL | 0 refills | Status: DC | PRN

## 2022-01-03 MED ORDER — FUROSEMIDE 20 MG PO TABS: 20.0000 mg | ORAL_TABLET | Freq: Every day | ORAL | Status: DC | PRN

## 2022-01-03 MED ORDER — CEFAZOLIN SODIUM-DEXTROSE 2-4 GM/100ML-% IV SOLN
2.0000 g | INTRAVENOUS | Status: AC
  Administered 2022-01-03: 2 g via INTRAVENOUS
  Filled 2022-01-03: qty 100

## 2022-01-03 MED ORDER — RISAQUAD PO CAPS
1.0000 | ORAL_CAPSULE | Freq: Every day | ORAL | Status: DC
  Administered 2022-01-04: 1 via ORAL
  Filled 2022-01-03: qty 1

## 2022-01-03 MED ORDER — SODIUM CHLORIDE 0.9 % IV SOLN: INTRAVENOUS | Status: DC

## 2022-01-03 MED ORDER — MIDAZOLAM HCL 2 MG/2ML IJ SOLN
INTRAMUSCULAR | Status: AC
  Administered 2022-01-03: 1 mg
  Filled 2022-01-03: qty 2

## 2022-01-03 MED ORDER — CARVEDILOL 6.25 MG PO TABS
6.2500 mg | ORAL_TABLET | Freq: Two times a day (BID) | ORAL | Status: DC
  Administered 2022-01-03 – 2022-01-04 (×2): 6.25 mg via ORAL
  Filled 2022-01-03 (×2): qty 1

## 2022-01-03 MED ORDER — ONDANSETRON HCL 4 MG/2ML IJ SOLN
INTRAMUSCULAR | Status: DC | PRN
  Administered 2022-01-03: 4 mg via INTRAVENOUS

## 2022-01-03 MED ORDER — ONDANSETRON HCL 4 MG PO TABS: 4.0000 mg | ORAL_TABLET | Freq: Four times a day (QID) | ORAL | Status: DC | PRN

## 2022-01-03 MED ORDER — ACETAMINOPHEN 500 MG PO TABS
500.0000 mg | ORAL_TABLET | ORAL | Status: DC | PRN
  Administered 2022-01-03: 1000 mg via ORAL
  Administered 2022-01-04: 500 mg via ORAL
  Filled 2022-01-03: qty 2
  Filled 2022-01-03: qty 1

## 2022-01-03 MED ORDER — METOCLOPRAMIDE HCL 5 MG/ML IJ SOLN: 5.0000 mg | Freq: Three times a day (TID) | INTRAMUSCULAR | Status: DC | PRN

## 2022-01-03 MED ORDER — SODIUM CHLORIDE 0.9 % IR SOLN
Status: DC | PRN
  Administered 2022-01-03: 1000 mL

## 2022-01-03 MED ORDER — TURMERIC 500 MG PO CAPS: 500.0000 mg | ORAL_CAPSULE | Freq: Every day | ORAL | Status: DC

## 2022-01-03 MED ORDER — ATORVASTATIN CALCIUM 20 MG PO TABS
20.0000 mg | ORAL_TABLET | Freq: Every day | ORAL | Status: DC
  Administered 2022-01-04: 20 mg via ORAL
  Filled 2022-01-03 (×2): qty 1

## 2022-01-03 MED ORDER — POLYETHYLENE GLYCOL 3350 17 G PO PACK: 17.0000 g | PACK | Freq: Every day | ORAL | Status: DC | PRN

## 2022-01-03 SURGICAL SUPPLY — 77 items
AID PSTN UNV HD RSTRNT DISP (MISCELLANEOUS) ×1
BAG COUNTER SPONGE SURGICOUNT (BAG) ×1 IMPLANT
BAG SPEC THK2 15X12 ZIP CLS (MISCELLANEOUS) ×1
BAG SPNG CNTER NS LX DISP (BAG) ×1
BAG ZIPLOCK 12X15 (MISCELLANEOUS) ×1 IMPLANT
BIT DRILL 1.6MX128 (BIT) IMPLANT
BIT DRILL 170X2.5X (BIT) IMPLANT
BIT DRL 170X2.5X (BIT) ×1
BLADE SAG 18X100X1.27 (BLADE) ×2 IMPLANT
COVER BACK TABLE 60X90IN (DRAPES) ×2 IMPLANT
COVER SURGICAL LIGHT HANDLE (MISCELLANEOUS) ×2 IMPLANT
DRAPE INCISE IOBAN 66X45 STRL (DRAPES) ×2 IMPLANT
DRAPE ORTHO SPLIT 77X108 STRL (DRAPES) ×4
DRAPE SHEET LG 3/4 BI-LAMINATE (DRAPES) ×2 IMPLANT
DRAPE SURG ORHT 6 SPLT 77X108 (DRAPES) ×2 IMPLANT
DRAPE TOP 10253 STERILE (DRAPES) ×2 IMPLANT
DRAPE U-SHAPE 47X51 STRL (DRAPES) ×2 IMPLANT
DRILL 2.5 (BIT) ×2
DRSG ADAPTIC 3X8 NADH LF (GAUZE/BANDAGES/DRESSINGS) ×2 IMPLANT
DRSG PAD ABDOMINAL 8X10 ST (GAUZE/BANDAGES/DRESSINGS) ×2 IMPLANT
DURAPREP 26ML APPLICATOR (WOUND CARE) ×2 IMPLANT
ECCENTRIC EPIPHYSI MODULAR SZ1 (Trauma) IMPLANT
ELECT BLADE TIP CTD 4 INCH (ELECTRODE) ×2 IMPLANT
ELECT NDL TIP 2.8 STRL (NEEDLE) ×1 IMPLANT
ELECT NEEDLE TIP 2.8 STRL (NEEDLE) ×2 IMPLANT
ELECT REM PT RETURN 15FT ADLT (MISCELLANEOUS) ×2 IMPLANT
FACESHIELD WRAPAROUND (MASK) ×2 IMPLANT
FACESHIELD WRAPAROUND OR TEAM (MASK) ×1 IMPLANT
GAUZE SPONGE 4X4 12PLY STRL (GAUZE/BANDAGES/DRESSINGS) ×3 IMPLANT
GLENOSPHERE DELTA XTEND LAT 38 (Miscellaneous) ×1 IMPLANT
GLOVE SURG ORTHO LTX SZ7.5 (GLOVE) ×2 IMPLANT
GLOVE SURG ORTHO LTX SZ8.5 (GLOVE) ×2 IMPLANT
GLOVE SURG UNDER POLY LF SZ7.5 (GLOVE) ×2 IMPLANT
GLOVE SURG UNDER POLY LF SZ8.5 (GLOVE) ×2 IMPLANT
GOWN STRL REUS W/TWL XL LVL3 (GOWN DISPOSABLE) ×4 IMPLANT
KIT BASIN OR (CUSTOM PROCEDURE TRAY) ×2 IMPLANT
KIT TURNOVER KIT A (KITS) IMPLANT
MANIFOLD NEPTUNE II (INSTRUMENTS) ×2 IMPLANT
METAGLENE DELTA EXTEND (Trauma) IMPLANT
METAGLENE DXTEND (Trauma) ×2 IMPLANT
MODULAR ECCENTRIC EPIPHYSI SZ1 (Trauma) ×2 IMPLANT
NDL MAYO CATGUT SZ4 TPR NDL (NEEDLE) IMPLANT
NEEDLE MAYO CATGUT SZ4 (NEEDLE) IMPLANT
NS IRRIG 1000ML POUR BTL (IV SOLUTION) ×2 IMPLANT
PACK SHOULDER (CUSTOM PROCEDURE TRAY) ×2 IMPLANT
PIN GUIDE 1.2 (PIN) ×1 IMPLANT
PIN GUIDE GLENOPHERE 1.5MX300M (PIN) ×1 IMPLANT
PIN METAGLENE 2.5 (PIN) ×1 IMPLANT
PROTECTOR NERVE ULNAR (MISCELLANEOUS) ×2 IMPLANT
RESTRAINT HEAD UNIVERSAL NS (MISCELLANEOUS) ×2 IMPLANT
SCREW 4.5X18MM (Screw) ×2 IMPLANT
SCREW 4.5X24MM (Screw) ×2 IMPLANT
SCREW 4.5X36MM (Screw) ×1 IMPLANT
SCREW BN 18X4.5XSTRL SHLDR (Screw) IMPLANT
SCREW BN 24X4.5XLCK STRL (Screw) IMPLANT
SLING ARM FOAM STRAP LRG (SOFTGOODS) IMPLANT
SLING ARM FOAM STRAP MED (SOFTGOODS) ×1 IMPLANT
SMARTMIX MINI TOWER (MISCELLANEOUS)
SPACER 38 PLUS 3 (Spacer) ×1 IMPLANT
SPIKE FLUID TRANSFER (MISCELLANEOUS) ×1 IMPLANT
SPONGE T-LAP 18X18 ~~LOC~~+RFID (SPONGE) ×2 IMPLANT
SPONGE T-LAP 4X18 ~~LOC~~+RFID (SPONGE) ×2 IMPLANT
STEM HUMERAL SZ8 STANDARD (Stem) ×2 IMPLANT
STEM HUMERAL SZ8 STD (Stem) IMPLANT
STRIP CLOSURE SKIN 1/2X4 (GAUZE/BANDAGES/DRESSINGS) ×2 IMPLANT
SUCTION FRAZIER HANDLE 10FR (MISCELLANEOUS)
SUCTION TUBE FRAZIER 10FR DISP (MISCELLANEOUS) ×1 IMPLANT
SUT FIBERWIRE #2 38 T-5 BLUE (SUTURE) ×4
SUT MNCRL AB 4-0 PS2 18 (SUTURE) ×2 IMPLANT
SUT VIC AB 0 CT1 36 (SUTURE) ×4 IMPLANT
SUT VIC AB 0 CT2 27 (SUTURE) ×2 IMPLANT
SUT VIC AB 2-0 CT1 27 (SUTURE) ×2
SUT VIC AB 2-0 CT1 TAPERPNT 27 (SUTURE) ×1 IMPLANT
SUTURE FIBERWR #2 38 T-5 BLUE (SUTURE) ×2 IMPLANT
TAPE CLOTH SURG 6X10 WHT LF (GAUZE/BANDAGES/DRESSINGS) ×1 IMPLANT
TOWEL OR 17X26 10 PK STRL BLUE (TOWEL DISPOSABLE) ×2 IMPLANT
TOWER SMARTMIX MINI (MISCELLANEOUS) IMPLANT

## 2022-01-03 NOTE — Anesthesia Postprocedure Evaluation (Signed)
Anesthesia Post Note ? ?Patient: KATHYE CIPRIANI ? ?Procedure(s) Performed: REVERSE SHOULDER ARTHROPLASTY (Right: Shoulder) ? ?  ? ?Patient location during evaluation: PACU ?Anesthesia Type: General ?Level of consciousness: awake ?Pain management: pain level controlled ?Vital Signs Assessment: post-procedure vital signs reviewed and stable ?Respiratory status: spontaneous breathing ?Cardiovascular status: stable ?Postop Assessment: no apparent nausea or vomiting ?Anesthetic complications: no ? ? ?No notable events documented. ? ?Last Vitals:  ?Vitals:  ? 01/03/22 1300 01/03/22 1315  ?BP:  126/60  ?Pulse: 65 64  ?Resp: 13 14  ?Temp:  36.7 ?C  ?SpO2: 98% 98%  ?  ?Last Pain:  ?Vitals:  ? 01/03/22 1315  ?TempSrc: Axillary  ?PainSc: 0-No pain  ? ? ?  ?  ?  ?  ?  ?  ? ?Huston Foley ? ? ? ? ?

## 2022-01-03 NOTE — Transfer of Care (Signed)
Immediate Anesthesia Transfer of Care Note ? ?Patient: Jenna [name] ? ?Procedure(s) Performed: REVERSE SHOULDER ARTHROPLASTY (Right: Shoulder) ? ?Patient Location: PACU ? ?Anesthesia Type:GA combined with regional for post-op pain ? ?Level of Consciousness: awake and drowsy ? ?Airway & Oxygen Therapy: Patient Spontanous Breathing and Patient connected to face mask oxygen ? ?Post-op Assessment: Report given to RN and Post -op Vital signs reviewed and stable ? ?Post vital signs: Reviewed and stable ? ?Last Vitals:  ?Vitals Value Taken Time  ?BP 149/57 01/03/22 1123  ?Temp    ?Pulse 72 01/03/22 1124  ?Resp 13 01/03/22 1124  ?SpO2 100 % 01/03/22 1124  ?Vitals shown include unvalidated device data. ? ?Last Pain:  ?Vitals:  ? 01/03/22 0901  ?TempSrc:   ?PainSc: Asleep  ?   ? ?  ? ?Complications: No notable events documented. ?

## 2022-01-03 NOTE — Op Note (Signed)
NAME: Jenna [name], Jenna B. ?MEDICAL RECORD NO: [id number] ?ACCOUNT NO: [id number] ?DATE OF BIRTH: 12-04-1932 ?FACILITY: [facility] ?LOCATION: [facility] ?PHYSICIAN: Doran Heater. Veverly Fells, MD ? ?Operative Report  ? ?DATE OF PROCEDURE: 01/03/2022 ? ?PREOPERATIVE DIAGNOSIS:  Right shoulder rotator cuff tear arthropathy, end-stage. ? ?POSTOPERATIVE DIAGNOSIS:  Right shoulder rotator cuff tear arthropathy, end-stage. ? ?PROCEDURE PERFORMED:  Right reverse total shoulder replacement using DePuy Delta Xtend prosthesis with no subscap repair. ? ?ATTENDING SURGEON:  Doran Heater. Veverly Fells, MD. ? ?ASSISTANT:  Darol Destine, [place], who was scrubbed during the entire procedure, and necessary for satisfactory completion of surgery. ? ?General anesthesia was used plus interscalene block. ? ?ESTIMATED BLOOD LOSS:  150 mL. ? ?FLUIDS:  1500 mL crystalloid. ? ?Instrument counts correct.  No complications.  Perioperative antibiotics were given. ? ?INDICATIONS:  The patient is an 88 year old female with worsening right shoulder pain and dysfunction secondary to rotator cuff insufficiency and end-stage arthritis.  The patient has failed an extended period of conservative management, desires  ?operative treatment to relieve pain and restore function.  Informed consent obtained. ? ?DESCRIPTION OF PROCEDURE:  After an adequate level of general anesthesia was achieved, 2+ interscalene block, the patient was positioned in modified beach chair position.  Timeout was called, verifying correct patient, correct site.  Sterile prep and  ?drape of the shoulder and arm performed.  We then entered the shoulder using a standard deltopectoral approach, starting at the coracoid process and extending down to the anterior humerus.  Dissection down through subcutaneous tissues using Bovie.  We  ?identified the cephalic vein and we took that laterally with the deltoid, pectoralis taken medially.  Conjoined tendon identified and retracted medially.  We then tenodesed the biceps in  situ with 0 Vicryl figure-of-eight suture x2.  Next, we released  ?the subscapularis remnant off the lesser tuberosity and tagged for protection of the axillary nerve.  We released the inferior capsule extending the shoulder and delivering the humeral head out of the wound.  The patient had eburnated bone with no  ?cartilage on the humerus.  We entered the proximal humerus with a 6 mm reamer, reamed up to a size 8.  We then took our 8 mm T-handle guide and resected the head at 20 degrees retroversion and 155 angle.  Next, we removed the remaining osteophytes off  ?the medial humerus all the way to the back.  We then subluxed the humerus posteriorly, gaining good exposure of the glenoid.  We placed our deep retractors.  We removed our glenoid labrum and also the capsule such that we had good visualization.  We went ? in and placed our guide pin, centered low on the glenoid and protecting the axillary nerve inferiorly.  We reamed for the metaglene baseplate.  We then did our peripheral hand reaming, drilled out our central peg hole and then impacted the metaglene  ?into position, referencing off that 6 o'clock and 12 o'clock position with local anatomy.  Once we had the baseplate positioned, we placed a 36 screw at the inferior scapula with good purchase and then a 24 superiorly at the base of the coracoid.  We  ?then placed a third screw posteriorly 18 mm nonlocked.  We locked our superior and inferior screws.  The baseplate security was excellent with good bony support.  We then went ahead and secured a 38 standard glenosphere to the baseplate.  Once we had  ?that screw down, we did a finger sweep to make sure no soft tissue was caught  up in that.  Next, we went back to the humeral side.  We reamed for the one right humerus.  We then introduced the trial components, which was an 8 stem, 1 right set on the 0  ?setting and impacted in 20 degrees of retroversion.  We had good stem stability.  We went ahead and placed a  38+3 poly trial on the humeral tray and reduced the shoulder.  We were pleased with our soft tissue tensioning and stability.  We removed all  ?trial components, irrigated thoroughly and then selected a real [place]oat size 8 stem and the 1 right HA coated metaphysis.  We assembled this on the back table on the 0 setting and we used available bone graft from the humeral head and used impaction  ?grafting technique and impacted the press-fit components into place in 20 degrees of retroversion.  Once we had the stem secured, we selected the real 38+3 poly impacted on the humeral tray and reduced the shoulder, a nice little pop as it reduced and  ?good stability, nice conjoined tensioning, no gapping with inferior pole.  At this point, we irrigated thoroughly.  We resected the subscap remnant and then repaired the deltopectoral interval with 0 Vicryl suture followed by 2-0 Vicryl for subcutaneous  ?closure and 4-0 Monocryl for skin.  Steri-Strips were applied followed by sterile dressing.  The patient tolerated surgery well. ? ? ?CHR ?D: 01/03/2022 11:34:54 am T: 01/03/2022 2:14:00 pm  ?JOB: [id number]/ [id number]  ?

## 2022-01-03 NOTE — Plan of Care (Signed)

## 2022-01-03 NOTE — Discharge Instructions (Signed)
Ice to the shoulder constantly.  Keep the incision covered and clean and dry for one week, then ok to get it wet in the shower. ° °Do exercise as instructed several times per day. ° °DO NOT reach behind your back or push up out of a chair with the operative arm. ° °Use a sling while you are up and around for comfort, may remove while seated.  Keep pillow propped behind the operative elbow. ° °Follow up with Dr Dajha Urquilla in two weeks in the office, call [phone] for appt °

## 2022-01-03 NOTE — Anesthesia Procedure Notes (Signed)
Anesthesia Regional Block: Interscalene brachial plexus block  ? ?Pre-Anesthetic Checklist: , timeout performed,  Correct Patient, Correct Site, Correct Laterality,  Correct Procedure, Correct Position, site marked,  Risks and benefits discussed,  Surgical consent,  Pre-op evaluation,  At surgeon's request and post-op pain management ? ?Laterality: Right and Upper ? ?Prep: chloraprep     ?  ?Needles:  ?Injection technique: Single-shot ? ?Needle Type: Echogenic Stimulator Needle   ? ? ?Needle Length: 4cm  ?Needle Gauge: 20  ? ?Needle insertion depth: 0.5 cm ? ? ?Additional Needles: ? ? ?Procedures:,,,, ultrasound used (permanent image in chart),,    ?Narrative:  ?Start time: 01/03/2022 8:55 AM ?End time: 01/03/2022 9:04 AM ?Injection made incrementally with aspirations every 5 mL. ? ?Performed by: Personally  ?Anesthesiologist: Lyn Hollingshead, MD ? ? ? ? ?

## 2022-01-03 NOTE — Progress Notes (Signed)
Assisted Dr. Hatchett with right, ultrasound guided, interscalene  block. Side rails up, monitors on throughout procedure. See vital signs in flow sheet. Tolerated Procedure well.  

## 2022-01-03 NOTE — Interval H&P Note (Signed)
History and Physical Interval Note: ? ?01/03/2022 ?8:53 AM ? ?Jenna [name]  has presented today for surgery, with the diagnosis of right shoulder osteoarthritis.  The various methods of treatment have been discussed with the patient and family. After consideration of risks, benefits and other options for treatment, the patient has consented to  Procedure(s) with comments: ?REVERSE SHOULDER ARTHROPLASTY (Right) - NO ISB as a surgical intervention.  The patient's history has been reviewed, patient examined, no change in status, stable for surgery.  I have reviewed the patient's chart and labs.  Questions were answered to the patient's satisfaction.   ? ? ?Augustin Schooling ? ? ?

## 2022-01-03 NOTE — Anesthesia Procedure Notes (Signed)
Procedure Name: Intubation ?Date/Time: 01/03/2022 9:46 AM ?Performed by: Lollie Sails, CRNA ?Pre-anesthesia Checklist: Patient identified, Emergency Drugs available, Suction available, Patient being monitored and Timeout performed ?Patient Re-evaluated:Patient Re-evaluated prior to induction ?Oxygen Delivery Method: Circle system utilized ?Preoxygenation: Pre-oxygenation with 100% oxygen ?Induction Type: IV induction ?Ventilation: Mask ventilation without difficulty ?Laryngoscope Size: Sabra Heck and 3 ?Grade View: Grade I ?Tube type: Oral ?Tube size: 7.0 mm ?Number of attempts: 1 ?Airway Equipment and Method: Stylet ?Placement Confirmation: ETT inserted through vocal cords under direct vision, positive ETCO2 and breath sounds checked- equal and bilateral ?Secured at: 23 cm ?Tube secured with: Tape ?Dental Injury: Teeth and Oropharynx as per pre-operative assessment  ? ? ? ? ?

## 2022-01-03 NOTE — Brief Op Note (Signed)
01/03/2022 ? ?11:27 AM ? ?PATIENT:  Jenna [name]  88 y.o. female ? ?PRE-OPERATIVE DIAGNOSIS:  right shoulder osteoarthritis,  rotator cuff insufficiency ? ?POST-OPERATIVE DIAGNOSIS:  right shoulder osteoarthritis, rotator cuff insufficiency ? ?PROCEDURE:  Procedure(s) with comments: ?REVERSE SHOULDER ARTHROPLASTY (Right) - NO ISB DePuy Delta Xtend ? ?SURGEON:  Surgeon(s) and Role: ?   [name], MD - Primary ? ?PHYSICIAN ASSISTANT:  ? ?ASSISTANTS: Ventura Bruns, PA-C  ? ?ANESTHESIA:   regional and general ? ?EBL:  150 mL  ? ?BLOOD ADMINISTERED:none ? ?DRAINS: none  ? ?LOCAL MEDICATIONS USED:  MARCAINE    ? ?SPECIMEN:  No Specimen ? ?DISPOSITION OF SPECIMEN:  N/A ? ?COUNTS:  YES ? ?TOURNIQUET:  * No tourniquets in log * ? ?DICTATION: .Other Dictation: Dictation Number [id number] ? ?PLAN OF CARE: Admit for overnight observation ? ?PATIENT DISPOSITION:  PACU - hemodynamically stable. ?  ?Delay start of Pharmacological VTE agent (>24hrs) due to surgical blood loss or risk of bleeding: not applicable ? ?

## 2022-01-04 DIAGNOSIS — M75101 Unspecified rotator cuff tear or rupture of right shoulder, not specified as traumatic: Secondary | ICD-10-CM | POA: Diagnosis not present

## 2022-01-04 LAB — BASIC METABOLIC PANEL
Anion gap: 5 (ref 5–15)
BUN: 15 mg/dL (ref 8–23)
CO2: 22 mmol/L (ref 22–32)
Calcium: 8.5 mg/dL — ABNORMAL LOW (ref 8.9–10.3)
Chloride: 102 mmol/L (ref 98–111)
Creatinine, Ser: 0.55 mg/dL (ref 0.44–1.00)
GFR, Estimated: >60 mL/min (ref >60)
Glucose, Bld: 121 mg/dL — ABNORMAL HIGH (ref 70–99)
Potassium: 3.7 mmol/L (ref 3.5–5.1)
Sodium: 129 mmol/L — ABNORMAL LOW (ref 135–145)

## 2022-01-04 LAB — HEMOGLOBIN AND HEMATOCRIT, BLOOD
HCT: 29.4 % — ABNORMAL LOW (ref 36.0–46.0)
Hemoglobin: 9.8 g/dL — ABNORMAL LOW (ref 12.0–15.0)

## 2022-01-04 NOTE — Plan of Care (Signed)
?  Problem: Pain Managment: ?Goal: General experience of comfort will improve ?01/04/2022 0305 by Blase Mess, RN ?Outcome: Progressing ?01/04/2022 0305 by Blase Mess, RN ?Outcome: Progressing ?  ?Problem: Safety: ?Goal: Ability to remain free from injury will improve ?01/04/2022 0305 by Blase Mess, RN ?Outcome: Progressing ?01/04/2022 0305 by Blase Mess, RN ?Outcome: Progressing ?  ?

## 2022-01-04 NOTE — Plan of Care (Signed)
  Problem: Education: Goal: Knowledge of General Education information will improve Description: Including pain rating scale, medication(s)/side effects and non-pharmacologic comfort measures Outcome: Progressing   Problem: Activity: Goal: Risk for activity intolerance will decrease Outcome: Progressing   Problem: Pain Managment: Goal: General experience of comfort will improve Outcome: Progressing   

## 2022-01-04 NOTE — TOC Transition Note (Signed)
Transition of Care (TOC) - CM/SW Discharge Note ? ? ?Patient Details  ?Name: Jenna [name] ?MRN: [id number] ?Date of Birth: Jun 12, 1933 ? ?Transition of Care (TOC) CM/SW Contact:  ?Ross Ludwig, LCSW ?Phone Number: ?01/04/2022, 10:17 AM ? ? ?Clinical Narrative:    ? ? ?Transition of Care (TOC) Screening Note ? ? ?Patient Details  ?Name: Jenna [name] ?Date of Birth: 06/20/1933 ? ? ?Transition of Care (TOC) CM/SW Contact:    ?Ross Ludwig, LCSW ?Phone Number: ?01/04/2022, 10:17 AM ? ? ? ?Transition of Care Department [facility]) has reviewed patient and no TOC needs have been identified at this time. We will continue to monitor patient advancement through interdisciplinary progression rounds. If new patient transition needs arise, please place a TOC consult. ?  ? ? ?  ?  ? ? ?Patient Goals and CMS Choice ?  ?  ?  ? ?Discharge Placement ?  ?           ?  ?  ?  ?  ? ?Discharge Plan and Services ?  ?  ?           ?  ?  ?  ?  ?  ?  ?  ?  ?  ?  ? ?Social Determinants of Health (SDOH) Interventions ?  ? ? ?Readmission Risk Interventions ?No flowsheet data found. ? ? ? ? ?

## 2022-01-04 NOTE — Plan of Care (Signed)
  Problem: Coping: Goal: Level of anxiety will decrease Outcome: Progressing   Problem: Pain Managment: Goal: General experience of comfort will improve Outcome: Progressing   

## 2022-01-04 NOTE — Progress Notes (Signed)
? ? ?  Subjective: ? ?Patient reports pain as moderate to severe.  Denies N/V/CP/SOB.  ? ? ?Objective:  ? ?VITALS:   ?Vitals:  ? 01/03/22 1900 01/03/22 2048 01/04/22 1601 01/04/22 0932  ?BP: (!) 142/75 136/72 (!) 128/54 (!) 138/55  ?Pulse: 74 72 62 60  ?Resp:  '16 14 14  '$ ?Temp: 98.2 ?F (36.8 ?C) 98.2 ?F (36.8 ?C) 97.9 ?F (36.6 ?C) 98 ?F (36.7 ?C)  ?TempSrc: Axillary Oral Oral Oral  ?SpO2: 98% 98% 95% 97%  ?Weight:      ?Height:      ? ? ?NAD ?Sensation intact distally ?Intact pulses distally ?Incision: dressing C/D/I ?(+) AIN, PIN, U ?Dressing changed - incis c/d/I ? ? ?Lab Results  ?Component Value Date  ? WBC 4.7 12/23/2021  ? HGB 9.8 (L) 01/04/2022  ? HCT 29.4 (L) 01/04/2022  ? MCV 100.9 (H) 12/23/2021  ? PLT 266 12/23/2021  ? ?BMET ?   ?Component Value Date/Time  ? NA 129 (L) 01/04/2022 0322  ? NA 137 08/02/2019 1345  ? K 3.7 01/04/2022 0322  ? CL 102 01/04/2022 0322  ? CO2 22 01/04/2022 0322  ? GLUCOSE 121 (H) 01/04/2022 0322  ? BUN 15 01/04/2022 0322  ? BUN 9 08/02/2019 1345  ? CREATININE 0.55 01/04/2022 0322  ? CALCIUM 8.5 (L) 01/04/2022 0322  ? GFRNONAA >60 01/04/2022 0322  ? ? ? ?Assessment/Plan: ?1 Day Post-Op  ? ?Principal Problem: ?  S/P shoulder replacement, right ? ? ?NWB RUE, sling at all times ?DVT ppx:  apixaban , SCDs, TEDS ?PO pain control ?PT/OT ?Dispo: d/c home after clears OT ? ? ?Jenna [name] ?01/04/2022, 8:40 AM ? ? ?Rod Can, MD ?[phone] ?[place] [facility] [employer]  [place] ?[address], [place], [place] [address] ?Phone: [phone] ?[email or web address] ?Facebook  Engineer, structural  ?  ? ? ?

## 2022-01-04 NOTE — Evaluation (Signed)
Occupational Therapy Evaluation Patient Details Name: Jenna [name] MRN: [id number] DOB: 1932/12/15 Today's Date: 01/04/2022   History of Present Illness Patient is a 88 year old female who s/p R total reverse shoulder for end stage arthritis after failed conservative measures  PMH: R RTC repair, L RTCrepair, cervical fusion, lumbar stenosis, HTN, cervical stenosis   Clinical Impression   s/p shoulder replacement without functional use of right dominant upper extremity secondary to effects of surgery and interscalene block and shoulder precautions. Therapist provided education and instruction to patient and son in regards to exercises, precautions, positioning, donning upper extremity clothing and bathing while maintaining shoulder precautions, ice and edema management and donning/doffing sling. Patient and son verbalized understanding and demonstrated as needed. Patient declined to don clothes at this time. Patient and son were eduated on compensatory strategies to complete dressing/bathing tasks. Patient and son verbalized understanding. provided with instruction on compensatory strategies to perform ADLs. Patient to follow up with MD for further therapy needs.     Recommendations for follow up therapy are one component of a multi-disciplinary discharge planning process, led by the attending physician.  Recommendations may be updated based on patient status, additional functional criteria and insurance authorization.   Follow Up Recommendations  Follow physician's recommendations for discharge plan and follow up therapies    Assistance Recommended at Discharge Frequent or constant Supervision/Assistance  Patient can return home with the following A little help with walking and/or transfers;A little help with bathing/dressing/bathroom;Assistance with cooking/housework;Direct supervision/assist for financial management;Assist for transportation;Help with stairs or ramp for entrance;Direct  supervision/assist for medications management    Functional Status Assessment  Patient has had a recent decline in their functional status and demonstrates the ability to make significant improvements in function in a reasonable and predictable amount of time.  Equipment Recommendations  None recommended by OT    Recommendations for Other Services       Precautions / Restrictions Precautions Precautions: Shoulder;Fall Type of Shoulder Precautions: NO ROM shoulder, AROM wrist, elbow and hand Shoulder Interventions: At all times;Off for dressing/bathing/exercises;Shoulder sling/immobilizer Precaution Booklet Issued: Yes (comment) (handout) Restrictions Weight Bearing Restrictions: Yes RUE Weight Bearing: Non weight bearing      Mobility Bed Mobility Overal bed mobility: Needs Assistance Bed Mobility: Supine to Sit     Supine to sit: Min guard     General bed mobility comments: with increased time and cues. patient repored she would sleep in recliner at home    Transfers                          Balance Overall balance assessment: Needs assistance Sitting-balance support: No upper extremity supported, Feet supported Sitting balance-Leahy Scale: Good       Standing balance-Leahy Scale: Fair Standing balance comment: standing at sink brushing teeth and washing face                           ADL either performed or assessed with clinical judgement   ADL Overall ADL's : Needs assistance/impaired Eating/Feeding: Set up;Sitting Eating/Feeding Details (indicate cue type and reason): in recliner Grooming: Wash/dry face;Oral care;Standing;Wash/dry hands Grooming Details (indicate cue type and reason): at sink in bathroom. noted to have some confusion and forgetfull ness during tasks Upper Body Bathing: Moderate assistance;Sitting Upper Body Bathing Details (indicate cue type and reason): patient declined to complete Lower Body Bathing: Moderate  assistance;Sit to/from stand;Sitting/lateral leans Lower Body Bathing Details (  indicate cue type and reason): patient declined to complete Upper Body Dressing : Maximal assistance;Sitting;Cueing for UE precautions Upper Body Dressing Details (indicate cue type and reason): patient declined to complete Lower Body Dressing: Maximal assistance;Sit to/from stand;Sitting/lateral leans Lower Body Dressing Details (indicate cue type and reason): patient declined to complete Toilet Transfer: Nature conservation officer;Ambulation Toilet Transfer Details (indicate cue type and reason): with increased time.   Toileting - Clothing Manipulation Details (indicate cue type and reason): patient completed hygiene in sitting with no assist needed     Functional mobility during ADLs: Min guard General ADL Comments: patient was noted to have one moment of instability with CGA to regain standing balance. patient reported not walking far at baseline at home because of stenosis. patient was able to participate in over 100 feet of mobility prior to LOB. able to complete mobility in room with no LOB. cues to not grab out at furnitnure. patient reported feeling off from medications she was taking. nurse made aware.     Vision   Vision Assessment?: No apparent visual deficits Additional Comments: wears contacts at home     Perception     Praxis      Pertinent Vitals/Pain Pain Assessment Pain Assessment: No/denies pain     Hand Dominance Right   Extremity/Trunk Assessment Upper Extremity Assessment Upper Extremity Assessment: RUE deficits/detail RUE Deficits / Details: R rTSA. RUE: Shoulder pain at rest   Lower Extremity Assessment Lower Extremity Assessment: Overall WFL for tasks assessed   Cervical / Trunk Assessment Cervical / Trunk Assessment: Normal   Communication Communication Communication: No difficulties   Cognition Arousal/Alertness: Awake/alert Behavior During Therapy: WFL for tasks  assessed/performed Overall Cognitive Status: Within Functional Limits for tasks assessed                                       General Comments       Exercises     Shoulder Instructions Shoulder Instructions Donning/doffing shirt without moving shoulder: Caregiver independent with task (patient declined to participate) Method for sponge bathing under operated UE: Caregiver independent with task Donning/doffing sling/immobilizer: Caregiver independent with task;Minimal assistance Correct positioning of sling/immobilizer: Caregiver independent with task;Minimal assistance ROM for elbow, wrist and digits of operated UE: Caregiver independent with task;Supervision/safety Sling wearing schedule (on at all times/off for ADL's): Caregiver independent with task;Supervision/safety Proper positioning of operated UE when showering: Caregiver independent with task;Supervision/safety Positioning of UE while sleeping: Supervision/safety;Caregiver independent with task    Home Living Family/patient expects to be discharged to:: Private residence Living Arrangements: Alone Available Help at Discharge: Family;Available 24 hours/day Type of Home: House Home Access: Stairs to enter [employer] of Steps: 5 Entrance Stairs-Rails: Right;Left Home Layout: One level     Bathroom Shower/Tub: Walk-in shower         Home Equipment: Conservation officer, nature (2 wheels);Tub bench;Hand held shower head;BSC/3in1          Prior Functioning/Environment Prior Level of Function : Independent/Modified Independent                        OT Problem List: Decreased strength;Decreased activity tolerance;Impaired balance (sitting and/or standing);Decreased safety awareness;Impaired UE functional use;Pain;Decreased knowledge of precautions;Decreased knowledge of use of DME or AE      OT Treatment/Interventions: Self-care/ADL training;Therapeutic exercise;Energy  conservation;Neuromuscular education;DME and/or AE instruction;Therapeutic activities;Balance training;Patient/family education    OT Goals(Current goals can  be found in the care plan section) Acute Rehab OT Goals Patient Stated Goal: to stay here until she feels better OT Goal Formulation: With patient Time For Goal Achievement: 01/18/22 Potential to Achieve Goals: Good  OT Frequency: Min 2X/week    Co-evaluation              AM-PAC OT "6 Clicks" Daily Activity     Outcome Measure Help from another person eating meals?: A Little Help from another person taking care of personal grooming?: A Little Help from another person toileting, which includes using toliet, bedpan, or urinal?: A Little Help from another person bathing (including washing, rinsing, drying)?: A Lot Help from another person to put on and taking off regular upper body clothing?: A Lot Help from another person to put on and taking off regular lower body clothing?: A Lot 6 Click Score: 15   End of Session Equipment Utilized During Treatment: Gait belt Nurse Communication: Mobility status;Other (comment) (patient declining to participate in dressing tasks. and patient feeling unsure about going home)  Activity Tolerance: Patient tolerated treatment well Patient left: in chair;with call bell/phone within reach;with family/visitor present  OT Visit Diagnosis: Unsteadiness on feet (R26.81);Muscle weakness (generalized) (M62.81);Pain Pain - Right/Left: Right Pain - part of body: Shoulder                Time: 1694-5038 OT Time Calculation (min): 44 min Charges:  OT General Charges $OT Visit: 1 Visit OT Evaluation $OT Eval Low Complexity: 1 Low OT Treatments $Self Care/Home Management : 23-37 mins  Jackelyn Poling OTR/L, MS Acute Rehabilitation Department Office# [phone] Pager# [phone]   Marcellina Millin 01/04/2022, 11:46 AM

## 2022-01-06 ENCOUNTER — Encounter (HOSPITAL_COMMUNITY): Payer: Self-pay | Admitting: Orthopedic Surgery

## 2022-01-06 NOTE — Discharge Summary (Signed)
Patient ID: Jenna [name] MRN: [id number] DOB/AGE: 86-Aug-1934 86 y.o.  Admit date: 01/03/2022 Discharge date: 01/04/2022  Primary Diagnosis: Right shoulder osteoarthritis, rotator cuff insufficiency Admission Diagnoses: Status post right reverse total shoulder arthroplasty Past Medical History:  Diagnosis Date   Asthmatic bronchitis    Atrial fibrillation ([place]) 2019   Cataracts, bilateral    immature   Chronic back pain    arthritis.Stenosis   Dysphagia    Dyspnea    with exertion   Dysrhythmia    (?or extra beats) treated /w atenolol   Emphysema lung (HCC)    left   GERD (gastroesophageal reflux disease)    takes Omeprazole daily   Hard of hearing    wears hearing aides    History of blood transfusion    no abnormal reaction noted   History of gastric ulcer    Hypercholesteremia    takes Atorvastatin daily   Hypertension    takes Coreg and Losartan daily   Hypothyroidism    takes Synthroid daily   IBS (irritable bowel syndrome)    takes Librax daily   Joint pain    Lumbosacral spondylosis without myelopathy    Melanoma ([place])    on left leg, wide excision    Melanoma ([place]) 2022   abdomen   Muscle spasm    takes Robaxin daily as needed   Neuromuscular disorder ([place])    esophageal spasms at times    Osteoarthritis    Peripheral edema    take HCTZ daily   Pneumonia    hx of.Many yrs ago   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   Weight as of this encounter: 55.6 kg.  Procedure:  Procedure(s) (LRB): REVERSE SHOULDER ARTHROPLASTY (Right)   Consults: None  HPI: Jenna [name]  is an 88 year old female who presented to the clinic for initial evaluation with persistent right shoulder pain.  Patient failed conservative treatment x-rays revealed osteoarthritis of the right shoulder.  After failure of conservative treatment was determined patient elected to proceed with a right reverse total shoulder arthroplasty.  Patient presented for surgery on 01/03/2022 and was admitted for postoperative pain control and monitoring. Laboratory Data: Admission on 01/03/2022, Discharged on 01/04/2022  Component Date Value Ref Range Status   Hemoglobin 01/04/2022 9.8 (L)  12.0 - 15.0 g/dL Final   HCT 01/04/2022 29.4 (L)  36.0 - 46.0 % Final   Performed at [facility], [place] [address], [place], [place] [address]   Sodium 01/04/2022 129 (L)  135 - 145 mmol/L Final   Potassium 01/04/2022 3.7  3.5 - 5.1 mmol/L Final   Chloride 01/04/2022 102  98 - 111 mmol/L Final   CO2 01/04/2022 22  22 - 32 mmol/L Final   Glucose, Bld 01/04/2022 121 (H)  70 - 99 mg/dL Final   Glucose reference range applies only to samples taken after fasting for at least 8 hours.   BUN 01/04/2022 15  8 - 23 mg/dL Final   Creatinine, Ser 01/04/2022 0.55  0.44 - 1.00 mg/dL Final   Calcium  01/04/2022 8.5 (L)  8.9 - 10.3 mg/dL Final   GFR, Estimated 01/04/2022 >60  >60 mL/min Final   Comment: (NOTE) Calculated using the CKD-EPI Creatinine Equation (2021)    Anion gap 01/04/2022 5  5 - 15 Final   Performed at [facility], [place] [address] [place], [place] [address]  Hospital Outpatient Visit on 12/31/2021  Component Date Value Ref Range Status   SARS Coronavirus 2 12/31/2021 NEGATIVE  NEGATIVE Final   Comment: (NOTE) SARS-CoV-2 target nucleic acids are NOT DETECTED.  The SARS-CoV-2 RNA is generally detectable in upper and lower respiratory specimens during the acute phase of infection. Negative results do not preclude SARS-CoV-2 infection, do not rule out co-infections with other  pathogens, and should not be used as the sole basis for treatment or other patient management decisions. Negative results must be combined with clinical observations, patient history, and epidemiological information. The expected result is Negative.  Fact Sheet for Patients: SugarRoll.be  Fact Sheet for Healthcare Providers: https://www.woods-mathews.com/  This test is not yet approved or cleared by the [place] FDA and  has been authorized for detection and/or diagnosis of SARS-CoV-2 by FDA under an Emergency Use Authorization (EUA). This EUA will remain  in effect (meaning this test can be used) for the duration of the COVID-19 declaration under Se                          ction 564(b)(1) of the Act, 21 U.S.C. section 360bbb-3(b)(1), unless the authorization is terminated or revoked sooner.  Performed at [facility], [place] [address] [place], [place] [address]   Hospital Outpatient Visit on 12/23/2021  Component Date Value Ref Range Status   MRSA, PCR 12/23/2021 NEGATIVE  NEGATIVE Final   Staphylococcus aureus 12/23/2021 NEGATIVE  NEGATIVE Final   Comment: (NOTE) The Xpert SA Assay (FDA approved for NASAL specimens in patients 49 years of age and older), is one component of a comprehensive surveillance program. It is not intended to diagnose infection nor to guide or monitor treatment. Performed at [facility], [place] [address] [place], [place] [address]    WBC 12/23/2021 4.7  4.0 - 10.5 K/uL Final   RBC 12/23/2021 3.45 (L)  3.87 - 5.11 MIL/uL Final   Hemoglobin 12/23/2021 11.6 (L)  12.0 - 15.0 g/dL Final   HCT 12/23/2021 34.8 (L)  36.0 - 46.0 % Final   MCV 12/23/2021 100.9 (H)  80.0 - 100.0 fL Final   MCH 12/23/2021 33.6  26.0 - 34.0 pg Final   MCHC 12/23/2021 33.3  30.0 - 36.0 g/dL Final   RDW 12/23/2021 12.6  11.5 - 15.5 % Final   Platelets 12/23/2021 266  150 - 400 K/uL Final   nRBC 12/23/2021 0.0   0.0 - 0.2 % Final   Performed at [facility], [place] [address] [place], [place] [address]   Sodium 12/23/2021 132 (L)  135 - 145 mmol/L Final   Potassium 12/23/2021 4.0  3.5 - 5.1 mmol/L Final   Chloride 12/23/2021 99  98 - 111 mmol/L Final   CO2 12/23/2021 28  22 - 32 mmol/L Final   Glucose, Bld 12/23/2021 102 (H)  70 - 99 mg/dL Final   Glucose reference range applies only to samples taken after fasting for at least 8 hours.   BUN 12/23/2021 13  8 - 23 mg/dL Final   Creatinine, Ser 12/23/2021 0.57  0.44 - 1.00 mg/dL Final  Calcium 12/23/2021 9.0  8.9 - 10.3 mg/dL Final   GFR, Estimated 12/23/2021 >60  >60 mL/min Final   Comment: (NOTE) Calculated using the CKD-EPI Creatinine Equation (2021)    Anion gap 12/23/2021 5  5 - 15 Final   Performed at [facility], [place] [address] [place], [place] [address]     X-Rays:DG Shoulder Right Port  Result Date: 01/03/2022 CLINICAL DATA:  Postop RIGHT shoulder EXAM: RIGHT SHOULDER - 1 VIEW COMPARISON:  Portable exam 1147 hours without priors for comparison FINDINGS: Osseous demineralization. Reverse RIGHT shoulder arthroplasty components identified. No fracture, dislocation, or bone destruction. Few calcified bodies and bone fragments are seen at the surgical region. Ribs intact. AC joint alignment normal. IMPRESSION: Post reverse RIGHT shoulder arthroplasty. No acute osseous abnormalities. Electronically Signed   By: Lavonia Dana M.D.   On: 01/03/2022 12:22    EKG: Orders placed or performed in visit on 11/25/21   EKG 12-Lead     Hospital Course: Jenna [name] is a 88 y.o. who was admitted to Hospital. They were brought to the operating room on 01/03/2022 and underwent Procedure(s): [place].  Patient tolerated the procedure well and was later transferred to the recovery room and then to the orthopaedic floor for postoperative care.  They were given PO and IV analgesics for pain control  following their surgery.  They were given 24 hours of postoperative antibiotics of  Anti-infectives (From admission, onward)    Start     Dose/Rate Route Frequency Ordered Stop   01/03/22 2200  sulfamethoxazole-trimethoprim (BACTRIM DS) 800-160 MG per tablet 1 tablet  Status:  Discontinued       Note to Pharmacy: Take for one week.     1 tablet Oral 2 times daily 01/03/22 1202 01/03/22 1642   01/03/22 1600  ceFAZolin (ANCEF) IVPB 1 g/50 mL premix        1 g 100 mL/hr over 30 Minutes Intravenous Every 6 hours 01/03/22 1202 01/04/22 1148   01/03/22 0730  ceFAZolin (ANCEF) IVPB 2g/100 mL premix        2 g 200 mL/hr over 30 Minutes Intravenous On call to O.R. 01/03/22 1287 01/03/22 1002      and started on DVT prophylaxis in the form of  Eliquis .   OT were ordered for total joint protocol.  Discharge planning consulted to help with postop disposition and equipment needs.  Patient had an uneventful night on the evening of surgery.  They started to get up OOB with therapy on day one.  The patient had progressed with therapy and meeting their goals.  Incision was healing well.  Patient was seen in rounds and was ready to go home.   Diet: Regular diet Activity:NWB Follow-up:in 2 weeks Disposition - Home Discharged Condition: good   Discharge Instructions     Call MD / Call 911   Complete by: As directed    If you experience chest pain or shortness of breath, CALL 911 and be transported to the hospital emergency room.  If you develope a fever above 101 F, pus (white drainage) or increased drainage or redness at the wound, or calf pain, call your surgeon's office.   Constipation Prevention   Complete by: As directed    Drink plenty of fluids.  Prune juice may be helpful.  You may use a stool softener, such as Colace (over the counter) 100 mg twice a day.  Use MiraLax (over the counter) for constipation as needed.  Diet - low sodium heart healthy   Complete by: As directed    Increase  activity slowly as tolerated   Complete by: As directed    Post-operative opioid taper instructions:   Complete by: As directed    POST-OPERATIVE OPIOID TAPER INSTRUCTIONS: It is important to wean off of your opioid medication as soon as possible. If you do not need pain medication after your surgery it is ok to stop day one. Opioids include: Codeine, Hydrocodone(Norco, Vicodin), Oxycodone(Percocet, oxycontin) and hydromorphone amongst others.  Long term and even short term use of opiods can cause: Increased pain response Dependence Constipation Depression Respiratory depression And more.  Withdrawal symptoms can include Flu like symptoms Nausea, vomiting And more Techniques to manage these symptoms Hydrate well Eat regular healthy meals Stay active Use relaxation techniques(deep breathing, meditating, yoga) Do Not substitute Alcohol to help with tapering If you have been on opioids for less than two weeks and do not have pain than it is ok to stop all together.  Plan to wean off of opioids This plan should start within one week post op of your joint replacement. Maintain the same interval or time between taking each dose and first decrease the dose.  Cut the total daily intake of opioids by one tablet each day Next start to increase the time between doses. The last dose that should be eliminated is the evening dose.         Allergies as of 01/04/2022       Reactions   Pseudoephedrine Hcl Shortness Of Breath   Ultram [tramadol] Shortness Of Breath   Vicodin [hydrocodone-acetaminophen] Shortness Of Breath   Codeine Rash   [place] [flurazepam Hcl] Other (See Comments)   confusion   Nortriptyline    Pt unsure of reaction    Propoxyphene Other (See Comments)   Cephalexin Nausea Only   Clarithromycin Nausea Only   Levofloxacin Anxiety   Vibramycin [doxycycline Calcium] Rash        Medication List     TAKE these medications    acetaminophen 500 MG  tablet Commonly known as: TYLENOL Take 500-1,000 mg by mouth every 4 (four) hours as needed for moderate pain.   albuterol 108 (90 Base) MCG/ACT inhaler Commonly known as: VENTOLIN HFA Inhale 2 puffs into the lungs every 6 (six) hours as needed for wheezing or shortness of breath.   Align 4 MG Caps Take 4 mg by mouth daily.   atorvastatin 20 MG tablet Commonly known as: LIPITOR Take 20 mg by mouth daily.   Biotin 5000 MCG Tabs Take 5,000 mcg by mouth daily.   CALCIUM 600+D PO Take 1 tablet by mouth daily.   carvedilol 6.25 MG tablet Commonly known as: COREG Take 1 tablet (6.25 mg total) by mouth 2 (two) times daily with a meal.   cholecalciferol 1000 units tablet Commonly known as: VITAMIN D Take 1,000 Units by mouth daily.   clidinium-chlordiazePOXIDE 5-2.5 MG capsule Commonly known as: LIBRAX Take 1 capsule by mouth 2 (two) times daily before a meal.   diazepam 5 MG tablet Commonly known as: VALIUM Take 2.5 mg by mouth at bedtime as needed for sedation.   Eliquis 2.5 MG Tabs tablet Generic drug: apixaban TAKE 1 TABLET BY MOUTH 2 TIMES DAILY.   fluticasone 50 MCG/ACT nasal spray Commonly known as: FLONASE Place 2 sprays into both nostrils daily as needed for allergies.   furosemide 20 MG tablet Commonly known as: LASIX Take 20 mg by mouth daily as needed  for edema.   hyoscyamine 0.125 MG SL tablet Commonly known as: LEVSIN SL Place 0.125 mg under the tongue every 4 (four) hours as needed for cramping.   levothyroxine 112 MCG tablet Commonly known as: SYNTHROID Take 112 mcg by mouth daily before breakfast.   Linzess 72 MCG capsule Generic drug: linaclotide Take 72 mcg by mouth every morning.   losartan 100 MG tablet Commonly known as: COZAAR Take 1 tablet (100 mg total) by mouth daily.   naphazoline-pheniramine 0.025-0.3 % ophthalmic solution Commonly known as: NAPHCON-A Place 1 drop into both eyes 4 (four) times daily as needed for eye irritation.    omeprazole 40 MG capsule Commonly known as: PRILOSEC Take 40 mg by mouth daily.   oxyCODONE 5 MG immediate release tablet Commonly known as: Roxicodone Take 1 tablet (5 mg total) by mouth every 6 (six) hours as needed for severe pain or breakthrough pain.   sertraline 50 MG tablet Commonly known as: ZOLOFT Take 50 mg by mouth daily.   sulfamethoxazole-trimethoprim 800-160 MG tablet Commonly known as: Bactrim DS Take 1 tablet by mouth 2 (two) times daily. Take for one week.   Turmeric 500 MG Caps Take 500 mg by mouth daily.        Follow-up Information     [name], MD. Call in 2 week(s).   Specialty: Orthopedic Surgery Why: call [phone] for appt Contact information: [address] [place] [address] [phone]                 Signed: Jonelle Sidle PA-C Orthopaedic Surgery 01/06/2022, 3:44 PM

## 2022-01-08 ENCOUNTER — Emergency Department (HOSPITAL_COMMUNITY)
Admission: EM | Admit: 2022-01-08 | Discharge: 2022-01-09 | Disposition: A | Discharge status: Home / Self Care | Payer: Medicare Other | Attending: Emergency Medicine | Admitting: Emergency Medicine

## 2022-01-08 ENCOUNTER — Encounter (HOSPITAL_COMMUNITY): Payer: Self-pay | Admitting: *Deleted

## 2022-01-08 ENCOUNTER — Other Ambulatory Visit: Payer: Self-pay

## 2022-01-08 DIAGNOSIS — K5900 Constipation, unspecified: Secondary | ICD-10-CM | POA: Diagnosis present

## 2022-01-08 DIAGNOSIS — R339 Retention of urine, unspecified: Secondary | ICD-10-CM | POA: Diagnosis not present

## 2022-01-08 DIAGNOSIS — Z7901 Long term (current) use of anticoagulants: Secondary | ICD-10-CM | POA: Diagnosis not present

## 2022-01-08 DIAGNOSIS — E039 Hypothyroidism, unspecified: Secondary | ICD-10-CM | POA: Diagnosis not present

## 2022-01-08 DIAGNOSIS — I10 Essential (primary) hypertension: Secondary | ICD-10-CM | POA: Insufficient documentation

## 2022-01-08 DIAGNOSIS — K5903 Drug induced constipation: Secondary | ICD-10-CM | POA: Insufficient documentation

## 2022-01-08 DIAGNOSIS — Z79899 Other long term (current) drug therapy: Secondary | ICD-10-CM | POA: Insufficient documentation

## 2022-01-08 DIAGNOSIS — Z96611 Presence of right artificial shoulder joint: Secondary | ICD-10-CM | POA: Insufficient documentation

## 2022-01-08 DIAGNOSIS — J45909 Unspecified asthma, uncomplicated: Secondary | ICD-10-CM | POA: Diagnosis not present

## 2022-01-08 NOTE — ED Triage Notes (Signed)
Pt arrives from home via [facility]. Pt had recent right shoulder surgery. She has been taking oxycodone for pain. Has had issues with constipation. Pain in the rectal area.  ?

## 2022-01-09 ENCOUNTER — Emergency Department (HOSPITAL_COMMUNITY): Payer: Medicare Other

## 2022-01-09 LAB — CBC WITH DIFFERENTIAL/PLATELET
Abs Immature Granulocytes: 0.03 10*3/uL (ref 0.00–0.07)
Basophils Absolute: 0 10*3/uL (ref 0.0–0.1)
Basophils Relative: 0 %
Eosinophils Absolute: 0 10*3/uL (ref 0.0–0.5)
Eosinophils Relative: 0 %
HCT: 30.5 % — ABNORMAL LOW (ref 36.0–46.0)
Hemoglobin: 10.2 g/dL — ABNORMAL LOW (ref 12.0–15.0)
Immature Granulocytes: 0 %
Lymphocytes Relative: 9 %
Lymphs Abs: 0.8 10*3/uL (ref 0.7–4.0)
MCH: 33.3 pg (ref 26.0–34.0)
MCHC: 33.4 g/dL (ref 30.0–36.0)
MCV: 99.7 fL (ref 80.0–100.0)
Monocytes Absolute: 0.8 10*3/uL (ref 0.1–1.0)
Monocytes Relative: 9 %
Neutro Abs: 7.1 10*3/uL (ref 1.7–7.7)
Neutrophils Relative %: 82 %
Platelets: 309 10*3/uL (ref 150–400)
RBC: 3.06 MIL/uL — ABNORMAL LOW (ref 3.87–5.11)
RDW: 12.4 % (ref 11.5–15.5)
WBC: 8.7 10*3/uL (ref 4.0–10.5)
nRBC: 0 % (ref 0.0–0.2)

## 2022-01-09 LAB — URINALYSIS, ROUTINE W REFLEX MICROSCOPIC
Bilirubin Urine: NEGATIVE
Glucose, UA: NEGATIVE mg/dL
Hgb urine dipstick: NEGATIVE
Ketones, ur: 5 mg/dL — AB
Nitrite: NEGATIVE
Protein, ur: NEGATIVE mg/dL
Specific Gravity, Urine: 1.01 (ref 1.005–1.030)
pH: 7 (ref 5.0–8.0)

## 2022-01-09 LAB — BASIC METABOLIC PANEL
Anion gap: 7 (ref 5–15)
BUN: 11 mg/dL (ref 8–23)
CO2: 24 mmol/L (ref 22–32)
Calcium: 8.8 mg/dL — ABNORMAL LOW (ref 8.9–10.3)
Chloride: 101 mmol/L (ref 98–111)
Creatinine, Ser: 0.47 mg/dL (ref 0.44–1.00)
GFR, Estimated: >60 mL/min (ref >60)
Glucose, Bld: 111 mg/dL — ABNORMAL HIGH (ref 70–99)
Potassium: 3.7 mmol/L (ref 3.5–5.1)
Sodium: 132 mmol/L — ABNORMAL LOW (ref 135–145)

## 2022-01-09 MED ORDER — LIDOCAINE 4 % EX LOTN: TOPICAL_LOTION | CUTANEOUS | 0 refills | Status: DC

## 2022-01-09 NOTE — ED Provider Notes (Signed)
[facility] DEPT Provider Note  CSN: [id number] Arrival date & time: 01/08/22 2256  Chief Complaint(s) Constipation  HPI Jenna [name] is a 88 y.o. female     Constipation Severity:  Moderate Time since last bowel movement:  1 week Timing:  Constant Progression:  Unchanged Chronicity:  New Context: medication (narcotics for shoulder sx)   Stool description:  Malodorous and watery Relieved by:  Nothing Worsened by:  Nothing Associated symptoms: no abdominal pain, no fever, no hematochezia, no nausea and no vomiting    Past Medical History Past Medical History:  Diagnosis Date   Asthmatic bronchitis    Atrial fibrillation ([place]) 2019   Cataracts, bilateral    immature   Chronic back pain    arthritis.Stenosis   Dysphagia    Dyspnea    with exertion   Dysrhythmia    (?or extra beats) treated /w atenolol   Emphysema lung (H[facility])    left   GERD (gastroesophageal reflux disease)    takes Omeprazole daily   Hard of hearing    wears hearing aides    History of blood transfusion    no abnormal reaction noted   History of gastric ulcer    Hypercholesteremia    takes Atorvastatin daily   Hypertension    takes Coreg and Losartan daily   Hypothyroidism    takes Synthroid daily   IBS (irritable bowel syndrome)    takes Librax daily   Joint pain    Lumbosacral spondylosis without myelopathy    Melanoma ([place])    on left leg, wide excision    Melanoma ([place]) 2022   abdomen   Muscle spasm    takes Robaxin daily as needed   Neuromuscular disorder ([place])    esophageal spasms at times    Osteoarthritis    Peripheral edema    take HCTZ daily   Pneumonia    hx of.Many yrs ago   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   developed into pneumonia, resolved within a year   Seasonal allergies    takes Allegra daily and uses Flonase daily as needed   Venous stasis    Yeast infection    given Diflucan today   Patient Active Problem List    Diagnosis Date Noted   S/P shoulder replacement, right 01/03/2022   Medication side effects 08/25/2019   Fatigue 06/01/2019   PAF (paroxysmal atrial fibrillation) ([place]) 05/12/2019   Chronic anticoagulation 05/12/2019   Essential hypertension 05/12/2019   Hypothyroidism 05/12/2019   Lumbosacral spondylosis without myelopathy 01/23/2017   S/P shoulder replacement 02/01/2016   Cervical spondylosis with myelopathy 10/03/2014   Spondylosis of cervical joint 07/07/2012   Chronic pain 02/10/2012   Rotator cuff tear arthropathy of both shoulders 02/10/2012   Lumbar spondylosis 02/10/2012   Home Medication(s) Prior to Admission medications   Medication Sig Start Date End Date Taking? Authorizing Provider  Lidocaine 4 % LOTN Apply a dime-size amount to affected area prior to having a BM. Do not use more than 3 times per day. 01/09/22  Yes Dimitria Ketchum, Grayce Sessions, MD  acetaminophen (TYLENOL) 500 MG tablet Take 500-1,000 mg by mouth every 4 (four) hours as needed for moderate pain.    [provider]  albuterol (PROVENTIL HFA;VENTOLIN HFA) 108 (90 Base) MCG/ACT inhaler Inhale 2 puffs into the lungs every 6 (six) hours as needed for wheezing or shortness of breath.    [provider]  apixaban (ELIQUIS) 2.5 MG TABS tablet TAKE 1 TABLET BY MOUTH  2 TIMES DAILY. 10/10/21   Almyra Deforest, PA  atorvastatin (LIPITOR) 20 MG tablet Take 20 mg by mouth daily.    [provider]  Biotin 5000 MCG TABS Take 5,000 mcg by mouth daily.    [provider]  Calcium Carbonate-Vitamin D (CALCIUM 600+D PO) Take 1 tablet by mouth daily.    [provider]  carvedilol (COREG) 6.25 MG tablet Take 1 tablet (6.25 mg total) by mouth 2 (two) times daily with a meal. 08/29/19   Kilroy, Doreene Burke, PA-C  cholecalciferol (VITAMIN D) 1000 UNITS tablet Take 1,000 Units by mouth daily.    [provider]  clidinium-chlordiazePOXIDE (LIBRAX) 5-2.5 MG capsule Take 1 capsule by mouth 2 (two)  times daily before a meal.    [provider]  diazepam (VALIUM) 5 MG tablet Take 2.5 mg by mouth at bedtime as needed for sedation.  02/05/15   [provider]  fluticasone (FLONASE) 50 MCG/ACT nasal spray Place 2 sprays into both nostrils daily as needed for allergies.    [provider]  furosemide (LASIX) 20 MG tablet Take 20 mg by mouth daily as needed for edema.    [provider]  hyoscyamine (LEVSIN SL) 0.125 MG SL tablet Place 0.125 mg under the tongue every 4 (four) hours as needed for cramping.     [provider]  levothyroxine (SYNTHROID, LEVOTHROID) 112 MCG tablet Take 112 mcg by mouth daily before breakfast.    [provider]  LINZESS 72 MCG capsule Take 72 mcg by mouth every morning. 10/02/21   [provider]  losartan (COZAAR) 100 MG tablet Take 1 tablet (100 mg total) by mouth daily. 08/03/15   Bayard Hugger, NP  naphazoline-pheniramine (NAPHCON-A) 0.025-0.3 % ophthalmic solution Place 1 drop into both eyes 4 (four) times daily as needed for eye irritation.    [provider]  omeprazole (PRILOSEC) 40 MG capsule Take 40 mg by mouth daily.    [provider]  oxyCODONE (ROXICODONE) 5 MG immediate release tablet Take 1 tablet (5 mg total) by mouth every 6 (six) hours as needed for severe pain or breakthrough pain. 01/03/22 01/03/23  [name], MD  Probiotic Product (ALIGN) 4 MG CAPS Take 4 mg by mouth daily.    [provider]  sertraline (ZOLOFT) 50 MG tablet Take 50 mg by mouth daily. 06/22/20   [provider]  sulfamethoxazole-trimethoprim (BACTRIM DS) 800-160 MG tablet Take 1 tablet by mouth 2 (two) times daily. Take for one week. Patient not taking: Reported on 12/20/2021 11/25/21   Nunzio Cobbs, MD  Turmeric 500 MG CAPS Take 500 mg by mouth daily.    [provider]                                                                                                                                     Allergies Pseudoephedrine hcl, Ultram [  tramadol], Vicodin [hydrocodone-acetaminophen], Codeine, Dalmane [flurazepam hcl], Nortriptyline, Propoxyphene, Cephalexin, Clarithromycin, Levofloxacin, and Vibramycin [doxycycline calcium]  Review of Systems Review of Systems  Constitutional:  Negative for fever.  Gastrointestinal:  Positive for constipation. Negative for abdominal pain, hematochezia, nausea and vomiting.  As noted in HPI  Physical Exam Vital Signs  I have reviewed the triage vital signs BP (!) 175/68    Pulse 83    Temp 98.5 F (36.9 C) (Oral)    Resp 16    Ht '5\' 2"'$  (1.575 m)    Wt 24.9 kg    LMP 11/03/1980 (Approximate)    SpO2 99%    BMI 10.06 kg/m   Physical Exam Vitals reviewed.  Constitutional:      General: She is not in acute distress.    Appearance: She is well-developed. She is not diaphoretic.  HENT:     Head: Normocephalic and atraumatic.     Right Ear: External ear normal.     Left Ear: External ear normal.     Nose: Nose normal.  Eyes:     General: No scleral icterus.    Conjunctiva/sclera: Conjunctivae normal.  Neck:     Trachea: Phonation normal.  Cardiovascular:     Rate and Rhythm: Normal rate and regular rhythm.  Pulmonary:     Effort: Pulmonary effort is normal. No respiratory distress.     Breath sounds: No stridor.  Abdominal:     General: There is no distension.     Tenderness: There is no abdominal tenderness.  Genitourinary:    Comments: Moderate amount of stool in upper rectum, clay-like consistency. No bleeding. Musculoskeletal:        General: Normal range of motion.     Cervical back: Normal range of motion.  Neurological:     Mental Status: She is alert and oriented to person, place, and time.  Psychiatric:        Behavior: Behavior normal.    ED Results and Treatments Labs (all labs ordered are listed, but only abnormal results are displayed) Labs Reviewed  CBC WITH DIFFERENTIAL/PLATELET -  Abnormal; Notable for the following components:      Result Value   RBC 3.06 (*)    Hemoglobin 10.2 (*)    HCT 30.5 (*)    All other components within normal limits  BASIC METABOLIC PANEL - Abnormal; Notable for the following components:   Sodium 132 (*)    Glucose, Bld 111 (*)    Calcium 8.8 (*)    All other components within normal limits  URINALYSIS, ROUTINE W REFLEX MICROSCOPIC - Abnormal; Notable for the following components:   Ketones, ur 5 (*)    Leukocytes,Ua TRACE (*)    Bacteria, UA RARE (*)    All other components within normal limits                                                                                                                         EKG  EKG  Interpretation  Date/Time:    Ventricular Rate:    PR Interval:    QRS Duration:   QT Interval:    QTC Calculation:   R Axis:     Text Interpretation:         Radiology DG Abdomen 1 View  Result Date: 01/09/2022 CLINICAL DATA:  Constipation for 2 days EXAM: ABDOMEN - 1 VIEW COMPARISON:  None. FINDINGS: Scattered large and small bowel gas is noted. No significant retained fecal material is noted. No obstructive changes are noted. Postsurgical changes are seen in the hips and right upper quadrant. Degenerative changes of the lumbar spine are noted. IMPRESSION: No evidence of constipation. Electronically Signed   By: Inez Catalina M.D.   On: 01/09/2022 00:39    Pertinent labs & imaging results that were available during my care of the patient were reviewed by me and considered in my medical decision making (see MDM for details).  Medications Ordered in ED Medications - No data to display                                                                                                                                   Procedures Procedures  (including critical care time)  Medical Decision Making / ED Course    Complexity of Problem:  Co-morbidities/SDOH that complicate the patient evaluation/care: A-fib  on anticoagulation  Additional history obtained: none  Patient's presenting problem/concern and DDX listed below: Constipation Likely secondary to narcotic pain medicine, and not taking any bowel regimen. On exam does not concern for fecal impaction.  I have low suspicion for obstruction. Will get labs to assess for evidence SIRS that would be concerning for colitits or stercoral colitis.      Complexity of Data:   Cardiac Monitoring: none  Laboratory Tests ordered listed below with my independent interpretation: CBC without leukocytosis.  Stable hemoglobin. BMP no significant electrolyte derangements or renal sufficiency UA without evidence of infection.   Imaging Studies ordered listed below with my independent interpretation: KUB with mild to moderate stool burden in the descending colon. CT considered but not necessary     ED Course:    Hospitalization Considered:  Yes if signs of colitis  Assessment, Intervention, and Reassessment: Constipation 2/2 narcotic meds. Bowel regimen recommended. Topical lidocaine for pain. Noted to have urinary retention. Foley inserted retrieving greater than 800 cc of urine.  No infection.  Likely from anesthesia versus constipation. Urology follow-up recommended.  Final Clinical Impression(s) / ED Diagnoses Final diagnoses:  Drug-induced constipation  Urinary retention  The patient appears reasonably screened and/or stabilized for discharge and I doubt any other medical condition or other [facility] requiring further screening, evaluation, or treatment in the ED at this time prior to discharge. Safe for discharge with strict return precautions.  Disposition: Discharge  Condition: Good  I have discussed the results, Dx and Tx plan with the patient/family who expressed understanding and agree(s) with  the plan. Discharge instructions discussed at length. The patient/family was given strict return precautions who verbalized understanding of  the instructions. No further questions at time of discharge.    ED Discharge Orders          Ordered    Lidocaine 4 % LOTN        01/09/22 0324            Follow Up: Franchot Gallo, MD [place] [address] [phone]  Call  to schedule an appointment for close follow up            This chart was dictated using voice recognition software.  Despite best efforts to proofread,  errors can occur which can change the documentation meaning.    Fatima Blank, MD 01/09/22 [phone]

## 2022-01-09 NOTE — Discharge Instructions (Addendum)
Please start taking 1 capful of MiraLax 3 times a day. Slowly cut back as needed until you have normal bowel movements. ?

## 2022-01-09 NOTE — ED Notes (Signed)
Pt given written and verbal instruction on foley catheter care ?

## 2022-01-28 ENCOUNTER — Ambulatory Visit: Payer: Medicare Other | Admitting: Physician Assistant

## 2022-01-28 ENCOUNTER — Other Ambulatory Visit: Payer: Self-pay

## 2022-01-28 VITALS — BP 152/74 | HR 84 | Ht 62.0 in | Wt 124.0 lb

## 2022-01-28 DIAGNOSIS — I10 Essential (primary) hypertension: Secondary | ICD-10-CM | POA: Diagnosis not present

## 2022-01-28 DIAGNOSIS — I480 Paroxysmal atrial fibrillation: Secondary | ICD-10-CM

## 2022-01-28 DIAGNOSIS — E785 Hyperlipidemia, unspecified: Secondary | ICD-10-CM

## 2022-01-28 MED ORDER — CARVEDILOL 6.25 MG PO TABS: 6.2500 mg | ORAL_TABLET | Freq: Two times a day (BID) | ORAL | 3 refills | Status: DC

## 2022-01-28 NOTE — Progress Notes (Signed)
?Cardiology Office Note:   ? ?Date:  01/30/2022  ? ?ID:  Jenna [name], DOB [birth year], MRN [id number] ? ?PCP:  [name], MD ?  ?[facility] Providers ?Cardiologist:  Peter Martinique, MD    ? ?Referring MD: [name], MD  ? ?Chief Complaint  ?Patient presents with  ? Follow-up  ?  6 months.  ? Edema  ?  Ankles at times.  ? ? ?History of Present Illness:   ? ?Jenna [name] is a 88 y.o. female with a hx of PAF, GERD, HTN, HLD, and hypothyroidism.  Patient was first diagnosed with atrial fibrillation in January 2020.  However she suspected her A. fib may have dated back to September 2019 when she first noticed fatigue.  She was started on Eliquis 2.5 mg twice a day given her age and BMI.  Echocardiogram performed on January 12, 2019 showed EF 45 to 50% with moderate LAE.  She was initially supposed to undergo cardioversion, this was delayed due to onset of COVID-19 pandemic.  She ultimately underwent DCCV on 04/29/2019.  She had recurrence in July 2020, amiodarone was added. She subsequently converted back to sinus rhythm on amiodarone therapy.  Due to side effect, amiodarone was initially reduced and then eventually discontinued in October 2020.  I last saw the patient in June 2022 at which time she was doing well.  At which time, she still has occasional palpitation however typically only last a few hours before going back to an normal rhythm.  Patient was recently cleared for reverse shoulder surgery.  She underwent successful right shoulder replacement by Dr. Veverly Fells on 01/03/2022.  Since discharge, she has been seen in the ED on 01/09/2022 due to constipation related to narcotic meds. ? ?Patient is seen today for delayed 38-monthfollow-up.  She is doing well since shoulder repair.  She opted not to do physical therapy however she is doing her own exercises at home.  She denies any recent palpitation, chest discomfort or worsening dyspnea.  Recent blood work shows her hemoglobin has been trending up since  the surgery.  Renal function is stable.  Sodium level borderline low.  Overall, she is doing well from the cardiac perspective and can follow-up in 6 months with Dr. JMartinique  She is not anticipating any further surgery in the near term. ? ?Send report to Dr. RMancel Bale? ?Past Medical History:  ?Diagnosis Date  ? Asthmatic bronchitis   ? Atrial fibrillation ([place]  ? Cataracts, bilateral   ? immature  ? Chronic back pain   ? arthritis.Stenosis  ? Dysphagia   ? Dyspnea   ? with exertion  ? Dysrhythmia   ? (?or extra beats) treated /w atenolol  ? Emphysema lung ([place]   ? left  ? GERD (gastroesophageal reflux disease)   ? takes Omeprazole daily  ? Hard of hearing   ? wears hearing aides   ? History of blood transfusion   ? no abnormal reaction noted  ? History of gastric ulcer   ? Hypercholesteremia   ? takes Atorvastatin daily  ? Hypertension   ? takes Coreg and Losartan daily  ? Hypothyroidism   ? takes Synthroid daily  ? IBS (irritable bowel syndrome)   ? takes Librax daily  ? Joint pain   ? Lumbosacral spondylosis without myelopathy   ? [place]   ? on [place] leg, wide excision   ? Melanoma ([place] 2022  ? abdomen  ? Muscle spasm   ? takes Robaxin  daily as needed  ? Neuromuscular disorder ([place])   ? esophageal spasms at times   ? Osteoarthritis   ? Peripheral edema   ? take HCTZ daily  ? Pneumonia   ? hx of.Many yrs ago  ? 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 developed into pneumonia, resolved within a year  ? Seasonal allergies   ? takes Allegra daily and uses Flonase daily as needed  ? Venous stasis   ? Yeast infection   ? given Diflucan today  ? ? ?Past Surgical History:  ?Procedure Laterality Date  ? ABDOMINAL HYSTERECTOMY  1982  ? BSO  ? ANTERIOR CERVICAL DECOMP/DISCECTOMY FUSION N/A 10/03/2014  ? Procedure: ANTERIOR CERVICAL DECOMPRESSION/DISCECTOMY FUSION 1 LEVEL,CERVICAL THREE-FOUR;  Surgeon: [name], MD;  Location: [facility];  Service: Neurosurgery;  Laterality: N/A;  ? APPENDECTOMY  1986  ? along  with chole  ? BREAST BIOPSY Right 1997  ? sclerosing adenosis -Dr. Lucia Gaskins  ? CARDIOVERSION N/A 04/29/2019  ? Procedure: CARDIOVERSION;  Surgeon: Lelon Perla, MD;  Location[facility];  Service: Cardiovascular;  Laterality: N/A;  ? CHOLECYSTECTOMY    ? COLONOSCOPY    ? ESOPHAGOGASTRODUODENOSCOPY    ? with dilitation  ? LIPOMA EXCISION Left 10/1999  ? left thigh  ? lumbosacral cyst  2005  ? seen on injection for low back pain   ? LUNG BIOPSY  05/2001  ? /w Dr.Burney- bronchoscopy, mediastinoscopy  ? MELANOMA EXCISION    ? REVERSE SHOULDER ARTHROPLASTY Left 02/01/2016  ? Procedure: LEFT REVERSE TOTAL SHOULDER ARTHROPLASTY;  Surgeon: [name], MD;  Location: [facility];  Service: Orthopedics;  Laterality: Left;  ? REVERSE SHOULDER ARTHROPLASTY Right 01/03/2022  ? Procedure: REVERSE SHOULDER ARTHROPLASTY;  Surgeon: [name], MD;  Location: [facility];  Service: Orthopedics;  Laterality: Right;  NO ISB  ? rotator cuff  Bilateral 1996/1998  ? repair  ? [place]; 1981  ? ROTATOR CUFF REPAIR Right 1996  ? TONSILLECTOMY    ? TOTAL HIP ARTHROPLASTY Left 10/2000  ? TOTAL HIP ARTHROPLASTY Right 06/2005  ? TOTAL HIP ARTHROPLASTY Left   ? 10/2000  ? VAGINAL DELIVERY    ? x1  ? ? ?Current Medications: ?Current Meds  ?Medication Sig  ? acetaminophen (TYLENOL) 500 MG tablet Take 500-1,000 mg by mouth every 4 (four) hours as needed for moderate pain.  ? albuterol (PROVENTIL HFA;VENTOLIN HFA) 108 (90 Base) MCG/ACT inhaler Inhale 2 puffs into the lungs every 6 (six) hours as needed for wheezing or shortness of breath.  ? apixaban (ELIQUIS) 2.5 MG TABS tablet TAKE 1 TABLET BY MOUTH 2 TIMES DAILY.  ? atorvastatin (LIPITOR) 20 MG tablet Take 20 mg by mouth daily.  ? Biotin 5000 MCG TABS Take 5,000 mcg by mouth daily.  ? Calcium Carbonate-Vitamin D (CALCIUM 600+D PO) Take 1 tablet by mouth daily.  ? cholecalciferol (VITAMIN D) 1000 UNITS tablet Take 1,000 Units by mouth daily.  ? clidinium-chlordiazePOXIDE (LIBRAX)  5-2.5 MG capsule Take 1 capsule by mouth 2 (two) times daily before a meal.  ? diazepam (VALIUM) 5 MG tablet Take 2.5 mg by mouth at bedtime as needed for sedation.   ? fluticasone (FLONASE) 50 MCG/ACT nasal spray Place 2 sprays into both nostrils daily as needed for allergies.  ? furosemide (LASIX) 20 MG tablet Take 20 mg by mouth daily as needed for edema.  ? hyoscyamine (LEVSIN SL) 0.125 MG SL tablet Place 0.125 mg under the tongue every 4 (four) hours as needed  for cramping.   ? levothyroxine (SYNTHROID, LEVOTHROID) 112 MCG tablet Take 112 mcg by mouth daily before breakfast.  ? Lidocaine 4 % LOTN Apply a dime-size amount to affected area prior to having a BM. Do not use more than 3 times per day.  ? LINZESS 72 MCG capsule Take 72 mcg by mouth every morning.  ? losartan (COZAAR) 100 MG tablet Take 1 tablet (100 mg total) by mouth daily.  ? naphazoline-pheniramine (NAPHCON-A) 0.025-0.3 % ophthalmic solution Place 1 drop into both eyes 4 (four) times daily as needed for eye irritation.  ? omeprazole (PRILOSEC) 40 MG capsule Take 40 mg by mouth daily.  ? oxyCODONE (ROXICODONE) 5 MG immediate release tablet Take 1 tablet (5 mg total) by mouth every 6 (six) hours as needed for severe pain or breakthrough pain.  ? Probiotic Product (ALIGN) 4 MG CAPS Take 4 mg by mouth daily.  ? sertraline (ZOLOFT) 50 MG tablet Take 50 mg by mouth daily.  ? sulfamethoxazole-trimethoprim (BACTRIM DS) 800-160 MG tablet Take 1 tablet by mouth 2 (two) times daily. Take for one week.  ? Turmeric 500 MG CAPS Take 500 mg by mouth daily.  ? [DISCONTINUED] carvedilol (COREG) 6.25 MG tablet Take 1 tablet (6.25 mg total) by mouth 2 (two) times daily with a meal.  ?  ? ?Allergies:   Pseudoephedrine hcl, Ultram [tramadol], Vicodin [hydrocodone-acetaminophen], Codeine, Dalmane [flurazepam hcl], Nortriptyline, Propoxyphene, Cephalexin, Clarithromycin, Levofloxacin, and Vibramycin [doxycycline calcium]  ? ?Social History  ? ?Socioeconomic History  ?  Marital status: Widowed  ?  Spouse name: Not on file  ? Number of children: Not on file  ? Years of education: Not on file  ? Highest education level: Not on file  ?Occupational History  ? Not on file

## 2022-01-28 NOTE — Patient Instructions (Addendum)
Medication Instructions:  ?INCREASE Carvedilol (Coreg) to 12.5 mg 2 times a day ?*If you need a refill on your cardiac medications before your next appointment, please call your pharmacy* ? ?Lab Work: ?NONE ordered at this time of appointment  ? ?If you have labs (blood work) drawn today and your tests are completely normal, you will receive your results only by: ?MyChart Message (if you have MyChart) OR ?A paper copy in the mail ?If you have any lab test that is abnormal or we need to change your treatment, we will call you to review the results. ? ?Testing/Procedures: ?NONE ordered at this time of appointment  ? ?Follow-Up: ?At [facility], you and your health needs are our priority.  As part of our continuing mission to provide you with exceptional heart care, we have created designated Provider Care Teams.  These Care Teams include your primary Cardiologist (physician) and Advanced Practice Providers (APPs -  Physician Assistants and Nurse Practitioners) who all work together to provide you with the care you need, when you need it. ? ?Your next appointment:   ?6 month(s) ? ?The format for your next appointment:   ?In Person ? ?Provider:   ?Peter Martinique, MD   ? ?Other Instructions ? ? ?

## 2022-04-11 ENCOUNTER — Telehealth: Payer: Self-pay | Admitting: Physician Assistant

## 2022-04-11 MED ORDER — CARVEDILOL 12.5 MG PO TABS: 12.5000 mg | ORAL_TABLET | Freq: Two times a day (BID) | ORAL | 3 refills | Status: DC

## 2022-04-11 NOTE — Telephone Encounter (Signed)
Pt c/o medication issue:  1. Name of Medication:   carvedilol (COREG) 6.25 MG tablet    2. How are you currently taking this medication (dosage and times per day)? Take 1 tablet (6.25 mg total) by mouth 2 (two) times daily with a meal.  3. Are you having a reaction (difficulty breathing--STAT)?  No  4. What is your medication issue? Pt would like clarification on medication instructions. Pt states that she thought at last visit that she was told that medication would change to 2 tablets twice daily. Please advise

## 2022-04-11 NOTE — Telephone Encounter (Signed)
Returned the call to the patient. She was calling to verify the dose of the carvedilol. The dose had been increased at her last office visit in March with Almyra Deforest, [place]. New prescription has been sent in for her. Patient preferred a 30 day supply for now.  Hypertension: Blood pressure mildly elevated today, increase carvedilol to 12.5 mg twice a day for better blood pressure control

## 2022-04-18 ENCOUNTER — Encounter: Payer: Self-pay | Admitting: Gynecology

## 2022-07-10 NOTE — Progress Notes (Unsigned)
Cardiology Office Note:    Date:  07/10/2022   ID:  Jenna [name], DOB [birth year], MRN [id number]  PCP:  Jenna Dy, MD   Jenna [name] Cardiologist:  Jenna Pluta Martinique, MD     Referring MD: Jenna Dy, MD   No chief complaint on file.   History of Present Illness:    Jenna [name] is a 88 y.o. female with a hx of PAF, GERD, HTN, HLD, and hypothyroidism.  Patient was first diagnosed with atrial fibrillation in January 2020.  However she suspected her A. fib may have dated back to September 2019 when she first noticed fatigue.  She was started on Eliquis 2.5 mg twice a day given her age and BMI.  Echocardiogram performed on January 12, 2019 showed EF 45 to 50% with moderate LAE.  She was initially supposed to undergo cardioversion, this was delayed due to onset of COVID-19 pandemic.  She ultimately underwent DCCV on 04/29/2019.  She had recurrence in July 2020, amiodarone was added. She subsequently converted back to sinus rhythm on amiodarone therapy.  Due to side effect, amiodarone was initially reduced and then eventually discontinued in October 2020.  I last saw the patient in June 2022 at which time she was doing well.  At which time, she still has occasional palpitation however typically only last a few hours before going back to an normal rhythm.  Patient was recently cleared for reverse shoulder surgery.  She underwent successful right shoulder replacement by Dr. Veverly [name] on 01/03/2022.  Since discharge, she has been seen in the ED on 01/09/2022 due to constipation related to narcotic meds.     Patient is seen today for delayed 38-monthfollow-up.  She is doing well since shoulder repair.  She opted not to do physical therapy however she is doing her own exercises at home.  She denies any recent palpitation, chest discomfort or worsening dyspnea.  Recent blood work shows her hemoglobin has been trending up since the surgery.  Renal function is stable.  Sodium level borderline  low.  Overall, she is doing well from the cardiac perspective and can follow-up in 6 months with Dr. JMartinique  She is not anticipating any further surgery in the near term.    Past Medical History:  Diagnosis Date   Asthmatic bronchitis    Atrial fibrillation ([facility] 2019   Cataracts, bilateral    immature   Chronic back pain    arthritis.Stenosis   Dysphagia    Dyspnea    with exertion   Dysrhythmia    (?or extra beats) treated /w atenolol   Emphysema lung (HCC)    left   GERD (gastroesophageal reflux disease)    takes Omeprazole daily   Hard of hearing    wears hearing aides    History of blood transfusion    no abnormal reaction noted   History of gastric ulcer    Hypercholesteremia    takes Atorvastatin daily   Hypertension    takes Coreg and Losartan daily   Hypothyroidism    takes Synthroid daily   IBS (irritable bowel syndrome)    takes Librax daily   Joint pain    Lumbosacral spondylosis without myelopathy    Melanoma ([facility]    on left leg, wide excision    Melanoma ([facility] 2022   abdomen   Muscle spasm    takes Robaxin daily as needed   Neuromuscular disorder ([facility]    esophageal spasms at times    Osteoarthritis  Peripheral edema    take HCTZ daily   Pneumonia    hx of.Many yrs ago   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   developed into pneumonia, resolved within a year   Seasonal allergies    takes Allegra daily and uses Flonase daily as needed   Venous stasis    Yeast infection    given Diflucan today    Past Surgical History:  Procedure Laterality Date   ABDOMINAL HYSTERECTOMY  1982   BSO   ANTERIOR CERVICAL DECOMP/DISCECTOMY FUSION N/A 10/03/2014   Procedure: ANTERIOR CERVICAL DECOMPRESSION/DISCECTOMY FUSION 1 LEVEL,CERVICAL THREE-FOUR;  Surgeon: [name], MD;  Location: [facility];  Service: Neurosurgery;  Laterality: N/A;   APPENDECTOMY  1986   along with chole   BREAST BIOPSY Right 1997   sclerosing adenosis -Dr. Lucia Gaskins   CARDIOVERSION  N/A 04/29/2019   Procedure: CARDIOVERSION;  Surgeon: Lelon Perla, MD;  [facility] ENDOSCOPY;  Service: Cardiovascular;  Laterality: N/A;   CHOLECYSTECTOMY     COLONOSCOPY     ESOPHAGOGASTRODUODENOSCOPY     with dilitation   LIPOMA EXCISION Left 10/1999   left thigh   lumbosacral cyst  2005   seen on injection for low back pain    LUNG BIOPSY  05/2001   /w Dr.Burney- bronchoscopy, mediastinoscopy   MELANOMA EXCISION     REVERSE SHOULDER ARTHROPLASTY Left 02/01/2016   Procedure: LEFT REVERSE TOTAL SHOULDER ARTHROPLASTY;  Surgeon: [name], MD;  Location: [place];  Service: Orthopedics;  Laterality: Left;   REVERSE SHOULDER ARTHROPLASTY Right 01/03/2022   Procedure: REVERSE SHOULDER ARTHROPLASTY;  Surgeon: [name], MD;  Location: [facility]S;  Service: Orthopedics;  Laterality: Right;  NO ISB   rotator cuff  Bilateral 1996/1998   repair   ROTATOR CUFF REPAIR Left 1996; [place] Right 1996   TONSILLECTOMY     TOTAL HIP ARTHROPLASTY Left 10/2000   TOTAL HIP ARTHROPLASTY Right 06/2005   TOTAL HIP ARTHROPLASTY Left    10/2000   VAGINAL DELIVERY     x1    Current Medications: No outpatient medications have been marked as taking for the 07/14/22 encounter (Appointment) with [name], Delania Ferg M, MD.     Allergies:   Pseudoephedrine hcl, Ultram [tramadol], Vicodin [hydrocodone-acetaminophen], Codeine, Dalmane [flurazepam hcl], Nortriptyline, Propoxyphene, Cephalexin, Clarithromycin, Levofloxacin, and Vibramycin [doxycycline calcium]   Social History   Socioeconomic History   Marital status: Widowed    Spouse name: Not on file   Number of children: Not on file   Years of education: Not on file   Highest education level: Not on file  Occupational History   Not on file  Tobacco Use   Smoking status: Former    Packs/day: 1.00    Years: 15.00    Total pack years: 15.00    Types: Cigarettes   Smokeless tobacco: Never   Tobacco comments:    quit smoking in 1985   Vaping Use   Vaping Use: Never used  Substance and Sexual Activity   Alcohol use: Yes    Alcohol/week: 3.0 standard drinks of alcohol    Types: 3 Glasses of wine per week    Comment: occ wine   Drug use: No   Sexual activity: Not Currently    Partners: Male    Birth control/protection: Surgical    Comment: hysterectomy  Other Topics Concern   Not on file  Social History Narrative   Not on file   Social Determinants of Health   Financial  Resource Strain: Not on file  Food Insecurity: Not on file  Transportation Needs: Not on file  Physical Activity: Not on file  Stress: Not on file  Social Connections: Not on file     Family History: The patient's family history includes Diabetes in her brother; Heart attack in her father; Heart disease in her brother; Stroke in her mother.  ROS:   Please see the history of present illness.     All other systems reviewed and are negative.  EKGs/Labs/Other Studies Reviewed:    The following studies were reviewed today:  Echo 01/12/2019 1. The left ventricle has mildly reduced systolic function, with an  ejection fraction of 45-50%. The cavity size was normal. Left ventricular  diastolic function could not be evaluated secondary to atrial  fibrillation. Left ventrical global hypokinesis  without regional wall motion abnormalities.   2. The right ventricle has normal systolic function. The cavity was  normal. There is no increase in right ventricular wall thickness.   3. Left atrial size was moderately dilated.   4. The aortic valve is tricuspid Moderate thickening of the aortic valve  Moderate calcification of the aortic valve. Aortic valve regurgitation was  not assessed by color flow Doppler.   EKG:  EKG is ordered today.  The ekg ordered today demonstrates normal sinus rhythm, no significant ST-T wave changes  Recent Labs: 01/09/2022: BUN 11; Creatinine, Ser 0.47; Hemoglobin 10.2; Platelets 309; Potassium 3.7; Sodium 132  Recent  Lipid Panel No results found for: "CHOL", "TRIG", "HDL", "CHOLHDL", "VLDL", "LDLCALC", "LDLDIRECT"   Risk Assessment/Calculations:    CHA2DS2-VASc Score =     This indicates a  % annual risk of stroke. The patient's score is based upon:     {This patient has a significant risk of stroke if diagnosed with atrial fibrillation.  Please consider VKA or DOAC agent for anticoagulation if the bleeding risk is acceptable.   You can also use the SmartPhrase .[place] for documentation.   :[id number]}       Physical Exam:    VS:  LMP 11/03/1980 (Approximate)     Wt Readings from Last 3 Encounters:  01/28/22 124 lb (56.2 kg)  01/08/22 55 lb (24.9 kg)  01/03/22 122 lb 9.2 oz (55.6 kg)     GEN:  Well nourished, well developed in no acute distress HEENT: Normal NECK: No JVD; No carotid bruits LYMPHATICS: No lymphadenopathy CARDIAC: RRR, no murmurs, rubs, gallops RESPIRATORY:  Clear to auscultation without rales, wheezing or rhonchi  ABDOMEN: Soft, non-tender, non-distended MUSCULOSKELETAL:  No edema; No deformity  SKIN: Warm and dry NEUROLOGIC:  Alert and oriented x 3 PSYCHIATRIC:  Normal affect   ASSESSMENT:    No diagnosis found.  PLAN:    In order of problems listed above:  PAF: Continue Eliquis and carvedilol.  Maintaining sinus rhythm based on today's EKG.  Overall doing well from the cardiac perspective.  Hypertension: Blood pressure mildly elevated today, increase carvedilol to 12.5 mg twice a day for better blood pressure control  Hyperlipidemia: On Lipitor.      Medication Adjustments/Labs and Tests Ordered: Current medicines are reviewed at length with the patient today.  Concerns regarding medicines are outlined above.  No orders of the defined types were placed in this encounter.  No orders of the defined types were placed in this encounter.   There are no Patient Instructions on file for this visit.   Signed, Zakari Bathe Martinique, MD  07/10/2022 1:44 PM     [facility]

## 2022-07-14 ENCOUNTER — Ambulatory Visit: Payer: Medicare Other | Attending: Cardiology | Admitting: Cardiology

## 2022-07-14 ENCOUNTER — Encounter: Payer: Self-pay | Admitting: Cardiology

## 2022-07-14 VITALS — BP 128/60 | HR 68 | Ht 62.5 in | Wt 120.0 lb

## 2022-07-14 DIAGNOSIS — I480 Paroxysmal atrial fibrillation: Secondary | ICD-10-CM | POA: Diagnosis not present

## 2022-07-14 DIAGNOSIS — I10 Essential (primary) hypertension: Secondary | ICD-10-CM

## 2022-07-14 DIAGNOSIS — E785 Hyperlipidemia, unspecified: Secondary | ICD-10-CM | POA: Diagnosis not present

## 2022-07-14 MED ORDER — APIXABAN 2.5 MG PO TABS
2.5000 mg | ORAL_TABLET | Freq: Two times a day (BID) | ORAL | 3 refills | Status: AC
Start: 2022-07-14 — End: ?

## 2022-07-14 MED ORDER — CARVEDILOL 12.5 MG PO TABS: 12.5000 mg | ORAL_TABLET | Freq: Two times a day (BID) | ORAL | 3 refills | Status: AC

## 2022-07-14 MED ORDER — LOSARTAN POTASSIUM 100 MG PO TABS
100.0000 mg | ORAL_TABLET | Freq: Every day | ORAL | 3 refills | Status: AC
Start: 2022-07-14 — End: ?

## 2022-09-17 ENCOUNTER — Telehealth: Payer: Self-pay | Admitting: Cardiology

## 2022-09-17 NOTE — Telephone Encounter (Signed)
Pt c/o medication issue:  1. Name of Medication:  Cyclobenzaprine  2. How are you currently taking this medication (dosage and times per day)?   3. Are you having a reaction (difficulty breathing--STAT)?   4. What is your medication issue?   Patient states her dentist dave her this muscle relaxant, but pharmacist advised her to check with cardiology before taking it. She states she will not take any until she hears back from our office. She states a detailed voice message will be fine if she is unavailable when her call is returned.

## 2022-09-17 NOTE — Telephone Encounter (Signed)
Routed to pharmD to review

## 2022-09-18 NOTE — Telephone Encounter (Signed)
Concurrent use of SERTRALINE and cyclobenzaprine may result in increased risk of serotonin syndrome and an increased risk of QT interval prolongation.  Would not recommend

## 2022-09-18 NOTE — Telephone Encounter (Signed)
Patient informed of PharmD recommendation for cyclobenzaprine: "Concurrent use of SERTRALINE and cyclobenzaprine may result in increased risk of serotonin syndrome and an increased risk of QT interval prolongation.  Would not recommend." Patient then asked is she could take Robaxin '250mg'$  or '500mg'$ . She has some '500mg'$  tablets from a previous prescription.

## 2022-09-18 NOTE — Telephone Encounter (Signed)
Patient informed of PharmD reply: "No drug interactions with methocarbamol. Recommend she take the smallest dose for the shortest period of time as needed." Patient stated she will take Robaxin '250mg'$  if needed.

## 2022-09-18 NOTE — Telephone Encounter (Signed)
No drug interactions with methocarbamol. Recommend she take the smallest dose for the shortest period of time as needed

## 2022-11-08 IMAGING — DX DG ABDOMEN 1V
1 series · 1 of 1 positions shown · non-contrast
Comparison: None.

CLINICAL DATA: Constipation for 2 days

EXAM:
ABDOMEN - 1 VIEW

[abdomen kub]
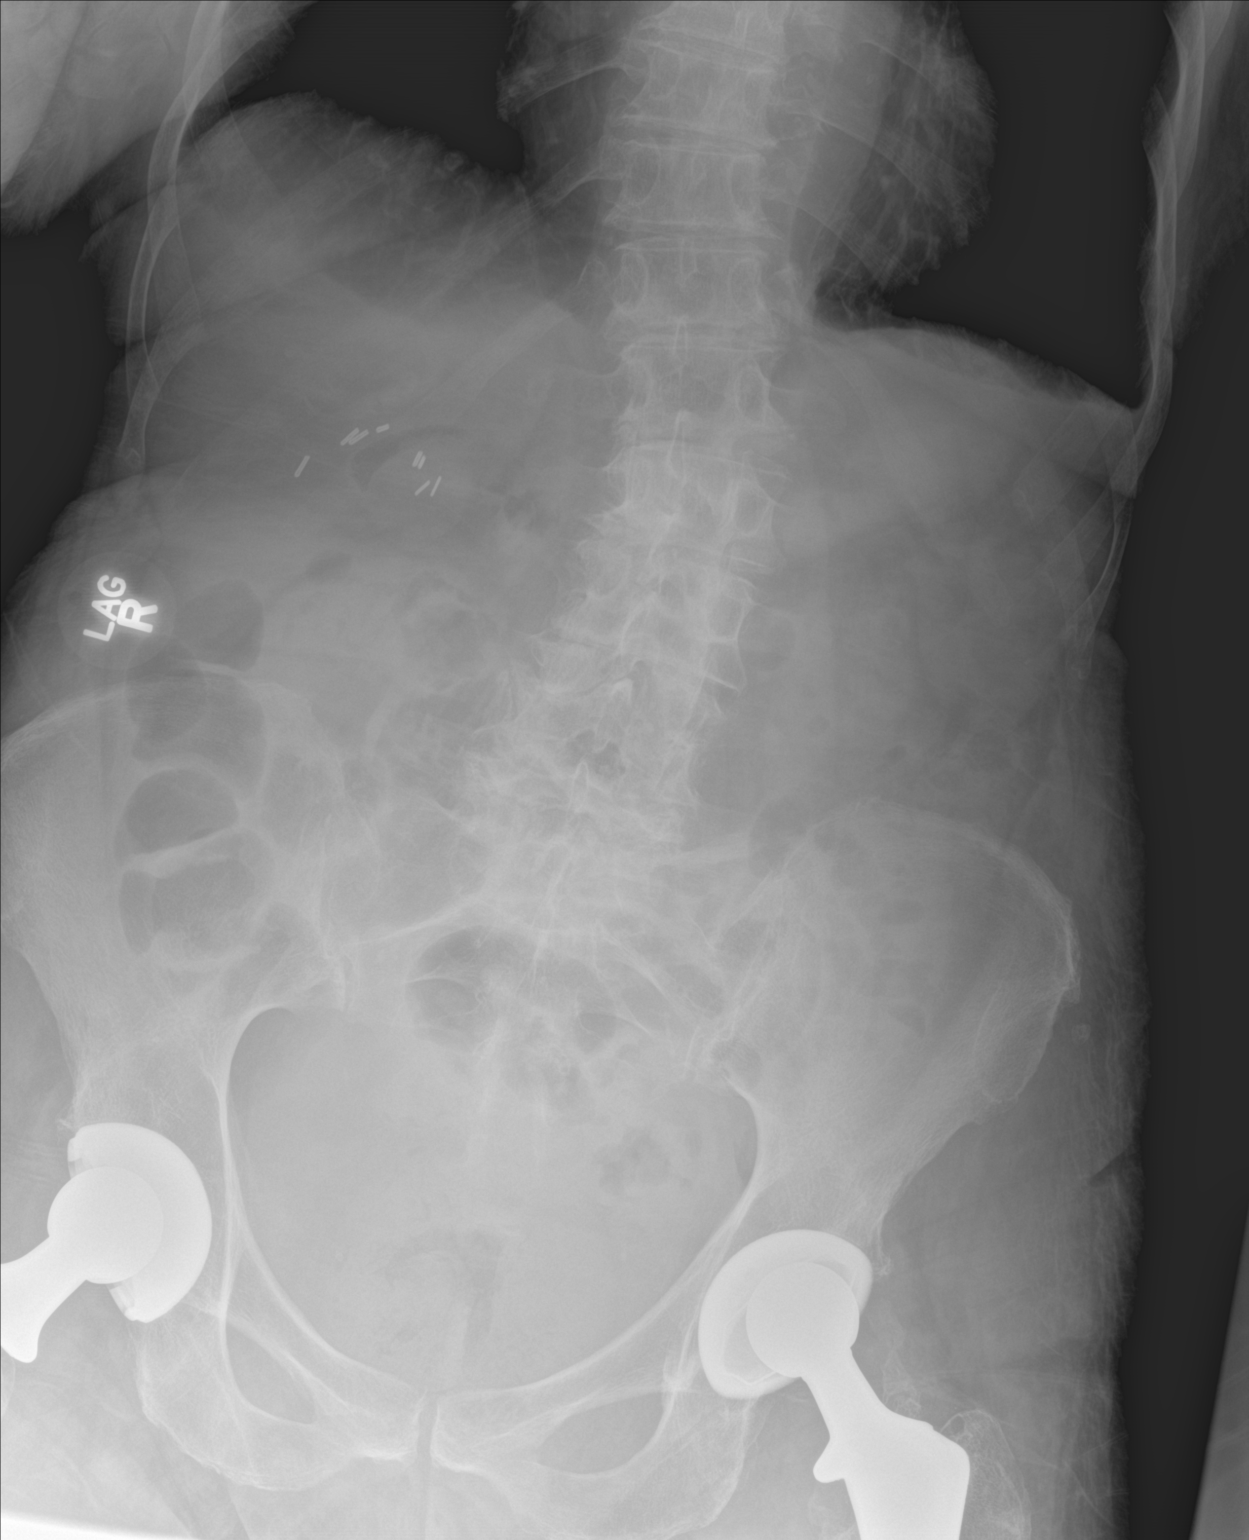

[1 of 1 positions shown; findings below may reference images not displayed]

FINDINGS: Scattered large and small bowel gas is noted. No significant
retained fecal material is noted. No obstructive changes are noted.
Postsurgical changes are seen in the hips and right upper quadrant.
Degenerative changes of the lumbar spine are noted.
IMPRESSION: No evidence of constipation.

## 2023-02-18 NOTE — Progress Notes (Deleted)
89 y.o. G79P1001 Widowed Caucasian female here for annual exam.    PCP:     Patient's last menstrual period was 11/03/1980 (approximate).           Sexually active: {yes no:314532}  The current method of family planning is status post hysterectomy.    Exercising: {yes no:314532}  {types:19826} Smoker:  former  Health Maintenance: Pap:  2002 neg History of abnormal Pap:  no MMG:  04/15/22 Breast Density Cat C, BI-RADS CAT 2 benign Colonoscopy:  2004 (could not swallow prep) BMD:   05/19/17  Result  WNL TDaP:  2013 Gardasil:   no HIV: n/a Hep C: n/a Screening Labs:  Hb today: ***, Urine today: ***   reports that she has quit smoking. Her smoking use included cigarettes. She has a 15.00 pack-year smoking history. She has never used smokeless tobacco. She reports current alcohol use of about 3.0 standard drinks of alcohol per week. She reports that she does not use drugs.  Past Medical History:  Diagnosis Date   Asthmatic bronchitis    Atrial fibrillation (HCC) 2019   Cataracts, bilateral    immature   Chronic back pain    arthritis.Stenosis   Dysphagia    Dyspnea    with exertion   Dysrhythmia    (?or extra beats) treated /w atenolol   Emphysema lung (HCC)    left   GERD (gastroesophageal reflux disease)    takes Omeprazole daily   Hard of hearing    wears hearing aides    History of blood transfusion    no abnormal reaction noted   History of gastric ulcer    Hypercholesteremia    takes Atorvastatin daily   Hypertension    takes Coreg and Losartan daily   Hypothyroidism    takes Synthroid daily   IBS (irritable bowel syndrome)    takes Librax daily   Joint pain    Lumbosacral spondylosis without myelopathy    Melanoma (HCC)    on left leg, wide excision    Melanoma (HCC) 2022   abdomen   Muscle spasm    takes Robaxin daily as needed   Neuromuscular disorder (HCC)    esophageal spasms at times    Osteoarthritis    Peripheral edema    take HCTZ daily    Pneumonia    hx of.Many yrs ago   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   developed into pneumonia, resolved within a year   Seasonal allergies    takes Allegra daily and uses Flonase daily as needed   Venous stasis    Yeast infection    given Diflucan today    Past Surgical History:  Procedure Laterality Date   ABDOMINAL HYSTERECTOMY  1982   BSO   ANTERIOR CERVICAL DECOMP/DISCECTOMY FUSION N/A 10/03/2014   Procedure: ANTERIOR CERVICAL DECOMPRESSION/DISCECTOMY FUSION 1 LEVEL,CERVICAL THREE-FOUR;  Surgeon: Barnett Abu, MD;  Location: [facility];  Service: Neurosurgery;  Laterality: N/A;   APPENDECTOMY  1986   along with chole   BREAST BIOPSY Right 1997   sclerosing adenosis -Dr. Ezzard Standing   CARDIOVERSION N/A 04/29/2019   Procedure: CARDIOVERSION;  Surgeon: Lewayne Bunting, MD;  Location: [facility] ENDOSCOPY;  Service: Cardiovascular;  Laterality: N/A;   CHOLECYSTECTOMY     COLONOSCOPY     ESOPHAGOGASTRODUODENOSCOPY     with dilitation   LIPOMA EXCISION Left 10/1999   left thigh   lumbosacral cyst  2005   seen on injection for low back pain    LUNG  BIOPSY  05/2001   /w Dr.Burney- bronchoscopy, mediastinoscopy   MELANOMA EXCISION     REVERSE SHOULDER ARTHROPLASTY Left 02/01/2016   Procedure: LEFT REVERSE TOTAL SHOULDER ARTHROPLASTY;  Surgeon: Beverely Low, MD;  Location: [facility] OR;  Service: Orthopedics;  Laterality: Left;   REVERSE SHOULDER ARTHROPLASTY Right 01/03/2022   Procedure: REVERSE SHOULDER ARTHROPLASTY;  Surgeon: Beverely Low, MD;  Location: [facility] ORS;  Service: Orthopedics;  Laterality: Right;  NO ISB   rotator cuff  Bilateral 1996/1998   repair   ROTATOR CUFF REPAIR Left 1996; 1981   ROTATOR CUFF REPAIR Right 1996   TONSILLECTOMY     TOTAL HIP ARTHROPLASTY Left 10/2000   TOTAL HIP ARTHROPLASTY Right 06/2005   TOTAL HIP ARTHROPLASTY Left    10/2000   VAGINAL DELIVERY     x1    Current Outpatient Medications  Medication Sig Dispense Refill   acetaminophen (TYLENOL) 500 MG  tablet Take 500-1,000 mg by mouth every 4 (four) hours as needed for moderate pain.     albuterol (PROVENTIL HFA;VENTOLIN HFA) 108 (90 Base) MCG/ACT inhaler Inhale 2 puffs into the lungs every 6 (six) hours as needed for wheezing or shortness of breath.     apixaban (ELIQUIS) 2.5 MG TABS tablet Take 1 tablet (2.5 mg total) by mouth 2 (two) times daily. 180 tablet 3   atorvastatin (LIPITOR) 20 MG tablet Take 20 mg by mouth daily.     Biotin 5000 MCG TABS Take 5,000 mcg by mouth daily.     Calcium Carbonate-Vitamin D (CALCIUM 600+D PO) Take 1 tablet by mouth daily.     carvedilol (COREG) 12.5 MG tablet Take 1 tablet (12.5 mg total) by mouth 2 (two) times daily with a meal. 180 tablet 3   cholecalciferol (VITAMIN D) 1000 UNITS tablet Take 1,000 Units by mouth daily.     clidinium-chlordiazePOXIDE (LIBRAX) 5-2.5 MG capsule Take 1 capsule by mouth 2 (two) times daily before a meal.     diazepam (VALIUM) 5 MG tablet Take 2.5 mg by mouth at bedtime as needed for sedation.      fluticasone (FLONASE) 50 MCG/ACT nasal spray Place 2 sprays into both nostrils daily as needed for allergies.     furosemide (LASIX) 20 MG tablet Take 20 mg by mouth daily as needed for edema.     hyoscyamine (LEVSIN SL) 0.125 MG SL tablet Place 0.125 mg under the tongue every 4 (four) hours as needed for cramping.      levothyroxine (SYNTHROID, LEVOTHROID) 112 MCG tablet Take 112 mcg by mouth daily before breakfast.     losartan (COZAAR) 100 MG tablet Take 1 tablet (100 mg total) by mouth daily. 90 tablet 3   methocarbamol (ROBAXIN) 500 MG tablet methocarbamol 500 mg tablet  Take 1 tablet 4 times a day by oral route.     omeprazole (PRILOSEC) 40 MG capsule Take 40 mg by mouth daily.     Probiotic Product (ALIGN) 4 MG CAPS Take 4 mg by mouth daily.     sertraline (ZOLOFT) 50 MG tablet Take 50 mg by mouth daily.     No current facility-administered medications for this visit.    Family History  Problem Relation Age of Onset    Stroke Mother    Heart attack Father    Diabetes Brother    Heart disease Brother     Review of Systems  Exam:   LMP 11/03/1980 (Approximate)     General appearance: alert, cooperative and appears stated age Head: normocephalic, without  obvious abnormality, atraumatic Neck: no adenopathy, supple, symmetrical, trachea midline and thyroid normal to inspection and palpation Lungs: clear to auscultation bilaterally Breasts: normal appearance, no masses or tenderness, No nipple retraction or dimpling, No nipple discharge or bleeding, No axillary adenopathy Heart: regular rate and rhythm Abdomen: soft, non-tender; no masses, no organomegaly Extremities: extremities normal, atraumatic, no cyanosis or edema Skin: skin color, texture, turgor normal. No rashes or lesions Lymph nodes: cervical, supraclavicular, and axillary nodes normal. Neurologic: grossly normal  Pelvic: External genitalia:  no lesions              No abnormal inguinal nodes palpated.              Urethra:  normal appearing urethra with no masses, tenderness or lesions              Bartholins and Skenes: normal                 Vagina: normal appearing vagina with normal color and discharge, no lesions              Cervix: no lesions              Pap taken: {yes no:314532} Bimanual Exam:  Uterus:  normal size, contour, position, consistency, mobility, non-tender              Adnexa: no mass, fullness, tenderness              Rectal exam: {yes no:314532}.  Confirms.              Anus:  normal sphincter tone, no lesions  Chaperone was present for exam:  ***  Assessment:   Well woman visit with gynecologic exam.   Plan: Mammogram screening discussed. Self breast awareness reviewed. Pap and HR HPV as above. Guidelines for Calcium, Vitamin D, regular exercise program including cardiovascular and weight bearing exercise.   Follow up annually and prn.   Additional counseling given.  {yes [id number]. _______ minutes  face to face time of which over 50% was spent in counseling.    After visit summary provided.    '

## 2023-03-04 ENCOUNTER — Ambulatory Visit: Payer: Medicare Other | Admitting: Obstetrics and Gynecology

## 2023-04-30 ENCOUNTER — Other Ambulatory Visit: Payer: Self-pay | Admitting: Internal Medicine

## 2023-04-30 ENCOUNTER — Ambulatory Visit
Admission: RE | Admit: 2023-04-30 | Discharge: 2023-04-30 | Disposition: A | Discharge status: Home / Self Care | Payer: Medicare Other | Source: Ambulatory Visit | Attending: Internal Medicine | Admitting: Internal Medicine

## 2023-04-30 DIAGNOSIS — R0600 Dyspnea, unspecified: Secondary | ICD-10-CM

## 2023-07-01 ENCOUNTER — Ambulatory Visit: Payer: Medicare Other | Admitting: Obstetrics and Gynecology

## 2023-07-13 NOTE — Progress Notes (Unsigned)
  Cardiology Office Note:  .   Date:  07/13/2023  ID:  Jenna [name], DOB 12-09-1932, MRN [id number] PCP: Burton Apley, MD  [facility] Providers Cardiologist:  Peter Swaziland, MD   History of Present Illness: .   Jenna [name] is a 89 y.o. female with a past medical history of HTN, PAF, hypothyroidism, chronic pain, GERD, HTN, HLD. Patient is followed by Dr. Swaziland and presents today for an annual follow up appointment  Patient was first diagnosed with atrial fibrillation in 11/2018. She was started on eliquis 2.5 mg BID (dose reduced due to age and BMI). She underwent echocardiogram on 01/12/19 that showed EF 45-50%, normal RV function, moderately dilated left atrium. She was initially set up for cardioversion, but this was delayed due to the onset of the COVID-19 pandemic. She eventually underwent DCCV on 04/29/2019. Her afib recurred in 05/2019, and she was started on amiodarone for rhythm control. However, patient had side effects on amiodarone and it was discontinued in 08/2019. Since then, patient's atrial fibrillation has overall been well controlled on carvedilol. She was last seen by Dr. Swaziland on 07/25/22. At that time, she reported having only rare episodes of afib. Remained on eliquis and carvedilol.   Paroxysmal Atrial fibrillation  -  - Continue eliquis 2.5 mg BID - this is the correct dose for her based on age, weight - Continue carvedilol 12.5 mg BID   Chronic HFmrEF  - Echocardiogram from 01/2019 showed EF 45-50%, normal RV function - - Continue lasix 20 mg PRN for swelling   - Continue carvedilol 12.5 mg BID, losartan 100 mg daily  - Ordered BMP  HTN  - BP well controlled  - Continue carvedilol, losartan   HLD       ROS: ***  Studies Reviewed: .        *** Risk Assessment/Calculations:   {Does this patient have ATRIAL FIBRILLATION?:[phone]} No BP recorded.  {Refresh Note OR Click here to enter BP  :1}***       Physical Exam:   VS:  LMP  11/03/1980 (Approximate)    Wt Readings from Last 3 Encounters:  07/14/22 120 lb (54.4 kg)  01/28/22 124 lb (56.2 kg)  01/08/22 55 lb (24.9 kg)    GEN: Well nourished, well developed in no acute distress NECK: No JVD; No carotid bruits CARDIAC: ***RRR, no murmurs, rubs, gallops RESPIRATORY:  Clear to auscultation without rales, wheezing or rhonchi  ABDOMEN: Soft, non-tender, non-distended EXTREMITIES:  No edema; No deformity   ASSESSMENT AND PLAN: .   ***    {Are you ordering a CV Procedure (e.g. stress test, cath, DCCV, TEE, etc)?   Press F2        :[id number]}  Dispo: ***  Signed, Jonita Albee, PA-C

## 2023-07-16 ENCOUNTER — Encounter: Payer: Self-pay | Admitting: Cardiology

## 2023-07-16 ENCOUNTER — Ambulatory Visit: Payer: Medicare Other | Attending: Cardiology | Admitting: Cardiology

## 2023-07-16 VITALS — BP 122/92 | HR 78 | Ht 62.0 in | Wt 122.0 lb

## 2023-07-16 DIAGNOSIS — I5022 Chronic systolic (congestive) heart failure: Secondary | ICD-10-CM

## 2023-07-16 DIAGNOSIS — I10 Essential (primary) hypertension: Secondary | ICD-10-CM

## 2023-07-16 DIAGNOSIS — E785 Hyperlipidemia, unspecified: Secondary | ICD-10-CM | POA: Diagnosis not present

## 2023-07-16 DIAGNOSIS — I480 Paroxysmal atrial fibrillation: Secondary | ICD-10-CM

## 2023-07-16 NOTE — Patient Instructions (Signed)
Medication Instructions:  No Changes *If you need a refill on your cardiac medications before your next appointment, please call your pharmacy*   Lab Work: The BMET was drawn today If you have labs (blood work) drawn today and your tests are completely normal, you will receive your results only by: MyChart Message (if you have MyChart) OR A paper copy in the mail If you have any lab test that is abnormal or we need to change your treatment, we will call you to review the results.   Testing/Procedures: No testing   Follow-Up: At [facility], you and your health needs are our priority.  As part of our continuing mission to provide you with exceptional heart care, we have created designated Provider Care Teams.  These Care Teams include your primary Cardiologist (physician) and Advanced Practice Providers (APPs -  Physician Assistants and Nurse Practitioners) who all work together to provide you with the care you need, when you need it.  We recommend signing up for the patient portal called "MyChart".  Sign up information is provided on this After Visit Summary.  MyChart is used to connect with patients for Virtual Visits (Telemedicine).  Patients are able to view lab/test results, encounter notes, upcoming appointments, etc.  Non-urgent messages can be sent to your provider as well.   To learn more about what you can do with MyChart, go to [email or web address].    Your next appointment:   1 year(s)  Provider:   Peter Swaziland, MD

## 2023-07-23 ENCOUNTER — Telehealth: Payer: Self-pay | Admitting: Cardiology

## 2023-07-23 NOTE — Telephone Encounter (Signed)
Patient's daughter in law Kennith Center) came in to get another copy of patient's labs. Patient lost her paper for labs. I let Kennith Center know the patient would have to be present to release anything. Alvino Chapel

## 2023-09-01 ENCOUNTER — Encounter: Payer: Medicare Other | Admitting: Radiology

## 2023-10-19 ENCOUNTER — Ambulatory Visit: Payer: Medicare Other | Admitting: Obstetrics and Gynecology

## 2024-01-12 ENCOUNTER — Ambulatory Visit (INDEPENDENT_AMBULATORY_CARE_PROVIDER_SITE_OTHER): Admitting: Obstetrics and Gynecology

## 2024-01-12 ENCOUNTER — Other Ambulatory Visit (HOSPITAL_COMMUNITY)
Admission: RE | Admit: 2024-01-12 | Discharge: 2024-01-12 | Disposition: A | Discharge status: Home / Self Care | Source: Ambulatory Visit | Attending: Obstetrics and Gynecology | Admitting: Obstetrics and Gynecology

## 2024-01-12 ENCOUNTER — Encounter: Payer: Self-pay | Admitting: Obstetrics and Gynecology

## 2024-01-12 VITALS — BP 132/76 | Ht 62.0 in | Wt 130.0 lb

## 2024-01-12 DIAGNOSIS — N898 Other specified noninflammatory disorders of vagina: Secondary | ICD-10-CM | POA: Diagnosis present

## 2024-01-12 DIAGNOSIS — Z719 Counseling, unspecified: Secondary | ICD-10-CM | POA: Diagnosis not present

## 2024-01-12 DIAGNOSIS — Z8679 Personal history of other diseases of the circulatory system: Secondary | ICD-10-CM

## 2024-01-12 DIAGNOSIS — Z01419 Encounter for gynecological examination (general) (routine) without abnormal findings: Secondary | ICD-10-CM | POA: Diagnosis not present

## 2024-01-12 NOTE — Progress Notes (Addendum)
 89 y.o. G26P1001 Widowed Caucasian female here for a breast and pelvic exam. Pt has noticed vaginal odor and some leakage in general.   She is uncertain if the drainage is coming from the vagina or from the bladder.  She wants evaluation of the odor today for this.   Wears a pad and has some urinary incontinence if she does not get to the bathroom on time.  She states that this is pretty much under control.   Denies pain with urination.  Denies blood in her urine.  No urgency unless her bladder is full.  No odor with urination.   No history of UTIs.     She state her mons pubis area is larger and she has concerns about this.   Dealing with emphysema and elevated blood pressure.  She is retaining fluid in her legs.  She has known atrial fibrillation and takes Eliquis.  States she has had cardioversion.  Reports she took medication in the past, which she did not tolerate.  She is going to contact her PCP today about an appointment.   Her daugter in law brought her to the visit today.   PCP: Burton Apley, MD   Patient's last menstrual period was 11/03/1980 (approximate).           Sexually active: No.  The current method of family planning is status post hysterectomy.    Menopausal hormone therapy:  n/a Exercising: No.   Smoker:  no  OB History     Gravida  1   Para  1   Term  1   Preterm  0   AB  0   Living  1      SAB  0   IAB  0   Ectopic  0   Multiple  0   Live Births  1           HEALTH MAINTENANCE: Last 2 paps: 2002 neg History of abnormal Pap or positive HPV:  no Mammogram:  04/15/22 Breast Density Cat C, BI-RADS CAT 1 neg.  She declines continuation of mammograms.  Colonoscopy:  2004, cannot swallow prep Bone Density:  2018  Result  normal   Immunization History  Administered Date(s) Administered   Fluad Quad(high Dose 65+) 09/08/2019   Influenza-Unspecified 09/12/2017   Tdap 11/04/2011      reports that she has quit smoking.  Her smoking use included cigarettes. She has a 15 pack-year smoking history. She has never used smokeless tobacco. She reports current alcohol use of about 3.0 standard drinks of alcohol per week. She reports that she does not use drugs.  Past Medical History:  Diagnosis Date   Asthmatic bronchitis    Atrial fibrillation (HCC) 2019   Cataracts, bilateral    immature   Chronic back pain    arthritis.Stenosis   Dysphagia    Dyspnea    with exertion   Dysrhythmia    (?or extra beats) treated /w atenolol   Emphysema lung (HCC)    left   GERD (gastroesophageal reflux disease)    takes Omeprazole daily   Hard of hearing    wears hearing aides    History of blood transfusion    no abnormal reaction noted   History of gastric ulcer    Hypercholesteremia    takes Atorvastatin daily   Hypertension    takes Coreg and Losartan daily   Hypothyroidism    takes Synthroid daily   IBS (irritable bowel syndrome)  takes Librax daily   Joint pain    Lumbosacral spondylosis without myelopathy    Melanoma (HCC)    on left leg, wide excision    Melanoma (HCC) 2022   abdomen   Muscle spasm    takes Robaxin daily as needed   Neuromuscular disorder (HCC)    esophageal spasms at times    Osteoarthritis    Peripheral edema    take HCTZ daily   Pneumonia    hx of.Many yrs ago   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   developed into pneumonia, resolved within a year   Seasonal allergies    takes Allegra daily and uses Flonase daily as needed   Venous stasis    Yeast infection    given Diflucan today    Past Surgical History:  Procedure Laterality Date   ABDOMINAL HYSTERECTOMY  1982   BSO   ANTERIOR CERVICAL DECOMP/DISCECTOMY FUSION N/A 10/03/2014   Procedure: ANTERIOR CERVICAL DECOMPRESSION/DISCECTOMY FUSION 1 LEVEL,CERVICAL THREE-FOUR;  Surgeon: Barnett Abu, MD;  Location: [facility];  Service: Neurosurgery;  Laterality: N/A;   APPENDECTOMY  1986   along with chole   BREAST BIOPSY  Right 1997   sclerosing adenosis -Dr. Ezzard Standing   CARDIOVERSION N/A 04/29/2019   Procedure: CARDIOVERSION;  Surgeon: Lewayne Bunting, MD;  Location: [facility] ENDOSCOPY;  Service: Cardiovascular;  Laterality: N/A;   CHOLECYSTECTOMY     COLONOSCOPY     ESOPHAGOGASTRODUODENOSCOPY     with dilitation   LIPOMA EXCISION Left 10/1999   left thigh   lumbosacral cyst  2005   seen on injection for low back pain    LUNG BIOPSY  05/2001   /w Dr.Burney- bronchoscopy, mediastinoscopy   MELANOMA EXCISION     REVERSE SHOULDER ARTHROPLASTY Left 02/01/2016   Procedure: LEFT REVERSE TOTAL SHOULDER ARTHROPLASTY;  Surgeon: Beverely Low, MD;  Location: [facility] OR;  Service: Orthopedics;  Laterality: Left;   REVERSE SHOULDER ARTHROPLASTY Right 01/03/2022   Procedure: REVERSE SHOULDER ARTHROPLASTY;  Surgeon: Beverely Low, MD;  Location: [facility] ORS;  Service: Orthopedics;  Laterality: Right;  NO ISB   rotator cuff  Bilateral 1996/1998   repair   ROTATOR CUFF REPAIR Left 1996; 1981   ROTATOR CUFF REPAIR Right 1996   TONSILLECTOMY     TOTAL HIP ARTHROPLASTY Left 10/2000   TOTAL HIP ARTHROPLASTY Right 06/2005   TOTAL HIP ARTHROPLASTY Left    10/2000   VAGINAL DELIVERY     x1    Current Outpatient Medications  Medication Sig Dispense Refill   acetaminophen (TYLENOL) 500 MG tablet Take 500-1,000 mg by mouth every 4 (four) hours as needed for moderate pain.     albuterol (PROVENTIL HFA;VENTOLIN HFA) 108 (90 Base) MCG/ACT inhaler Inhale 2 puffs into the lungs every 6 (six) hours as needed for wheezing or shortness of breath.     apixaban (ELIQUIS) 2.5 MG TABS tablet Take 1 tablet (2.5 mg total) by mouth 2 (two) times daily. 180 tablet 3   atorvastatin (LIPITOR) 20 MG tablet Take 20 mg by mouth daily.     Biotin 5000 MCG TABS Take 5,000 mcg by mouth daily.     Calcium Carbonate-Vitamin D (CALCIUM 600+D PO) Take 1 tablet by mouth daily.     carvedilol (COREG) 12.5 MG tablet Take 1 tablet (12.5 mg total) by mouth 2 (two) times  daily with a meal. 180 tablet 3   cholecalciferol (VITAMIN D) 1000 UNITS tablet Take 1,000 Units by mouth daily.     diazepam (VALIUM) 5  MG tablet Take 2.5 mg by mouth at bedtime as needed for sedation.      fluticasone (FLONASE) 50 MCG/ACT nasal spray Place 2 sprays into both nostrils daily as needed for allergies.     furosemide (LASIX) 20 MG tablet Take 20 mg by mouth daily as needed for edema.     hyoscyamine (LEVSIN SL) 0.125 MG SL tablet Place 0.125 mg under the tongue every 4 (four) hours as needed for cramping.      levothyroxine (SYNTHROID, LEVOTHROID) 112 MCG tablet Take 112 mcg by mouth daily before breakfast.     losartan (COZAAR) 100 MG tablet Take 1 tablet (100 mg total) by mouth daily. 90 tablet 3   methocarbamol (ROBAXIN) 500 MG tablet methocarbamol 500 mg tablet  Take 1 tablet 4 times a day by oral route.     omeprazole (PRILOSEC) 40 MG capsule Take 40 mg by mouth daily.     Probiotic Product (ALIGN) 4 MG CAPS Take 4 mg by mouth daily.     sertraline (ZOLOFT) 50 MG tablet Take 50 mg by mouth daily.     No current facility-administered medications for this visit.    ALLERGIES: Pseudoephedrine hcl, Ultram [tramadol], Vicodin [hydrocodone-acetaminophen], Codeine, Dalmane [flurazepam hcl], Nortriptyline, Propoxyphene, Cephalexin, Clarithromycin, Levofloxacin, and Vibramycin [doxycycline calcium]  Family History  Problem Relation Age of Onset   Stroke Mother    Heart attack Father    Diabetes Brother    Heart disease Brother     Review of Systems  All other systems reviewed and are negative.   PHYSICAL EXAM:  BP 132/76 (BP Location: Left Arm, Patient Position: Sitting, Cuff Size: Small)   Ht 5\' 2"  (1.575 m)   Wt 130 lb (59 kg)   LMP 11/03/1980 (Approximate)   BMI 23.78 kg/m     General appearance: alert, cooperative and appears stated age Head: normocephalic, without obvious abnormality, atraumatic Neck: no adenopathy, supple, symmetrical, trachea midline and  thyroid normal to inspection and palpation Lungs: clear to auscultation bilaterally Breasts: normal appearance, no masses or tenderness, bilateral nipple inversion (old change), No nipple discharge or bleeding, No axillary adenopathy Heart:  irregularly irregular.  No tachycardia. Abdomen: soft, non-tender; no masses, no organomegaly Extremities: extremities normal, atraumatic, no cyanosis. Left lower edema greater and right lower extremity edema.   Skin: skin color, texture, turgor normal. No rashes or lesions Lymph nodes: cervical, supraclavicular, and axillary nodes normal. Neurologic: grossly normal  Pelvic: External genitalia:  no lesions              No abnormal inguinal nodes palpated.              Urethra:  normal appearing urethra with no masses, tenderness or lesions              Bartholins and Skenes: normal                 Vagina: normal appearing vagina with normal color and discharge, no lesions              Cervix:absent              Pap taken: No. Bimanual Exam:  Uterus:  absent              Adnexa: no mass, fullness, tenderness              Rectal exam: yes.  Confirms.              Anus:  normal sphincter tone,  no lesions  Chaperone was present for exam:  Warren Lacy, CMA  ASSESSMENT: Encounter for breast and pelvic exam.  Status post TAH/BSO.  Vaginal odor.  Health education counseling.  Atrial fibrillation.  On Eliquis. Bilateral nipple inversion.  Old change.   PLAN: Mammogram screening discussed.  She declines further mammograms.   Self breast awareness reviewed. Pap and HRV collected:  no.  Not indicated.  Nuswab vaginitis testing collected today.  Treatment recommendations will follow after results are back.  We talked about Depends pulls up and placed a pad inside the Depends for freshness.  Guidelines for Calcium, Vitamin D, regular exercise program including cardiovascular and weight bearing exercise. Medication refills:  NA She will contact her PCP  today for an appointment.  Follow up:  2 years and prn.    Additional counseling given.  yes. 30 min total time was spent for this patient encounter, including preparation, face-to-face counseling with the patient, coordination of care, and documentation of the encounter in addition to doing the breast and pelvic exam.

## 2024-01-12 NOTE — Patient Instructions (Signed)

## 2024-01-14 LAB — CERVICOVAGINAL ANCILLARY ONLY
Bacterial Vaginitis (gardnerella): NEGATIVE
Candida Glabrata: NEGATIVE
Candida Vaginitis: NEGATIVE
Comment: NEGATIVE
Comment: NEGATIVE
Comment: NEGATIVE
Comment: NEGATIVE
Trichomonas: NEGATIVE

## 2024-01-15 ENCOUNTER — Encounter: Payer: Self-pay | Admitting: Obstetrics and Gynecology

## 2024-01-27 ENCOUNTER — Encounter: Payer: Medicare Other | Admitting: Radiology

## 2024-07-14 ENCOUNTER — Encounter: Payer: Self-pay | Admitting: Cardiology

## 2024-10-20 ENCOUNTER — Other Ambulatory Visit: Payer: Self-pay

## 2024-10-20 ENCOUNTER — Ambulatory Visit
Admission: EM | Admit: 2024-10-20 | Discharge: 2024-10-20 | Disposition: A | Discharge status: Home / Self Care | Attending: Emergency Medicine | Admitting: Emergency Medicine

## 2024-10-20 DIAGNOSIS — R300 Dysuria: Secondary | ICD-10-CM | POA: Diagnosis not present

## 2024-10-20 DIAGNOSIS — R6883 Chills (without fever): Secondary | ICD-10-CM | POA: Diagnosis not present

## 2024-10-20 DIAGNOSIS — R6889 Other general symptoms and signs: Secondary | ICD-10-CM | POA: Diagnosis not present

## 2024-10-20 LAB — POC COVID19/FLU A&B COMBO
Covid Antigen, POC: NEGATIVE
Influenza A Antigen, POC: NEGATIVE
Influenza B Antigen, POC: NEGATIVE

## 2024-10-20 NOTE — ED Notes (Signed)
 Pt is currently on toilet at this time. This RN has checked in on patient approximately four times. States she is okay. Just needing some time with the sink running.

## 2024-10-20 NOTE — ED Notes (Signed)
 Patient is still on the toilet and has stated she is okay.

## 2024-10-20 NOTE — ED Notes (Signed)
 Moved to room 1 due to unsteady gait walking to the bathroom.

## 2024-10-20 NOTE — ED Triage Notes (Signed)
 Pt presents with complaints of chills and dysuria x 5 days. States it burns to urinate. Feels more tired than usual. No pain at this time. OTC Tylenol  taken at home. Pt goes on to share that she took the Tylenol  for a left-sided headache. [place] approximately one week ago. Hit the left side of head. Denies headache at this time.

## 2024-10-20 NOTE — Discharge Instructions (Addendum)
 Covid and flu testing were negative, you may have a different viral illness  We are unable to get a urine sample today, this would help us  determine if you have a urinary tract infection  If you continue to have dysuria please return to clinic, hydrated enough to give us  a urine sample or use the hat and cup we gave you. Please return tomorrow with the sample, you can go to [facility] with the sample.   You are on Eliquis , a blood thinner. If you fall and hit your head you need to call 911 or go to the nearest emergency department as you are at high risk for life-threatening bleeding in the brain due to your medication and age.

## 2024-10-20 NOTE — ED Notes (Signed)
 Patient still on toilet at this time. Patient states she is 'okay'.

## 2024-10-20 NOTE — ED Provider Notes (Signed)
 [facility]    CSN: [id number] Arrival date & time: 10/20/24  1326      History   Chief Complaint Chief Complaint  Patient presents with   Chills   Dysuria    HPI Jenna [name] is a 89 y.o. female.   Brought in by her son who waits in the lobby. Patient provides history and seems forgetful.   Patient reports for the past 5-6 days she has not been feeling well and has been having chills  Daughter-in-law concerned about UTI Patient reports sometimes hard to urinate  Does not think she has had urinary symptoms but from time to time will have stinging sensation  Denies fevers or cough (has normal cough, nothing increased from baseline) Denies sore throat or nasal congestion / rhinorrhea  Has not felt like she should and feels tired   Less than a week ago she had a fall in [employer]. She was bending down to pick something up and slipped, fell into the wall and was able to catch herself, did hit her head on the wall.   The history is provided by the patient and medical records.  Dysuria   Past Medical History:  Diagnosis Date   Asthmatic bronchitis    Atrial fibrillation (HCC) 2019   Cataracts, bilateral    immature   Chronic back pain    arthritis.Stenosis   Dysphagia    Dyspnea    with exertion   Dysrhythmia    (?or extra beats) treated /w atenolol    Emphysema lung (HCC)    left   GERD (gastroesophageal reflux disease)    takes Omeprazole daily   Hard of hearing    wears hearing aides    History of blood transfusion    no abnormal reaction noted   History of gastric ulcer    Hypercholesteremia    takes Atorvastatin  daily   Hypertension    takes Coreg  and Losartan  daily   Hypothyroidism    takes Synthroid  daily   IBS (irritable bowel syndrome)    takes Librax  daily   Joint pain    Lumbosacral spondylosis without myelopathy    Melanoma (HCC)    on left leg, wide excision    Melanoma (HCC) 2022   abdomen   Muscle spasm    takes Robaxin   daily as needed   Neuromuscular disorder (HCC)    esophageal spasms at times    Osteoarthritis    Peripheral edema    take HCTZ daily   Pneumonia    hx of.Many yrs ago   Sarcoidosis of lung 05/2001   developed into pneumonia, resolved within a year   Seasonal allergies    takes Allegra daily and uses Flonase  daily as needed   Venous stasis    Yeast infection    given Diflucan  today    Patient Active Problem List   Diagnosis Date Noted   S/P shoulder replacement, right 01/03/2022   Medication side effects 08/25/2019   Fatigue 06/01/2019   PAF (paroxysmal atrial fibrillation) (HCC) 05/12/2019   Chronic anticoagulation 05/12/2019   Essential hypertension 05/12/2019   Hypothyroidism 05/12/2019   Lumbosacral spondylosis without myelopathy 01/23/2017   S/P shoulder replacement 02/01/2016   Cervical spondylosis with myelopathy 10/03/2014   Spondylosis of cervical joint 07/07/2012   Chronic pain 02/10/2012   Rotator cuff tear arthropathy of both shoulders 02/10/2012   Lumbar spondylosis 02/10/2012    Past Surgical History:  Procedure Laterality Date   ABDOMINAL HYSTERECTOMY  1982   BSO  ANTERIOR CERVICAL DECOMP/DISCECTOMY FUSION N/A 10/03/2014   Procedure: ANTERIOR CERVICAL DECOMPRESSION/DISCECTOMY FUSION 1 LEVEL,CERVICAL THREE-FOUR;  Surgeon: Victory Gens, MD;  Location: [facility] NEUR[facility]S;  Service: Neurosurgery;  Laterality: N/A;   APPENDECTOMY  1986   along with chole   BREAST BIOPSY Right 1997   sclerosing adenosis -Dr. Ethyl   CARDIOVERSION N/A 04/29/2019   Procedure: CARDIOVERSION;  Surgeon: Pietro Redell RAMAN, MD;  Location: [facility] ENDOSCOPY;  Service: Cardiovascular;  Laterality: N/A;   CHOLECYSTECTOMY     COLONOSCOPY     ESOPHAGOGASTRODUODENOSCOPY     with dilitation   LIPOMA EXCISION Left 10/1999   left thigh   lumbosacral cyst  2005   seen on injection for low back pain    LUNG BIOPSY  05/2001   /w Dr.Burney- bronchoscopy, mediastinoscopy   MELANOMA EXCISION      REVERSE SHOULDER ARTHROPLASTY Left 02/01/2016   Procedure: LEFT REVERSE TOTAL SHOULDER ARTHROPLASTY;  Surgeon: Marcey Her, MD;  Location: [facility] OR;  Service: Orthopedics;  Laterality: Left;   REVERSE SHOULDER ARTHROPLASTY Right 01/03/2022   Procedure: REVERSE SHOULDER ARTHROPLASTY;  Surgeon: Her Marcey, MD;  Location: [facility] ORS;  Service: Orthopedics;  Laterality: Right;  NO ISB   rotator cuff  Bilateral 1996/1998   repair   ROTATOR CUFF REPAIR Left 1996; 1981   ROTATOR CUFF REPAIR Right 1996   TONSILLECTOMY     TOTAL HIP ARTHROPLASTY Left 10/2000   TOTAL HIP ARTHROPLASTY Right 06/2005   TOTAL HIP ARTHROPLASTY Left    10/2000   VAGINAL DELIVERY     x1    OB History     Gravida  1   Para  1   Term  1   Preterm  0   AB  0   Living  1      SAB  0   IAB  0   Ectopic  0   Multiple  0   Live Births  1            Home Medications    Prior to Admission medications  Medication Sig Start Date End Date Taking? Authorizing Provider  acetaminophen  (TYLENOL ) 500 MG tablet Take 500-1,000 mg by mouth every 4 (four) hours as needed for moderate pain.    [provider]  albuterol  (PROVENTIL  HFA;VENTOLIN  HFA) 108 (90 Base) MCG/ACT inhaler Inhale 2 puffs into the lungs every 6 (six) hours as needed for wheezing or shortness of breath.    [provider]  apixaban  (ELIQUIS ) 2.5 MG TABS tablet Take 1 tablet (2.5 mg total) by mouth 2 (two) times daily. 07/14/22   Jordan, Peter M, MD  atorvastatin  (LIPITOR) 20 MG tablet Take 20 mg by mouth daily.    [provider]  Biotin  5000 MCG TABS Take 5,000 mcg by mouth daily.    [provider]  Calcium  Carbonate-Vitamin D  (CALCIUM  600+D PO) Take 1 tablet by mouth daily.    [provider]  carvedilol  (COREG ) 12.5 MG tablet Take 1 tablet (12.5 mg total) by mouth 2 (two) times daily with a meal. 07/14/22   Jordan, Peter M, MD  cholecalciferol  (VITAMIN D ) 1000 UNITS tablet Take 1,000 Units by mouth  daily.    [provider]  diazepam  (VALIUM ) 5 MG tablet Take 2.5 mg by mouth at bedtime as needed for sedation.  02/05/15   [provider]  fluticasone  (FLONASE ) 50 MCG/ACT nasal spray Place 2 sprays into both nostrils daily as needed for allergies.    [provider]  furosemide  (LASIX ) 20 MG tablet Take 20 mg by mouth daily as needed for edema.    [provider]  hyoscyamine  (LEVSIN  SL) 0.125 MG SL tablet Place 0.125 mg under the tongue every 4 (four) hours as needed for cramping.     [provider]  levothyroxine  (SYNTHROID , LEVOTHROID) 112 MCG tablet Take 112 mcg by mouth daily before breakfast.    [provider]  losartan  (COZAAR ) 100 MG tablet Take 1 tablet (100 mg total) by mouth daily. 07/14/22   Jordan, Peter M, MD  methocarbamol  (ROBAXIN ) 500 MG tablet methocarbamol  500 mg tablet  Take 1 tablet 4 times a day by oral route.    [provider]  omeprazole (PRILOSEC) 40 MG capsule Take 40 mg by mouth daily.    [provider]  Probiotic Product (ALIGN) 4 MG CAPS Take 4 mg by mouth daily.    [provider]  sertraline  (ZOLOFT ) 50 MG tablet Take 50 mg by mouth daily. 06/22/20   [provider]    Family History Family History  Problem Relation Age of Onset   Stroke Mother    Heart attack Father    Diabetes Brother    Heart disease Brother     Social History Social History[1]   Allergies   Pseudoephedrine hcl, Ultram [tramadol], Vicodin [hydrocodone -acetaminophen ], Codeine, Dalmane [flurazepam hcl], Nortriptyline , Propoxyphene, Cephalexin, Clarithromycin, Levofloxacin, and Vibramycin [doxycycline calcium ]   Review of Systems Review of Systems  Per HPI  Physical Exam Triage Vital Signs ED Triage Vitals  Encounter Vitals Group     BP 10/20/24 1403 98/78     Girls Systolic BP Percentile --      Girls Diastolic BP Percentile --      Boys Systolic BP Percentile --      Boys Diastolic  BP Percentile --      Pulse Rate 10/20/24 1403 100     Resp 10/20/24 1403 15     Temp 10/20/24 1403 98.6 F (37 C)     Temp src --      SpO2 10/20/24 1403 95 %     Weight --      Height 10/20/24 1411 5' 2 (1.575 m)     Head Circumference --      Peak Flow --      Pain Score 10/20/24 1411 0     Pain Loc --      Pain Education --      Exclude from Growth Chart --    No data found.  Updated Vital Signs BP (!) 151/90 (BP Location: Right Arm)   Pulse 100   Temp 98.6 F (37 C)   Resp 15   Ht 5' 2 (1.575 m)   LMP 11/03/1980   SpO2 95%   BMI 23.78 kg/m   Visual Acuity Right Eye Distance:   Left Eye Distance:   Bilateral Distance:    Right Eye Near:   Left Eye Near:    Bilateral Near:     Physical Exam Vitals and nursing note reviewed.  Constitutional:      Appearance: Normal appearance.  HENT:     Head: Normocephalic and atraumatic.     Right Ear: External ear normal.     Left Ear: External ear normal.     Nose: Nose normal.     Mouth/Throat:     Mouth: Mucous membranes are moist.  Eyes:     Conjunctiva/sclera: Conjunctivae normal.  Cardiovascular:     Rate and Rhythm: Normal  rate. Rhythm irregular.     Heart sounds: Normal heart sounds. No murmur heard. Pulmonary:     Effort: Pulmonary effort is normal. No respiratory distress.     Breath sounds: No wheezing.  Abdominal:     Tenderness: There is no right CVA tenderness or left CVA tenderness.  Skin:    General: Skin is warm and dry.  Neurological:     General: No focal deficit present.     Mental Status: She is alert and oriented to person, place, and time.  Psychiatric:        Mood and Affect: Mood normal.        Behavior: Behavior normal.      UC Treatments / Results  Labs (all labs ordered are listed, but only abnormal results are displayed) Labs Reviewed  POC COVID19/FLU A&B COMBO  POCT URINE DIPSTICK    EKG   Radiology No results found.  Procedures Procedures (including critical  care time)  Medications Ordered in UC Medications - No data to display  Initial Impression / Assessment and Plan / UC Course  I have reviewed the triage vital signs and the nursing notes.  Pertinent labs & imaging results that were available during my care of the patient were reviewed by me and considered in my medical decision making (see chart for details).  Vitals and triage reviewed, patient is hemodynamically stable.  Lungs vesicular.  Heart with irregular rate and rhythm, history of A-fib.  Unable to provide urine sample in clinic despite multiple cups of water and over 2 hours of attempts.  Provided with hat and cup and encouraged to return today or tomorrow with fresh urine sample.  Patient does have an issue with transport.   Negative for CVA tenderness and afebrile, low clinical suspicion for pyelonephritis at this time.  Advised son and patient that we would need the urine sample to rule in or out urinary tract infection.  Plan of care, follow-up care return precautions given, no questions at this time.     Final Clinical Impressions(s) / UC Diagnoses   Final diagnoses:  Chills  Dysuria  Feeling unwell     Discharge Instructions      Covid and flu testing were negative, you may have a different viral illness  We are unable to get a urine sample today, this would help us  determine if you have a urinary tract infection  If you continue to have dysuria please return to clinic, hydrated enough to give us  a urine sample or use the hat and cup we gave you. Please return tomorrow with the sample, you can go to [facility] with the sample.   You are on Eliquis , a blood thinner. If you fall and hit your head you need to call 911 or go to the nearest emergency department as you are at high risk for life-threatening bleeding in the brain due to your medication and age.     ED Prescriptions   None    PDMP not reviewed this encounter.     [1]  Social  History Tobacco Use   Smoking status: Former    Current packs/day: 1.00    Average packs/day: 1 pack/day for 15.0 years (15.0 ttl pk-yrs)    Types: Cigarettes   Smokeless tobacco: Never   Tobacco comments:    quit smoking in 1985  Vaping Use   Vaping status: Never Used  Substance Use Topics   Alcohol  use: Yes    Alcohol /week: 3.0 standard drinks  of alcohol     Types: 3 Glasses of wine per week    Comment: occ wine   Drug use: No     Dreama Carmela SAILOR, FNP 10/20/24 1610

## 2024-10-20 NOTE — ED Notes (Signed)
 Unable to obtain urine sample. Pt is now sitting in room one. [place]  NP is aware.

## 2024-12-08 ENCOUNTER — Ambulatory Visit: Admitting: Cardiology
